# Patient Record
Sex: Female | Born: 1960 | Race: White | Hispanic: No | Marital: Married | Smoking: Current every day smoker
Health system: Southern US, Community
[De-identification: ages and names within clinical notes are randomized; demographics above are authoritative.]

## PROBLEM LIST (undated history)

## (undated) DIAGNOSIS — C50919 Malignant neoplasm of unspecified site of unspecified female breast: Secondary | ICD-10-CM

## (undated) DIAGNOSIS — F32A Depression, unspecified: Secondary | ICD-10-CM

## (undated) DIAGNOSIS — F419 Anxiety disorder, unspecified: Secondary | ICD-10-CM

## (undated) DIAGNOSIS — IMO0002 Reserved for concepts with insufficient information to code with codable children: Secondary | ICD-10-CM

## (undated) DIAGNOSIS — F329 Major depressive disorder, single episode, unspecified: Secondary | ICD-10-CM

## (undated) DIAGNOSIS — M1990 Unspecified osteoarthritis, unspecified site: Secondary | ICD-10-CM

## (undated) DIAGNOSIS — E785 Hyperlipidemia, unspecified: Secondary | ICD-10-CM

## (undated) DIAGNOSIS — I10 Essential (primary) hypertension: Secondary | ICD-10-CM

## (undated) DIAGNOSIS — B019 Varicella without complication: Secondary | ICD-10-CM

## (undated) DIAGNOSIS — G4700 Insomnia, unspecified: Secondary | ICD-10-CM

## (undated) DIAGNOSIS — E282 Polycystic ovarian syndrome: Secondary | ICD-10-CM

## (undated) DIAGNOSIS — M8970 Major osseous defect, unspecified site: Secondary | ICD-10-CM

## (undated) DIAGNOSIS — G039 Meningitis, unspecified: Secondary | ICD-10-CM

## (undated) HISTORY — DX: Depression, unspecified: F32.A

## (undated) HISTORY — DX: Hyperlipidemia, unspecified: E78.5

## (undated) HISTORY — DX: Unspecified osteoarthritis, unspecified site: M19.90

## (undated) HISTORY — DX: Anxiety disorder, unspecified: F41.9

## (undated) HISTORY — DX: Insomnia, unspecified: G47.00

## (undated) HISTORY — DX: Meningitis, unspecified: G03.9

## (undated) HISTORY — DX: Polycystic ovarian syndrome: E28.2

## (undated) HISTORY — DX: Varicella without complication: B01.9

## (undated) HISTORY — DX: Essential (primary) hypertension: I10

## (undated) HISTORY — DX: Major osseous defect, unspecified site: M89.70

## (undated) HISTORY — DX: Reserved for concepts with insufficient information to code with codable children: IMO0002

## (undated) HISTORY — DX: Major depressive disorder, single episode, unspecified: F32.9

## (undated) HISTORY — DX: Malignant neoplasm of unspecified site of unspecified female breast: C50.919

---
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
## 1988-06-09 HISTORY — PX: DILATION AND EVACUATION: SHX1459

## 2003-06-10 HISTORY — PX: OTHER SURGICAL HISTORY: SHX169

## 2010-09-16 ENCOUNTER — Encounter (HOSPITAL_BASED_OUTPATIENT_CLINIC_OR_DEPARTMENT_OTHER): Payer: Self-pay

## 2010-09-16 ENCOUNTER — Ambulatory Visit (HOSPITAL_BASED_OUTPATIENT_CLINIC_OR_DEPARTMENT_OTHER): Payer: 59

## 2010-10-09 ENCOUNTER — Ambulatory Visit (HOSPITAL_BASED_OUTPATIENT_CLINIC_OR_DEPARTMENT_OTHER): Payer: 59

## 2011-05-29 ENCOUNTER — Ambulatory Visit
Admission: RE | Admit: 2011-05-29 | Discharge: 2011-05-29 | Disposition: A | Discharge status: Home / Self Care | Payer: 59 | Source: Ambulatory Visit | Attending: Family Medicine | Admitting: Family Medicine

## 2011-05-29 ENCOUNTER — Other Ambulatory Visit: Payer: Self-pay | Admitting: Family Medicine

## 2011-05-29 DIAGNOSIS — M25552 Pain in left hip: Secondary | ICD-10-CM

## 2011-06-25 ENCOUNTER — Other Ambulatory Visit (HOSPITAL_COMMUNITY): Payer: Self-pay | Admitting: Specialist

## 2011-06-25 DIAGNOSIS — M545 Low back pain, unspecified: Secondary | ICD-10-CM

## 2011-07-02 ENCOUNTER — Ambulatory Visit (HOSPITAL_COMMUNITY)
Admission: RE | Admit: 2011-07-02 | Discharge: 2011-07-02 | Disposition: A | Discharge status: Home / Self Care | Payer: 59 | Source: Ambulatory Visit | Attending: Specialist | Admitting: Specialist

## 2011-07-02 DIAGNOSIS — I621 Nontraumatic extradural hemorrhage: Secondary | ICD-10-CM | POA: Insufficient documentation

## 2011-07-02 DIAGNOSIS — M5126 Other intervertebral disc displacement, lumbar region: Secondary | ICD-10-CM | POA: Insufficient documentation

## 2011-07-02 DIAGNOSIS — M545 Low back pain: Secondary | ICD-10-CM

## 2011-07-24 ENCOUNTER — Ambulatory Visit: Payer: 59 | Attending: Specialist | Admitting: Rehabilitation

## 2011-07-24 DIAGNOSIS — M545 Low back pain, unspecified: Secondary | ICD-10-CM | POA: Insufficient documentation

## 2011-07-24 DIAGNOSIS — IMO0001 Reserved for inherently not codable concepts without codable children: Secondary | ICD-10-CM | POA: Insufficient documentation

## 2011-07-28 ENCOUNTER — Ambulatory Visit: Payer: 59 | Admitting: Physical Therapy

## 2011-07-30 ENCOUNTER — Ambulatory Visit: Payer: 59 | Admitting: Physical Therapy

## 2011-08-04 ENCOUNTER — Ambulatory Visit: Payer: 59 | Admitting: Physical Therapy

## 2011-08-06 ENCOUNTER — Ambulatory Visit: Payer: 59 | Admitting: Physical Therapy

## 2011-08-12 ENCOUNTER — Ambulatory Visit: Payer: 59 | Attending: Specialist | Admitting: Physical Therapy

## 2011-08-12 DIAGNOSIS — M2569 Stiffness of other specified joint, not elsewhere classified: Secondary | ICD-10-CM | POA: Insufficient documentation

## 2011-08-12 DIAGNOSIS — M545 Low back pain, unspecified: Secondary | ICD-10-CM | POA: Insufficient documentation

## 2011-08-12 DIAGNOSIS — IMO0001 Reserved for inherently not codable concepts without codable children: Secondary | ICD-10-CM | POA: Insufficient documentation

## 2011-08-14 ENCOUNTER — Ambulatory Visit: Payer: 59 | Admitting: Physical Therapy

## 2011-08-18 ENCOUNTER — Ambulatory Visit: Payer: 59 | Admitting: Physical Therapy

## 2011-08-21 ENCOUNTER — Ambulatory Visit: Payer: 59 | Admitting: Physical Therapy

## 2011-08-26 ENCOUNTER — Ambulatory Visit: Payer: 59 | Admitting: Physical Therapy

## 2011-08-28 ENCOUNTER — Ambulatory Visit: Payer: 59 | Admitting: Physical Therapy

## 2011-12-12 ENCOUNTER — Ambulatory Visit: Payer: 59 | Admitting: Family Medicine

## 2011-12-29 ENCOUNTER — Ambulatory Visit (INDEPENDENT_AMBULATORY_CARE_PROVIDER_SITE_OTHER): Payer: 59 | Admitting: Family Medicine

## 2011-12-29 ENCOUNTER — Encounter: Payer: Self-pay | Admitting: Family Medicine

## 2011-12-29 VITALS — BP 134/80 | HR 105 | Temp 98.7°F | Ht 66.0 in | Wt 283.2 lb

## 2011-12-29 DIAGNOSIS — Z720 Tobacco use: Secondary | ICD-10-CM

## 2011-12-29 DIAGNOSIS — E282 Polycystic ovarian syndrome: Secondary | ICD-10-CM | POA: Insufficient documentation

## 2011-12-29 DIAGNOSIS — F341 Dysthymic disorder: Secondary | ICD-10-CM

## 2011-12-29 DIAGNOSIS — F418 Other specified anxiety disorders: Secondary | ICD-10-CM

## 2011-12-29 DIAGNOSIS — F17200 Nicotine dependence, unspecified, uncomplicated: Secondary | ICD-10-CM

## 2011-12-29 DIAGNOSIS — L409 Psoriasis, unspecified: Secondary | ICD-10-CM | POA: Insufficient documentation

## 2011-12-29 DIAGNOSIS — E119 Type 2 diabetes mellitus without complications: Secondary | ICD-10-CM | POA: Insufficient documentation

## 2011-12-29 DIAGNOSIS — E785 Hyperlipidemia, unspecified: Secondary | ICD-10-CM

## 2011-12-29 DIAGNOSIS — L408 Other psoriasis: Secondary | ICD-10-CM

## 2011-12-29 DIAGNOSIS — I10 Essential (primary) hypertension: Secondary | ICD-10-CM | POA: Insufficient documentation

## 2011-12-29 DIAGNOSIS — G988 Other disorders of nervous system: Secondary | ICD-10-CM

## 2011-12-29 MED ORDER — SPIRONOLACTONE 25 MG PO TABS: 25.0000 mg | ORAL_TABLET | Freq: Every day | ORAL | Status: DC

## 2011-12-29 MED ORDER — METFORMIN HCL 500 MG PO TABS: 500.0000 mg | ORAL_TABLET | Freq: Two times a day (BID) | ORAL | Status: DC

## 2011-12-29 MED ORDER — LISINOPRIL 5 MG PO TABS: 5.0000 mg | ORAL_TABLET | Freq: Every day | ORAL | Status: DC

## 2011-12-29 MED ORDER — MELOXICAM 7.5 MG PO TABS: 7.5000 mg | ORAL_TABLET | ORAL | Status: DC | PRN

## 2011-12-29 NOTE — Patient Instructions (Addendum)
Schedule your complete physical in 3-4 months We'll notify you of your lab results and make any changes if needed Continue the Lisinopril at 2.5 mg daily Call and schedule your eye exam We'll notify you of your dermatology appt Call with any questions or concerns Think of [place] as your home base Welcome!!  We're glad to have you!

## 2011-12-29 NOTE — Progress Notes (Signed)
  Subjective:    Patient ID: Lisa [name], female    DOB: 1960-09-04, 51 y.o.   MRN: [id number]  HPI New to establish.  Previous MD- Dr Remi Haggard, [facility]), Dr Cyndia Bent.  Overdue on pap and mammo.  DM- chronic problem, A1C typically runs 6.6, fasting CBGs ~130.  On Metformin 1000mg  QHS.  Getting yearly eye exams, 'all have been normal'.  Denies symptomatic lows.  No CP, SOB, N/V/D.  Not exercising.  HTN- chronic problem, on Lisinopril and Spironolactone (also for facial hair).  dx'd in 2003.  BP is labile.  Was having episodes of hypotension on the 5 mg of Lisinopril.  Decreased to 2.5mg  daily.  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  Labs last checked 6-9 months ago.  Depression/anxiety- chronic problem, depression 'all my life'.  Anxiety just started when she moved from [place].  Is following w/ the [facility] in [place] and is on Cymbalta and Deplin.  Is rarely taking Xanax.  + insomnia.  Alternating ambien and temazepam.  Psoriasis- previously saw derm in [place], 'it never really panned out'.  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  Using OTC topical products.  Would like to see Derm locally.  Tobacco abuse- chronic problem, has quit twice while pregnant but still smoking heavily.  Failed Chantix and Wellbutrin.  Considering attending a clinic in [place].  Abnormal mole- pt reports shape has recently changed which has her concerned.   Review of Systems For ROS see HPI     Objective:   Physical Exam  Vitals reviewed. Constitutional: She is oriented to person, place, and time. She appears well-developed and well-nourished. No distress.  HENT:  Head: Normocephalic and atraumatic.  Eyes: Conjunctivae and EOM are normal. Pupils are equal, round, and reactive to light.  Neck: Normal range of motion. Neck supple. No thyromegaly present.    Cardiovascular: Normal rate, regular rhythm, normal heart sounds and intact distal pulses.   No murmur heard. Pulmonary/Chest: Effort normal and breath sounds normal. No respiratory distress.  Abdominal: Soft. She exhibits no distension. There is no tenderness.  Musculoskeletal: She exhibits no edema.  Lymphadenopathy:    She has no cervical adenopathy.  Neurological: She is alert and oriented to person, place, and time.  Skin: Skin is warm and dry. Rash (plaque psoriasis w/ excoriations on arms and legs) noted.  Psychiatric: She has a normal mood and affect. Her behavior is normal.          Assessment & Plan:

## 2011-12-30 ENCOUNTER — Telehealth: Payer: Self-pay | Admitting: *Deleted

## 2011-12-30 ENCOUNTER — Encounter: Payer: Self-pay | Admitting: Family Medicine

## 2011-12-30 DIAGNOSIS — Z720 Tobacco use: Secondary | ICD-10-CM | POA: Insufficient documentation

## 2011-12-30 LAB — LIPID PANEL
Cholesterol: 227 mg/dL — ABNORMAL HIGH (ref 0–200)
HDL: 33.6 mg/dL — ABNORMAL LOW (ref >39.00)

## 2011-12-30 LAB — HEPATIC FUNCTION PANEL
AST: 23 U/L (ref 0–37)
Albumin: 4 g/dL (ref 3.5–5.2)
Total Bilirubin: 0.3 mg/dL (ref 0.3–1.2)

## 2011-12-30 LAB — CBC WITH DIFFERENTIAL/PLATELET
Basophils Relative: 0.2 % (ref 0.0–3.0)
Eosinophils Absolute: 0.1 10*3/uL (ref 0.0–0.7)
Eosinophils Relative: 1.4 % (ref 0.0–5.0)
HCT: 43.2 % (ref 36.0–46.0)
Hemoglobin: 14.7 g/dL (ref 12.0–15.0)
Lymphs Abs: 2.1 10*3/uL (ref 0.7–4.0)
MCHC: 33.9 g/dL (ref 30.0–36.0)
MCV: 94.5 fl (ref 78.0–100.0)
Monocytes Absolute: 0.7 10*3/uL (ref 0.1–1.0)
Neutro Abs: 5.5 10*3/uL (ref 1.4–7.7)
RBC: 4.57 Mil/uL (ref 3.87–5.11)
WBC: 8.4 10*3/uL (ref 4.5–10.5)

## 2011-12-30 LAB — TSH: TSH: 1.12 u[IU]/mL (ref 0.35–5.50)

## 2011-12-30 LAB — BASIC METABOLIC PANEL
BUN: 13 mg/dL (ref 6–23)
Calcium: 9.2 mg/dL (ref 8.4–10.5)
Creatinine, Ser: 0.6 mg/dL (ref 0.4–1.2)
GFR: 109.63 mL/min (ref >60.00)
Glucose, Bld: 218 mg/dL — ABNORMAL HIGH (ref 70–99)
Potassium: 3.8 mEq/L (ref 3.5–5.1)

## 2011-12-30 NOTE — Assessment & Plan Note (Signed)
New to provider.  Following w/ clinic in [place].  Feels sxs are fairly well controlled.  Will follow along and assist as able.

## 2011-12-30 NOTE — Assessment & Plan Note (Signed)
New to provider.  Chronic for pt.  On metformin and spironolactone.

## 2011-12-30 NOTE — Assessment & Plan Note (Signed)
New to provider.  Chronic for pt.  Due to temporal bone defects.  At high risk for meningitis.  When ill requires early abx tx.

## 2011-12-30 NOTE — Assessment & Plan Note (Signed)
New to provider, chronic for pt.  Slightly elevated today but pt anxious about meeting new MD.  Was having episodes of hypotension on 5 mg.  Will follow.

## 2011-12-30 NOTE — Telephone Encounter (Signed)
Pt seen in office on yesterday and states that Dr Beverely Low was suppose to give her a name of a eye doctor..Please advise

## 2011-12-30 NOTE — Telephone Encounter (Signed)
Returned pt call to advise, pt understood

## 2011-12-30 NOTE — Telephone Encounter (Signed)
Dr Hazle Quant or Dr Dione Booze

## 2011-12-30 NOTE — Assessment & Plan Note (Signed)
New to provider.  Chronic problem for pt.  Reports hx of intolerance to statins.  Check labs.  Consider Zetia or Welchol depending on labs.

## 2011-12-30 NOTE — Assessment & Plan Note (Signed)
New to provider.  Chronic for pt.  Stressed importance of quitting.  Pt considering going to clinic in [place] to assist w/ this.

## 2011-12-30 NOTE — Assessment & Plan Note (Signed)
New to provider.  Chronic problem for pt.  On metformin- tolerating w/out difficulty.  Not exercising or following ADA diet.  Stressed importance of both.  Pt plans on scheduling eye exam.  Check labs and adjust meds prn.

## 2011-12-30 NOTE — Assessment & Plan Note (Signed)
New to provider, chronic for pt.  Will refer to derm as pt's sxs are severe.

## 2012-01-02 ENCOUNTER — Encounter: Payer: Self-pay | Admitting: *Deleted

## 2012-01-02 MED ORDER — METFORMIN HCL 1000 MG PO TABS: 1000.0000 mg | ORAL_TABLET | Freq: Two times a day (BID) | ORAL | Status: DC

## 2012-01-02 MED ORDER — FENOFIBRATE 160 MG PO TABS: 160.0000 mg | ORAL_TABLET | Freq: Every day | ORAL | Status: DC

## 2012-01-02 NOTE — Addendum Note (Signed)
Addended by: Derry Lory A on: 01/02/2012 11:56 AM   Modules accepted: Orders

## 2012-01-26 ENCOUNTER — Encounter: Payer: Self-pay | Admitting: Family Medicine

## 2012-02-02 ENCOUNTER — Telehealth: Payer: Self-pay | Admitting: Family Medicine

## 2012-02-02 NOTE — Telephone Encounter (Signed)
In reference to Dermatology referral entered on 12/29/11, per my call to patient in July, she would call [facility] & schedule her own appointment.  I have since contacted [facility] several times, and mailed patient a letter.  As of today, no appointment made with dermatology, and no response from patient.

## 2012-02-02 NOTE — Telephone Encounter (Signed)
Noted. Thanks for your efforts.

## 2012-03-05 ENCOUNTER — Encounter: Payer: Self-pay | Admitting: Family Medicine

## 2012-03-05 ENCOUNTER — Ambulatory Visit (INDEPENDENT_AMBULATORY_CARE_PROVIDER_SITE_OTHER): Payer: 59 | Admitting: Family Medicine

## 2012-03-05 ENCOUNTER — Other Ambulatory Visit: Payer: Self-pay | Admitting: Family Medicine

## 2012-03-05 VITALS — BP 132/80 | HR 90 | Temp 98.4°F | Ht 65.0 in | Wt 284.3 lb

## 2012-03-05 DIAGNOSIS — I10 Essential (primary) hypertension: Secondary | ICD-10-CM

## 2012-03-05 DIAGNOSIS — E785 Hyperlipidemia, unspecified: Secondary | ICD-10-CM

## 2012-03-05 DIAGNOSIS — R059 Cough, unspecified: Secondary | ICD-10-CM | POA: Insufficient documentation

## 2012-03-05 DIAGNOSIS — E119 Type 2 diabetes mellitus without complications: Secondary | ICD-10-CM

## 2012-03-05 DIAGNOSIS — R05 Cough: Secondary | ICD-10-CM

## 2012-03-05 LAB — CBC WITH DIFFERENTIAL/PLATELET
Basophils Absolute: 0 10*3/uL (ref 0.0–0.1)
Eosinophils Relative: 1 % (ref 0.0–5.0)
HCT: 43.9 % (ref 36.0–46.0)
Hemoglobin: 14.7 g/dL (ref 12.0–15.0)
Lymphs Abs: 2.1 10*3/uL (ref 0.7–4.0)
MCV: 93.6 fl (ref 78.0–100.0)
Monocytes Absolute: 0.8 10*3/uL (ref 0.1–1.0)
Monocytes Relative: 9.1 % (ref 3.0–12.0)
Neutro Abs: 5.3 10*3/uL (ref 1.4–7.7)
RDW: 13.8 % (ref 11.5–14.6)

## 2012-03-05 LAB — HEMOGLOBIN A1C: Hgb A1c MFr Bld: 7.3 % — ABNORMAL HIGH (ref 4.6–6.5)

## 2012-03-05 LAB — BASIC METABOLIC PANEL
CO2: 28 mEq/L (ref 19–32)
Calcium: 9.6 mg/dL (ref 8.4–10.5)
Creatinine, Ser: 0.7 mg/dL (ref 0.4–1.2)
Glucose, Bld: 106 mg/dL — ABNORMAL HIGH (ref 70–99)

## 2012-03-05 MED ORDER — METFORMIN HCL 1000 MG PO TABS: 1000.0000 mg | ORAL_TABLET | Freq: Two times a day (BID) | ORAL | Status: DC

## 2012-03-05 MED ORDER — GUAIFENESIN-CODEINE 100-10 MG/5ML PO SYRP: 10.0000 mL | ORAL_SOLUTION | Freq: Three times a day (TID) | ORAL | Status: DC | PRN

## 2012-03-05 NOTE — Telephone Encounter (Signed)
Pt would like refill sent into [facility]  Metformin HCL 1,000mg  tablet

## 2012-03-05 NOTE — Progress Notes (Signed)
  Subjective:    Patient ID: Lisa [name], female    DOB: 10-23-1960, 51 y.o.   MRN: [id number]  HPI HTN- chronic problem, adequate control on lisinopril, aldactone.  No CP, SOB, edema, N/V.  + HA due to CSF.  Hyperlipidemia- was started on Fenofibrate in July but pt reports this caused her to 'start leaking' (temporal bone defect).  Stopped meds.  DM- chronic problem, was not well controlled at last check, was increased to 1000mg  BID.  Has been taking 2000mg  BID.  Has not been checking sugars.  No symptomatic lows but 'i feel like crap.  Tired all the time'.    Cough/wheezing- pt continues to smoke, interested in quitting.  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  sxs started 1 week ago.  No fever.  Cough is worse at night.   Review of Systems For ROS see HPI     Objective:   Physical Exam  Vitals reviewed. Constitutional: She is oriented to person, place, and time. She appears well-developed and well-nourished. No distress.  HENT:  Head: Normocephalic and atraumatic.  Eyes: Conjunctivae normal and EOM are normal. Pupils are equal, round, and reactive to light.  Neck: Normal range of motion. Neck supple. No thyromegaly present.  Cardiovascular: Normal rate, regular rhythm, normal heart sounds and intact distal pulses.   No murmur heard. Pulmonary/Chest: Effort normal and breath sounds normal. No respiratory distress.  Abdominal: Soft. She exhibits no distension. There is no tenderness.  Musculoskeletal: She exhibits no edema.  Lymphadenopathy:    She has no cervical adenopathy.  Neurological: She is alert and oriented to person, place, and time.  Skin: Skin is warm and dry.  Psychiatric: She has a normal mood and affect. Her behavior is normal.          Assessment & Plan:

## 2012-03-05 NOTE — Telephone Encounter (Signed)
rx sent to pharmacy by e-script  

## 2012-03-05 NOTE — Patient Instructions (Addendum)
Schedule your complete physical in 3 months We'll notify you of your lab results and make any changes if needed Use the cough syrup- particularly at night- as needed If the cough worsens, please call so we can start antibiotics Call with any questions or concerns Hang in there!

## 2012-03-06 NOTE — Assessment & Plan Note (Signed)
New.  Suspect this is due to CSF leak and drainage down back of throat.  No evidence of infxn.  Start cough meds prn.  Reviewed supportive care and red flags that should prompt return.  Pt expressed understanding and is in agreement w/ plan.

## 2012-03-06 NOTE — Assessment & Plan Note (Signed)
Chronic problem, adequate control.  Asymptomatic w/ exception of HAs (which are due to low CSF).  No changes at this time.

## 2012-03-06 NOTE — Assessment & Plan Note (Signed)
Chronic problem.  Has been intolerant to statins and now fenofibrate b/c she reports this causes her CSF to leak.  Not sure how to proceed at this point- may refer to lipid clinic

## 2012-03-06 NOTE — Assessment & Plan Note (Signed)
Chronic problem.  Has not been taking meds properly but not checking sugars to know what this has done to her levels.  Check renal fxn due to excessive metformin.  Encouraged healthy diet- she states she is unable to exercise due to temporal bone defects.  Will check labs and determine what med changes are required.

## 2012-03-09 ENCOUNTER — Encounter: Payer: Self-pay | Admitting: *Deleted

## 2012-04-28 ENCOUNTER — Telehealth: Payer: Self-pay | Admitting: Family Medicine

## 2012-04-28 MED ORDER — LISINOPRIL 5 MG PO TABS: 5.0000 mg | ORAL_TABLET | Freq: Every day | ORAL | Status: DC

## 2012-04-28 NOTE — Telephone Encounter (Signed)
Refill: Lisinopril 5 mg tablet. Take 1 tablet by mouth daily. Qty 90. Last fill 12-29-11

## 2012-04-28 NOTE — Telephone Encounter (Signed)
Rx sent.    MW 

## 2012-05-19 ENCOUNTER — Other Ambulatory Visit (HOSPITAL_COMMUNITY)
Admission: RE | Admit: 2012-05-19 | Discharge: 2012-05-19 | Disposition: A | Discharge status: Home / Self Care | Payer: 59 | Source: Ambulatory Visit | Attending: Family Medicine | Admitting: Family Medicine

## 2012-05-19 ENCOUNTER — Ambulatory Visit (INDEPENDENT_AMBULATORY_CARE_PROVIDER_SITE_OTHER): Payer: 59 | Admitting: Family Medicine

## 2012-05-19 ENCOUNTER — Encounter: Payer: Self-pay | Admitting: Family Medicine

## 2012-05-19 VITALS — BP 120/70 | HR 89 | Temp 98.0°F | Ht 65.5 in | Wt 290.8 lb

## 2012-05-19 DIAGNOSIS — Z1211 Encounter for screening for malignant neoplasm of colon: Secondary | ICD-10-CM

## 2012-05-19 DIAGNOSIS — E785 Hyperlipidemia, unspecified: Secondary | ICD-10-CM

## 2012-05-19 DIAGNOSIS — Z1231 Encounter for screening mammogram for malignant neoplasm of breast: Secondary | ICD-10-CM

## 2012-05-19 DIAGNOSIS — E119 Type 2 diabetes mellitus without complications: Secondary | ICD-10-CM

## 2012-05-19 DIAGNOSIS — Z01419 Encounter for gynecological examination (general) (routine) without abnormal findings: Secondary | ICD-10-CM | POA: Insufficient documentation

## 2012-05-19 DIAGNOSIS — Z124 Encounter for screening for malignant neoplasm of cervix: Secondary | ICD-10-CM

## 2012-05-19 DIAGNOSIS — Z1239 Encounter for other screening for malignant neoplasm of breast: Secondary | ICD-10-CM

## 2012-05-19 LAB — HEPATIC FUNCTION PANEL
ALT: 28 U/L (ref 0–35)
AST: 27 U/L (ref 0–37)
Bilirubin, Direct: 0 mg/dL (ref 0.0–0.3)
Total Bilirubin: 0.3 mg/dL (ref 0.3–1.2)

## 2012-05-19 LAB — CBC WITH DIFFERENTIAL/PLATELET
Basophils Absolute: 0 10*3/uL (ref 0.0–0.1)
Eosinophils Absolute: 0.1 10*3/uL (ref 0.0–0.7)
Lymphocytes Relative: 24.1 % (ref 12.0–46.0)
MCHC: 34.8 g/dL (ref 30.0–36.0)
Neutrophils Relative %: 65.5 % (ref 43.0–77.0)
Platelets: 292 10*3/uL (ref 150.0–400.0)
RDW: 13.8 % (ref 11.5–14.6)

## 2012-05-19 LAB — LIPID PANEL
Cholesterol: 188 mg/dL (ref 0–200)
Total CHOL/HDL Ratio: 6
VLDL: 57 mg/dL — ABNORMAL HIGH (ref 0.0–40.0)

## 2012-05-19 LAB — BASIC METABOLIC PANEL
Chloride: 100 mEq/L (ref 96–112)
Creatinine, Ser: 0.7 mg/dL (ref 0.4–1.2)
Potassium: 3.8 mEq/L (ref 3.5–5.1)
Sodium: 134 mEq/L — ABNORMAL LOW (ref 135–145)

## 2012-05-19 NOTE — Assessment & Plan Note (Signed)
Pap collected. 

## 2012-05-19 NOTE — Patient Instructions (Addendum)
Follow up in 3 months to recheck diabetes We'll notify you of your lab results and make any changes if needed STOP SMOKING! Try and get regular exercise and make healthy food choices We'll call you with your mammo and GI appts Call with any questions or concerns Happy Bel[birth year] Birthday! Happy Holidays!!!

## 2012-05-19 NOTE — Assessment & Plan Note (Signed)
Pt's PE WNL w/ exception of obesity.  No growth noted on tongue- no blood drainage or breast mass noted.  Overdue for mammo, colonoscopy- refer for both.  Check labs.  Anticipatory guidance provided.

## 2012-05-19 NOTE — Progress Notes (Signed)
  Subjective:    Patient ID: Lisa [name], female    DOB: 1960/08/01, 51 y.o.   MRN: [id number]  HPI CPE- overdue on mammo.  Had bloody drainage from R nipple prior to last period in November.  No pain in breast.  Has never had colonoscopy- needs referral.   Review of Systems Patient reports no vision/ hearing changes, adenopathy,fever, weight change,  persistant/recurrent hoarseness , swallowing issues, chest pain, palpitations, edema, persistant/recurrent cough, hemoptysis, dyspnea (rest/exertional/paroxysmal nocturnal), gastrointestinal bleeding (melena, rectal bleeding), abdominal pain, significant heartburn, bowel changes, GU symptoms (dysuria, hematuria, incontinence), Gyn symptoms (abnormal  bleeding, pain),  syncope, focal weakness, memory loss, numbness & tingling, skin/hair/nail changes, abnormal bruising or bleeding, anxiety, or depression.     Objective:   Physical Exam  General Appearance:    Alert, cooperative, no distress, smells of cigarettes, obese  Head:    Normocephalic, without obvious abnormality, atraumatic  Eyes:    PERRL, conjunctiva/corneas clear, EOM's intact, fundi    benign, both eyes  Ears:    Normal TM's and external ear canals, both ears  Nose:   Nares normal, septum midline, mucosa normal, no drainage    or sinus tenderness  Throat:   Lips, mucosa, and tongue normal; teeth and gums normal  Neck:   Supple, symmetrical, trachea midline, no adenopathy;    Thyroid: no enlargement/tenderness/nodules  Back:     Symmetric, no curvature, ROM normal, no CVA tenderness  Lungs:     Clear to auscultation bilaterally, respirations unlabored  Chest Wall:    No tenderness or deformity   Heart:    Regular rate and rhythm, S1 and S2 normal, no murmur, rub   or gallop  Breast Exam:    No tenderness, masses, or nipple abnormality.  No bloody d/c noted  Abdomen:     Soft, non-tender, bowel sounds active all four quadrants,    no masses, no organomegaly  Genitalia:     External genitalia normal, cervix normal in appearance, no CMT, uterus in normal size and position, adnexa w/out mass or tenderness, mucosa pink and moist, no lesions or discharge present  Rectal:    Normal external appearance  Extremities:   Extremities normal, atraumatic, no cyanosis or edema  Pulses:   2+ and symmetric all extremities  Skin:   Skin color, texture, turgor normal, no rashes or lesions  Lymph nodes:   Cervical, supraclavicular, and axillary nodes normal  Neurologic:   CNII-XII intact, normal strength, sensation and reflexes    throughout          Assessment & Plan:

## 2012-05-19 NOTE — Assessment & Plan Note (Addendum)
Chronic problem.  Not currently on meds.  Check labs.  start meds prn

## 2012-05-19 NOTE — Assessment & Plan Note (Signed)
Chronic problem for pt.  A1C 6.9 today.  No med changes.  Encouraged healthy diet, regular exercise.

## 2012-05-21 ENCOUNTER — Encounter: Payer: Self-pay | Admitting: Internal Medicine

## 2012-05-24 ENCOUNTER — Encounter: Payer: Self-pay | Admitting: *Deleted

## 2012-06-10 ENCOUNTER — Ambulatory Visit (HOSPITAL_COMMUNITY)
Admission: RE | Admit: 2012-06-10 | Discharge: 2012-06-10 | Disposition: A | Discharge status: Home / Self Care | Payer: 59 | Source: Ambulatory Visit | Attending: Family Medicine | Admitting: Family Medicine

## 2012-06-10 DIAGNOSIS — Z1231 Encounter for screening mammogram for malignant neoplasm of breast: Secondary | ICD-10-CM | POA: Insufficient documentation

## 2012-06-10 DIAGNOSIS — Z1239 Encounter for other screening for malignant neoplasm of breast: Secondary | ICD-10-CM

## 2012-06-15 ENCOUNTER — Telehealth: Payer: Self-pay | Admitting: Family Medicine

## 2012-06-15 NOTE — Telephone Encounter (Signed)
Refill: Metformin hcl 1,000mg  tab. Take 1 tablet by mouth 2 times daily with a meal. Qty 180. Last fill 9.27.13

## 2012-06-16 MED ORDER — METFORMIN HCL 1000 MG PO TABS: 1000.0000 mg | ORAL_TABLET | Freq: Two times a day (BID) | ORAL | Status: DC

## 2012-06-16 NOTE — Telephone Encounter (Signed)
Rx sent to the pharmacy by e-script.//AB/CMA 

## 2012-06-18 ENCOUNTER — Telehealth: Payer: Self-pay | Admitting: Family Medicine

## 2012-06-18 ENCOUNTER — Ambulatory Visit (AMBULATORY_SURGERY_CENTER): Payer: 59 | Admitting: *Deleted

## 2012-06-18 VITALS — Ht 66.0 in | Wt 288.0 lb

## 2012-06-18 DIAGNOSIS — Z1211 Encounter for screening for malignant neoplasm of colon: Secondary | ICD-10-CM

## 2012-06-18 MED ORDER — MOVIPREP 100 G PO SOLR: ORAL | Status: DC

## 2012-06-18 MED ORDER — SPIRONOLACTONE 25 MG PO TABS: 25.0000 mg | ORAL_TABLET | Freq: Every day | ORAL | Status: DC

## 2012-06-18 NOTE — Telephone Encounter (Signed)
Refill: spironolactone 25 mg tablet. Take 1 tablet by mouth daily. Qty 90. Last fill 12-29-11

## 2012-06-18 NOTE — Telephone Encounter (Signed)
Refill done.  

## 2012-06-24 ENCOUNTER — Other Ambulatory Visit: Payer: Self-pay | Admitting: Family Medicine

## 2012-06-24 DIAGNOSIS — R928 Other abnormal and inconclusive findings on diagnostic imaging of breast: Secondary | ICD-10-CM

## 2012-07-01 ENCOUNTER — Ambulatory Visit (AMBULATORY_SURGERY_CENTER): Payer: 59 | Admitting: Internal Medicine

## 2012-07-01 ENCOUNTER — Encounter: Payer: Self-pay | Admitting: Internal Medicine

## 2012-07-01 VITALS — BP 122/75 | HR 77 | Temp 97.9°F | Resp 26 | Ht 66.0 in | Wt 288.0 lb

## 2012-07-01 DIAGNOSIS — D126 Benign neoplasm of colon, unspecified: Secondary | ICD-10-CM

## 2012-07-01 DIAGNOSIS — Z1211 Encounter for screening for malignant neoplasm of colon: Secondary | ICD-10-CM

## 2012-07-01 MED ORDER — SODIUM CHLORIDE 0.9 % IV SOLN: 500.0000 mL | INTRAVENOUS | Status: DC

## 2012-07-01 NOTE — Progress Notes (Signed)
Called to room to assist during endoscopic procedure.  Patient ID and intended procedure confirmed with present staff. Received instructions for my participation in the procedure from the performing physician.  

## 2012-07-01 NOTE — Progress Notes (Signed)
Lidocaine-40mg IV prior to Propofol InductionPropofol given over incremental dosages 

## 2012-07-01 NOTE — Progress Notes (Signed)
Patient did not experience any of the following events: a burn prior to discharge; a fall within the facility; wrong site/side/patient/procedure/implant event; or a hospital transfer or hospital admission upon discharge from the facility. (G8907) Patient did not have preoperative order for IV antibiotic SSI prophylaxis. (G8918)  

## 2012-07-01 NOTE — Patient Instructions (Addendum)

## 2012-07-01 NOTE — Op Note (Signed)
 [facility] [address] [place], [address]   COLONOSCOPY PROCEDURE REPORT  PATIENT: Lisa [name], Lisa [name]  MR#: [id number] BIRTHDATE: [birth year] , 51  yrs. old GENDER: Female ENDOSCOPIST: Beverley Fiedler, MD REFERRED [id number], Natalia Leatherwood PROCEDURE DATE:  07/01/2012 PROCEDURE:   Colonoscopy with snare polypectomy ASA CLASS:   Class III INDICATIONS:average risk screening and first colonoscopy. MEDICATIONS: MAC sedation, administered by CRNA and propofol (Diprivan) 500mg  IV  DESCRIPTION OF PROCEDURE:   After the risks benefits and alternatives of the procedure were thoroughly explained, informed consent was obtained.  A digital rectal exam revealed no rectal mass.   The LB CF-[id number]  endoscope was introduced through the anus and advanced to the cecum, which was identified by both the appendix and ileocecal valve. No adverse events experienced. The quality of the prep was Moviprep fair  The instrument was then slowly withdrawn as the colon was fully examined.   COLON FINDINGS: There was moderate diverticulosis noted in the ascending colon, transverse colon, descending colon, and sigmoid colon with associated muscular hypertrophy.   Four sessile polyps measuring 4-8 mm in size were found in the ascending colon, transverse colon, and sigmoid colon.  Polypectomy was performed using cold snare.  All resections were complete and all polyp tissue was completely retrieved.  Retroflexed views revealed no abnormalities. The time to cecum=15 minutes 56 seconds.  Withdrawal time=10 minutes 04 seconds.  The scope was withdrawn and the procedure completed. COMPLICATIONS: There were no complications.  ENDOSCOPIC IMPRESSION: 1.   There was moderate diverticulosis noted in the ascending colon, transverse colon, descending colon, and sigmoid colon 2.   Four sessile polyps measuring 4-8 mm in size were found in the ascending colon, transverse colon, and sigmoid colon;  Polypectomy was performed using cold snare  RECOMMENDATIONS: 1.  Hold aspirin, aspirin products, and anti-inflammatory medication for 1 week. 2.  High fiber diet 3.  If the polyps removed today are proven to be adenomatous (pre-cancerous) polyps, you will need a colonoscopy in 3 years. Otherwise you should continue to follow colorectal cancer screening guidelines for "routine risk" patients with a colonoscopy in 10 years.  You will receive a letter within 1-2 weeks with the results of your biopsy as well as final recommendations.  Please call my office if you have not received a letter after 3 weeks.   eSigned:  Beverley Fiedler, MD 07/01/2012 2:16 PM  cc: Neena Rhymes MD and The Patient

## 2012-07-02 ENCOUNTER — Telehealth: Payer: Self-pay | Admitting: *Deleted

## 2012-07-02 NOTE — Telephone Encounter (Signed)
Left message on number given in admitting yesterday. ewm 

## 2012-07-05 ENCOUNTER — Ambulatory Visit
Admission: RE | Admit: 2012-07-05 | Discharge: 2012-07-05 | Disposition: A | Discharge status: Home / Self Care | Payer: 59 | Source: Ambulatory Visit | Attending: Family Medicine | Admitting: Family Medicine

## 2012-07-05 DIAGNOSIS — R928 Other abnormal and inconclusive findings on diagnostic imaging of breast: Secondary | ICD-10-CM

## 2012-07-07 ENCOUNTER — Encounter: Payer: Self-pay | Admitting: Internal Medicine

## 2012-08-02 ENCOUNTER — Other Ambulatory Visit: Payer: Self-pay | Admitting: Family Medicine

## 2012-08-02 DIAGNOSIS — I10 Essential (primary) hypertension: Secondary | ICD-10-CM

## 2012-08-03 NOTE — Telephone Encounter (Signed)
Refill for lisinopril sent to [facility]

## 2012-09-27 ENCOUNTER — Encounter: Payer: Self-pay | Admitting: Internal Medicine

## 2012-09-27 ENCOUNTER — Ambulatory Visit (INDEPENDENT_AMBULATORY_CARE_PROVIDER_SITE_OTHER): Payer: 59 | Admitting: Internal Medicine

## 2012-09-27 VITALS — BP 122/80 | HR 100 | Temp 97.6°F | Ht 66.0 in | Wt 293.0 lb

## 2012-09-27 DIAGNOSIS — J209 Acute bronchitis, unspecified: Secondary | ICD-10-CM

## 2012-09-27 DIAGNOSIS — R05 Cough: Secondary | ICD-10-CM

## 2012-09-27 MED ORDER — ALBUTEROL SULFATE HFA 108 (90 BASE) MCG/ACT IN AERS: 2.0000 | INHALATION_SPRAY | Freq: Four times a day (QID) | RESPIRATORY_TRACT | Status: DC | PRN

## 2012-09-27 MED ORDER — LEVOFLOXACIN 500 MG PO TABS: 500.0000 mg | ORAL_TABLET | Freq: Every day | ORAL | Status: DC

## 2012-09-27 MED ORDER — HYDROCODONE-HOMATROPINE 5-1.5 MG/5ML PO SYRP: 5.0000 mL | ORAL_SOLUTION | Freq: Three times a day (TID) | ORAL | Status: DC | PRN

## 2012-09-27 NOTE — Progress Notes (Signed)
HPI  Pt presents to the clinic today with c/o cold symptoms x 1 weeks. The worst part is the sore throat and dry cough. She does not produce any sputum. She has tried Robitussin, Mucinex, cough drops and nothing seems to help. The cough is worse at night. She has not had much sleep in 3 nights. She does not have a history of allergies or asthma. She does smoke. She does have sick contacts.  Review of Systems      Past Medical History  Diagnosis Date  . Hypertension   . Diabetes mellitus   . Depression   . Arthritis   . Anxiety   . Chicken pox   . Hyperlipidemia   . Insomnia   . 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 Polycystic ovarian disease   . Bulging disc L-5    Family History  Problem Relation Age of Onset  . Arthritis Mother   . Hypertension Mother   . Arthritis Father   . Prostate cancer Father   . Heart disease Father   . Marfan syndrome Father   . Hyperlipidemia Sister   . Diabetes Sister   . Marfan syndrome Sister   . Breast cancer Maternal Grandmother   . Hypertension Maternal Grandmother   . Diabetes Paternal Grandmother     History   Social History  . Marital Status: Married    Spouse Name: N/A    Number of Children: N/A  . Years of Education: N/A   Occupational History  . Not on file.   Social History Main Topics  . Smoking status: Current Every Day Smoker -- 1.50 packs/day for 35 years  . Smokeless tobacco: Never Used  . Alcohol Use: No  . Drug Use: No  . Sexually Active: Not on file   Other Topics Concern  . Not on file   Social History Narrative  . No narrative on file    No Known Allergies   Constitutional: Positive headache, fatigue and fever. Denies abrupt weight changes.  HEENT:  Positive sore throat. Denies eye redness, eye pain, pressure behind the eyes, facial pain, nasal congestion, ear pain, ringing in the ears, wax buildup, runny nose or bloody nose. Respiratory: Positive cough. Denies  difficulty breathing or shortness of breath.  Cardiovascular: Denies chest pain, chest tightness, palpitations or swelling in the hands or feet.   No other specific complaints in a complete review of systems (except as listed in HPI above).  Objective:   BP 122/80  Pulse 100  Temp(Src) 97.6 F (36.4 C) (Oral)  Ht 5\' 6"  (1.676 m)  Wt 293 lb (132.904 kg)  BMI 47.31 kg/m2  SpO2 96% Wt Readings from Last 3 Encounters:  09/27/12 293 lb (132.904 kg)  07/01/12 288 lb (130.636 kg)  06/18/12 288 lb (130.636 kg)     General: Appears her stated age, obese but well developed, well nourished in NAD. HEENT: Head: normal shape and size; Eyes: sclera white, no icterus, conjunctiva pink, PERRLA and EOMs intact; Ears: Tm's gray and intact, normal light reflex; Nose: mucosa pink and moist, septum midline; Throat/Mouth: + PND. Teeth present, mucosa erythematous and moist, no exudate noted, no lesions or ulcerations noted.  Neck: Mild cervical lymphadenopathy. Neck supple, trachea midline. No massses, lumps or thyromegaly present.  Cardiovascular: Normal rate and rhythm. S1,S2 noted.  No murmur, rubs or gallops noted. No JVD or BLE edema. No carotid bruits noted. Pulmonary/Chest: Normal effort and scattered  ronchi in RUL. No respiratory distress. No wheezes, rales onoted.      Assessment & Plan:   Acute Bronchitis, new onset:  Get some rest and drink plenty of water Do salt water gargles for the sore throat eRx for Levaquin x 7 days eRx for abuterol inhaler eRx for hycodan cough syrup   RTC as needed or if symptoms persist.

## 2012-09-27 NOTE — Patient Instructions (Signed)

## 2012-09-29 DIAGNOSIS — Z0279 Encounter for issue of other medical certificate: Secondary | ICD-10-CM

## 2012-10-13 ENCOUNTER — Other Ambulatory Visit: Payer: Self-pay | Admitting: Family Medicine

## 2012-10-13 DIAGNOSIS — I10 Essential (primary) hypertension: Secondary | ICD-10-CM

## 2012-10-13 NOTE — Telephone Encounter (Signed)
Refill for Aldactone sent to [facility]

## 2012-10-21 ENCOUNTER — Encounter: Payer: Self-pay | Admitting: Family Medicine

## 2012-10-21 ENCOUNTER — Ambulatory Visit (INDEPENDENT_AMBULATORY_CARE_PROVIDER_SITE_OTHER): Payer: 59 | Admitting: Family Medicine

## 2012-10-21 VITALS — BP 110/70 | HR 87 | Temp 98.1°F | Ht 65.75 in | Wt 279.0 lb

## 2012-10-21 DIAGNOSIS — W57XXXA Bitten or stung by nonvenomous insect and other nonvenomous arthropods, initial encounter: Secondary | ICD-10-CM

## 2012-10-21 DIAGNOSIS — S30860A Insect bite (nonvenomous) of lower back and pelvis, initial encounter: Secondary | ICD-10-CM

## 2012-10-21 DIAGNOSIS — L0291 Cutaneous abscess, unspecified: Secondary | ICD-10-CM

## 2012-10-21 DIAGNOSIS — L0390 Cellulitis, unspecified: Secondary | ICD-10-CM | POA: Insufficient documentation

## 2012-10-21 MED ORDER — DOXYCYCLINE HYCLATE 100 MG PO TABS: 100.0000 mg | ORAL_TABLET | Freq: Two times a day (BID) | ORAL | Status: DC

## 2012-10-21 NOTE — Assessment & Plan Note (Signed)
New.  Check labs to r/o RMSF and Lyme.  Area now looks infected so will start Doxy.  Reviewed supportive care and red flags that should prompt return.  Pt expressed understanding and is in agreement w/ plan.

## 2012-10-21 NOTE — Patient Instructions (Addendum)
Schedule a diabetes and cholesterol f/u in the next month Start the Doxy twice daily- take w/ food Neosporin on the area twice daily We'll notify you of your lab results as soon as they are available Call with any questions or concerns Hang in there!

## 2012-10-21 NOTE — Progress Notes (Signed)
  Subjective:    Patient ID: Lisa [name], female    DOB: 07/29/1960, 52 y.o.   MRN: [id number]  HPI Tick bite- occurred on Saturday, tick was tiny and not engorged.  Tick was removed.  Pt reports area on back was 'really bad.  Red and swollen'.  Yesterday developed HA- not typical for pt.  No rash, no fever.  No drainage from back.   Review of Systems For ROS see HPI     Objective:   Physical Exam  Vitals reviewed. Constitutional: She appears well-developed and well-nourished. No distress.  Skin: Skin is warm and dry. There is erythema (surrounding a central tick bite w/ black/necrotic center on R lateral mid back.  no pus or induration present).          Assessment & Plan:

## 2012-10-21 NOTE — Assessment & Plan Note (Signed)
New.  Start Doxy.  Reviewed supportive care and red flags that should prompt return.  Pt expressed understanding and is in agreement w/ plan.

## 2012-10-22 LAB — ROCKY MTN SPOTTED FVR ABS PNL(IGG+IGM): RMSF IgG: 1.31 IV — ABNORMAL HIGH

## 2012-10-22 LAB — B. BURGDORFI ANTIBODIES: B burgdorferi Ab IgG+IgM: 0.18 {ISR}

## 2013-06-28 ENCOUNTER — Other Ambulatory Visit: Payer: Self-pay | Admitting: Family Medicine

## 2013-07-20 ENCOUNTER — Other Ambulatory Visit: Payer: Self-pay | Admitting: Family Medicine

## 2013-07-20 NOTE — Telephone Encounter (Signed)
Med filled.  

## 2013-08-02 ENCOUNTER — Other Ambulatory Visit (HOSPITAL_COMMUNITY): Payer: Self-pay | Admitting: Specialist

## 2013-08-02 DIAGNOSIS — M545 Low back pain, unspecified: Secondary | ICD-10-CM

## 2013-08-04 ENCOUNTER — Ambulatory Visit: Payer: 59 | Admitting: Family Medicine

## 2013-08-12 ENCOUNTER — Ambulatory Visit (HOSPITAL_COMMUNITY)
Admission: RE | Admit: 2013-08-12 | Discharge: 2013-08-12 | Disposition: A | Discharge status: Home / Self Care | Payer: 59 | Source: Ambulatory Visit | Attending: Specialist | Admitting: Specialist

## 2013-08-12 DIAGNOSIS — R209 Unspecified disturbances of skin sensation: Secondary | ICD-10-CM | POA: Insufficient documentation

## 2013-08-12 DIAGNOSIS — M5126 Other intervertebral disc displacement, lumbar region: Secondary | ICD-10-CM | POA: Insufficient documentation

## 2013-08-12 DIAGNOSIS — M25559 Pain in unspecified hip: Secondary | ICD-10-CM | POA: Insufficient documentation

## 2013-08-12 DIAGNOSIS — M545 Low back pain, unspecified: Secondary | ICD-10-CM | POA: Insufficient documentation

## 2013-08-12 DIAGNOSIS — M79609 Pain in unspecified limb: Secondary | ICD-10-CM | POA: Insufficient documentation

## 2013-08-12 DIAGNOSIS — M48061 Spinal stenosis, lumbar region without neurogenic claudication: Secondary | ICD-10-CM | POA: Insufficient documentation

## 2013-08-30 ENCOUNTER — Ambulatory Visit: Payer: 59 | Attending: Specialist | Admitting: Physical Therapy

## 2013-08-30 DIAGNOSIS — M545 Low back pain, unspecified: Secondary | ICD-10-CM | POA: Insufficient documentation

## 2013-08-30 DIAGNOSIS — IMO0001 Reserved for inherently not codable concepts without codable children: Secondary | ICD-10-CM | POA: Insufficient documentation

## 2013-09-01 ENCOUNTER — Ambulatory Visit: Payer: 59 | Admitting: Physical Therapy

## 2013-09-08 ENCOUNTER — Ambulatory Visit: Payer: 59 | Attending: Specialist | Admitting: Physical Therapy

## 2013-09-08 DIAGNOSIS — M545 Low back pain, unspecified: Secondary | ICD-10-CM | POA: Insufficient documentation

## 2013-09-08 DIAGNOSIS — IMO0001 Reserved for inherently not codable concepts without codable children: Secondary | ICD-10-CM | POA: Insufficient documentation

## 2013-09-13 ENCOUNTER — Ambulatory Visit: Payer: 59 | Admitting: Physical Therapy

## 2013-09-19 ENCOUNTER — Ambulatory Visit: Payer: 59 | Admitting: Physical Therapy

## 2013-09-20 ENCOUNTER — Ambulatory Visit: Payer: 59 | Admitting: Physical Therapy

## 2013-09-23 ENCOUNTER — Other Ambulatory Visit: Payer: Self-pay | Admitting: Family Medicine

## 2013-09-23 NOTE — Telephone Encounter (Signed)
Med filled #[phone] with 0, letter mailed to pt to inform need for Follow up for diabetes no labs checked since 2013. Cannot fill for 90 days until pt sees Tabori.

## 2013-10-21 ENCOUNTER — Ambulatory Visit: Payer: 59

## 2013-10-21 ENCOUNTER — Ambulatory Visit (INDEPENDENT_AMBULATORY_CARE_PROVIDER_SITE_OTHER): Payer: 59 | Admitting: Family Medicine

## 2013-10-21 ENCOUNTER — Encounter: Payer: Self-pay | Admitting: Family Medicine

## 2013-10-21 ENCOUNTER — Telehealth: Payer: Self-pay | Admitting: Family Medicine

## 2013-10-21 VITALS — BP 122/68 | HR 105 | Temp 98.2°F | Resp 16 | Wt 287.5 lb

## 2013-10-21 DIAGNOSIS — G988 Other disorders of nervous system: Secondary | ICD-10-CM

## 2013-10-21 DIAGNOSIS — I10 Essential (primary) hypertension: Secondary | ICD-10-CM

## 2013-10-21 DIAGNOSIS — G9600 Cerebrospinal fluid leak, unspecified: Secondary | ICD-10-CM

## 2013-10-21 DIAGNOSIS — R946 Abnormal results of thyroid function studies: Secondary | ICD-10-CM

## 2013-10-21 DIAGNOSIS — Z803 Family history of malignant neoplasm of breast: Secondary | ICD-10-CM

## 2013-10-21 DIAGNOSIS — E785 Hyperlipidemia, unspecified: Secondary | ICD-10-CM

## 2013-10-21 DIAGNOSIS — E119 Type 2 diabetes mellitus without complications: Secondary | ICD-10-CM

## 2013-10-21 LAB — LIPID PANEL
Cholesterol: 207 mg/dL — ABNORMAL HIGH (ref 0–200)
HDL: 29.1 mg/dL — ABNORMAL LOW (ref >39.00)
LDL Cholesterol: 78 mg/dL (ref 0–99)
Total CHOL/HDL Ratio: 7
Triglycerides: 502 mg/dL — ABNORMAL HIGH (ref 0.0–149.0)
VLDL: 100.4 mg/dL — ABNORMAL HIGH (ref 0.0–40.0)

## 2013-10-21 LAB — BASIC METABOLIC PANEL
BUN: 10 mg/dL (ref 6–23)
CO2: 25 meq/L (ref 19–32)
Calcium: 9.4 mg/dL (ref 8.4–10.5)
Chloride: 98 mEq/L (ref 96–112)
Creatinine, Ser: 0.7 mg/dL (ref 0.4–1.2)
GFR: 92.88 mL/min (ref >60.00)
Glucose, Bld: 171 mg/dL — ABNORMAL HIGH (ref 70–99)
POTASSIUM: 3.5 meq/L (ref 3.5–5.1)
SODIUM: 134 meq/L — AB (ref 135–145)

## 2013-10-21 LAB — CBC WITH DIFFERENTIAL/PLATELET
BASOS ABS: 0 10*3/uL (ref 0.0–0.1)
Basophils Relative: 0.3 % (ref 0.0–3.0)
EOS ABS: 0.1 10*3/uL (ref 0.0–0.7)
Eosinophils Relative: 1 % (ref 0.0–5.0)
HCT: 44.4 % (ref 36.0–46.0)
HEMOGLOBIN: 15.3 g/dL — AB (ref 12.0–15.0)
Lymphocytes Relative: 21 % (ref 12.0–46.0)
Lymphs Abs: 2 10*3/uL (ref 0.7–4.0)
MCHC: 34.6 g/dL (ref 30.0–36.0)
MCV: 92.8 fl (ref 78.0–100.0)
MONOS PCT: 8.7 % (ref 3.0–12.0)
Monocytes Absolute: 0.8 10*3/uL (ref 0.1–1.0)
NEUTROS ABS: 6.5 10*3/uL (ref 1.4–7.7)
Neutrophils Relative %: 69 % (ref 43.0–77.0)
PLATELETS: 289 10*3/uL (ref 150.0–400.0)
RBC: 4.78 Mil/uL (ref 3.87–5.11)
RDW: 13.2 % (ref 11.5–15.5)
WBC: 9.4 10*3/uL (ref 4.0–10.5)

## 2013-10-21 LAB — HEPATIC FUNCTION PANEL
ALK PHOS: 45 U/L (ref 39–117)
ALT: 30 U/L (ref 0–35)
AST: 22 U/L (ref 0–37)
Albumin: 3.9 g/dL (ref 3.5–5.2)
BILIRUBIN DIRECT: 0 mg/dL (ref 0.0–0.3)
Total Bilirubin: 0.5 mg/dL (ref 0.2–1.2)
Total Protein: 7 g/dL (ref 6.0–8.3)

## 2013-10-21 LAB — HEMOGLOBIN A1C: HEMOGLOBIN A1C: 8.4 % — AB (ref 4.6–6.5)

## 2013-10-21 LAB — T4, FREE: Free T4: 0.71 ng/dL (ref 0.60–1.60)

## 2013-10-21 LAB — T3, FREE: T3, Free: 2.6 pg/mL (ref 2.3–4.2)

## 2013-10-21 LAB — TSH: TSH: 0.26 u[IU]/mL — ABNORMAL LOW (ref 0.35–4.50)

## 2013-10-21 NOTE — Patient Instructions (Signed)
Follow up in 3-4 months to recheck diabetes We'll call you with your ENT appt and your mammo We'll notify you of your lab results and make any changes if needed Call with any questions or concerns Hang in there!

## 2013-10-21 NOTE — Progress Notes (Signed)
Pre visit review using our clinic review tool, if applicable. No additional management support is needed unless otherwise documented below in the visit note. 

## 2013-10-21 NOTE — Progress Notes (Signed)
   Subjective:    Patient ID: Lisa [name], female    DOB: 1961-01-23, 53 y.o.   MRN: [id number]  HPI HTN- chronic problem, on Lisinopril, Spironolactone.  Well controlled today.  Denies CP, SOB, HAs, visual changes, edema.     Hyperlipidemia- chronic problem, not currently on meds.  Not able to exercise due to herniated disc.  DM- chronic problem, pt has not followed up as directed.  On Metformin.  ACE for renal protection.  UTD on eye exam (April 2015).  Denies symptomatic lows.  No numbness/tingling hands/feet  Family hx breast cancer- sister dx'd recently.  Due for mammo.  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    Review of Systems For ROS see HPI     Objective:   Physical Exam  Vitals reviewed. Constitutional: She is oriented to person, place, and time. She appears well-developed and well-nourished. No distress.  Pt malodorous  HENT:  Head: Normocephalic and atraumatic.  Eyes: Conjunctivae and EOM are normal. Pupils are equal, round, and reactive to light.  Neck: Normal range of motion. Neck supple. No thyromegaly present.  Cardiovascular: Normal rate, regular rhythm, normal heart sounds and intact distal pulses.   No murmur heard. Pulmonary/Chest: Effort normal and breath sounds normal. No respiratory distress.  Abdominal: Soft. She exhibits no distension. There is no tenderness.  Musculoskeletal: She exhibits no edema.  Lymphadenopathy:    She has no cervical adenopathy.  Neurological: She is alert and oriented to person, place, and time.  Skin: Skin is warm and dry.  Psychiatric: She has a normal mood and affect. Her behavior is normal.          Assessment & Plan:

## 2013-10-21 NOTE — Assessment & Plan Note (Signed)
New.  Refer for mammo.

## 2013-10-21 NOTE — Assessment & Plan Note (Signed)
Chronic problem.  Not currently on meds.  Pt not following ADA diet or exercising.  Check labs.  Start meds prn.

## 2013-10-21 NOTE — Assessment & Plan Note (Signed)
Chronic problem.  Adequate control.  Asymptomatic.  Check labs.  No anticipated med changes 

## 2013-10-21 NOTE — Assessment & Plan Note (Signed)
Chronic problem for pt, asking for referral to Dr Thornell Mule for ongoing management.  Referral entered

## 2013-10-21 NOTE — Telephone Encounter (Signed)
Error. BC °

## 2013-10-21 NOTE — Assessment & Plan Note (Signed)
Chronic problem.  Pt has been noncompliant w/ f/u.  Reports she is compliant w/ medication.  On ACE for renal protection.  UTD on eye exam.  Check labs.  Adjust meds prn

## 2013-10-24 ENCOUNTER — Other Ambulatory Visit: Payer: Self-pay | Admitting: Family Medicine

## 2013-10-24 ENCOUNTER — Telehealth: Payer: Self-pay | Admitting: Family Medicine

## 2013-10-24 ENCOUNTER — Other Ambulatory Visit: Payer: Self-pay | Admitting: General Practice

## 2013-10-24 ENCOUNTER — Telehealth: Payer: Self-pay | Admitting: General Practice

## 2013-10-24 DIAGNOSIS — E119 Type 2 diabetes mellitus without complications: Secondary | ICD-10-CM

## 2013-10-24 DIAGNOSIS — E785 Hyperlipidemia, unspecified: Secondary | ICD-10-CM

## 2013-10-24 DIAGNOSIS — E282 Polycystic ovarian syndrome: Secondary | ICD-10-CM

## 2013-10-24 LAB — POCT URINALYSIS DIPSTICK
BILIRUBIN UA: NEGATIVE
GLUCOSE UA: NEGATIVE
Ketones, UA: NEGATIVE
LEUKOCYTES UA: NEGATIVE
NITRITE UA: NEGATIVE
Protein, UA: NEGATIVE
RBC UA: NEGATIVE
Spec Grav, UA: 1.01
UROBILINOGEN UA: 0.2
pH, UA: 6.5

## 2013-10-24 MED ORDER — SITAGLIP PHOS-METFORMIN HCL ER 100-1000 MG PO TB24: 1.0000 | ORAL_TABLET | Freq: Every day | ORAL | Status: DC

## 2013-10-24 NOTE — Telephone Encounter (Signed)
Pt called back today and advised that she did not know if she would want to go to the lipid clinic. Pt advised that no other doctors were able to treat her cholesterol, all medications that she was placed on gave her sever headaches. Pt advised that she does not like doctors due to a provider at a hospital in [place] her in surgery. Pt then started crying stating that she has a muscular degeneration and PCOS and that is why she cannot lose any weight. Pt states that she had previously gone to the gym on a daily basis and none of the weight ever came off. Pt was advised that her Triglycerides were 500 and that this was a very serious issue with her DM. Pt then started crying even harder saying that she really does not care that doctors in the hospital killed her mom because they put her in a mental ward for 72 hours without properly treating her, that is why she does not trust doctors.   Pt was asked if she had ever discussed her PCOS issues with Dr. Birdie Riddle she stated no. I asked "How can we help you treat a disorder if you do not discuss it with the provider? I then asked if she would like a referral to nutrition to discuss dietary options due to PCOS, pt advised she had been there before and it did not help. Pt was then asked if she would like a referral to a GYN to follow the PCOS, pt denied this referral. Stated that the only thing she cared about now was her depression. Pt was asked if she would like an appt with Tabori to discuss. Pt said "No, what can she do to help". Pt was advised if she changed he mind to call the office.  Pt was then upset wanting to know why the diabetes medication had not been called into her pharmacy. Pt was advised that I had done this after talking to her earlier and that I would resend the Rx to make sure that [facility]. Pt stated that "Well you made it sound important that I start that why is it not there? Tried to inform pt but she became  inconsolable at that time stating that she just does not know what to do anymore and hung up.

## 2013-10-24 NOTE — Telephone Encounter (Signed)
Based on pt's hx of non-compliance w/ f/u and her refusal to discuss her current medical issues w/ me, it seems we can no longer continue our doctor/pt relationship.  Will initiate dismissal process

## 2013-10-24 NOTE — Telephone Encounter (Signed)
Relevant patient education assigned to patient using Emmi. ° °

## 2013-10-25 ENCOUNTER — Encounter: Payer: Self-pay | Admitting: Family Medicine

## 2013-10-25 NOTE — Telephone Encounter (Signed)
We can refer to Dr Chalmers Cater for PCOS and diabetes but this will likely be the last step our office takes in regards to her care since I have initiated the dismissal process

## 2013-10-25 NOTE — Telephone Encounter (Addendum)
Spoke w/patient this morning regarding her ENT appointment and referral to lipid clinic (I had not seen this telephone encounter). Patient states she really just wants to see an endocrinologist (preferably Dr. Chalmers Cater) to treat her diabetes and PCOS. She went on to say that she does not mean to be non-compliant, she has just "been through a lot" lately. I advised patient I would relay her message.

## 2013-10-25 NOTE — Telephone Encounter (Signed)
Referral placed per Tabori  

## 2013-10-26 NOTE — Telephone Encounter (Signed)
Spoke w/pt today to make her aware of her endocrinology appt. She states she will now go to the lipid clinic if we still want her to go. She also states that she saw Dr. Lucia Gaskins today for the lump on her tongue and he gave her antibiotics.

## 2013-11-02 ENCOUNTER — Other Ambulatory Visit: Payer: Self-pay | Admitting: Family Medicine

## 2013-11-02 NOTE — Telephone Encounter (Signed)
Med filled.  

## 2013-11-03 ENCOUNTER — Ambulatory Visit (HOSPITAL_COMMUNITY): Payer: 59

## 2013-11-03 ENCOUNTER — Ambulatory Visit (HOSPITAL_COMMUNITY)
Admission: RE | Admit: 2013-11-03 | Discharge: 2013-11-03 | Disposition: A | Discharge status: Home / Self Care | Payer: 59 | Source: Ambulatory Visit | Attending: Family Medicine | Admitting: Family Medicine

## 2013-11-03 DIAGNOSIS — Z1231 Encounter for screening mammogram for malignant neoplasm of breast: Secondary | ICD-10-CM | POA: Insufficient documentation

## 2013-11-03 DIAGNOSIS — Z803 Family history of malignant neoplasm of breast: Secondary | ICD-10-CM | POA: Insufficient documentation

## 2013-11-11 ENCOUNTER — Telehealth: Payer: Self-pay | Admitting: Family Medicine

## 2013-11-11 NOTE — Telephone Encounter (Addendum)
°  Patient dismissed from [facility] by Annye Asa MD , effective Oct 25, 2013. Dismissal letter sent out by certified / registered mail. DAJ  Received signed domestic return receipt verifying delivery of certified letter on November 18, 2013. Article number [id number] [place] DAJ

## 2014-07-02 ENCOUNTER — Ambulatory Visit (HOSPITAL_BASED_OUTPATIENT_CLINIC_OR_DEPARTMENT_OTHER): Payer: 59

## 2014-09-04 ENCOUNTER — Ambulatory Visit (HOSPITAL_BASED_OUTPATIENT_CLINIC_OR_DEPARTMENT_OTHER): Payer: 59 | Attending: Internal Medicine

## 2014-09-04 VITALS — Ht 66.0 in | Wt 275.0 lb

## 2014-09-04 DIAGNOSIS — G479 Sleep disorder, unspecified: Secondary | ICD-10-CM

## 2014-09-04 DIAGNOSIS — G4710 Hypersomnia, unspecified: Secondary | ICD-10-CM

## 2014-09-04 DIAGNOSIS — R5383 Other fatigue: Secondary | ICD-10-CM | POA: Diagnosis not present

## 2014-09-09 DIAGNOSIS — G4710 Hypersomnia, unspecified: Secondary | ICD-10-CM | POA: Diagnosis not present

## 2014-09-09 DIAGNOSIS — G479 Sleep disorder, unspecified: Secondary | ICD-10-CM | POA: Diagnosis not present

## 2014-09-09 NOTE — Sleep Study (Signed)
   NAME: Lisa [name] DATE OF BIRTH:  [birth year] MEDICAL RECORD NUMBER [id number]  LOCATION: [facility]  PHYSICIAN: Lasya Vetter D  DATE OF STUDY: 09/04/2014  SLEEP STUDY TYPE: Nocturnal Polysomnogram               REFERRING PHYSICIAN: Carlena Sax, MD  INDICATION FOR STUDY: Hypersomnia with sleep apnea  EPWORTH SLEEPINESS SCORE:   5/24 HEIGHT: 5\' 6"  (167.6 cm)  WEIGHT: 124.739 kg (275 lb)    Body mass index is 44.41 kg/(m^2).  NECK SIZE: 16 in.  MEDICATIONS: Charted for review  SLEEP ARCHITECTURE: Total sleep time 325 minutes with sleep efficiency 78.1%. Stage I was 15.1%, stage II 62.8%, stage III absent, REM 22.2% of total sleep time. Sleep latency 11.5 minutes, REM latency 173 minutes, awake after sleep onset 79.5 minutes, arousal index 19.2, bedtime medication: Ambien  RESPIRATORY DATA: Apnea hypopnea index (AHI) 19.8 per hour. 107 total events scored including 32 obstructive apneas, 1 mixed apnea, 74 hypopneas. Most events were while supine. REM AHI 14.2 per hour. This study was ordered as a diagnostic polysomnogram without CPAP.  OXYGEN DATA: Loud snoring with oxygen desaturation to a nadir of 88% and mean saturation 93.6% on room air  CARDIAC DATA: Normal sinus rhythm  MOVEMENT/PARASOMNIA: 151 total limb jerks counted of which 4 were associated with arousal or awakening for a periodic limb movement with arousal index of 0.7 per hour. Bathroom 2  IMPRESSION/ RECOMMENDATION:   1) Moderate obstructive sleep apnea/hypopnea syndrome, AHI 19.8 per hour. Events were more common while supine. The patient reported avoiding supine sleep position because of back pain, but during sleep did roll over onto her back. Snoring was loud with oxygen desaturation to a nadir of 88% and mean saturation 93.6% on room air 2) This study was ordered as a diagnostic polysomnogram without CPAP titration   Deneise Lever Diplomate, American Board of Sleep  Medicine  ELECTRONICALLY SIGNED ON:  09/09/2014, 4:26 PM [place] PH: [phone]   FX: [phone] [place]

## 2015-02-07 ENCOUNTER — Encounter: Payer: Self-pay | Admitting: Physical Therapy

## 2015-02-07 ENCOUNTER — Ambulatory Visit: Payer: 59 | Attending: Specialist | Admitting: Physical Therapy

## 2015-02-07 DIAGNOSIS — M545 Low back pain, unspecified: Secondary | ICD-10-CM

## 2015-02-07 DIAGNOSIS — R262 Difficulty in walking, not elsewhere classified: Secondary | ICD-10-CM | POA: Insufficient documentation

## 2015-02-07 NOTE — Patient Instructions (Signed)
Knee-to-Chest: with Neck Flexion Stretch (Supine)   Pull left knee to chest, tucking chin and lifting head. Hold __10__ seconds. Relax. Repeat _10___ times per set. Do _2___ sets per session. Do __2__ sessions per day.  Trunk: Knees to Chest   Lie on firm, flat surface. Keep head and shoulders flat on surface. Tuck hands behind knees and pull to chest. Hold _10___ seconds. Repeat __10__ times. Do _2___ sessions per day. CAUTION: Movement should be gentle and slow.  Caudal Rotation: Hip Roll, Neutral Lordosis - Supine   Lie with knees bent and slightly elevated, feet flat. Tighten stomach, lower knees out to right side, rotating hips and trunk. Keep stomach tight for return. Repeat _10___ times per set. Do __2__ sets per session. Do _2___ sessions per week.  Pelvic Tilt: Anterior - Legs Bent (Supine)   Rotate pelvis up and arch back. Hold ____ seconds. Relax. Repeat ____ times per set. Do ____ sets per session. Do ____ sessions per day.  Piriformis Stretch   Lying on back, pull right knee toward opposite shoulder. Hold __30__ seconds. Repeat _4___ times. Do _2___ sessions per day.  

## 2015-02-07 NOTE — Therapy (Signed)
[address] [place], [place], [address] Phone: [phone]   Fax:  [phone]  Physical Therapy Evaluation  Patient Details  Name: Lisa [name] MRN: [id number] Date of Birth: Dec 15, 1960 Referring Provider:  Susa Day, MD  Encounter Date: 02/07/2015      PT End of Session - 02/07/15 0958    Visit Number 1   Date for PT Re-Evaluation 04/09/15   PT Start Time 0926   PT Stop Time 1020   PT Time Calculation (min) 54 min   Activity Tolerance Patient tolerated treatment well   Behavior During Therapy [facility] for tasks assessed/performed      Past Medical History  Diagnosis Date  . Hypertension   . Diabetes mellitus   . Depression   . Arthritis   . Anxiety   . Chicken pox   . Hyperlipidemia   . Insomnia   . 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 Polycystic ovarian disease   . Bulging disc L-5    Past Surgical History  Procedure Laterality Date  . Dilation and evacuation  1990    miscarriage  . Tonsillectomy and adenoidectomy  1973  . 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  Visit Diagnosis:  Bilateral low back pain without sciatica - Plan: PT plan of care cert/re-cert  Difficulty walking - Plan: PT plan of care cert/re-cert      Subjective Assessment - 02/07/15 0930    Subjective Patient has had low back pain over a year, had a bulging disc at L5 and also has degenerative as well as stenosis.  Does not want surgery.     Limitations Lifting;Standing;Walking;House hold activities   How long can you sit comfortably? 1 hour   How long can you stand comfortably? 10 minutes   How long can you walk comfortably? 10 minutes   Diagnostic tests x-rays   Patient Stated Goals less pain   Currently in Pain? Yes   Pain Score 5    Pain Location Back   Pain Orientation Lower   Pain Descriptors / Indicators Stabbing   Pain Type Chronic pain   Pain Onset More than a month ago   Pain Frequency Constant   Aggravating Factors  standing, bending, lifting all increase pain to 9-10/10   Pain Relieving Factors heat, rest, prednisone at best pain a 3/10   Effect of Pain on Daily Activities limts everything            [facility] PT Assessment - 02/07/15 0001    Assessment   Medical Diagnosis low back pain with DDD and stenosis   Onset Date/Surgical Date 02/06/14   Prior Therapy yes over a year ago   Precautions   Precautions None   Precaution Comments MD has her on 10# lifting restriction for return to work   Balance Screen   Has the patient fallen in the past 6 months Yes   How many times? 1   Has the patient had a decrease in activity level because of a fear of falling?  No   Is the patient reluctant to leave their home because of a fear of falling?  No   Home Environment   Living Environment Private residence   Additional Comments has a 31 month old granddaughter, does own  housework   Prior Function   Level of Independence Independent   Vocation Full time Agricultural engineer, has to lift, push and pull   Leisure no exercise   Posture/Postural Control   Posture Comments fwd head, rounded shoulders   AROM   Overall AROM Comments Lumbar spine ROM decreased 25% with increase in pain   Strength   Overall Strength Comments LE's 4/5   Flexibility   Soft Tissue Assessment /Muscle Length --  some HS and piriformis tightness   Palpation   Palpation comment very tight with spasms in the lumbar spine and into the buttocks, mild tenderness   Special Tests    Special Tests --  manual lumbar distraction decreases some pain                   [facility] Adult PT Treatment/Exercise - 02/07/15 0001    Modalities   Modalities Moist Heat   Moist Heat Therapy   Number Minutes Moist Heat 15 Minutes   Moist Heat Location Lumbar Spine    Electrical Stimulation   Electrical Stimulation Location lumbar    Electrical Stimulation Action IFC   Electrical Stimulation Parameters in sitting   Electrical Stimulation Goals Pain                  PT Short Term Goals - 02/07/15 1003    PT SHORT TERM GOAL #1   Title independent with initial HEP   Time 2   Period Weeks   Status New           PT Long Term Goals - 02/07/15 1003    PT LONG TERM GOAL #1   Title understand proper posture and body mechanics with work and ADL's   Time 8   Period Weeks   Status New   PT LONG TERM GOAL #2   Title decrease pain 50%   Time 8   Period Weeks   Status New   PT LONG TERM GOAL #3   Title increase lumbar ROM 25%   Time 8   Period Weeks   Status New   PT LONG TERM GOAL #4   Title lift 20# from floor without increased pain   Time 8   Period Weeks   Status New               Plan - 02/07/15 1324    Clinical Impression Statement 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  She is a Press photographer.  Has difficulty standing for long periods, some tightness in the HS and piriformis    Pt will benefit from skilled therapeutic intervention in order to improve on the following deficits Decreased range of motion;Decreased strength;Increased muscle spasms;Improper body mechanics;Pain   Rehab Potential Good   PT Frequency 2x / week   PT Duration 8 weeks   PT Treatment/Interventions Moist Heat;Traction;Electrical Stimulation;Therapeutic activities;Therapeutic exercise;Manual techniques;Patient/family education   PT Next Visit Plan try traction   Consulted and Agree with Plan of Care Patient         Problem List Patient Active Problem List   Diagnosis Date Noted  . Family history of breast cancer in sister 10/21/2013  . Tick bite of back 10/21/2012  . Cellulitis 10/21/2012  . Screening for malignant neoplasm of the cervix 05/19/2012  .  Routine gynecological examination 05/19/2012  . Cough 03/05/2012  .  Tobacco use 12/30/2011  . Diabetes mellitus, type 2 12/29/2011  . HTN (hypertension) 12/29/2011  . Hyperlipidemia 12/29/2011  . CSF leak 12/29/2011  . Psoriasis 12/29/2011  . PCOS (polycystic ovarian syndrome) 12/29/2011  . Depression with anxiety 12/29/2011    Sumner Boast., PT 02/07/2015, 10:06 AM  [address] [place], [place], [address] Phone: [phone]   Fax:  [phone]

## 2015-02-15 ENCOUNTER — Encounter: Payer: Self-pay | Admitting: Physical Therapy

## 2015-02-15 ENCOUNTER — Ambulatory Visit: Payer: 59 | Attending: Specialist | Admitting: Physical Therapy

## 2015-02-15 DIAGNOSIS — R262 Difficulty in walking, not elsewhere classified: Secondary | ICD-10-CM | POA: Insufficient documentation

## 2015-02-15 DIAGNOSIS — M545 Low back pain, unspecified: Secondary | ICD-10-CM

## 2015-02-15 NOTE — Therapy (Signed)
[address] [place], [place], [address] Phone: [phone]   Fax:  [phone]  Physical Therapy Treatment  Patient Details  Name: Lisa [name] MRN: [id number] Date of Birth: 05/27/1961 Referring Provider:  Susa Day, MD  Encounter Date: 02/15/2015      PT End of Session - 02/15/15 0910    Visit Number 2   Date for PT Re-Evaluation 04/09/15   PT Start Time 0830   PT Stop Time 0930   PT Time Calculation (min) 60 min      Past Medical History  Diagnosis Date  . Hypertension   . Diabetes mellitus   . Depression   . Arthritis   . Anxiety   . Chicken pox   . Hyperlipidemia   . Insomnia   . 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 Polycystic ovarian disease   . Bulging disc L-5    Past Surgical History  Procedure Laterality Date  . Dilation and evacuation  1990    miscarriage  . Tonsillectomy and adenoidectomy  1973  . 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  Visit Diagnosis:  Bilateral low back pain without sciatica      Subjective Assessment - 02/15/15 0841    Subjective worked 4 hours yesterday and felt pain, brought back brace in to make sure using right as it does help   Currently in Pain? Yes   Pain Score 6    Pain Location Back                         [facility] Adult PT Treatment/Exercise - 02/15/15 0001    Exercises   Exercises Lumbar;Knee/Hip   Lumbar Exercises: Aerobic   Stationary Bike Nustep L 4 6 min   Lumbar Exercises: Seated   Other Seated Lumbar Exercises sit fit pelvis ROM and stab 15 times each  LAQ,marching, hip abd   Other Seated Lumbar Exercises sit to stand weight ball 10 times   Modalities   Modalities Traction;Moist Heat   Moist Heat Therapy   Number Minutes Moist Heat 15 Minutes   Moist Heat Location Lumbar Spine   Traction   Type of Traction Lumbar   Max (lbs) 60   Time 15                PT Education - 02/15/15 0846    Education provided Yes   Education Details application of back brace, basic BM for lifting   Person(s) Educated Patient   Methods Explanation;Demonstration   Comprehension Verbalized understanding;Returned demonstration          PT Short Term Goals - 02/07/15 1003    PT SHORT TERM GOAL #1   Title independent with initial HEP   Time 2   Period Weeks   Status New           PT Long Term Goals - 02/07/15 1003    PT LONG TERM GOAL #1   Title understand proper posture and body mechanics with work and ADL's   Time 8   Period Weeks   Status New   PT LONG TERM GOAL #2   Title decrease pain 50%   Time 8   Period Weeks   Status New   PT  LONG TERM GOAL #3   Title increase lumbar ROM 25%   Time 8   Period Weeks   Status New   PT LONG TERM GOAL #4   Title lift 20# from floor without increased pain   Time 8   Period Weeks   Status New               Plan - 02/15/15 0910    Clinical Impression Statement pt tolerated ther ex well, traction relieved some pain. educated pt on basic BM and use of brace at work. Pt to purchase TENS.   PT Next Visit Plan assess and progress. BM and lifting        Problem List Patient Active Problem List   Diagnosis Date Noted  . Family history of breast cancer in sister 10/21/2013  . Tick bite of back 10/21/2012  . Cellulitis 10/21/2012  . Screening for malignant neoplasm of the cervix 05/19/2012  . Routine gynecological examination 05/19/2012  . Cough 03/05/2012  . Tobacco use 12/30/2011  . Diabetes mellitus, type 2 12/29/2011  . HTN (hypertension) 12/29/2011  . Hyperlipidemia 12/29/2011  . CSF leak 12/29/2011  . Psoriasis 12/29/2011  . PCOS (polycystic ovarian syndrome) 12/29/2011  . Depression with anxiety 12/29/2011    Aiman Sonn,ANGIE PTA 02/15/2015, 9:12 AM  [address] [place], [place], [address] Phone: [phone]   Fax:  [phone]

## 2015-02-20 ENCOUNTER — Ambulatory Visit: Payer: 59 | Admitting: Physical Therapy

## 2015-02-20 ENCOUNTER — Encounter: Payer: Self-pay | Admitting: Physical Therapy

## 2015-02-20 DIAGNOSIS — M545 Low back pain, unspecified: Secondary | ICD-10-CM

## 2015-02-20 NOTE — Therapy (Signed)
[address] [place], [place], [address] Phone: [phone]   Fax:  [phone]  Physical Therapy Treatment  Patient Details  Name: Lisa [name] MRN: [id number] Date of Birth: [birth year] Referring Provider:  Susa Day, MD  Encounter Date: 02/20/2015      PT End of Session - 02/20/15 0925    Visit Number 3   Date for PT Re-Evaluation 04/09/15   PT Start Time 0845   PT Stop Time 0940   PT Time Calculation (min) 55 min      Past Medical History  Diagnosis Date  . Hypertension   . Diabetes mellitus   . Depression   . Arthritis   . Anxiety   . Chicken pox   . Hyperlipidemia   . Insomnia   . 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 Polycystic ovarian disease   . Bulging disc L-5    Past Surgical History  Procedure Laterality Date  . Dilation and evacuation  1990    miscarriage  . Tonsillectomy and adenoidectomy  1973  . 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  Visit Diagnosis:  Bilateral low back pain without sciatica      Subjective Assessment - 02/20/15 0844    Subjective worked all weekend , so increased pain, back brace helps some.TENS helps-need to order. Unsure about traction.   Currently in Pain? Yes   Pain Score 8    Pain Location Back                         [facility] Adult PT Treatment/Exercise - 02/20/15 0001    Lumbar Exercises: Aerobic   Stationary Bike Nustep L 4 6 min   Lumbar Exercises: Machines for Strengthening   Cybex Lumbar Extension blue tband 2 sets 10   Other Lumbar Machine Exercise lat pull 15# 2 sets 10   Other Lumbar Machine Exercise seated row 15# 2 sets 10   Lumbar Exercises: Standing   Row 15 reps  red tband   Shoulder Extension 15 reps  red tband   Lumbar Exercises: Seated   Other Seated Lumbar Exercises sit fit  pelvis ROM and stab 15 times each  LAQ,marching, hip abd red tband   Other Seated Lumbar Exercises weighted ball obl 15 times each side   Moist Heat Therapy   Number Minutes Moist Heat 15 Minutes   Moist Heat Location Lumbar Spine   Electrical Stimulation   Electrical Stimulation Location lumbar    Electrical Stimulation Action IFC   Electrical Stimulation Parameters seated   Electrical Stimulation Goals Pain                  PT Short Term Goals - 02/07/15 1003    PT SHORT TERM GOAL #1   Title independent with initial HEP   Time 2   Period Weeks   Status New           PT Long Term Goals - 02/07/15 1003    PT LONG TERM GOAL #1   Title understand proper posture and body mechanics with work and ADL's   Time 8   Period Weeks   Status New   PT LONG TERM GOAL #2   Title decrease pain 50%  Time 8   Period Weeks   Status New   PT LONG TERM GOAL #3   Title increase lumbar ROM 25%   Time 8   Period Weeks   Status New   PT LONG TERM GOAL #4   Title lift 20# from floor without increased pain   Time 8   Period Weeks   Status New               Plan - 02/20/15 4825    Clinical Impression Statement pt with increased tolerance to ther ex and core strength, some VCing needed. Pain decreased with ther ex some unless in standing. Pt felt taht estim helped as traction was difficult to get comfortable and relax   PT Next Visit Plan Rec pt to order TENS. BM and strength        Problem List Patient Active Problem List   Diagnosis Date Noted  . Family history of breast cancer in sister 10/21/2013  . Tick bite of back 10/21/2012  . Cellulitis 10/21/2012  . Screening for malignant neoplasm of the cervix 05/19/2012  . Routine gynecological examination 05/19/2012  . Cough 03/05/2012  . Tobacco use 12/30/2011  . Diabetes mellitus, type 2 12/29/2011  . HTN (hypertension) 12/29/2011  . Hyperlipidemia 12/29/2011  . CSF leak 12/29/2011  . Psoriasis 12/29/2011   . PCOS (polycystic ovarian syndrome) 12/29/2011  . Depression with anxiety 12/29/2011    Regenia Erck,ANGIE PTA 02/20/2015, 9:27 AM  [address] [place], [place], [address] Phone: [phone]   Fax:  [phone]

## 2015-02-23 ENCOUNTER — Encounter: Payer: Self-pay | Admitting: Physical Therapy

## 2015-02-23 ENCOUNTER — Ambulatory Visit: Payer: 59 | Admitting: Physical Therapy

## 2015-02-23 DIAGNOSIS — R262 Difficulty in walking, not elsewhere classified: Secondary | ICD-10-CM

## 2015-02-23 DIAGNOSIS — M545 Low back pain, unspecified: Secondary | ICD-10-CM

## 2015-02-23 NOTE — Therapy (Signed)
[address] [place], [place], [address] Phone: [phone]   Fax:  [phone]  Physical Therapy Treatment  Patient Details  Name: Lisa [name] MRN: [id number] Date of Birth: 02-24-1961 Referring Provider:  Susa Day, MD  Encounter Date: 02/23/2015      PT End of Session - 02/23/15 0923    Visit Number 4   Date for PT Re-Evaluation 04/09/15   PT Start Time 0850   PT Stop Time 0950   PT Time Calculation (min) 60 min      Past Medical History  Diagnosis Date  . Hypertension   . Diabetes mellitus   . Depression   . Arthritis   . Anxiety   . Chicken pox   . Hyperlipidemia   . Insomnia   . 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 Polycystic ovarian disease   . Bulging disc L-5    Past Surgical History  Procedure Laterality Date  . Dilation and evacuation  1990    miscarriage  . Tonsillectomy and adenoidectomy  1973  . 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  Visit Diagnosis:  Bilateral low back pain without sciatica  Difficulty walking      Subjective Assessment - 02/23/15 0850    Subjective feeling pretty good today, but have not worked since last weekend   Currently in Pain? Yes   Pain Score 2    Pain Location Back                         [facility] Adult PT Treatment/Exercise - 02/23/15 0001    Lumbar Exercises: Aerobic   Stationary Bike Nustep L 5 6 min   Lumbar Exercises: Standing   Other Standing Lumbar Exercises hip ext and abd 15 times each red tband   Lumbar Exercises: Seated   Other Seated Lumbar Exercises sit fit pelvis ROM and stab 15 times each  LAQ,marching, hip abd 3#   Other Seated Lumbar Exercises sit fit trunk rotation 10 times with blue tband   Lumbar Exercises: Supine   Bridge 15 reps   Large Ball Abdominal Isometric 15  reps   Large Ball Oblique Isometric 15 reps   Moist Heat Therapy   Number Minutes Moist Heat 15 Minutes   Moist Heat Location Lumbar Spine   Electrical Stimulation   Electrical Stimulation Location lumbar    Electrical Stimulation Action IFC   Electrical Stimulation Parameters seated   Electrical Stimulation Goals Pain                  PT Short Term Goals - 02/23/15 1324    PT SHORT TERM GOAL #1   Title independent with initial HEP   Status Achieved           PT Long Term Goals - 02/07/15 1003    PT LONG TERM GOAL #1   Title understand proper posture and body mechanics with work and ADL's   Time 8   Period Weeks   Status New   PT LONG TERM GOAL #2   Title decrease pain 50%   Time 8   Period Weeks   Status New   PT LONG TERM GOAL #3   Title increase lumbar ROM 25%  Time 8   Period Weeks   Status New   PT LONG TERM GOAL #4   Title lift 20# from floor without increased pain   Time 8   Period Weeks   Status New               Plan - 02/23/15 9767    Clinical Impression Statement pt with slight improvement with pain, independant with initial HEP. Pt checking into TENS for home   PT Next Visit Plan BM, core strength        Problem List Patient Active Problem List   Diagnosis Date Noted  . Family history of breast cancer in sister 10/21/2013  . Tick bite of back 10/21/2012  . Cellulitis 10/21/2012  . Screening for malignant neoplasm of the cervix 05/19/2012  . Routine gynecological examination 05/19/2012  . Cough 03/05/2012  . Tobacco use 12/30/2011  . Diabetes mellitus, type 2 12/29/2011  . HTN (hypertension) 12/29/2011  . Hyperlipidemia 12/29/2011  . CSF leak 12/29/2011  . Psoriasis 12/29/2011  . PCOS (polycystic ovarian syndrome) 12/29/2011  . Depression with anxiety 12/29/2011    PAYSEUR,ANGIE PTA 02/23/2015, 9:29 AM  [place], [place],  [address] Phone: [phone]   Fax:  [phone]

## 2015-02-28 ENCOUNTER — Ambulatory Visit: Payer: 59 | Admitting: Physical Therapy

## 2015-02-28 ENCOUNTER — Encounter: Payer: Self-pay | Admitting: Physical Therapy

## 2015-02-28 DIAGNOSIS — M545 Low back pain, unspecified: Secondary | ICD-10-CM

## 2015-02-28 NOTE — Therapy (Signed)
[place], [place], [address] Phone: [phone]   Fax:  [phone]  Physical Therapy Treatment  Patient Details  Name: Lisa [name] MRN: [id number] Date of Birth: [birth year] Referring Provider:  Susa Day, MD  Encounter Date: 02/28/2015      PT End of Session - 02/28/15 0928    Visit Number 5   PT Start Time 0843   PT Stop Time 0944   PT Time Calculation (min) 61 min   Activity Tolerance Patient tolerated treatment well   Behavior During Therapy [facility] for tasks assessed/performed      Past Medical History  Diagnosis Date  . Hypertension   . Diabetes mellitus   . Depression   . Arthritis   . Anxiety   . Chicken pox   . Hyperlipidemia   . Insomnia   . 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 Polycystic ovarian disease   . Bulging disc L-5    Past Surgical History  Procedure Laterality Date  . Dilation and evacuation  1990    miscarriage  . Tonsillectomy and adenoidectomy  1973  . 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  Visit Diagnosis:  Bilateral low back pain without sciatica      Subjective Assessment - 02/28/15 0843    Subjective Pt reports that things are going better, pain is going away.    Limitations Standing   Currently in Pain? No/denies   Pain Score 0-No pain   Pain Location Back   Pain Orientation Lower                         [facility] Adult PT Treatment/Exercise - 02/28/15 0001    Lumbar Exercises: Aerobic   Stationary Bike Nustep L 4 7 min   Lumbar Exercises: Standing   Row 15 reps  2 sets    Row Limitations X25 pulley    Other Standing Lumbar Exercises hip ext and abd 15 times each red tband   Lumbar Exercises: Seated   Other Seated Lumbar Exercises sit fit pelvis ROM and stab 15 times each  LAQ, marching #3 2X10,  Hip abd/add blue Tband 2X10   Other Seated Lumbar Exercises sit fit trunk rotation 2X10 with blue tband   Lumbar Exercises: Supine   Bridge 15 reps;1 second  2 sets    Straight Leg Raise 15 reps;1 second  2 sets    Knee/Hip Exercises: Seated   Sit to Sand 10 reps;2 sets;without UE support  #5    Moist Heat Therapy   Number Minutes Moist Heat 15 Minutes   Moist Heat Location Lumbar Spine   Electrical Stimulation   Electrical Stimulation Location lumbar    Electrical Stimulation Action IFC   Electrical Stimulation Parameters seated   Electrical Stimulation Goals Pain                  PT Short Term Goals - 02/23/15 0922    PT SHORT TERM GOAL #1   Title independent with initial HEP   Status Achieved           PT Long Term Goals - 02/28/15 0930    PT LONG TERM GOAL #2   Title decrease pain 50%   Status Achieved  Plan - 02/28/15 0929    Clinical Impression Statement Pt tolerated all interventions without subjective c/o of pain. Pt reports overall improvement. Pt does reports that she had R shoulder pain from lat pull downs from last PT session.   Pt will benefit from skilled therapeutic intervention in order to improve on the following deficits Decreased range of motion;Decreased strength;Increased muscle spasms;Improper body mechanics;Pain   Rehab Potential Good   PT Frequency 2x / week   PT Treatment/Interventions Moist Heat;Traction;Electrical Stimulation;Therapeutic activities;Therapeutic exercise;Manual techniques;Patient/family education   PT Next Visit Plan BM, core strength        Problem List Patient Active Problem List   Diagnosis Date Noted  . Family history of breast cancer in sister 10/21/2013  . Tick bite of back 10/21/2012  . Cellulitis 10/21/2012  . Screening for malignant neoplasm of the cervix 05/19/2012  . Routine gynecological examination 05/19/2012  . Cough 03/05/2012  . Tobacco use 12/30/2011  . Diabetes  mellitus, type 2 12/29/2011  . HTN (hypertension) 12/29/2011  . Hyperlipidemia 12/29/2011  . CSF leak 12/29/2011  . Psoriasis 12/29/2011  . PCOS (polycystic ovarian syndrome) 12/29/2011  . Depression with anxiety 12/29/2011    Scot Jun, PTA  02/28/2015, 9:31 AM  [place] [place] [address] [place], [place], [address] Phone: [phone]   Fax:  [phone]

## 2015-03-09 ENCOUNTER — Ambulatory Visit: Payer: 59 | Admitting: Physical Therapy

## 2015-03-09 ENCOUNTER — Encounter: Payer: Self-pay | Admitting: Physical Therapy

## 2015-03-09 DIAGNOSIS — M545 Low back pain, unspecified: Secondary | ICD-10-CM

## 2015-03-09 NOTE — Therapy (Signed)
[place] [address] [place], [place], [address] Phone: [phone]   Fax:  [phone]  Physical Therapy Treatment  Patient Details  Name: ATALIE OROS MRN: [id number] Date of Birth: [birth year] Referring Provider:  Susa Day, MD  Encounter Date: 03/09/2015      PT End of Session - 03/09/15 0836    Visit Number 6   Date for PT Re-Evaluation 04/09/15   PT Start Time 0800   PT Stop Time 0840   PT Time Calculation (min) 40 min      Past Medical History  Diagnosis Date  . Hypertension   . Diabetes mellitus   . Depression   . Arthritis   . Anxiety   . Chicken pox   . Hyperlipidemia   . Insomnia   . 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 Polycystic ovarian disease   . Bulging disc L-5    Past Surgical History  Procedure Laterality Date  . Dilation and evacuation  1990    miscarriage  . Tonsillectomy and adenoidectomy  1973  . 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  Visit Diagnosis:  Bilateral low back pain without sciatica      Subjective Assessment - 03/09/15 0801    Subjective 70 % better, still struggle after work days. Brought in TENS I purchased.   Currently in Pain? Yes   Pain Score 4    Pain Location Back                         [facility] Adult PT Treatment/Exercise - 03/09/15 0001    Self-Care   Self-Care Other Self-Care Comments  TENS educ. ,use and application   Lumbar Exercises: Aerobic   Stationary Bike Nustep L 4 7 min                PT Education - 03/09/15 0836    Education provided Yes   Education Details TENS, scap stab red tband,LE ther ex standing red tband   Person(s) Educated Patient   Methods Explanation;Demonstration;Handout   Comprehension Verbalized understanding;Returned demonstration          PT Short Term  Goals - 02/23/15 0922    PT SHORT TERM GOAL #1   Title independent with initial HEP   Status Achieved           PT Long Term Goals - 03/09/15 2355    PT LONG TERM GOAL #1   Title understand proper posture and body mechanics with work and ADL's   Status Achieved   PT LONG TERM GOAL #2   Title decrease pain 50%   Status Achieved   PT LONG TERM GOAL #3   Title increase lumbar ROM 25%   Status Achieved   PT LONG TERM GOAL #4   Title lift 20# from floor without increased pain               Plan - 03/09/15 0837    Clinical Impression Statement pt educated on HEP and TENS use. pt progressing very well and understandis importance of HEP for maintaince   PT Next Visit Plan possible D/C next visit        Problem List Patient Active Problem List   Diagnosis Date Noted  .  Family history of breast cancer in sister 10/21/2013  . Tick bite of back 10/21/2012  . Cellulitis 10/21/2012  . Screening for malignant neoplasm of the cervix 05/19/2012  . Routine gynecological examination 05/19/2012  . Cough 03/05/2012  . Tobacco use 12/30/2011  . Diabetes mellitus, type 2 12/29/2011  . HTN (hypertension) 12/29/2011  . Hyperlipidemia 12/29/2011  . CSF leak 12/29/2011  . Psoriasis 12/29/2011  . PCOS (polycystic ovarian syndrome) 12/29/2011  . Depression with anxiety 12/29/2011    PAYSEUR,ANGIE PTA 03/09/2015, 8:40 AM  [address] [place], [place], [address] Phone: [phone]   Fax:  [phone]

## 2015-03-16 ENCOUNTER — Ambulatory Visit: Payer: 59 | Attending: Specialist | Admitting: Physical Therapy

## 2015-03-16 ENCOUNTER — Encounter: Payer: Self-pay | Admitting: Physical Therapy

## 2015-03-16 DIAGNOSIS — M545 Low back pain, unspecified: Secondary | ICD-10-CM

## 2015-03-16 NOTE — Therapy (Signed)
[address] [place], [place], [address] Phone: [phone]   Fax:  [phone]  Physical Therapy Treatment  Patient Details  Name: Lisa [name] MRN: [id number] Date of Birth: 1961-05-16 Referring Provider:  Susa Day, MD  Encounter Date: 03/16/2015      PT End of Session - 03/16/15 0902    Visit Number 7   Date for PT Re-Evaluation 04/09/15   PT Start Time 0845   PT Stop Time 0930   PT Time Calculation (min) 45 min      Past Medical History  Diagnosis Date  . Hypertension   . Diabetes mellitus   . Depression   . Arthritis   . Anxiety   . Chicken pox   . Hyperlipidemia   . Insomnia   . 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 Polycystic ovarian disease   . Bulging disc L-5    Past Surgical History  Procedure Laterality Date  . Dilation and evacuation  1990    miscarriage  . Tonsillectomy and adenoidectomy  1973  . 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  Visit Diagnosis:  Bilateral low back pain without sciatica      Subjective Assessment - 03/16/15 0848    Subjective feeling 75% better, good with HEP and TENS   Currently in Pain? No/denies                         [facility] Adult PT Treatment/Exercise - 03/16/15 0001    Therapeutic Activites    Therapeutic Activities Lifting;Work Environmental health practitioner floor to [employer]   Lumbar Exercises: Aerobic   Stationary Bike Nustep L 5 8 min   Lumbar Exercises: Machines for Strengthening   Cybex Lumbar Extension 15# pulleys 2 sets 10   Other Lumbar Machine Exercise lat pull 15# 2 sets 10   Other Lumbar Machine Exercise seated row 15# 2 sets 15   Knee/Hip Exercises: Machines for Strengthening   Cybex Knee Extension 10# 3 sets10   Cybex Knee Flexion 25# 2 sets15   Cybex Leg Press 40# 3 sets 10                 PT Education - 03/16/15 0859    Education Details BM ,lifting and ADLs   Person(s) Educated Patient   Methods Demonstration;Explanation   Comprehension Returned demonstration;Verbalized understanding          PT Short Term Goals - 02/23/15 0922    PT SHORT TERM GOAL #1   Title independent with initial HEP   Status Achieved           PT Long Term Goals - 03/16/15 0848    PT LONG TERM GOAL #1   Title understand proper posture and body mechanics with work and ADL's   Status Achieved   PT LONG TERM GOAL #2   Title decrease pain 50%   Status Achieved   PT LONG TERM GOAL #3   Title increase lumbar ROM 25%   Status Achieved   PT LONG TERM GOAL #4   Title lift 20# from floor without increased pain   Baseline can demo with correct BM but not advised to lift that much regularly   Status Achieved  Plan - 03/16/15 0903    Clinical Impression Statement all goals met   PT Next Visit Plan D/C        Problem List Patient Active Problem List   Diagnosis Date Noted  . Family history of breast cancer in sister 10/21/2013  . Tick bite of back 10/21/2012  . Cellulitis 10/21/2012  . Screening for malignant neoplasm of the cervix 05/19/2012  . Routine gynecological examination 05/19/2012  . Cough 03/05/2012  . Tobacco use 12/30/2011  . Diabetes mellitus, type 2 ([place]) 12/29/2011  . HTN (hypertension) 12/29/2011  . Hyperlipidemia 12/29/2011  . CSF leak 12/29/2011  . Psoriasis 12/29/2011  . PCOS (polycystic ovarian syndrome) 12/29/2011  . Depression with anxiety 12/29/2011  PHYSICAL THERAPY DISCHARGE SUMMARY   Plan: Patient agrees to discharge.  Patient goals were met. Patient is being discharged due to meeting the stated rehab goals.  ?????       Tayvia Faughnan,ANGIE PTA 03/16/2015, 9:15 AM  [place], [place], [address] Phone: [phone]   Fax:   [phone]

## 2015-03-20 ENCOUNTER — Other Ambulatory Visit: Payer: Self-pay

## 2015-03-20 DIAGNOSIS — Z1231 Encounter for screening mammogram for malignant neoplasm of breast: Secondary | ICD-10-CM

## 2015-03-30 ENCOUNTER — Ambulatory Visit
Admission: RE | Admit: 2015-03-30 | Discharge: 2015-03-30 | Disposition: A | Discharge status: Home / Self Care | Payer: 59 | Source: Ambulatory Visit

## 2015-03-30 DIAGNOSIS — Z1231 Encounter for screening mammogram for malignant neoplasm of breast: Secondary | ICD-10-CM

## 2015-06-18 MED FILL — METFORMIN HCL ER 500 MG TAB: 500 | 90 days supply | Qty: 360 | Fill #0

## 2015-06-18 MED FILL — AMOX-CLAV 875-125 MG TABLET: 875-125 | 10 days supply | Qty: 20 | Fill #0

## 2015-08-14 MED FILL — ZOLPIDEM TARTRATE 5 MG TAB: 5 | 30 days supply | Qty: 30 | Fill #0

## 2015-08-14 MED FILL — ULTICARE PEN NDL 8MM 31G: 31G X 8 MM | 90 days supply | Qty: 100 | Fill #0

## 2015-08-16 MED FILL — LISINOPRIL 20 MG TABLET: 20 | 90 days supply | Qty: 90 | Fill #1

## 2015-08-16 MED FILL — VICTOZA 2-PAK 18 MG/3 ML PE: 18 | 30 days supply | Qty: 6 | Fill #0

## 2015-08-16 MED FILL — DULoxetine HCL 30 MG CPEP: 30 | 90 days supply | Qty: 270 | Fill #0

## 2015-08-16 MED FILL — SPIRONOLACTONE 25 MG TABLET: 25 | 90 days supply | Qty: 90 | Fill #1

## 2015-09-20 MED FILL — GLIMEPIRIDE 4 MG TABLET: 4 | 90 days supply | Qty: 90 | Fill #1

## 2015-09-20 MED FILL — METFORMIN HCL ER 500 MG TAB: 500 | 90 days supply | Qty: 360 | Fill #1

## 2015-09-24 MED FILL — ONDANSETRON ODT 8 MG TABLET: 8 | 5 days supply | Qty: 15 | Fill #0

## 2015-11-02 MED FILL — ZOLPIDEM TARTRATE 5 MG TAB: 5 | 30 days supply | Qty: 30 | Fill #0

## 2015-11-02 MED FILL — ELFOLATE 15 MG TAB: 15 | 30 days supply | Qty: 30 | Fill #0

## 2015-11-14 DIAGNOSIS — E785 Hyperlipidemia, unspecified: Secondary | ICD-10-CM | POA: Diagnosis not present

## 2015-11-15 DIAGNOSIS — E782 Mixed hyperlipidemia: Secondary | ICD-10-CM | POA: Diagnosis not present

## 2015-11-15 DIAGNOSIS — E1169 Type 2 diabetes mellitus with other specified complication: Secondary | ICD-10-CM | POA: Diagnosis not present

## 2015-11-15 MED FILL — DULoxetine HCL 30 MG CPEP: 30 | 90 days supply | Qty: 270 | Fill #1

## 2015-11-19 DIAGNOSIS — E782 Mixed hyperlipidemia: Secondary | ICD-10-CM | POA: Diagnosis not present

## 2015-11-19 DIAGNOSIS — F330 Major depressive disorder, recurrent, mild: Secondary | ICD-10-CM | POA: Diagnosis not present

## 2015-11-19 DIAGNOSIS — G8929 Other chronic pain: Secondary | ICD-10-CM | POA: Diagnosis not present

## 2015-11-19 DIAGNOSIS — E282 Polycystic ovarian syndrome: Secondary | ICD-10-CM | POA: Diagnosis not present

## 2015-11-19 DIAGNOSIS — M545 Low back pain: Secondary | ICD-10-CM | POA: Diagnosis not present

## 2015-11-19 DIAGNOSIS — E785 Hyperlipidemia, unspecified: Secondary | ICD-10-CM | POA: Diagnosis not present

## 2015-11-19 DIAGNOSIS — E1169 Type 2 diabetes mellitus with other specified complication: Secondary | ICD-10-CM | POA: Diagnosis not present

## 2015-11-19 MED FILL — LISINOPRIL 20 MG TABLET: 20 | 90 days supply | Qty: 90 | Fill #0

## 2015-11-19 MED FILL — ATORVASTATIN 10 MG TABLET: 10 | 90 days supply | Qty: 90 | Fill #0

## 2015-11-19 MED FILL — VICTOZA 18 MG/3 ML INJECT P: 18 | 90 days supply | Qty: 18 | Fill #0

## 2015-11-19 MED FILL — SPIRONOLACTONE 25 MG TABLET: 25 | 90 days supply | Qty: 90 | Fill #0

## 2015-11-29 MED FILL — ELFOLATE 15 MG TAB: 15 | 30 days supply | Qty: 30 | Fill #1

## 2015-12-05 MED FILL — ZOLPIDEM TARTRATE 5 MG TAB: 5 | 30 days supply | Qty: 30 | Fill #0

## 2015-12-20 DIAGNOSIS — E782 Mixed hyperlipidemia: Secondary | ICD-10-CM | POA: Diagnosis not present

## 2015-12-20 DIAGNOSIS — E785 Hyperlipidemia, unspecified: Secondary | ICD-10-CM | POA: Diagnosis not present

## 2015-12-20 DIAGNOSIS — Z23 Encounter for immunization: Secondary | ICD-10-CM | POA: Diagnosis not present

## 2015-12-20 DIAGNOSIS — F330 Major depressive disorder, recurrent, mild: Secondary | ICD-10-CM | POA: Diagnosis not present

## 2015-12-20 DIAGNOSIS — M545 Low back pain: Secondary | ICD-10-CM | POA: Diagnosis not present

## 2015-12-20 DIAGNOSIS — E1169 Type 2 diabetes mellitus with other specified complication: Secondary | ICD-10-CM | POA: Diagnosis not present

## 2015-12-20 DIAGNOSIS — G8929 Other chronic pain: Secondary | ICD-10-CM | POA: Diagnosis not present

## 2015-12-20 MED FILL — METFORMIN HCL ER 500 MG TAB: 500 | 90 days supply | Qty: 360 | Fill #0

## 2015-12-20 MED FILL — BUPROPION HCL XL 300 MG TAB: 300 | 90 days supply | Qty: 90 | Fill #0

## 2016-01-03 MED FILL — ZOLPIDEM TARTRATE 5 MG TAB: 5 | 30 days supply | Qty: 30 | Fill #0

## 2016-01-04 MED FILL — ELFOLATE 15 MG TAB: 15 | 30 days supply | Qty: 30 | Fill #2

## 2016-01-04 MED FILL — GLIMEPIRIDE 4 MG TABLET: 4 | 90 days supply | Qty: 90 | Fill #0

## 2016-02-01 MED FILL — ZOLPIDEM TARTRATE 5 MG TAB: 5 | 30 days supply | Qty: 30 | Fill #0

## 2016-02-01 MED FILL — DULoxetine HCL 30 MG CPEP: 30 | 90 days supply | Qty: 270 | Fill #2

## 2016-02-01 MED FILL — ELFOLATE 15 MG TAB: 15 | 30 days supply | Qty: 30 | Fill #3

## 2016-02-14 MED FILL — SPIRONOLACTONE 25 MG TABLET: 25 | 90 days supply | Qty: 90 | Fill #1

## 2016-02-14 MED FILL — LISINOPRIL 20 MG TABLET: 20 | 90 days supply | Qty: 90 | Fill #1

## 2016-02-14 MED FILL — ULTICARE PEN NDL 8MM 31G: 31G X 8 MM | 90 days supply | Qty: 100 | Fill #1

## 2016-02-14 MED FILL — ATORVASTATIN 10 MG TABLET: 10 | 90 days supply | Qty: 90 | Fill #0

## 2016-02-29 MED FILL — ELFOLATE 15 MG TAB: 15 | 30 days supply | Qty: 30 | Fill #4

## 2016-03-03 MED FILL — ZOLPIDEM TARTRATE 5 MG TAB: 5 | 30 days supply | Qty: 30 | Fill #0

## 2016-03-07 MED FILL — VICTOZA 18 MG/3 ML INJECT P: 18 | 90 days supply | Qty: 18 | Fill #1

## 2016-03-24 DIAGNOSIS — E782 Mixed hyperlipidemia: Secondary | ICD-10-CM | POA: Diagnosis not present

## 2016-03-24 DIAGNOSIS — K629 Disease of anus and rectum, unspecified: Secondary | ICD-10-CM | POA: Diagnosis not present

## 2016-03-24 DIAGNOSIS — Z6841 Body Mass Index (BMI) 40.0 and over, adult: Secondary | ICD-10-CM | POA: Diagnosis not present

## 2016-03-24 DIAGNOSIS — B078 Other viral warts: Secondary | ICD-10-CM | POA: Diagnosis not present

## 2016-03-24 DIAGNOSIS — Z1159 Encounter for screening for other viral diseases: Secondary | ICD-10-CM | POA: Diagnosis not present

## 2016-03-24 DIAGNOSIS — E1169 Type 2 diabetes mellitus with other specified complication: Secondary | ICD-10-CM | POA: Diagnosis not present

## 2016-03-24 DIAGNOSIS — K623 Rectal prolapse: Secondary | ICD-10-CM | POA: Diagnosis not present

## 2016-03-24 MED FILL — GLIMEPIRIDE 4 MG TABLET: 4 | 90 days supply | Qty: 90 | Fill #0

## 2016-03-24 MED FILL — ELFOLATE 15 MG TAB: 15 | 30 days supply | Qty: 30 | Fill #0

## 2016-03-24 MED FILL — BUPROPION HCL XL 300 MG TAB: 300 | 90 days supply | Qty: 90 | Fill #0

## 2016-03-28 MED FILL — METFORMIN HCL ER 500 MG TAB: 500 | 90 days supply | Qty: 360 | Fill #1

## 2016-04-10 MED FILL — ZOLPIDEM TARTRATE 5 MG TAB: 5 | 30 days supply | Qty: 30 | Fill #0

## 2016-05-05 MED FILL — DULoxetine HCL 30 MG CPEP: 30 | 90 days supply | Qty: 270 | Fill #0

## 2016-05-08 MED FILL — ZOLPIDEM TARTRATE 5 MG TAB: 5 | 30 days supply | Qty: 30 | Fill #0

## 2016-05-09 MED FILL — VICTOZA 18 MG/3 ML INJECT P: 18 | 90 days supply | Qty: 27 | Fill #0

## 2016-05-14 ENCOUNTER — Other Ambulatory Visit (HOSPITAL_COMMUNITY): Payer: Self-pay | Admitting: Family Medicine

## 2016-05-14 DIAGNOSIS — K623 Rectal prolapse: Secondary | ICD-10-CM

## 2016-05-14 DIAGNOSIS — K6289 Other specified diseases of anus and rectum: Secondary | ICD-10-CM

## 2016-05-16 ENCOUNTER — Ambulatory Visit (HOSPITAL_COMMUNITY)
Admission: RE | Admit: 2016-05-16 | Discharge: 2016-05-16 | Disposition: A | Discharge status: Home / Self Care | Payer: 59 | Source: Ambulatory Visit | Attending: Family Medicine | Admitting: Family Medicine

## 2016-05-23 ENCOUNTER — Ambulatory Visit (HOSPITAL_COMMUNITY)
Admission: RE | Admit: 2016-05-23 | Discharge: 2016-05-23 | Disposition: A | Discharge status: Home / Self Care | Payer: 59 | Source: Ambulatory Visit | Attending: Family Medicine | Admitting: Family Medicine

## 2016-05-23 DIAGNOSIS — E119 Type 2 diabetes mellitus without complications: Secondary | ICD-10-CM | POA: Insufficient documentation

## 2016-05-23 DIAGNOSIS — K623 Rectal prolapse: Secondary | ICD-10-CM | POA: Insufficient documentation

## 2016-05-23 DIAGNOSIS — K6289 Other specified diseases of anus and rectum: Secondary | ICD-10-CM

## 2016-05-23 LAB — POCT I-STAT CREATININE: CREATININE: 0.7 mg/dL (ref 0.44–1.00)

## 2016-05-23 MED ORDER — IOPAMIDOL (ISOVUE-300) INJECTION 61%
INTRAVENOUS | Status: AC
  Administered 2016-05-23: 100 mL via INTRAVENOUS
  Filled 2016-05-23: qty 100

## 2016-05-29 MED FILL — LISINOPRIL 20 MG TABLET: 20 | 90 days supply | Qty: 90 | Fill #0

## 2016-05-29 MED FILL — SPIRONOLACTONE 25 MG TABLET: 25 | 90 days supply | Qty: 90 | Fill #0

## 2016-06-12 DIAGNOSIS — K760 Fatty (change of) liver, not elsewhere classified: Secondary | ICD-10-CM | POA: Diagnosis not present

## 2016-06-12 DIAGNOSIS — R160 Hepatomegaly, not elsewhere classified: Secondary | ICD-10-CM | POA: Diagnosis not present

## 2016-06-12 MED FILL — ZOLPIDEM TARTRATE 5 MG TAB: 5 | 30 days supply | Qty: 30 | Fill #0

## 2016-06-23 DIAGNOSIS — F331 Major depressive disorder, recurrent, moderate: Secondary | ICD-10-CM | POA: Diagnosis not present

## 2016-06-23 DIAGNOSIS — K529 Noninfective gastroenteritis and colitis, unspecified: Secondary | ICD-10-CM | POA: Diagnosis not present

## 2016-06-23 DIAGNOSIS — R6889 Other general symptoms and signs: Secondary | ICD-10-CM | POA: Diagnosis not present

## 2016-06-23 MED FILL — ALPRAZolam 0.5 MG TABS: 0.5 | 15 days supply | Qty: 15 | Fill #0

## 2016-06-23 MED FILL — ULTICARE PEN NDL 8MM 31G: 31G X 8 MM | 90 days supply | Qty: 100 | Fill #0

## 2016-07-10 DIAGNOSIS — I959 Hypotension, unspecified: Secondary | ICD-10-CM | POA: Diagnosis not present

## 2016-07-10 DIAGNOSIS — F331 Major depressive disorder, recurrent, moderate: Secondary | ICD-10-CM | POA: Diagnosis not present

## 2016-07-10 MED FILL — SERTRALINE HCL 25 MG TABLET: 25 | 60 days supply | Qty: 60 | Fill #0

## 2016-07-10 MED FILL — ZOLPIDEM TARTRATE 5 MG TAB: 5 | 30 days supply | Qty: 30 | Fill #0

## 2016-07-10 MED FILL — metFORMIN HCL 1000 MG TABS: 1000 | 90 days supply | Qty: 180 | Fill #0

## 2016-07-10 MED FILL — buPROPion HCL ER (XL) 150 M: 150 | 30 days supply | Qty: 30 | Fill #0

## 2016-07-17 MED FILL — GLIMEPIRIDE 4 MG TABLET: 4 | 90 days supply | Qty: 90 | Fill #0

## 2016-08-11 MED FILL — ZOLPIDEM TARTRATE 5 MG TAB: 5 | 30 days supply | Qty: 30 | Fill #0

## 2016-08-11 MED FILL — VICTOZA 18 MG/3 ML INJECT P: 18 | 90 days supply | Qty: 27 | Fill #0

## 2016-08-11 MED FILL — DULoxetine HCL 30 MG CPEP: 30 | 90 days supply | Qty: 270 | Fill #1

## 2016-09-04 MED FILL — LISINOPRIL 20 MG TABLET: 20 | 90 days supply | Qty: 90 | Fill #0

## 2016-09-04 MED FILL — SPIRONOLACTONE 25 MG TABLET: 25 | 90 days supply | Qty: 90 | Fill #0

## 2016-09-04 MED FILL — VENTOLIN HFA 90 MCG INHALER: 108 (90 BAS | 25 days supply | Qty: 18 | Fill #0

## 2016-09-11 MED FILL — SERTRALINE HCL 25 MG TABLET: 25 | 60 days supply | Qty: 60 | Fill #0

## 2016-09-11 MED FILL — ZOLPIDEM TARTRATE 5 MG TAB: 5 | 30 days supply | Qty: 30 | Fill #0

## 2016-10-07 MED FILL — ZOLPIDEM TARTRATE 5 MG TAB: 5 | 30 days supply | Qty: 30 | Fill #0

## 2016-10-07 MED FILL — metFORMIN HCL 1000 MG TABS: 1000 | 90 days supply | Qty: 180 | Fill #0

## 2016-10-10 MED FILL — ULTICARE PEN NDL 8MM 31G: 31G X 8 MM | 90 days supply | Qty: 100 | Fill #1

## 2016-10-27 DIAGNOSIS — E785 Hyperlipidemia, unspecified: Secondary | ICD-10-CM | POA: Diagnosis not present

## 2016-10-27 DIAGNOSIS — E1169 Type 2 diabetes mellitus with other specified complication: Secondary | ICD-10-CM | POA: Diagnosis not present

## 2016-10-27 DIAGNOSIS — E282 Polycystic ovarian syndrome: Secondary | ICD-10-CM | POA: Diagnosis not present

## 2016-10-27 DIAGNOSIS — M25561 Pain in right knee: Secondary | ICD-10-CM | POA: Diagnosis not present

## 2016-10-27 DIAGNOSIS — I959 Hypotension, unspecified: Secondary | ICD-10-CM | POA: Diagnosis not present

## 2016-10-27 DIAGNOSIS — F331 Major depressive disorder, recurrent, moderate: Secondary | ICD-10-CM | POA: Diagnosis not present

## 2016-10-27 DIAGNOSIS — E782 Mixed hyperlipidemia: Secondary | ICD-10-CM | POA: Diagnosis not present

## 2016-10-27 MED FILL — ALPRAZolam 0.5 MG TABS: 0.5 | 8 days supply | Qty: 15 | Fill #0

## 2016-10-28 MED FILL — GLIMEPIRIDE 2 MG TABLET: 2 | 90 days supply | Qty: 90 | Fill #0

## 2016-10-29 MED FILL — SERTRALINE HCL 50 MG TABLET: 50 | 90 days supply | Qty: 90 | Fill #0

## 2016-12-03 MED FILL — SPIRONOLACTONE 25 MG TABLET: 25 | 90 days supply | Qty: 90 | Fill #0

## 2016-12-03 MED FILL — VICTOZA 18 MG/3 ML INJECT P: 18 | 90 days supply | Qty: 27 | Fill #0

## 2017-01-07 MED FILL — LISINOPRIL 10 MG TABLET: 10 | 90 days supply | Qty: 90 | Fill #0

## 2017-01-07 MED FILL — metFORMIN HCL 1000 MG TABS: 1000 | 90 days supply | Qty: 180 | Fill #0

## 2017-01-15 MED FILL — SERTRALINE HCL 50 MG TABLET: 50 | 90 days supply | Qty: 90 | Fill #1

## 2017-02-06 MED FILL — GLIMEPIRIDE 2 MG TABLET: 2 | 90 days supply | Qty: 90 | Fill #1

## 2017-02-06 MED FILL — ULTICARE PEN NDL 8MM 31G: 31G X 8 MM | 90 days supply | Qty: 100 | Fill #2

## 2017-03-06 MED FILL — SPIRONOLACTONE 25 MG TABLET: 25 | 90 days supply | Qty: 90 | Fill #1

## 2017-03-06 MED FILL — VICTOZA 18 MG/3 ML INJECT P: 18 | 90 days supply | Qty: 27 | Fill #0

## 2017-03-17 MED FILL — SERTRALINE HCL 50 MG TABLET: 50 | 30 days supply | Qty: 45 | Fill #0

## 2017-04-03 MED FILL — LISINOPRIL 10 MG TABLET: 10 | 90 days supply | Qty: 90 | Fill #0

## 2017-04-10 MED FILL — SERTRALINE HCL 50 MG TABLET: 50 | 30 days supply | Qty: 45 | Fill #1

## 2017-04-10 MED FILL — metFORMIN HCL 1000 MG TABS: 1000 | 90 days supply | Qty: 180 | Fill #1

## 2017-05-11 DIAGNOSIS — L259 Unspecified contact dermatitis, unspecified cause: Secondary | ICD-10-CM | POA: Diagnosis not present

## 2017-05-11 DIAGNOSIS — E1169 Type 2 diabetes mellitus with other specified complication: Secondary | ICD-10-CM | POA: Diagnosis not present

## 2017-05-11 DIAGNOSIS — Z0000 Encounter for general adult medical examination without abnormal findings: Secondary | ICD-10-CM | POA: Diagnosis not present

## 2017-05-11 DIAGNOSIS — E782 Mixed hyperlipidemia: Secondary | ICD-10-CM | POA: Diagnosis not present

## 2017-05-11 DIAGNOSIS — E785 Hyperlipidemia, unspecified: Secondary | ICD-10-CM | POA: Diagnosis not present

## 2017-05-11 DIAGNOSIS — F331 Major depressive disorder, recurrent, moderate: Secondary | ICD-10-CM | POA: Diagnosis not present

## 2017-05-11 DIAGNOSIS — Z23 Encounter for immunization: Secondary | ICD-10-CM | POA: Diagnosis not present

## 2017-05-11 DIAGNOSIS — E282 Polycystic ovarian syndrome: Secondary | ICD-10-CM | POA: Diagnosis not present

## 2017-05-11 MED FILL — SERTRALINE HCL 25 MG TABLET: 25 | 90 days supply | Qty: 90 | Fill #0

## 2017-05-11 MED FILL — LISINOPRIL 5 MG TAB: 5 | 90 days supply | Qty: 90 | Fill #0

## 2017-05-11 MED FILL — SERTRALINE HCL 50 MG TABLET: 50 | 90 days supply | Qty: 90 | Fill #0

## 2017-05-11 MED FILL — TRIAMCINOLONE 0.5% OINTMENT: 0.5 | 15 days supply | Qty: 30 | Fill #0

## 2017-05-14 ENCOUNTER — Other Ambulatory Visit: Payer: Self-pay | Admitting: Family Medicine

## 2017-05-14 DIAGNOSIS — Z1231 Encounter for screening mammogram for malignant neoplasm of breast: Secondary | ICD-10-CM

## 2017-05-25 MED FILL — SPIRONOLACTONE 25 MG TABLET: 25 | 90 days supply | Qty: 90 | Fill #0

## 2017-05-25 MED FILL — ULTICARE PEN NDL 8MM 31G: 31G X 8 MM | 100 days supply | Qty: 100 | Fill #0

## 2017-05-27 MED FILL — VICTOZA 18 MG/3 ML INJECT P: 18 | 90 days supply | Qty: 27 | Fill #1

## 2017-06-05 MED FILL — AMOX-CLAV 875-125 MG TABLET: 875-125 | 10 days supply | Qty: 20 | Fill #0

## 2017-06-16 ENCOUNTER — Ambulatory Visit
Admission: RE | Admit: 2017-06-16 | Discharge: 2017-06-16 | Disposition: A | Discharge status: Home / Self Care | Payer: 59 | Source: Ambulatory Visit | Attending: Family Medicine | Admitting: Family Medicine

## 2017-06-16 DIAGNOSIS — Z1231 Encounter for screening mammogram for malignant neoplasm of breast: Secondary | ICD-10-CM

## 2017-07-09 MED FILL — metFORMIN HCL 1000 MG TABS: 1000 | 90 days supply | Qty: 180 | Fill #0

## 2017-07-14 ENCOUNTER — Encounter: Payer: Self-pay | Admitting: Internal Medicine

## 2017-08-04 DIAGNOSIS — E1169 Type 2 diabetes mellitus with other specified complication: Secondary | ICD-10-CM | POA: Diagnosis not present

## 2017-08-04 DIAGNOSIS — R1012 Left upper quadrant pain: Secondary | ICD-10-CM | POA: Diagnosis not present

## 2017-08-04 DIAGNOSIS — E782 Mixed hyperlipidemia: Secondary | ICD-10-CM | POA: Diagnosis not present

## 2017-08-04 DIAGNOSIS — B079 Viral wart, unspecified: Secondary | ICD-10-CM | POA: Diagnosis not present

## 2017-08-04 DIAGNOSIS — K5730 Diverticulosis of large intestine without perforation or abscess without bleeding: Secondary | ICD-10-CM | POA: Diagnosis not present

## 2017-08-04 DIAGNOSIS — Z716 Tobacco abuse counseling: Secondary | ICD-10-CM | POA: Diagnosis not present

## 2017-08-04 DIAGNOSIS — F331 Major depressive disorder, recurrent, moderate: Secondary | ICD-10-CM | POA: Diagnosis not present

## 2017-08-04 DIAGNOSIS — M545 Low back pain: Secondary | ICD-10-CM | POA: Diagnosis not present

## 2017-08-04 DIAGNOSIS — R112 Nausea with vomiting, unspecified: Secondary | ICD-10-CM | POA: Diagnosis not present

## 2017-08-04 MED FILL — raNITIdine HCL 150 MG TABS: 150 | 30 days supply | Qty: 60 | Fill #0

## 2017-08-04 MED FILL — SERTRALINE HCL 100 MG TAB: 100 | 90 days supply | Qty: 90 | Fill #0

## 2017-08-04 MED FILL — MELOXICAM 7.5 MG TABLET: 7.5 | 90 days supply | Qty: 90 | Fill #0

## 2017-09-01 MED FILL — SPIRONOLACTONE 25 MG TABS: 25 | 90 days supply | Qty: 90 | Fill #1

## 2017-09-21 MED FILL — raNITIdine HCL 150 MG TABS: 150 | 30 days supply | Qty: 60 | Fill #1

## 2017-09-29 MED FILL — ULTICARE PEN NDL 8MM 31G: 31G X 8 MM | 50 days supply | Qty: 100 | Fill #0

## 2017-09-29 MED FILL — BYETTA 5 MCG DOSE PEN INJ: 5 | 90 days supply | Qty: 4 | Fill #0

## 2017-10-12 MED FILL — metFORMIN HCL 1000 MG TABS: 1000 | 90 days supply | Qty: 180 | Fill #0

## 2017-11-13 MED FILL — SERTRALINE HCL 100 MG TAB: 100 | 30 days supply | Qty: 30 | Fill #0

## 2017-11-13 MED FILL — raNITIdine HCL 150 MG TABS: 150 | 30 days supply | Qty: 60 | Fill #0

## 2017-12-08 MED FILL — SERTRALINE HCL 100 MG TAB: 100 | 90 days supply | Qty: 90 | Fill #0

## 2017-12-08 MED FILL — SPIRONOLACTONE 25 MG TABLET: 25 | 90 days supply | Qty: 90 | Fill #0

## 2017-12-08 MED FILL — LISINOPRIL 5 MG TABLET: 5 | 90 days supply | Qty: 90 | Fill #1

## 2018-01-11 DIAGNOSIS — E785 Hyperlipidemia, unspecified: Secondary | ICD-10-CM | POA: Diagnosis not present

## 2018-01-11 DIAGNOSIS — E782 Mixed hyperlipidemia: Secondary | ICD-10-CM | POA: Diagnosis not present

## 2018-01-11 DIAGNOSIS — E1169 Type 2 diabetes mellitus with other specified complication: Secondary | ICD-10-CM | POA: Diagnosis not present

## 2018-01-11 DIAGNOSIS — F331 Major depressive disorder, recurrent, moderate: Secondary | ICD-10-CM | POA: Diagnosis not present

## 2018-01-11 DIAGNOSIS — Z23 Encounter for immunization: Secondary | ICD-10-CM | POA: Diagnosis not present

## 2018-01-11 MED FILL — metFORMIN HCL 1000 MG TABS: 1000 | 90 days supply | Qty: 180 | Fill #0

## 2018-01-11 MED FILL — ALPRAZolam 0.5 MG TABS: 0.5 | 8 days supply | Qty: 15 | Fill #0

## 2018-01-11 MED FILL — VICTOZA 2-PAK 18 MG/3 ML PE: 18 | 30 days supply | Qty: 3 | Fill #0

## 2018-01-14 MED FILL — EZETIMIBE 10 MG TABLET: 10 | 30 days supply | Qty: 30 | Fill #0

## 2018-02-25 MED FILL — SERTRALINE HCL 100 MG TAB: 100 | 90 days supply | Qty: 135 | Fill #0

## 2018-03-16 MED FILL — SPIRONOLACTONE 25 MG TABLET: 25 | 90 days supply | Qty: 90 | Fill #0

## 2018-03-16 MED FILL — LISINOPRIL 5 MG TABLET: 5 | 90 days supply | Qty: 90 | Fill #0

## 2018-04-16 MED FILL — metFORMIN HCL 1000 MG TABS: 1000 | 90 days supply | Qty: 180 | Fill #0

## 2018-04-16 MED FILL — ALPRAZolam 0.5 MG TABS: 0.5 | 8 days supply | Qty: 15 | Fill #0

## 2018-06-16 MED FILL — SPIRONOLACTONE 25 MG TABLET: 25 | 30 days supply | Qty: 30 | Fill #0

## 2018-06-16 MED FILL — SERTRALINE HCL 100 MG TAB: 100 | 30 days supply | Qty: 45 | Fill #0

## 2018-06-16 MED FILL — LISINOPRIL 5 MG TABLET: 5 | 30 days supply | Qty: 30 | Fill #0

## 2018-06-30 MED FILL — metFORMIN HCL 1000 MG TABS: 1000 | 90 days supply | Qty: 180 | Fill #0

## 2018-07-16 MED FILL — SERTRALINE HCL 100 MG TAB: 100 | 30 days supply | Qty: 45 | Fill #0

## 2018-07-16 MED FILL — SPIRONOLACTONE 25 MG TABLET: 25 | 30 days supply | Qty: 30 | Fill #0

## 2018-07-16 MED FILL — LISINOPRIL 5 MG TABLET: 5 | 30 days supply | Qty: 30 | Fill #0

## 2018-08-09 MED FILL — JANUMET 50-1,000 MG TABLET: 50-1000 | 90 days supply | Qty: 180 | Fill #0

## 2018-08-09 MED FILL — LISINOPRIL 5 MG TABLET: 5 | 90 days supply | Qty: 90 | Fill #0

## 2018-08-09 MED FILL — SERTRALINE HCL 100 MG TAB: 100 | 90 days supply | Qty: 135 | Fill #0

## 2018-08-09 MED FILL — SPIRONOLACTONE 25 MG TABLET: 25 | 90 days supply | Qty: 90 | Fill #0

## 2018-08-09 MED FILL — MELOXICAM 15 MG TABLET: 15 | 90 days supply | Qty: 90 | Fill #0

## 2018-11-09 MED FILL — SERTRALINE HCL 100 MG TAB: 100 | 90 days supply | Qty: 135 | Fill #1

## 2018-11-09 MED FILL — JANUMET 50-1,000 MG TABLET: 50-1000 | 90 days supply | Qty: 180 | Fill #1

## 2018-11-09 MED FILL — SPIRONOLACTONE 25 MG TABLET: 25 | 90 days supply | Qty: 90 | Fill #1

## 2018-11-09 MED FILL — MELOXICAM 15 MG TABLET: 15 | 90 days supply | Qty: 90 | Fill #1

## 2018-11-09 MED FILL — LISINOPRIL 5 MG TABLET: 5 | 90 days supply | Qty: 90 | Fill #1

## 2019-02-11 MED FILL — SERTRALINE HCL 100 MG TABS: 100 | 90 days supply | Qty: 135 | Fill #2

## 2019-02-11 MED FILL — SPIRONOLACTONE 25 MG TABLET: 25 | 90 days supply | Qty: 90 | Fill #0

## 2019-02-11 MED FILL — LISINOPRIL 5 MG TABLET: 5 | 90 days supply | Qty: 90 | Fill #0

## 2019-02-11 MED FILL — JANUMET 50-1,000 MG TABLET: 50-1000 | 90 days supply | Qty: 180 | Fill #0

## 2019-02-11 MED FILL — ALPRAZOLAM 0.5 MG TABS: 0.5 | 8 days supply | Qty: 15 | Fill #0

## 2019-05-11 MED FILL — SPIRONOLACTONE 25 MG TABS: 25 | 90 days supply | Qty: 90 | Fill #0

## 2019-05-11 MED FILL — LISINOPRIL 5 MG TABLET: 5 | 90 days supply | Qty: 90 | Fill #0

## 2019-05-20 MED FILL — SERTRALINE HCL 100 MG TABS: 100 | 90 days supply | Qty: 135 | Fill #3

## 2019-08-12 ENCOUNTER — Other Ambulatory Visit (HOSPITAL_BASED_OUTPATIENT_CLINIC_OR_DEPARTMENT_OTHER): Payer: Self-pay | Admitting: Family Medicine

## 2019-08-12 MED FILL — ALPRAZolam 0.5 MG TABS: 0.5 | 8 days supply | Qty: 15 | Fill #0

## 2019-08-12 MED FILL — ULTICARE PEN NDL 8MM 31G: 31G X 8 MM | 90 days supply | Qty: 100 | Fill #0

## 2019-08-12 MED FILL — metFORMIN HCL ER 500 MG TB2: 500 | 90 days supply | Qty: 270 | Fill #0

## 2019-08-12 MED FILL — SPIRONOLACTONE 25 MG TABS: 25 | 90 days supply | Qty: 90 | Fill #0

## 2019-08-12 MED FILL — SERTRALINE HCL 100 MG TABS: 100 | 90 days supply | Qty: 90 | Fill #0

## 2019-08-12 MED FILL — LISINOPRIL 5 MG TABLET: 5 | 90 days supply | Qty: 90 | Fill #0

## 2019-08-12 MED FILL — FAMOTIDINE 20 MG TABS: 20 | 90 days supply | Qty: 180 | Fill #0

## 2019-08-12 MED FILL — VICTOZA 18 MG/3 ML INJECT P: 18 | 90 days supply | Qty: 27 | Fill #0

## 2019-08-12 MED FILL — MELOXICAM 15 MG TABLET: 15 | 90 days supply | Qty: 90 | Fill #0

## 2019-11-11 MED FILL — FAMOTIDINE 20 MG TABS: 20 | 90 days supply | Qty: 180 | Fill #1

## 2019-11-11 MED FILL — SERTRALINE HCL 100 MG TABS: 100 | 90 days supply | Qty: 90 | Fill #1

## 2019-11-11 MED FILL — ALPRAZolam 0.5 MG TABS: 0.5 | 15 days supply | Qty: 15 | Fill #0

## 2019-11-11 MED FILL — ULTICARE PEN NDL 8MM 31G: 31G X 8 MM | 90 days supply | Qty: 100 | Fill #1

## 2019-11-11 MED FILL — SPIRONOLACTONE 25 MG TABS: 25 | 90 days supply | Qty: 90 | Fill #1

## 2019-11-11 MED FILL — METFORMIN HCL ER 500 MG TAB: 500 | 90 days supply | Qty: 270 | Fill #1

## 2019-11-11 MED FILL — LISINOPRIL 5 MG TABLET: 5 | 90 days supply | Qty: 90 | Fill #1

## 2019-11-11 MED FILL — VICTOZA 18 MG/3 ML INJECT P: 18 | 90 days supply | Qty: 27 | Fill #0

## 2019-11-11 MED FILL — MELOXICAM 15 MG TABLET: 15 | 90 days supply | Qty: 90 | Fill #1

## 2019-12-05 MED FILL — clonazePAM 0.5 MG TABS: 0.5 | 30 days supply | Qty: 15 | Fill #0

## 2019-12-05 MED FILL — ZOLPIDEM TARTRATE 5 MG TAB: 5 | 30 days supply | Qty: 30 | Fill #0

## 2020-01-11 MED FILL — ZOLPIDEM TARTRATE 5 MG TAB: 5 | 30 days supply | Qty: 30 | Fill #0

## 2020-01-24 MED FILL — ALBUTEROL SULFATE HFA 108 (: 108 (90 BAS | 17 days supply | Qty: 18 | Fill #0

## 2020-01-24 MED FILL — EZETIMIBE 10 MG TABS: 10 | 90 days supply | Qty: 90 | Fill #0

## 2020-01-24 MED FILL — SERTRALINE HCL 100 MG TABS: 100 | 90 days supply | Qty: 180 | Fill #0

## 2020-02-08 MED FILL — ZOLPIDEM TARTRATE 5 MG TAB: 5 | 30 days supply | Qty: 30 | Fill #0

## 2020-02-16 MED FILL — LISINOPRIL 5 MG TABLET: 5 | 90 days supply | Qty: 90 | Fill #2

## 2020-02-16 MED FILL — SPIRONOLACTONE 25 MG TABS: 25 | 90 days supply | Qty: 90 | Fill #2

## 2020-03-12 ENCOUNTER — Other Ambulatory Visit (HOSPITAL_BASED_OUTPATIENT_CLINIC_OR_DEPARTMENT_OTHER): Payer: Self-pay | Admitting: Family Medicine

## 2020-03-12 MED FILL — ZOLPIDEM TARTRATE 5 MG TAB: 5 | 30 days supply | Qty: 30 | Fill #0

## 2020-04-06 ENCOUNTER — Other Ambulatory Visit (HOSPITAL_BASED_OUTPATIENT_CLINIC_OR_DEPARTMENT_OTHER): Payer: Self-pay | Admitting: Family Medicine

## 2020-04-06 MED FILL — SERTRALINE HCL 100 MG TABS: 100 | 90 days supply | Qty: 180 | Fill #0

## 2020-04-06 MED FILL — FAMOTIDINE 20 MG TABS: 20 | 90 days supply | Qty: 180 | Fill #0

## 2020-04-06 MED FILL — ZOLPIDEM TARTRATE 10 MG TAB: 10 | 30 days supply | Qty: 30 | Fill #0

## 2020-04-06 MED FILL — VICTOZA 18 MG/3 ML INJECT P: 18 | 90 days supply | Qty: 18 | Fill #0

## 2020-04-06 MED FILL — metFORMIN HCL ER 500 MG TB2: 500 | 90 days supply | Qty: 270 | Fill #0

## 2020-04-07 ENCOUNTER — Other Ambulatory Visit (HOSPITAL_BASED_OUTPATIENT_CLINIC_OR_DEPARTMENT_OTHER): Payer: Self-pay | Admitting: Family Medicine

## 2020-04-09 MED FILL — LANTUS SOLOSTAR 100 UNITS/M: 100 | 90 days supply | Qty: 9 | Fill #0

## 2020-04-09 MED FILL — VITAMIN B-12 1000 MCG TABS: 1000 | 100 days supply | Qty: 100 | Fill #0

## 2020-04-17 MED FILL — MELOXICAM 15 MG TABLET: 15 | 90 days supply | Qty: 90 | Fill #2

## 2020-04-25 ENCOUNTER — Other Ambulatory Visit (HOSPITAL_BASED_OUTPATIENT_CLINIC_OR_DEPARTMENT_OTHER): Payer: Self-pay | Admitting: Family Medicine

## 2020-04-25 MED FILL — FLUTICASONE PROP 50 MCG SPR: 50 | 30 days supply | Qty: 16 | Fill #0

## 2020-04-25 MED FILL — AMOX-CLAV 875-125 MG TABLET: 875-125 | 10 days supply | Qty: 20 | Fill #0

## 2020-05-07 ENCOUNTER — Other Ambulatory Visit (HOSPITAL_BASED_OUTPATIENT_CLINIC_OR_DEPARTMENT_OTHER): Payer: Self-pay | Admitting: Family Medicine

## 2020-05-07 MED FILL — ZOLPIDEM TARTRATE 10 MG TAB: 10 | 30 days supply | Qty: 30 | Fill #0

## 2020-05-17 MED FILL — SPIRONOLACTONE 25 MG TABS: 25 | 90 days supply | Qty: 90 | Fill #3

## 2020-05-17 MED FILL — LISINOPRIL 5 MG TABLET: 5 | 90 days supply | Qty: 90 | Fill #3

## 2020-06-06 ENCOUNTER — Other Ambulatory Visit (HOSPITAL_BASED_OUTPATIENT_CLINIC_OR_DEPARTMENT_OTHER): Payer: Self-pay | Admitting: Internal Medicine

## 2020-06-06 MED FILL — ZOLPIDEM TARTRATE 10 MG TAB: 10 | 30 days supply | Qty: 30 | Fill #0

## 2020-07-04 ENCOUNTER — Other Ambulatory Visit (HOSPITAL_BASED_OUTPATIENT_CLINIC_OR_DEPARTMENT_OTHER): Payer: Self-pay

## 2020-07-04 MED FILL — LANTUS SOLOSTAR 100 UNITS/M: 100 | 90 days supply | Qty: 9 | Fill #1

## 2020-07-04 MED FILL — ZOLPIDEM TARTRATE 10 MG TAB: 10 | 30 days supply | Qty: 30 | Fill #0

## 2020-07-17 MED FILL — VITAMIN B-12 1000 MCG TABS: 1000 | 100 days supply | Qty: 100 | Fill #1

## 2020-07-17 MED FILL — MELOXICAM 15 MG TABLET: 15 | 90 days supply | Qty: 90 | Fill #3

## 2020-07-24 ENCOUNTER — Other Ambulatory Visit (HOSPITAL_BASED_OUTPATIENT_CLINIC_OR_DEPARTMENT_OTHER): Payer: Self-pay | Admitting: Family Medicine

## 2020-07-24 MED FILL — SERTRALINE HCL 100 MG TABS: 100 | 90 days supply | Qty: 180 | Fill #0

## 2020-07-27 ENCOUNTER — Other Ambulatory Visit (HOSPITAL_BASED_OUTPATIENT_CLINIC_OR_DEPARTMENT_OTHER): Payer: Self-pay | Admitting: Family Medicine

## 2020-07-27 MED FILL — clonazePAM 0.5 MG TABS: 0.5 | 30 days supply | Qty: 15 | Fill #0

## 2020-07-31 MED FILL — metFORMIN HCL ER 500 MG TB2: 500 | 90 days supply | Qty: 270 | Fill #1

## 2020-08-02 ENCOUNTER — Other Ambulatory Visit (HOSPITAL_COMMUNITY): Payer: Self-pay | Admitting: Psychiatry

## 2020-08-02 MED FILL — ESZOPICLONE 3 MG TABS: 3 | 30 days supply | Qty: 30 | Fill #0

## 2020-08-15 ENCOUNTER — Other Ambulatory Visit (HOSPITAL_BASED_OUTPATIENT_CLINIC_OR_DEPARTMENT_OTHER): Payer: Self-pay | Admitting: Family Medicine

## 2020-08-15 MED FILL — SPIRONOLACTONE 25 MG TABS: 25 | 90 days supply | Qty: 90 | Fill #0

## 2020-08-15 MED FILL — LISINOPRIL 5 MG TABLET: 5 | 90 days supply | Qty: 90 | Fill #0

## 2020-08-17 MED FILL — LANTUS SOLOSTAR 100 UNITS/M: 100 | 75 days supply | Qty: 15 | Fill #0

## 2020-08-23 ENCOUNTER — Other Ambulatory Visit (HOSPITAL_BASED_OUTPATIENT_CLINIC_OR_DEPARTMENT_OTHER): Payer: Self-pay | Admitting: Family Medicine

## 2020-09-04 ENCOUNTER — Other Ambulatory Visit (HOSPITAL_COMMUNITY): Payer: Self-pay | Admitting: Psychiatry

## 2020-09-04 MED FILL — ESZOPICLONE 3 MG TABS: 3 | 30 days supply | Qty: 30 | Fill #0

## 2020-09-10 ENCOUNTER — Other Ambulatory Visit (HOSPITAL_BASED_OUTPATIENT_CLINIC_OR_DEPARTMENT_OTHER): Payer: Self-pay

## 2020-09-10 MED ORDER — WELLBUTRIN XL 150 MG PO TB24
ORAL_TABLET | ORAL | 0 refills | Status: DC
  Filled 2020-09-10: qty 30, 30d supply, fill #0

## 2020-09-10 MED ORDER — SERTRALINE HCL 100 MG PO TABS
ORAL_TABLET | ORAL | 0 refills | Status: DC
  Filled 2020-09-10 – 2020-10-25 (×2): qty 180, 90d supply, fill #0

## 2020-09-11 ENCOUNTER — Other Ambulatory Visit (HOSPITAL_BASED_OUTPATIENT_CLINIC_OR_DEPARTMENT_OTHER): Payer: Self-pay

## 2020-09-12 ENCOUNTER — Other Ambulatory Visit (HOSPITAL_BASED_OUTPATIENT_CLINIC_OR_DEPARTMENT_OTHER): Payer: Self-pay

## 2020-09-13 ENCOUNTER — Other Ambulatory Visit (HOSPITAL_BASED_OUTPATIENT_CLINIC_OR_DEPARTMENT_OTHER): Payer: Self-pay

## 2020-09-27 ENCOUNTER — Other Ambulatory Visit (HOSPITAL_BASED_OUTPATIENT_CLINIC_OR_DEPARTMENT_OTHER): Payer: Self-pay

## 2020-09-27 MED ORDER — INSULIN PEN NEEDLE 31G X 8 MM MISC
2 refills | Status: DC
  Filled 2020-09-27: qty 100, 90d supply, fill #0

## 2020-10-03 ENCOUNTER — Other Ambulatory Visit (HOSPITAL_BASED_OUTPATIENT_CLINIC_OR_DEPARTMENT_OTHER): Payer: Self-pay

## 2020-10-03 MED ORDER — WELLBUTRIN XL 150 MG PO TB24
ORAL_TABLET | ORAL | 2 refills | Status: DC
  Filled 2020-10-03 – 2020-10-04 (×3): qty 30, 30d supply, fill #0
  Filled 2020-11-07: qty 30, 30d supply, fill #1

## 2020-10-03 MED FILL — Eszopiclone Tab 3 MG: ORAL | 30 days supply | Qty: 30 | Fill #0 | Status: AC

## 2020-10-04 ENCOUNTER — Other Ambulatory Visit (HOSPITAL_BASED_OUTPATIENT_CLINIC_OR_DEPARTMENT_OTHER): Payer: Self-pay

## 2020-10-05 ENCOUNTER — Other Ambulatory Visit (HOSPITAL_BASED_OUTPATIENT_CLINIC_OR_DEPARTMENT_OTHER): Payer: Self-pay

## 2020-10-08 ENCOUNTER — Other Ambulatory Visit (HOSPITAL_BASED_OUTPATIENT_CLINIC_OR_DEPARTMENT_OTHER): Payer: Self-pay

## 2020-10-10 ENCOUNTER — Other Ambulatory Visit (HOSPITAL_BASED_OUTPATIENT_CLINIC_OR_DEPARTMENT_OTHER): Payer: Self-pay

## 2020-10-15 ENCOUNTER — Other Ambulatory Visit (HOSPITAL_BASED_OUTPATIENT_CLINIC_OR_DEPARTMENT_OTHER): Payer: Self-pay

## 2020-10-15 MED ORDER — MELOXICAM 15 MG PO TABS
ORAL_TABLET | ORAL | 0 refills | Status: DC
  Filled 2020-10-15: qty 90, 90d supply, fill #0

## 2020-10-25 ENCOUNTER — Other Ambulatory Visit (HOSPITAL_BASED_OUTPATIENT_CLINIC_OR_DEPARTMENT_OTHER): Payer: Self-pay

## 2020-10-25 MED FILL — Insulin Glargine Soln Pen-Injector 100 Unit/ML: SUBCUTANEOUS | 75 days supply | Qty: 15 | Fill #0 | Status: AC

## 2020-10-30 ENCOUNTER — Other Ambulatory Visit (HOSPITAL_BASED_OUTPATIENT_CLINIC_OR_DEPARTMENT_OTHER): Payer: Self-pay

## 2020-10-30 MED ORDER — VITAMIN B-12 1000 MCG PO TABS
ORAL_TABLET | ORAL | 3 refills | Status: DC
  Filled 2020-10-30: qty 100, 100d supply, fill #0

## 2020-10-30 MED ORDER — METFORMIN HCL ER 500 MG PO TB24
ORAL_TABLET | ORAL | 1 refills | Status: DC
  Filled 2020-10-30: qty 270, 90d supply, fill #0
  Filled 2021-01-29: qty 270, 90d supply, fill #1

## 2020-10-31 ENCOUNTER — Other Ambulatory Visit (HOSPITAL_BASED_OUTPATIENT_CLINIC_OR_DEPARTMENT_OTHER): Payer: Self-pay

## 2020-10-31 MED ORDER — WELLBUTRIN XL 150 MG PO TB24
ORAL_TABLET | ORAL | 2 refills | Status: DC
  Filled 2020-10-31: qty 30, 30d supply, fill #0

## 2020-10-31 MED ORDER — ESZOPICLONE 3 MG PO TABS
ORAL_TABLET | ORAL | 2 refills | Status: DC
  Filled 2020-10-31: qty 30, 30d supply, fill #0
  Filled 2020-12-12: qty 30, 30d supply, fill #1
  Filled 2021-01-08: qty 30, 30d supply, fill #2

## 2020-11-07 ENCOUNTER — Other Ambulatory Visit (HOSPITAL_BASED_OUTPATIENT_CLINIC_OR_DEPARTMENT_OTHER): Payer: Self-pay

## 2020-11-08 ENCOUNTER — Other Ambulatory Visit (HOSPITAL_BASED_OUTPATIENT_CLINIC_OR_DEPARTMENT_OTHER): Payer: Self-pay

## 2020-11-08 ENCOUNTER — Ambulatory Visit: Payer: 59 | Attending: Internal Medicine

## 2020-11-08 DIAGNOSIS — Z23 Encounter for immunization: Secondary | ICD-10-CM

## 2020-11-08 MED ORDER — TIZANIDINE HCL 4 MG PO TABS
ORAL_TABLET | ORAL | 2 refills | Status: DC
  Filled 2020-11-08: qty 30, 30d supply, fill #0
  Filled 2021-01-25: qty 30, 30d supply, fill #1

## 2020-11-08 NOTE — Progress Notes (Signed)
   Covid-19 Vaccination Clinic  Name:  Lisa [name]    MRN: [id number] DOB: 12-Aug-1960  11/08/2020  Ms. Rosander was observed post Covid-19 immunization for 15 minutes without incident. She was provided with Vaccine Information Sheet and instruction to access the V-Safe system.   Ms. Spellman was instructed to call 911 with any severe reactions post vaccine: Marland Kitchen Difficulty breathing  . Swelling of face and throat  . A fast heartbeat  . A bad rash all over body  . Dizziness and weakness   Immunizations Administered    Name Date Dose VIS Date Route   PFIZER Comrnaty(Gray TOP) Covid-19 Vaccine 11/08/2020 12:57 PM 0.3 mL 05/17/2020 Intramuscular   Manufacturer: [place]   Lot: [id number]   [place]: [phone]

## 2020-11-09 ENCOUNTER — Other Ambulatory Visit (HOSPITAL_BASED_OUTPATIENT_CLINIC_OR_DEPARTMENT_OTHER): Payer: Self-pay

## 2020-11-09 MED FILL — Liraglutide Soln Pen-injector 18 MG/3ML (6 MG/ML): SUBCUTANEOUS | 60 days supply | Qty: 18 | Fill #0 | Status: AC

## 2020-11-12 ENCOUNTER — Other Ambulatory Visit (HOSPITAL_BASED_OUTPATIENT_CLINIC_OR_DEPARTMENT_OTHER): Payer: Self-pay

## 2020-11-12 MED ORDER — PFIZER-BIONT COVID-19 VAC-TRIS 30 MCG/0.3ML IM SUSP
INTRAMUSCULAR | 0 refills | Status: DC
  Filled 2020-11-12: qty 0.3, 1d supply, fill #0

## 2020-11-16 ENCOUNTER — Other Ambulatory Visit (HOSPITAL_BASED_OUTPATIENT_CLINIC_OR_DEPARTMENT_OTHER): Payer: Self-pay

## 2020-11-16 MED FILL — Spironolactone Tab 25 MG: ORAL | 90 days supply | Qty: 90 | Fill #0 | Status: AC

## 2020-11-16 MED FILL — Lisinopril Tab 5 MG: ORAL | 90 days supply | Qty: 90 | Fill #0 | Status: AC

## 2020-11-22 ENCOUNTER — Other Ambulatory Visit (HOSPITAL_BASED_OUTPATIENT_CLINIC_OR_DEPARTMENT_OTHER): Payer: Self-pay

## 2020-11-22 MED ORDER — METHYLPREDNISOLONE 4 MG PO TBPK
ORAL_TABLET | ORAL | 0 refills | Status: DC
  Filled 2020-11-22: qty 21, 6d supply, fill #0

## 2020-11-22 MED ORDER — DICLOFENAC SODIUM 75 MG PO TBEC
DELAYED_RELEASE_TABLET | ORAL | 0 refills | Status: DC
  Filled 2020-11-22: qty 60, 30d supply, fill #0

## 2020-12-11 ENCOUNTER — Other Ambulatory Visit (HOSPITAL_BASED_OUTPATIENT_CLINIC_OR_DEPARTMENT_OTHER): Payer: Self-pay

## 2020-12-11 MED ORDER — WELLBUTRIN XL 150 MG PO TB24
ORAL_TABLET | ORAL | 2 refills | Status: DC
  Filled 2020-12-11: qty 30, 30d supply, fill #0
  Filled 2021-01-08: qty 30, 30d supply, fill #1

## 2020-12-12 ENCOUNTER — Other Ambulatory Visit (HOSPITAL_BASED_OUTPATIENT_CLINIC_OR_DEPARTMENT_OTHER): Payer: Self-pay

## 2020-12-14 ENCOUNTER — Other Ambulatory Visit (HOSPITAL_BASED_OUTPATIENT_CLINIC_OR_DEPARTMENT_OTHER): Payer: Self-pay

## 2020-12-14 MED ORDER — NICOTINE 21 MG/24HR TD PT24
MEDICATED_PATCH | TRANSDERMAL | 1 refills | Status: DC
  Filled 2020-12-14: qty 28, 28d supply, fill #0

## 2020-12-18 ENCOUNTER — Other Ambulatory Visit (HOSPITAL_BASED_OUTPATIENT_CLINIC_OR_DEPARTMENT_OTHER): Payer: Self-pay

## 2020-12-26 ENCOUNTER — Other Ambulatory Visit (HOSPITAL_BASED_OUTPATIENT_CLINIC_OR_DEPARTMENT_OTHER): Payer: Self-pay

## 2020-12-26 MED ORDER — TRIMO-SAN 0.025 % VA GEL
VAGINAL | 10 refills | Status: DC
  Filled 2020-12-26: qty 113.4, 30d supply, fill #0
  Filled 2020-12-26: qty 113.4, 10d supply, fill #0

## 2020-12-27 ENCOUNTER — Other Ambulatory Visit (HOSPITAL_BASED_OUTPATIENT_CLINIC_OR_DEPARTMENT_OTHER): Payer: Self-pay

## 2020-12-27 MED ORDER — DICLOFENAC SODIUM 75 MG PO TBEC
DELAYED_RELEASE_TABLET | ORAL | 0 refills | Status: DC
  Filled 2020-12-27: qty 180, 90d supply, fill #0

## 2020-12-27 MED ORDER — ULTICARE SHORT PEN NEEDLES 31G X 8 MM MISC
2 refills | Status: DC
  Filled 2020-12-27: qty 100, 90d supply, fill #0
  Filled 2021-04-18: qty 100, 90d supply, fill #1

## 2020-12-27 MED ORDER — CLONAZEPAM 0.5 MG PO TABS
ORAL_TABLET | ORAL | 0 refills | Status: DC
  Filled 2020-12-27: qty 15, 30d supply, fill #0

## 2020-12-28 ENCOUNTER — Other Ambulatory Visit (HOSPITAL_BASED_OUTPATIENT_CLINIC_OR_DEPARTMENT_OTHER): Payer: Self-pay

## 2021-01-02 ENCOUNTER — Other Ambulatory Visit (HOSPITAL_BASED_OUTPATIENT_CLINIC_OR_DEPARTMENT_OTHER): Payer: Self-pay

## 2021-01-02 MED ORDER — ESZOPICLONE 3 MG PO TABS
ORAL_TABLET | ORAL | 1 refills | Status: DC
  Filled 2021-01-02 – 2021-02-18 (×2): qty 30, 30d supply, fill #0
  Filled 2021-03-18: qty 30, 30d supply, fill #1

## 2021-01-08 ENCOUNTER — Other Ambulatory Visit (HOSPITAL_BASED_OUTPATIENT_CLINIC_OR_DEPARTMENT_OTHER): Payer: Self-pay

## 2021-01-09 ENCOUNTER — Other Ambulatory Visit (HOSPITAL_BASED_OUTPATIENT_CLINIC_OR_DEPARTMENT_OTHER): Payer: Self-pay

## 2021-01-14 ENCOUNTER — Other Ambulatory Visit (HOSPITAL_BASED_OUTPATIENT_CLINIC_OR_DEPARTMENT_OTHER): Payer: Self-pay

## 2021-01-15 ENCOUNTER — Other Ambulatory Visit (HOSPITAL_BASED_OUTPATIENT_CLINIC_OR_DEPARTMENT_OTHER): Payer: Self-pay

## 2021-01-15 MED ORDER — LANTUS SOLOSTAR 100 UNIT/ML ~~LOC~~ SOPN
PEN_INJECTOR | SUBCUTANEOUS | 1 refills | Status: DC
  Filled 2021-01-15: qty 15, 75d supply, fill #0
  Filled 2021-03-28: qty 15, 75d supply, fill #1

## 2021-01-16 ENCOUNTER — Other Ambulatory Visit (HOSPITAL_BASED_OUTPATIENT_CLINIC_OR_DEPARTMENT_OTHER): Payer: Self-pay

## 2021-01-17 ENCOUNTER — Other Ambulatory Visit (HOSPITAL_BASED_OUTPATIENT_CLINIC_OR_DEPARTMENT_OTHER): Payer: Self-pay

## 2021-01-17 MED ORDER — SERTRALINE HCL 100 MG PO TABS
ORAL_TABLET | ORAL | 1 refills | Status: DC
  Filled 2021-01-17: qty 180, 90d supply, fill #0
  Filled 2021-04-18: qty 180, 90d supply, fill #1

## 2021-01-17 MED ORDER — WELLBUTRIN XL 300 MG PO TB24
ORAL_TABLET | ORAL | 2 refills | Status: DC
  Filled 2021-01-17 – 2021-01-18 (×2): qty 30, 30d supply, fill #0
  Filled 2021-02-27: qty 30, 30d supply, fill #1
  Filled 2021-04-01: qty 30, 30d supply, fill #2

## 2021-01-18 ENCOUNTER — Other Ambulatory Visit (HOSPITAL_BASED_OUTPATIENT_CLINIC_OR_DEPARTMENT_OTHER): Payer: Self-pay

## 2021-01-21 ENCOUNTER — Other Ambulatory Visit (HOSPITAL_BASED_OUTPATIENT_CLINIC_OR_DEPARTMENT_OTHER): Payer: Self-pay

## 2021-01-22 ENCOUNTER — Other Ambulatory Visit (HOSPITAL_BASED_OUTPATIENT_CLINIC_OR_DEPARTMENT_OTHER): Payer: Self-pay

## 2021-01-25 ENCOUNTER — Other Ambulatory Visit (HOSPITAL_BASED_OUTPATIENT_CLINIC_OR_DEPARTMENT_OTHER): Payer: Self-pay

## 2021-01-29 ENCOUNTER — Other Ambulatory Visit (HOSPITAL_BASED_OUTPATIENT_CLINIC_OR_DEPARTMENT_OTHER): Payer: Self-pay

## 2021-02-12 ENCOUNTER — Other Ambulatory Visit (HOSPITAL_BASED_OUTPATIENT_CLINIC_OR_DEPARTMENT_OTHER): Payer: Self-pay

## 2021-02-15 ENCOUNTER — Other Ambulatory Visit (HOSPITAL_BASED_OUTPATIENT_CLINIC_OR_DEPARTMENT_OTHER): Payer: Self-pay

## 2021-02-15 MED FILL — Spironolactone Tab 25 MG: ORAL | 90 days supply | Qty: 90 | Fill #1 | Status: AC

## 2021-02-18 ENCOUNTER — Other Ambulatory Visit (HOSPITAL_BASED_OUTPATIENT_CLINIC_OR_DEPARTMENT_OTHER): Payer: Self-pay

## 2021-02-27 ENCOUNTER — Other Ambulatory Visit (HOSPITAL_BASED_OUTPATIENT_CLINIC_OR_DEPARTMENT_OTHER): Payer: Self-pay

## 2021-02-27 MED ORDER — ESZOPICLONE 3 MG PO TABS
ORAL_TABLET | ORAL | 2 refills | Status: DC
  Filled 2021-04-16: qty 30, 30d supply, fill #0

## 2021-02-27 MED ORDER — TIZANIDINE HCL 4 MG PO TABS
ORAL_TABLET | ORAL | 2 refills | Status: DC
  Filled 2021-02-27: qty 45, 30d supply, fill #0

## 2021-02-27 MED ORDER — CLONAZEPAM 0.5 MG PO TABS
ORAL_TABLET | ORAL | 0 refills | Status: DC
  Filled 2021-02-27: qty 10, 20d supply, fill #0

## 2021-02-28 ENCOUNTER — Other Ambulatory Visit (HOSPITAL_BASED_OUTPATIENT_CLINIC_OR_DEPARTMENT_OTHER): Payer: Self-pay

## 2021-02-28 MED ORDER — VICTOZA 18 MG/3ML ~~LOC~~ SOPN
PEN_INJECTOR | SUBCUTANEOUS | 1 refills | Status: DC
  Filled 2021-02-28: qty 18, 90d supply, fill #0
  Filled 2021-05-28: qty 18, 90d supply, fill #1

## 2021-03-01 ENCOUNTER — Other Ambulatory Visit (HOSPITAL_BASED_OUTPATIENT_CLINIC_OR_DEPARTMENT_OTHER): Payer: Self-pay

## 2021-03-18 ENCOUNTER — Other Ambulatory Visit (HOSPITAL_BASED_OUTPATIENT_CLINIC_OR_DEPARTMENT_OTHER): Payer: Self-pay

## 2021-03-28 ENCOUNTER — Other Ambulatory Visit (HOSPITAL_BASED_OUTPATIENT_CLINIC_OR_DEPARTMENT_OTHER): Payer: Self-pay

## 2021-03-28 MED ORDER — DICLOFENAC SODIUM 75 MG PO TBEC
75.0000 mg | DELAYED_RELEASE_TABLET | Freq: Two times a day (BID) | ORAL | 0 refills | Status: DC
  Filled 2021-03-28: qty 180, 90d supply, fill #0

## 2021-04-01 ENCOUNTER — Other Ambulatory Visit (HOSPITAL_BASED_OUTPATIENT_CLINIC_OR_DEPARTMENT_OTHER): Payer: Self-pay

## 2021-04-01 MED ORDER — NAC 600 MG PO CAPS
ORAL_CAPSULE | ORAL | 0 refills | Status: DC
  Filled 2021-04-01: qty 100, 50d supply, fill #0

## 2021-04-01 MED ORDER — CYCLOBENZAPRINE HCL 10 MG PO TABS
ORAL_TABLET | ORAL | 0 refills | Status: DC
  Filled 2021-04-01: qty 15, 15d supply, fill #0

## 2021-04-02 ENCOUNTER — Other Ambulatory Visit (HOSPITAL_BASED_OUTPATIENT_CLINIC_OR_DEPARTMENT_OTHER): Payer: Self-pay

## 2021-04-16 ENCOUNTER — Other Ambulatory Visit (HOSPITAL_BASED_OUTPATIENT_CLINIC_OR_DEPARTMENT_OTHER): Payer: Self-pay

## 2021-04-19 ENCOUNTER — Other Ambulatory Visit (HOSPITAL_BASED_OUTPATIENT_CLINIC_OR_DEPARTMENT_OTHER): Payer: Self-pay

## 2021-04-29 ENCOUNTER — Other Ambulatory Visit (HOSPITAL_BASED_OUTPATIENT_CLINIC_OR_DEPARTMENT_OTHER): Payer: Self-pay

## 2021-04-29 MED ORDER — METFORMIN HCL ER 500 MG PO TB24
ORAL_TABLET | ORAL | 1 refills | Status: DC
  Filled 2021-04-29: qty 270, 90d supply, fill #0

## 2021-04-30 ENCOUNTER — Other Ambulatory Visit (HOSPITAL_BASED_OUTPATIENT_CLINIC_OR_DEPARTMENT_OTHER): Payer: Self-pay

## 2021-04-30 MED ORDER — WELLBUTRIN XL 300 MG PO TB24
ORAL_TABLET | ORAL | 2 refills | Status: DC
  Filled 2021-04-30: qty 30, 30d supply, fill #0
  Filled 2021-05-29: qty 30, 30d supply, fill #1

## 2021-05-01 ENCOUNTER — Other Ambulatory Visit (HOSPITAL_BASED_OUTPATIENT_CLINIC_OR_DEPARTMENT_OTHER): Payer: Self-pay

## 2021-05-03 ENCOUNTER — Other Ambulatory Visit (HOSPITAL_BASED_OUTPATIENT_CLINIC_OR_DEPARTMENT_OTHER): Payer: Self-pay

## 2021-05-03 MED ORDER — LIDOCAINE 5 % EX PTCH
1.0000 | MEDICATED_PATCH | Freq: Every day | CUTANEOUS | 0 refills | Status: DC
  Filled 2021-05-03: qty 30, 30d supply, fill #0

## 2021-05-03 MED ORDER — CYCLOBENZAPRINE HCL 5 MG PO TABS
5.0000 mg | ORAL_TABLET | Freq: Every day | ORAL | 0 refills | Status: DC
  Filled 2021-05-03: qty 7, 7d supply, fill #0

## 2021-05-06 ENCOUNTER — Other Ambulatory Visit (HOSPITAL_BASED_OUTPATIENT_CLINIC_OR_DEPARTMENT_OTHER): Payer: Self-pay

## 2021-05-07 ENCOUNTER — Other Ambulatory Visit (HOSPITAL_BASED_OUTPATIENT_CLINIC_OR_DEPARTMENT_OTHER): Payer: Self-pay

## 2021-05-08 ENCOUNTER — Other Ambulatory Visit (HOSPITAL_BASED_OUTPATIENT_CLINIC_OR_DEPARTMENT_OTHER): Payer: Self-pay

## 2021-05-09 ENCOUNTER — Other Ambulatory Visit (HOSPITAL_BASED_OUTPATIENT_CLINIC_OR_DEPARTMENT_OTHER): Payer: Self-pay

## 2021-05-13 ENCOUNTER — Other Ambulatory Visit (HOSPITAL_BASED_OUTPATIENT_CLINIC_OR_DEPARTMENT_OTHER): Payer: Self-pay

## 2021-05-13 MED ORDER — CLONAZEPAM 0.5 MG PO TABS
ORAL_TABLET | ORAL | 0 refills | Status: DC
  Filled 2021-05-13: qty 15, 15d supply, fill #0

## 2021-05-13 MED ORDER — CYCLOBENZAPRINE HCL 10 MG PO TABS
ORAL_TABLET | ORAL | 0 refills | Status: DC
  Filled 2021-05-13: qty 30, 30d supply, fill #0

## 2021-05-13 MED FILL — Spironolactone Tab 25 MG: ORAL | 90 days supply | Qty: 90 | Fill #2 | Status: AC

## 2021-05-14 ENCOUNTER — Other Ambulatory Visit (HOSPITAL_BASED_OUTPATIENT_CLINIC_OR_DEPARTMENT_OTHER): Payer: Self-pay

## 2021-05-20 ENCOUNTER — Other Ambulatory Visit (HOSPITAL_BASED_OUTPATIENT_CLINIC_OR_DEPARTMENT_OTHER): Payer: Self-pay

## 2021-05-20 MED ORDER — FENOFIBRATE 48 MG PO TABS
ORAL_TABLET | ORAL | 0 refills | Status: DC
  Filled 2021-05-20: qty 90, 90d supply, fill #0

## 2021-05-23 ENCOUNTER — Other Ambulatory Visit (HOSPITAL_BASED_OUTPATIENT_CLINIC_OR_DEPARTMENT_OTHER): Payer: Self-pay

## 2021-05-28 ENCOUNTER — Other Ambulatory Visit (HOSPITAL_BASED_OUTPATIENT_CLINIC_OR_DEPARTMENT_OTHER): Payer: Self-pay

## 2021-05-29 ENCOUNTER — Other Ambulatory Visit (HOSPITAL_BASED_OUTPATIENT_CLINIC_OR_DEPARTMENT_OTHER): Payer: Self-pay

## 2021-05-29 MED FILL — Lisinopril Tab 5 MG: ORAL | 90 days supply | Qty: 90 | Fill #1 | Status: AC

## 2021-05-30 ENCOUNTER — Other Ambulatory Visit (HOSPITAL_BASED_OUTPATIENT_CLINIC_OR_DEPARTMENT_OTHER): Payer: Self-pay

## 2021-06-11 ENCOUNTER — Other Ambulatory Visit: Payer: Self-pay

## 2021-06-11 ENCOUNTER — Inpatient Hospital Stay (HOSPITAL_COMMUNITY)
Admission: EM | Admit: 2021-06-11 | Discharge: 2021-06-20 | DRG: 871 | Disposition: A | Discharge status: Rehabilitation Facility | Payer: Commercial Managed Care - HMO | Attending: Student in an Organized Health Care Education/Training Program | Admitting: Student in an Organized Health Care Education/Training Program

## 2021-06-11 ENCOUNTER — Emergency Department (HOSPITAL_COMMUNITY): Payer: Commercial Managed Care - HMO

## 2021-06-11 ENCOUNTER — Encounter (HOSPITAL_COMMUNITY): Payer: Self-pay

## 2021-06-11 DIAGNOSIS — G9340 Encephalopathy, unspecified: Secondary | ICD-10-CM | POA: Diagnosis present

## 2021-06-11 DIAGNOSIS — E1169 Type 2 diabetes mellitus with other specified complication: Secondary | ICD-10-CM | POA: Diagnosis not present

## 2021-06-11 DIAGNOSIS — R209 Unspecified disturbances of skin sensation: Secondary | ICD-10-CM | POA: Diagnosis not present

## 2021-06-11 DIAGNOSIS — Z8261 Family history of arthritis: Secondary | ICD-10-CM

## 2021-06-11 DIAGNOSIS — R7401 Elevation of levels of liver transaminase levels: Secondary | ICD-10-CM | POA: Diagnosis not present

## 2021-06-11 DIAGNOSIS — R29810 Facial weakness: Secondary | ICD-10-CM | POA: Diagnosis not present

## 2021-06-11 DIAGNOSIS — E871 Hypo-osmolality and hyponatremia: Secondary | ICD-10-CM | POA: Diagnosis present

## 2021-06-11 DIAGNOSIS — L8991 Pressure ulcer of unspecified site, stage 1: Secondary | ICD-10-CM | POA: Diagnosis not present

## 2021-06-11 DIAGNOSIS — J9602 Acute respiratory failure with hypercapnia: Secondary | ICD-10-CM | POA: Diagnosis not present

## 2021-06-11 DIAGNOSIS — R6521 Severe sepsis with septic shock: Secondary | ICD-10-CM | POA: Diagnosis not present

## 2021-06-11 DIAGNOSIS — N816 Rectocele: Secondary | ICD-10-CM | POA: Diagnosis present

## 2021-06-11 DIAGNOSIS — E876 Hypokalemia: Secondary | ICD-10-CM | POA: Diagnosis not present

## 2021-06-11 DIAGNOSIS — E875 Hyperkalemia: Secondary | ICD-10-CM | POA: Diagnosis not present

## 2021-06-11 DIAGNOSIS — R509 Fever, unspecified: Secondary | ICD-10-CM

## 2021-06-11 DIAGNOSIS — F419 Anxiety disorder, unspecified: Secondary | ICD-10-CM | POA: Diagnosis present

## 2021-06-11 DIAGNOSIS — L89151 Pressure ulcer of sacral region, stage 1: Secondary | ICD-10-CM | POA: Diagnosis present

## 2021-06-11 DIAGNOSIS — R531 Weakness: Secondary | ICD-10-CM | POA: Diagnosis not present

## 2021-06-11 DIAGNOSIS — F5105 Insomnia due to other mental disorder: Secondary | ICD-10-CM | POA: Diagnosis present

## 2021-06-11 DIAGNOSIS — Z781 Physical restraint status: Secondary | ICD-10-CM | POA: Diagnosis not present

## 2021-06-11 DIAGNOSIS — G009 Bacterial meningitis, unspecified: Secondary | ICD-10-CM | POA: Diagnosis not present

## 2021-06-11 DIAGNOSIS — G9341 Metabolic encephalopathy: Secondary | ICD-10-CM | POA: Diagnosis present

## 2021-06-11 DIAGNOSIS — Z7984 Long term (current) use of oral hypoglycemic drugs: Secondary | ICD-10-CM

## 2021-06-11 DIAGNOSIS — Z79899 Other long term (current) drug therapy: Secondary | ICD-10-CM

## 2021-06-11 DIAGNOSIS — J329 Chronic sinusitis, unspecified: Secondary | ICD-10-CM | POA: Diagnosis not present

## 2021-06-11 DIAGNOSIS — H7093 Unspecified mastoiditis, bilateral: Secondary | ICD-10-CM | POA: Diagnosis present

## 2021-06-11 DIAGNOSIS — R451 Restlessness and agitation: Secondary | ICD-10-CM | POA: Diagnosis not present

## 2021-06-11 DIAGNOSIS — G4730 Sleep apnea, unspecified: Secondary | ICD-10-CM | POA: Diagnosis present

## 2021-06-11 DIAGNOSIS — G0490 Encephalitis and encephalomyelitis, unspecified: Secondary | ICD-10-CM | POA: Diagnosis present

## 2021-06-11 DIAGNOSIS — E1165 Type 2 diabetes mellitus with hyperglycemia: Secondary | ICD-10-CM | POA: Diagnosis present

## 2021-06-11 DIAGNOSIS — I10 Essential (primary) hypertension: Secondary | ICD-10-CM | POA: Diagnosis present

## 2021-06-11 DIAGNOSIS — G928 Other toxic encephalopathy: Secondary | ICD-10-CM | POA: Diagnosis present

## 2021-06-11 DIAGNOSIS — Z20822 Contact with and (suspected) exposure to covid-19: Secondary | ICD-10-CM | POA: Diagnosis present

## 2021-06-11 DIAGNOSIS — R4182 Altered mental status, unspecified: Secondary | ICD-10-CM | POA: Diagnosis present

## 2021-06-11 DIAGNOSIS — F05 Delirium due to known physiological condition: Secondary | ICD-10-CM | POA: Diagnosis not present

## 2021-06-11 DIAGNOSIS — Z83438 Family history of other disorder of lipoprotein metabolism and other lipidemia: Secondary | ICD-10-CM

## 2021-06-11 DIAGNOSIS — A419 Sepsis, unspecified organism: Secondary | ICD-10-CM | POA: Diagnosis not present

## 2021-06-11 DIAGNOSIS — M19011 Primary osteoarthritis, right shoulder: Secondary | ICD-10-CM | POA: Diagnosis present

## 2021-06-11 DIAGNOSIS — M1990 Unspecified osteoarthritis, unspecified site: Secondary | ICD-10-CM | POA: Diagnosis not present

## 2021-06-11 DIAGNOSIS — J9601 Acute respiratory failure with hypoxia: Secondary | ICD-10-CM | POA: Diagnosis not present

## 2021-06-11 DIAGNOSIS — Z888 Allergy status to other drugs, medicaments and biological substances status: Secondary | ICD-10-CM | POA: Diagnosis not present

## 2021-06-11 DIAGNOSIS — Z6841 Body Mass Index (BMI) 40.0 and over, adult: Secondary | ICD-10-CM

## 2021-06-11 DIAGNOSIS — R7881 Bacteremia: Secondary | ICD-10-CM | POA: Diagnosis not present

## 2021-06-11 DIAGNOSIS — G4700 Insomnia, unspecified: Secondary | ICD-10-CM | POA: Diagnosis present

## 2021-06-11 DIAGNOSIS — M25561 Pain in right knee: Secondary | ICD-10-CM | POA: Diagnosis present

## 2021-06-11 DIAGNOSIS — L8990 Pressure ulcer of unspecified site, unspecified stage: Secondary | ICD-10-CM | POA: Insufficient documentation

## 2021-06-11 DIAGNOSIS — N63 Unspecified lump in unspecified breast: Secondary | ICD-10-CM | POA: Diagnosis not present

## 2021-06-11 DIAGNOSIS — R2689 Other abnormalities of gait and mobility: Secondary | ICD-10-CM | POA: Diagnosis not present

## 2021-06-11 DIAGNOSIS — Z8249 Family history of ischemic heart disease and other diseases of the circulatory system: Secondary | ICD-10-CM

## 2021-06-11 DIAGNOSIS — R569 Unspecified convulsions: Secondary | ICD-10-CM | POA: Diagnosis not present

## 2021-06-11 DIAGNOSIS — E785 Hyperlipidemia, unspecified: Secondary | ICD-10-CM | POA: Diagnosis present

## 2021-06-11 DIAGNOSIS — G9600 Cerebrospinal fluid leak, unspecified: Secondary | ICD-10-CM | POA: Diagnosis present

## 2021-06-11 DIAGNOSIS — F32A Depression, unspecified: Secondary | ICD-10-CM | POA: Diagnosis present

## 2021-06-11 DIAGNOSIS — Z4659 Encounter for fitting and adjustment of other gastrointestinal appliance and device: Secondary | ICD-10-CM

## 2021-06-11 DIAGNOSIS — Z794 Long term (current) use of insulin: Secondary | ICD-10-CM

## 2021-06-11 DIAGNOSIS — K760 Fatty (change of) liver, not elsewhere classified: Secondary | ICD-10-CM | POA: Diagnosis present

## 2021-06-11 DIAGNOSIS — M170 Bilateral primary osteoarthritis of knee: Secondary | ICD-10-CM | POA: Diagnosis not present

## 2021-06-11 DIAGNOSIS — E669 Obesity, unspecified: Secondary | ICD-10-CM | POA: Diagnosis not present

## 2021-06-11 DIAGNOSIS — J9600 Acute respiratory failure, unspecified whether with hypoxia or hypercapnia: Secondary | ICD-10-CM | POA: Diagnosis not present

## 2021-06-11 DIAGNOSIS — G8929 Other chronic pain: Secondary | ICD-10-CM | POA: Diagnosis present

## 2021-06-11 DIAGNOSIS — A413 Sepsis due to Hemophilus influenzae: Secondary | ICD-10-CM | POA: Diagnosis not present

## 2021-06-11 DIAGNOSIS — Z833 Family history of diabetes mellitus: Secondary | ICD-10-CM

## 2021-06-11 DIAGNOSIS — M25562 Pain in left knee: Secondary | ICD-10-CM | POA: Diagnosis present

## 2021-06-11 DIAGNOSIS — F17210 Nicotine dependence, cigarettes, uncomplicated: Secondary | ICD-10-CM | POA: Diagnosis present

## 2021-06-11 DIAGNOSIS — Z978 Presence of other specified devices: Secondary | ICD-10-CM

## 2021-06-11 DIAGNOSIS — E119 Type 2 diabetes mellitus without complications: Secondary | ICD-10-CM

## 2021-06-11 DIAGNOSIS — H7090 Unspecified mastoiditis, unspecified ear: Secondary | ICD-10-CM | POA: Diagnosis not present

## 2021-06-11 DIAGNOSIS — R5381 Other malaise: Secondary | ICD-10-CM | POA: Diagnosis not present

## 2021-06-11 DIAGNOSIS — L89156 Pressure-induced deep tissue damage of sacral region: Secondary | ICD-10-CM | POA: Diagnosis present

## 2021-06-11 DIAGNOSIS — Z7982 Long term (current) use of aspirin: Secondary | ICD-10-CM

## 2021-06-11 DIAGNOSIS — G039 Meningitis, unspecified: Secondary | ICD-10-CM

## 2021-06-11 HISTORY — DX: Meningitis, unspecified: G03.9

## 2021-06-11 LAB — COMPREHENSIVE METABOLIC PANEL
ALT: 16 U/L (ref 0–44)
AST: 16 U/L (ref 15–41)
Albumin: 3.6 g/dL (ref 3.5–5.0)
Alkaline Phosphatase: 52 U/L (ref 38–126)
Anion gap: 11 (ref 5–15)
BUN: 10 mg/dL (ref 8–23)
CO2: 23 mmol/L (ref 22–32)
Calcium: 8.7 mg/dL — ABNORMAL LOW (ref 8.9–10.3)
Chloride: 99 mmol/L (ref 98–111)
Creatinine, Ser: 0.96 mg/dL (ref 0.44–1.00)
GFR, Estimated: >60 mL/min (ref >60)
Glucose, Bld: 210 mg/dL — ABNORMAL HIGH (ref 70–99)
Potassium: 3.6 mmol/L (ref 3.5–5.1)
Sodium: 133 mmol/L — ABNORMAL LOW (ref 135–145)
Total Bilirubin: 0.7 mg/dL (ref 0.3–1.2)
Total Protein: 7.1 g/dL (ref 6.5–8.1)

## 2021-06-11 LAB — BLOOD GAS, VENOUS
Acid-base deficit: 1.2 mmol/L (ref 0.0–2.0)
Bicarbonate: 22.7 mmol/L (ref 20.0–28.0)
O2 Saturation: 50.2 %
Patient temperature: 37
pCO2, Ven: 36.6 mmHg — ABNORMAL LOW (ref 44.0–60.0)
pH, Ven: 7.41 (ref 7.250–7.430)
pO2, Ven: <31 mmHg — CL (ref 32.0–45.0)

## 2021-06-11 LAB — CBC WITH DIFFERENTIAL/PLATELET
Abs Immature Granulocytes: 0.07 10*3/uL (ref 0.00–0.07)
Basophils Absolute: 0 10*3/uL (ref 0.0–0.1)
Basophils Relative: 0 %
Eosinophils Absolute: 0 10*3/uL (ref 0.0–0.5)
Eosinophils Relative: 0 %
HCT: 41.9 % (ref 36.0–46.0)
Hemoglobin: 14.2 g/dL (ref 12.0–15.0)
Immature Granulocytes: 1 %
Lymphocytes Relative: 7 %
Lymphs Abs: 1 10*3/uL (ref 0.7–4.0)
MCH: 31 pg (ref 26.0–34.0)
MCHC: 33.9 g/dL (ref 30.0–36.0)
MCV: 91.5 fL (ref 80.0–100.0)
Monocytes Absolute: 0.9 10*3/uL (ref 0.1–1.0)
Monocytes Relative: 7 %
Neutro Abs: 12.2 10*3/uL — ABNORMAL HIGH (ref 1.7–7.7)
Neutrophils Relative %: 85 %
Platelets: 330 10*3/uL (ref 150–400)
RBC: 4.58 MIL/uL (ref 3.87–5.11)
RDW: 12.8 % (ref 11.5–15.5)
WBC: 14.2 10*3/uL — ABNORMAL HIGH (ref 4.0–10.5)
nRBC: 0 % (ref 0.0–0.2)

## 2021-06-11 LAB — RESP PANEL BY RT-PCR (FLU A&B, COVID) ARPGX2
Influenza A by PCR: NEGATIVE
Influenza B by PCR: NEGATIVE
SARS Coronavirus 2 by RT PCR: NEGATIVE

## 2021-06-11 LAB — CBG MONITORING, ED
Glucose-Capillary: 215 mg/dL — ABNORMAL HIGH (ref 70–99)
Glucose-Capillary: 246 mg/dL — ABNORMAL HIGH (ref 70–99)

## 2021-06-11 LAB — URINALYSIS, ROUTINE W REFLEX MICROSCOPIC
Bilirubin Urine: NEGATIVE
Glucose, UA: NEGATIVE mg/dL
Ketones, ur: NEGATIVE mg/dL
Leukocytes,Ua: NEGATIVE
Nitrite: NEGATIVE
Protein, ur: 100 mg/dL — AB
Specific Gravity, Urine: >1.03 — ABNORMAL HIGH (ref 1.005–1.030)
pH: 5.5 (ref 5.0–8.0)

## 2021-06-11 LAB — URINALYSIS, MICROSCOPIC (REFLEX): Bacteria, UA: NONE SEEN

## 2021-06-11 LAB — APTT: aPTT: 30 seconds (ref 24–36)

## 2021-06-11 LAB — PROTIME-INR
INR: 1.1 (ref 0.8–1.2)
Prothrombin Time: 13.7 seconds (ref 11.4–15.2)

## 2021-06-11 LAB — LACTIC ACID, PLASMA
Lactic Acid, Venous: 1.6 mmol/L (ref 0.5–1.9)
Lactic Acid, Venous: 2.2 mmol/L (ref 0.5–1.9)

## 2021-06-11 MED ORDER — VANCOMYCIN HCL 1750 MG/350ML IV SOLN: 1750.0000 mg | INTRAVENOUS | Status: DC

## 2021-06-11 MED ORDER — LACTATED RINGERS IV SOLN: INTRAVENOUS | Status: AC

## 2021-06-11 MED ORDER — VANCOMYCIN HCL IN DEXTROSE 1-5 GM/200ML-% IV SOLN
1000.0000 mg | Freq: Two times a day (BID) | INTRAVENOUS | Status: DC
  Administered 2021-06-12 – 2021-06-13 (×3): 1000 mg via INTRAVENOUS
  Filled 2021-06-11 (×3): qty 200

## 2021-06-11 MED ORDER — ACETAMINOPHEN 325 MG PO TABS: 650.0000 mg | ORAL_TABLET | Freq: Four times a day (QID) | ORAL | Status: DC | PRN

## 2021-06-11 MED ORDER — VANCOMYCIN HCL 1500 MG/300ML IV SOLN: 1500.0000 mg | INTRAVENOUS | Status: DC

## 2021-06-11 MED ORDER — ACETAMINOPHEN 650 MG RE SUPP
975.0000 mg | Freq: Once | RECTAL | Status: AC
  Administered 2021-06-11: 975 mg via RECTAL
  Filled 2021-06-11: qty 1

## 2021-06-11 MED ORDER — LACTATED RINGERS IV BOLUS (SEPSIS)
800.0000 mL | Freq: Once | INTRAVENOUS | Status: AC
  Administered 2021-06-11: 800 mL via INTRAVENOUS

## 2021-06-11 MED ORDER — ALBUTEROL SULFATE (2.5 MG/3ML) 0.083% IN NEBU
5.0000 mg | INHALATION_SOLUTION | Freq: Once | RESPIRATORY_TRACT | Status: AC
  Administered 2021-06-11: 5 mg via RESPIRATORY_TRACT
  Filled 2021-06-11: qty 6

## 2021-06-11 MED ORDER — VANCOMYCIN HCL 2000 MG/400ML IV SOLN
2000.0000 mg | Freq: Once | INTRAVENOUS | Status: AC
  Administered 2021-06-11: 2000 mg via INTRAVENOUS
  Filled 2021-06-11: qty 400

## 2021-06-11 MED ORDER — LACTATED RINGERS IV SOLN: INTRAVENOUS | Status: DC

## 2021-06-11 MED ORDER — SODIUM CHLORIDE 0.9 % IV SOLN
2.0000 g | Freq: Once | INTRAVENOUS | Status: AC
  Administered 2021-06-11: 2 g via INTRAVENOUS
  Filled 2021-06-11: qty 2

## 2021-06-11 MED ORDER — METRONIDAZOLE 500 MG/100ML IV SOLN
500.0000 mg | Freq: Once | INTRAVENOUS | Status: AC
  Administered 2021-06-11: 500 mg via INTRAVENOUS
  Filled 2021-06-11: qty 100

## 2021-06-11 MED ORDER — LORAZEPAM 2 MG/ML IJ SOLN
1.0000 mg | Freq: Once | INTRAMUSCULAR | Status: AC
  Administered 2021-06-11: 1 mg via INTRAVENOUS
  Filled 2021-06-11: qty 1

## 2021-06-11 MED ORDER — SODIUM CHLORIDE 0.9 % IV SOLN: 2.0000 g | Freq: Three times a day (TID) | INTRAVENOUS | Status: DC

## 2021-06-11 MED ORDER — SODIUM CHLORIDE 0.9 % IV SOLN
2.0000 g | Freq: Two times a day (BID) | INTRAVENOUS | Status: DC
  Administered 2021-06-11 – 2021-06-20 (×18): 2 g via INTRAVENOUS
  Filled 2021-06-11 (×19): qty 20

## 2021-06-11 MED ORDER — ACETAMINOPHEN 650 MG RE SUPP
650.0000 mg | Freq: Once | RECTAL | Status: AC
  Administered 2021-06-11: 650 mg via RECTAL
  Filled 2021-06-11: qty 1

## 2021-06-11 MED ORDER — ACETAMINOPHEN 500 MG PO TABS
1000.0000 mg | ORAL_TABLET | Freq: Once | ORAL | Status: DC
  Filled 2021-06-11: qty 2

## 2021-06-11 MED ORDER — HALOPERIDOL LACTATE 5 MG/ML IJ SOLN
INTRAMUSCULAR | Status: AC
  Administered 2021-06-11: 5 mg via INTRAMUSCULAR
  Filled 2021-06-11: qty 1

## 2021-06-11 MED ORDER — ACETAMINOPHEN 650 MG RE SUPP: 650.0000 mg | Freq: Four times a day (QID) | RECTAL | Status: DC | PRN

## 2021-06-11 MED ORDER — SODIUM CHLORIDE 0.9% FLUSH
3.0000 mL | Freq: Two times a day (BID) | INTRAVENOUS | Status: DC
  Administered 2021-06-12 – 2021-06-20 (×14): 3 mL via INTRAVENOUS

## 2021-06-11 MED ORDER — LORAZEPAM 2 MG/ML IJ SOLN: 1.0000 mg | INTRAMUSCULAR | Status: DC | PRN

## 2021-06-11 MED ORDER — LACTATED RINGERS IV BOLUS (SEPSIS)
1000.0000 mL | Freq: Once | INTRAVENOUS | Status: AC
  Administered 2021-06-11: 1000 mL via INTRAVENOUS

## 2021-06-11 MED ORDER — HALOPERIDOL LACTATE 5 MG/ML IJ SOLN: 5.0000 mg | Freq: Once | INTRAMUSCULAR | Status: AC

## 2021-06-11 MED ORDER — SODIUM CHLORIDE 0.9 % IV SOLN
2.0000 g | INTRAVENOUS | Status: DC
  Administered 2021-06-11 – 2021-06-13 (×8): 2 g via INTRAVENOUS
  Filled 2021-06-11 (×12): qty 2000

## 2021-06-11 MED ORDER — ACETAMINOPHEN 10 MG/ML IV SOLN
1000.0000 mg | Freq: Four times a day (QID) | INTRAVENOUS | Status: AC | PRN
  Administered 2021-06-12 (×3): 1000 mg via INTRAVENOUS
  Filled 2021-06-11 (×3): qty 100

## 2021-06-11 MED ORDER — INSULIN ASPART 100 UNIT/ML IJ SOLN
0.0000 [IU] | INTRAMUSCULAR | Status: DC
  Administered 2021-06-11: 5 [IU] via SUBCUTANEOUS
  Administered 2021-06-12: 3 [IU] via SUBCUTANEOUS
  Administered 2021-06-12: 2 [IU] via SUBCUTANEOUS
  Administered 2021-06-12: 5 [IU] via SUBCUTANEOUS
  Administered 2021-06-12: 3 [IU] via SUBCUTANEOUS
  Administered 2021-06-12 – 2021-06-13 (×3): 2 [IU] via SUBCUTANEOUS
  Administered 2021-06-13: 3 [IU] via SUBCUTANEOUS
  Administered 2021-06-13 – 2021-06-14 (×3): 2 [IU] via SUBCUTANEOUS
  Administered 2021-06-14: 3 [IU] via SUBCUTANEOUS
  Administered 2021-06-14 – 2021-06-15 (×4): 2 [IU] via SUBCUTANEOUS
  Administered 2021-06-15: 3 [IU] via SUBCUTANEOUS
  Administered 2021-06-15: 2 [IU] via SUBCUTANEOUS
  Administered 2021-06-15: 3 [IU] via SUBCUTANEOUS
  Administered 2021-06-15 – 2021-06-16 (×5): 2 [IU] via SUBCUTANEOUS

## 2021-06-11 MED ORDER — VANCOMYCIN HCL IN DEXTROSE 1-5 GM/200ML-% IV SOLN: 1000.0000 mg | Freq: Once | INTRAVENOUS | Status: DC

## 2021-06-11 NOTE — ED Triage Notes (Signed)
Brought in by ems after yesterday had flu like symptoms but was on the mend and then today family found her around noon altered and febrile.

## 2021-06-11 NOTE — ED Provider Notes (Signed)
[facility] EMERGENCY DEPARTMENT Provider Note   CSN: [id number] Arrival date & time: 06/11/21  1349     History  Chief Complaint  Patient presents with   Altered Mental Status    Lisa [name] is a 61 y.o. female.  HPI Altered mental status 61 year old female presents from home with reports that she has had flulike illness and has become obtunded today.  She has history of type 2 diabetes.  She is unable to give me details of her history.  History is obtained from nursing and EMS     Home Medications Prior to Admission medications   Medication Sig Start Date End Date Taking? Authorizing Provider  Acetylcysteine (NAC) 600 MG CAPS Take 1 capsule (600 mg) by mouth 2 times per day 04/01/21     albuterol (PROVENTIL HFA;VENTOLIN HFA) 108 (90 BASE) MCG/ACT inhaler Inhale 2 puffs into the lungs every 6 (six) hours as needed for wheezing. 09/27/12   Jearld Fenton, NP  ALPRAZolam Duanne Moron) 0.25 MG tablet Take 0.25 mg by mouth as needed.    [provider]  cholecalciferol (VITAMIN D) 1000 UNITS tablet Take 1,000 Units by mouth 2 (two) times daily.    [provider]  clonazePAM (KLONOPIN) 0.5 MG tablet TAKE ONE HALF TABLET (0.25 MG DOSE) BY MOUTH DAILY AS NEEDED FOR ANXIETY. 07/27/20 01/23/21  Bertell Maria, FNP  clonazePAM (KLONOPIN) 0.5 MG tablet Take 1/2 tablet by mouth as needed for anxiety. Max of 1 tablet daily. 05/13/21     COVID-19 mRNA Vac-TriS, Pfizer, (PFIZER-BIONT COVID-19 VAC-TRIS) SUSP injection Inject into the muscle. 11/08/20   Carlyle Basques, MD  cyclobenzaprine (FLEXERIL) 10 MG tablet Take 1 tablet (10 mg) by mouth daily at bedtime as needed 05/13/21     cyclobenzaprine (FLEXERIL) 5 MG tablet Take 1 tablet (5 mg total) by mouth daily. 05/01/21     diclofenac (VOLTAREN) 75 MG EC tablet Take 1 tablet (75 mg total) by mouth 2 (two) times daily. 03/28/21     doxycycline (VIBRA-TABS) 100 MG tablet Take 1 tablet (100 mg total) by mouth 2 (two)  times daily. 10/21/12   Midge Minium, MD  DULoxetine (CYMBALTA) 60 MG capsule Take 60 mg by mouth daily.    [provider]  Eszopiclone 3 MG TABS TAKE 1 TABLET BY MOUTH ONCE DAILY IMMEDIATELY BEFORE BEDTIME 08/02/20 01/29/21  Deveron Furlong, NP  Eszopiclone 3 MG TABS Take 1 tablet (3 mg) by mouth daily immediately before bedtime 10/31/20     Eszopiclone 3 MG TABS Take 1 tablet (3 mg) by mouth daily immediately before bedtime 01/02/21     Eszopiclone 3 MG TABS Take 1 tablet (3 mg) by mouth daily immediately before bedtime 02/27/21     famotidine (PEPCID) 20 MG tablet TAKE ONE TABLET (20 MG DOSE) BY MOUTH 2 (TWO) TIMES DAILY. 04/06/20 04/06/21  Bertell Maria, FNP  fenofibrate (TRICOR) 48 MG tablet Take one tablet (48 mg dose) by mouth daily. 05/20/21     fish oil-omega-3 fatty acids 1000 MG capsule Take 2 g by mouth daily.    [provider]  fluticasone (FLONASE) 50 MCG/ACT nasal spray PLACE TWO SPRAYS BY NASAL ROUTE DAILY. 04/25/20 04/25/21  Bertell Maria, FNP  HYDROcodone-homatropine (HYCODAN) 5-1.5 MG/5ML syrup Take 5 mLs by mouth every 8 (eight) hours as needed for cough. 09/27/12   Jearld Fenton, NP  insulin glargine (LANTUS SOLOSTAR) 100 UNIT/ML Solostar Pen INJECT 20 UNITS INTO THE SKIN AT BEDTIME. 01/15/21  insulin glargine (LANTUS) 100 UNIT/ML Solostar Pen INJECT TEN UNITS INTO THE SKIN AT BEDTIME. 04/07/20 04/07/21  Bertell Maria, FNP  Insulin Pen Needle 31G X 8 MM MISC Use to inject Victoza daily as directed 09/27/20     L-Methylfolate 15 MG TABS Take 1 tablet by mouth daily.    [provider]  levofloxacin (LEVAQUIN) 500 MG tablet Take 1 tablet (500 mg total) by mouth daily. 09/27/12   Jearld Fenton, NP  lidocaine (LIDODERM) 5 % Place 1 patch onto the skin daily. (MAY WEAR UP TO 12HOURS.) 05/01/21     liraglutide (VICTOZA) 18 MG/3ML SOPN INJECT 0.2 MLS (1.2 MG DOSE) INTO THE SKIN DAILY. 08/23/20 08/23/21  Bertell Maria, FNP  liraglutide (VICTOZA) 18  MG/3ML SOPN INJECT 0.2 MLS (1.2 MG DOSE) INTO THE SKIN DAILY. 04/06/20 04/06/21  Bertell Maria, FNP  liraglutide (VICTOZA) 18 MG/3ML SOPN Inject 0.2 mLs (1.2 mg dose) into the skin daily. 02/28/21     lisinopril (PRINIVIL,ZESTRIL) 5 MG tablet TAKE 1 TABLET (5 MG TOTAL) BY MOUTH DAILY. 11/02/13   Midge Minium, MD  lisinopril (ZESTRIL) 5 MG tablet TAKE ONE TABLET (5 MG DOSE) BY MOUTH DAILY. 08/15/20 08/28/21  Bertell Maria, FNP  meloxicam (MOBIC) 15 MG tablet Take one tablet (15 mg dose) by mouth daily. 10/15/20     meloxicam (MOBIC) 7.5 MG tablet Take 1 tablet (7.5 mg total) by mouth as needed. 12/29/11   Midge Minium, MD  metFORMIN (GLUCOPHAGE) 1000 MG tablet TAKE 1 TABLET (1,000 MG TOTAL) BY MOUTH 2 (TWO) TIMES DAILY WITH A MEAL. 09/23/13   Midge Minium, MD  metFORMIN (GLUCOPHAGE-XR) 500 MG 24 hr tablet TAKE 1 TABLET BY MOUTH WITH BREAKFAST AND 2 TABLETS WITH DINNER 04/06/20 04/06/21  Bertell Maria, FNP  metFORMIN (GLUCOPHAGE-XR) 500 MG 24 hr tablet Take 500 mg with breakfast and 1000 mg with dinner. 04/29/21     methylPREDNISolone (MEDROL DOSEPAK) 4 MG TBPK tablet Take 6 tablets by mouth on day 1, then 5 tablets on day 2, then 4 tablets on day 3, then 3 tablets on day 4, then 2 tablets on day 5, then 1 tablet on day 6 11/22/20     nicotine (NICODERM CQ - DOSED IN MG/24 HOURS) 21 mg/24hr patch Place one patch onto the skin daily. 12/14/20     OXYQUINOLONE SULFATE VAGINAL (TRIMO-SAN) 0.025 % GEL With pessary insertion 12/26/20     sertraline (ZOLOFT) 100 MG tablet TAKE TWO TABLETS BY MOUTH DAILY 07/24/20 07/24/21  Bertell Maria, FNP  sertraline (ZOLOFT) 100 MG tablet TAKE TWO TABLETS (200 MG DOSE) BY MOUTH DAILY. 04/06/20 04/06/21  Bertell Maria, FNP  sertraline (ZOLOFT) 100 MG tablet Take 2 tablets (200 mg) by mouth with dinner 01/17/21     SitaGLIPtin-MetFORMIN HCl 450 856 5142 MG TB24 Take 1 tablet by mouth daily. 10/24/13   Midge Minium, MD  spironolactone (ALDACTONE) 25 MG tablet  TAKE 1 TABLET BY MOUTH ONCE DAILY 11/02/13   Midge Minium, MD  spironolactone (ALDACTONE) 25 MG tablet TAKE ONE TABLET (25 MG DOSE) BY MOUTH DAILY. 08/15/20 08/17/21  Bertell Maria, FNP  tiZANidine (ZANAFLEX) 4 MG tablet take 1/2-1 tablet by mouth every evening for muscle spasms 11/08/20     tiZANidine (ZANAFLEX) 4 MG tablet Take 1 tablet by mouth every evening for muscle spasms and 1/2 tab as needed during daytime 02/27/21     ULTICARE SHORT PEN NEEDLES 31G X 8 MM MISC Please  use to inject Victoza once daily. ICD: E11.69, E78.2 12/27/20     vitamin B-12 (CYANOCOBALAMIN) 1000 MCG tablet Take one tablet (1,000 mcg dose) by mouth daily. 10/30/20     WELLBUTRIN XL 150 MG 24 hr tablet Take 1 tablet (150 mg) by mouth daily in the morning 10/03/20     WELLBUTRIN XL 150 MG 24 hr tablet Take 1 tablet (150 mg) by mouth daily in the morning 10/31/20     WELLBUTRIN XL 150 MG 24 hr tablet Take 1 tablet (150 mg) by mouth daily in the morning 12/11/20     WELLBUTRIN XL 300 MG 24 hr tablet Take 1 tablet (300 mg) by mouth daily in the morning 04/30/21     zolpidem (AMBIEN) 10 MG tablet TAKE ONE TABLET (10 MG DOSE) BY MOUTH AT BEDTIME AS NEEDED FOR SLEEP. 07/04/20 12/31/20    zolpidem (AMBIEN) 10 MG tablet TAKE 1 TABLET BY MOUTH AT BEDTIME AS NEEDED FOR SLEEP 06/06/20 12/03/20  Shogan, Alyson  zolpidem (AMBIEN) 10 MG tablet TAKE 1 TABLET BY MOUTH EVERY NIGHT AT BEDTIME AS NEEDED FOR SLEEP 05/07/20 11/03/20  Bertell Maria, FNP  zolpidem (AMBIEN) 10 MG tablet TAKE ONE TABLET (10 MG DOSE) BY MOUTH AT BEDTIME AS NEEDED FOR SLEEP. 04/06/20 10/03/20  Bertell Maria, FNP  zolpidem (AMBIEN) 5 MG tablet Take 5 mg by mouth at bedtime as needed.    [provider]  zolpidem (AMBIEN) 5 MG tablet TAKE ONE TABLET (5 MG DOSE) BY MOUTH AT BEDTIME AS NEEDED FOR SLEEP. 03/12/20 09/08/20  Bertell Maria, FNP      Allergies    Patient has no known allergies.    Review of Systems   Review of Systems  Unable to perform ROS: Acuity of  condition   Physical Exam Updated Vital Signs BP 140/89    Pulse (!) 115    Temp (!) 101.3 F (38.5 C) (Oral)    Resp (!) 32    Ht 1.676 m (5\' 6" )    Wt 124.7 kg    LMP 11/02/2013    SpO2 93%    BMI 44.39 kg/m  Physical Exam Vitals and nursing note reviewed.  Constitutional:      General: She is not in acute distress.    Appearance: She is obese. She is ill-appearing.  HENT:     Head: Normocephalic.     Right Ear: External ear normal.     Left Ear: External ear normal.     Nose: Nose normal.     Mouth/Throat:     Mouth: Mucous membranes are dry.  Eyes:     Pupils: Pupils are equal, round, and reactive to light.  Cardiovascular:     Rate and Rhythm: Regular rhythm. Tachycardia present.  Pulmonary:     Effort: Pulmonary effort is normal.     Breath sounds: Normal breath sounds.  Abdominal:     General: Bowel sounds are normal. There is distension.     Palpations: Abdomen is soft.  Musculoskeletal:        General: Normal range of motion.     Cervical back: Normal range of motion.  Skin:    General: Skin is warm.     Capillary Refill: Capillary refill takes less than 2 seconds.  Neurological:     General: No focal deficit present.     Mental Status: She is alert.     Comments: Patient with lateralized deficits but appears to be generally weak with equal weakness  of trunk, extremities. Patient is able to answer some my questions but appears generally slowed  Psychiatric:        Mood and Affect: Mood normal.    ED Results / Procedures / Treatments   Labs (all labs ordered are listed, but only abnormal results are displayed) Labs Reviewed  CBC WITH DIFFERENTIAL/PLATELET - Abnormal; Notable for the following components:      Result Value   WBC 14.2 (*)    Neutro Abs 12.2 (*)    All other components within normal limits  URINALYSIS, ROUTINE W REFLEX MICROSCOPIC - Abnormal; Notable for the following components:   APPearance HAZY (*)    Specific Gravity, Urine >1.030 (*)     Hgb urine dipstick MODERATE (*)    Protein, ur 100 (*)    All other components within normal limits  RESP PANEL BY RT-PCR (FLU A&B, COVID) [id number]  CULTURE, BLOOD (ROUTINE X 2)  CULTURE, BLOOD (ROUTINE X 2)  URINE CULTURE  PROTIME-INR  APTT  URINALYSIS, MICROSCOPIC (REFLEX)  COMPREHENSIVE METABOLIC PANEL  LACTIC ACID, PLASMA  LACTIC ACID, PLASMA    EKG None  Radiology DG Chest 1 View  Result Date: 06/11/2021 CLINICAL DATA:  Fever. EXAM: CHEST  1 VIEW COMPARISON:  None. FINDINGS: Mild cardiomegaly. Left lung is clear. Mild right basilar atelectasis or infiltrate is noted. The visualized skeletal structures are unremarkable. IMPRESSION: Mild right basilar atelectasis or infiltrate is noted. Electronically Signed   By: Marijo Conception M.D.   On: 06/11/2021 14:48    Procedures Procedures    Medications Ordered in ED Medications  lactated ringers infusion (has no administration in time range)  metroNIDAZOLE (FLAGYL) IVPB 500 mg (500 mg Intravenous New Bag/Given 06/11/21 1511)  vancomycin (VANCOREADY) IVPB 2000 mg/400 mL (has no administration in time range)  lactated ringers bolus 1,000 mL (1,000 mLs Intravenous New Bag/Given 06/11/21 1454)    And  lactated ringers bolus 800 mL (800 mLs Intravenous New Bag/Given 06/11/21 1455)  ceFEPIme (MAXIPIME) 2 g in sodium chloride 0.9 % 100 mL IVPB (2 g Intravenous New Bag/Given 06/11/21 1510)    ED Course/ Medical Decision Making/ A&P Clinical Course as of 06/11/21 1601  Tue Jun 11, 2021  1539 CBC reviewed and interpreted and leukocytosis noted with white blood cell count of 14,200 remainder CBC within normal limits Urinalysis is significant for moderate hemoglobin, 21-50 red blood cells, and 6-10 white blood cells with no bacteria seen this is a catheterized specimen [DR]    Clinical Course User Index [DR] Pattricia Boss, MD                           Medical Decision Making Patient acutely ill with altered mental status and fever. Sepsis  labs ordered  Amount and/or Complexity of Data Reviewed Independent Historian:     Details: Attempted to place call to Elige Ko who is listed as next of kin in demographics but no answer Reviewed last outpatient note from [place] telemedicine visit noted Labs: ordered. Decision-making details documented in ED Course. Radiology: ordered. Decision-making details documented in ED Course.    Details: Chest x-Audriella Blakeley reviewed and possible right basilar infiltrate is noted   Patient presents with symptoms consistent with sepsis Patient given broad-spectrum antibiotics and IV fluid bolus ordered Discussed care with Dr. Maryan Rued and she has assumed care Final Clinical Impression(s) / ED Diagnoses Final diagnoses:  Sepsis ([place])  AMS (altered mental status)    Rx /  DC Orders ED Discharge Orders     None         Pattricia Boss, MD 06/11/21 [phone]

## 2021-06-11 NOTE — Hospital Course (Addendum)
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 However, mastoiditis cannot be completely excluded  MRI w/wo Head (03/2008) No acute intracranial pathology. Bilateral mastoid  opacification.  CSF LP (11/2008): 0 cells, cultures negative

## 2021-06-11 NOTE — ED Notes (Signed)
Called [facility] radiology to schedule fluoro LP per radiology recommendations. [facility] radiology states call back at 7am to schedule the LP for tomororw, there is no radiologist to do procedure in the hospital to do procedure. Call [phone] at 7am

## 2021-06-11 NOTE — H&P (Addendum)
Date: 06/11/2021               Patient Name:  Lisa [name] MRN: [id number]  DOB: 1961-04-26 Age / Sex: 61 y.o., female   PCP: Linward Headland, MD         Medical Service: Internal Medicine Teaching Service         Attending Physician: Dr. Lalla Brothers     First Contact: Idamae Schuller, MD Pager: [phone]  Second Contact: Sanjuan Dame. MD Pager: [phone]       After Hours (After 5p/  First Contact Pager: [phone]  weekends / holidays): Second Contact Pager: [phone]    Chief Complaint: altered mental status   History of Present Illness:  Lisa [name] is a 61 y.o. 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 No hx of meningitis.   All of the history was provided by her spouse, Lisa [name]. According to her husband, the patient had a sudden onset headache, fever, and CSF leak from nose the morning prior to arrival to the ED. Pt attributed headache to sudden cessation of caffeine and noticed possible improvement with caffeine intake. Husband reports that she has not complained of chills, neck stiffness, vision changes, N/V, CP, SHOB, abdominal pain, urinary sxs, diarrhea, constipation or myalgia. No skin changes. He reports that she had 1 day of chills, sore throat, and body aches a week ago that resolved on its own.  No recent changes besides starting a diet this month and recent reduction in diabetes medication.    ED course:  Negative COVID and flu CBG 215 UA without convincing findings of UTI CMP with normal renal function and electrolytes. Anion gap of 11.   Leukocytosis of 14 Lactic  acid 1.6 -> 2.2  VBG without hypercarbia  Chest x-ray with mild right basilar atelectasis vs infiltrate  CT head negative Received 2L IVF, Cefepime, Vancomycin, and Metronidazole  Blood culture x2 drawn Unsuccessful LP x3 attempts by ED physician after receiving ativan; limited by body habitus and agitation -> fluoro guided LP pending    Meds:  Diclofenac 75 mg BID Metformin XR 500 mg TID Aspirin 81 mg daily Wellbutrin 300 mg QD Sertraline 200 mg daily Spironolactone 25 mg daily Victoza 1.2 mg daily Insulin 20 units at bedtime Klonopin 0.5 mg PRN  Tizanidine 4 mg PRN Flexeril 10 mg PRN  Allergies: Allergies as of 06/11/2021 - Review Complete 06/11/2021  Allergen Reaction Noted   Statins Other (See Comments) 03/24/2016   Glimepiride Other (See Comments) 06/11/2021   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    Physical Exam: Blood pressure (!) 151/73, pulse (!) 114, temperature (!) 100.4 F (38 C), temperature source Oral, resp. rate (!) 24, height 5\' 6"  (1.676 m), weight 124.7 kg, last menstrual period 11/02/2013, SpO2 94 %.  Constitutional: ill-appearing, not alert, agitated  HENT: normocephalic, atraumatic, mucous membranes moist Eyes: conjunctiva  non-erythematous, EOMI Cardiovascular: regular rhythm, tachycardic, no m/r/g, non-edematous bilateral LE Pulmonary/Chest: normal work of breathing on 4L supplemental O2, LCTAB Abdominal: soft, non-tender to palpation, non-distended MSK: normal bulk and tone Neurological: disoriented to person place and time  Skin: very warm and dry, no rashes or acute skin changes  Psych: normal behavior, normal affect   EKG: Sinus tachycardia   CXR: Mild right basilar atelectasis versus infiltrate   Assessment & Plan by Problem:  Lisa [name] is a 61 y.o. female with a pertinent PMH of HTN, HLD, T2DM, and CSF leaks admitted for acute toxic metabolic encephalopathy in setting of sepsis.   Probable Acute Bacterial Meningitis At risk for sepsis AMS shortly after acute onset headache and CSF leak. No recent illness or sick contacts. IVF, cefepime, vancomycin, and metronidazole given in ED. Lactic acid 1.6 -> 2.2. Labs otherwise unremarkable and UA normal.  Presentation consistent with meningitis and encephalitis given hx of chronic CSF leaks and rapidly progressive sxs after onset of headache, fever and CSF leak. Decadron was not started at time of abx in ED; no benefit in starting several hour later now. Three attempts at LP in the ED were unsuccessful.  -Continue Vancomycin -Start Ceftriaxone  -Start Ampicillin  -IV Tylenol 2/2 agitation  -LR @ 75 cc/hr for 12 hours  -Follow up on blood cultures x2 -Fluoroscopy guided LP in AM    Acute Metabolic Encephalopathy  Acute toxic metabolic encephalology in setting of sepsis and meningitis/encephalitis. Persisting agitation despite receiving Haldol and Ativan in ED.  -Haldol prn  -Soft restraints temporarily for safety and to maintain IV access -NPO   T2DM Most recent A1c 8.4 (10/2013). Takes metformin 500 mg TID at home.  -SSI  -Q4 hours CBG monitoring   HTN Hx of HTN. BP 151/73. Does not take any anti-hypertensives at home.   HLD Most recent  LDL 78 (2015), not on statin therapy     Best Practice: Diet: NPO IVF: LR,75cc/hr VTE: SCDs Code: Full   Lajean Manes, MD  Internal Medicine Resident, PGY-1 [facility]  Pager: [phone] 8:31 PM, 06/11/2021

## 2021-06-11 NOTE — ED Provider Notes (Addendum)
Assumed care of Lisa [name] from Dr. Jeanell Sparrow.  Lisa [name] presenting with sx of code sepsis.  Altered, febrile without known source.  Pts labs were independently reviewed by myself patient has negative COVID and flu, lactic acid is normal at 1.6, UA without convincing findings of UTI, CMP with normal renal function and electrolytes.  Anion gap of 11.  Leukocytosis of 14,000 today.  Chest x-ray does show mild right basilar atelectasis versus infiltrate.  As I went back into the room to evaluate the patient she is altered, tachypneic, febrile.  Her husband is now present at bedside and reports that she is very confused which is not normal for her.  She started having headache yesterday and he thought it was related to her trying to stop taking caffeine.  She does have a history of prior CSF leak and low pressure headaches and they thought maybe that was the cause.  He had been giving her caffeine and she seemed to be better last night after having caffeine.  However when she woke up this morning she was not herself.  As far as her husband knows this is the first she has had a fever.  Patient is agitated, altered.  Concern for possible hypercarbia.  Blood sugar is still in the 200s.  Also concern for acute intracranial pathology.  CT of the head is pending but she did require Haldol due to her agitation.  Fever did improve with Tylenol that was given rectally.  Patient has already been covered broadly with antibiotics.  sHe will need admission feel that she may need ICU based on symptoms.  I spoke with critical care to discuss the patient.  At this time based on her labs they did not feel that she needed critical care and she would be appropriate for stepdown.  Patient's VBG without evidence of hypercarbia.  Head CT which I independently evaluated is negative for acute findings except for sinusitis.  Given patient's size, agitation and she will need to be lying on her side feel that she will need a fluoroscopic guided LP.  She will need  Ativan to receive this test.  Discussed the findings with the hospitalist who agrees that patient requires admission.  On repeat evaluation patient was resting more comfortably after Haldol but still easily arousable.  Patient's husband is also at bedside and discussed the findings with him and plan.  .Lumbar Puncture  Date/Time: 06/11/2021 9:28 PM Performed by: Blanchie Dessert, MD Authorized by: Blanchie Dessert, MD   Consent:    Consent obtained:  Verbal   Consent given by:  Spouse   Risks, benefits, and alternatives were discussed: yes     Risks discussed:  Bleeding, pain and headache Universal protocol:    Test results available: yes     Imaging studies available: yes     Immediately prior to procedure a time out was called: yes     Patient identity confirmed:  Arm band Pre-procedure details:    Procedure purpose:  Diagnostic   Preparation: Patient was prepped and draped in usual sterile fashion   Anesthesia:    Anesthesia method:  Local infiltration   Local anesthetic:  Lidocaine 1% WITH epi Procedure details:    Lumbar space:  L4-L5 interspace   Patient position:  R lateral decubitus   Needle gauge:  20   Needle type:  Diamond point   Ultrasound guidance: no     Number of attempts:  3 Post-procedure details:    Puncture site:  Adhesive bandage applied  Procedure completion:  Tolerated Comments:     Unable to obtain CSF.  Lisa [name] also agitated and uncooperative   CRITICAL CARE Performed by: Jyll Tomaro Total critical care time: 30 minutes Critical care time was exclusive of separately billable procedures and treating other patients. Critical care was necessary to treat or prevent imminent or life-threatening deterioration. Critical care was time spent personally by me on the following activities: development of treatment plan with patient and/or surrogate as well as nursing, discussions with consultants, evaluation of patient's response to treatment, examination of  patient, obtaining history from patient or surrogate, ordering and performing treatments and interventions, ordering and review of laboratory studies, ordering and review of radiographic studies, pulse oximetry and re-evaluation of patient's condition.     Blanchie Dessert, MD 06/11/21 4834    Blanchie Dessert, MD 06/11/21 2130

## 2021-06-11 NOTE — Progress Notes (Addendum)
Pharmacy Antibiotic Note  Lisa [name] is a 61 y.o. female admitted on 06/11/2021 with sepsis. Patient reported that she had flu-like illness. Pharmacy has been consulted for cefepime and vancomycin dosing.  WBC elevated at 14.2, febrile with Tmax 100.4. Scr 0.96 (CrCl 83.1 mL/min).   Plan: Cefepime IV 2g x 1 Vancomycin IV 2g x 1 Cefepime IV 2g q 8 hours Vancomycin 1500 mg q 24 hours (estimated AUC 460, goal 400-550) Follow-up cultures  Monitor renal function and clinical improvement  Height: 5\' 6"  (167.6 cm) Weight: 124.7 kg (275 lb) IBW/kg (Calculated) : 59.3  No data recorded.  No results for input(s): WBC, CREATININE, LATICACIDVEN, VANCOTROUGH, VANCOPEAK, VANCORANDOM, GENTTROUGH, GENTPEAK, GENTRANDOM, TOBRATROUGH, TOBRAPEAK, TOBRARND, AMIKACINPEAK, AMIKACINTROU, AMIKACIN in the last 168 hours.  CrCl cannot be calculated (Patient's most recent lab result is older than the maximum 21 days allowed.).    No Known Allergies  Antimicrobials this admission: 06/11/21 cefepime >>  06/11/21 vancomycin >> 06/11/21 metronidazole >>  Dose adjustments this admission: none  Microbiology results: 06/11/21 Bcx x 2: pending 06/11/21 UCx: pending   Thank you for involving pharmacy in this patient's care.  Elita Quick, PharmD PGY1 Ambulatory Care Pharmacy Resident 06/11/2021 3:09 PM  **Pharmacist phone directory can be found on [email or web address] listed under [place]**    ADDENDUM: Patient with suspected meningitis. Pharmacy consulted to dose vancomycin. Will need to adjust vancomycin dose and utilize vancomycin nomogram with trough goal 15-20 mcg/mL.   Plan: Vancomycin 1000 mg q 12h, trough goal 15-20 mcg/mL Obtain vancomycin trough after 4-5 doses F/u blood culture and CSF cultures Monitor renal function and signs of clinical improvement  Thank you for involving pharmacy in this patient's care.  Elita Quick, PharmD PGY1 Ambulatory Care Pharmacy Resident 06/11/2021 9:36  PM  **Pharmacist phone directory can be found on [email or web address] listed under [place]**

## 2021-06-11 NOTE — Progress Notes (Signed)
Pt is a difficult stick. Labs are delayed. Phlebotomy called.

## 2021-06-11 NOTE — Progress Notes (Signed)
Elink following sepsis bundle. °

## 2021-06-12 ENCOUNTER — Inpatient Hospital Stay (HOSPITAL_COMMUNITY): Payer: Commercial Managed Care - HMO

## 2021-06-12 ENCOUNTER — Inpatient Hospital Stay (HOSPITAL_COMMUNITY): Payer: Commercial Managed Care - HMO | Admitting: Anesthesiology

## 2021-06-12 ENCOUNTER — Encounter (HOSPITAL_COMMUNITY)
Admission: EM | Disposition: A | Discharge status: Rehabilitation Facility | Payer: Self-pay | Source: Home / Self Care | Attending: Pulmonary Disease

## 2021-06-12 DIAGNOSIS — G9341 Metabolic encephalopathy: Secondary | ICD-10-CM | POA: Diagnosis present

## 2021-06-12 DIAGNOSIS — R209 Unspecified disturbances of skin sensation: Secondary | ICD-10-CM

## 2021-06-12 DIAGNOSIS — R4182 Altered mental status, unspecified: Secondary | ICD-10-CM | POA: Diagnosis present

## 2021-06-12 DIAGNOSIS — G009 Bacterial meningitis, unspecified: Secondary | ICD-10-CM | POA: Diagnosis not present

## 2021-06-12 DIAGNOSIS — G9600 Cerebrospinal fluid leak, unspecified: Secondary | ICD-10-CM | POA: Diagnosis not present

## 2021-06-12 HISTORY — PX: IR FLUORO GUIDED NEEDLE PLC ASPIRATION/INJECTION LOC: IMG2395

## 2021-06-12 HISTORY — PX: RADIOLOGY WITH ANESTHESIA: SHX6223

## 2021-06-12 LAB — CBC WITH DIFFERENTIAL/PLATELET
Abs Immature Granulocytes: 0.07 10*3/uL (ref 0.00–0.07)
Basophils Absolute: 0 10*3/uL (ref 0.0–0.1)
Basophils Relative: 0 %
Eosinophils Absolute: 0 10*3/uL (ref 0.0–0.5)
Eosinophils Relative: 0 %
HCT: 39.5 % (ref 36.0–46.0)
Hemoglobin: 13.2 g/dL (ref 12.0–15.0)
Immature Granulocytes: 1 %
Lymphocytes Relative: 8 %
Lymphs Abs: 1.1 10*3/uL (ref 0.7–4.0)
MCH: 30.6 pg (ref 26.0–34.0)
MCHC: 33.4 g/dL (ref 30.0–36.0)
MCV: 91.6 fL (ref 80.0–100.0)
Monocytes Absolute: 1.4 10*3/uL — ABNORMAL HIGH (ref 0.1–1.0)
Monocytes Relative: 10 %
Neutro Abs: 11.7 10*3/uL — ABNORMAL HIGH (ref 1.7–7.7)
Neutrophils Relative %: 81 %
Platelets: 312 10*3/uL (ref 150–400)
RBC: 4.31 MIL/uL (ref 3.87–5.11)
RDW: 12.9 % (ref 11.5–15.5)
WBC: 14.3 10*3/uL — ABNORMAL HIGH (ref 4.0–10.5)
nRBC: 0 % (ref 0.0–0.2)

## 2021-06-12 LAB — HIV ANTIBODY (ROUTINE TESTING W REFLEX): HIV Screen 4th Generation wRfx: NONREACTIVE

## 2021-06-12 LAB — CSF CELL COUNT WITH DIFFERENTIAL
Eosinophils, CSF: 0 % (ref 0–1)
Lymphs, CSF: 11 % — ABNORMAL LOW (ref 40–80)
Monocyte-Macrophage-Spinal Fluid: 5 % — ABNORMAL LOW (ref 15–45)
RBC Count, CSF: 1825 /mm3 — ABNORMAL HIGH
Segmented Neutrophils-CSF: 84 % — ABNORMAL HIGH (ref 0–6)
Tube #: 1
WBC, CSF: 6425 /mm3 (ref 0–5)

## 2021-06-12 LAB — POCT I-STAT 7, (LYTES, BLD GAS, ICA,H+H)
Acid-base deficit: 3 mmol/L — ABNORMAL HIGH (ref 0.0–2.0)
Bicarbonate: 24.8 mmol/L (ref 20.0–28.0)
Calcium, Ion: 1.24 mmol/L (ref 1.15–1.40)
HCT: 35 % — ABNORMAL LOW (ref 36.0–46.0)
Hemoglobin: 11.9 g/dL — ABNORMAL LOW (ref 12.0–15.0)
O2 Saturation: 100 %
Patient temperature: 102.9
Potassium: 3.3 mmol/L — ABNORMAL LOW (ref 3.5–5.1)
Sodium: 142 mmol/L (ref 135–145)
TCO2: 26 mmol/L (ref 22–32)
pCO2 arterial: 58.5 mmHg — ABNORMAL HIGH (ref 32.0–48.0)
pH, Arterial: 7.247 — ABNORMAL LOW (ref 7.350–7.450)
pO2, Arterial: 208 mmHg — ABNORMAL HIGH (ref 83.0–108.0)

## 2021-06-12 LAB — GLUCOSE, CAPILLARY
Glucose-Capillary: 127 mg/dL — ABNORMAL HIGH (ref 70–99)
Glucose-Capillary: 151 mg/dL — ABNORMAL HIGH (ref 70–99)

## 2021-06-12 LAB — DIC (DISSEMINATED INTRAVASCULAR COAGULATION)PANEL
D-Dimer, Quant: 1.61 ug/mL-FEU — ABNORMAL HIGH (ref 0.00–0.50)
Fibrinogen: 658 mg/dL — ABNORMAL HIGH (ref 210–475)
INR: 1.3 — ABNORMAL HIGH (ref 0.8–1.2)
Platelets: 299 10*3/uL (ref 150–400)
Prothrombin Time: 15.7 seconds — ABNORMAL HIGH (ref 11.4–15.2)
Smear Review: NONE SEEN
aPTT: 27 seconds (ref 24–36)

## 2021-06-12 LAB — COMPREHENSIVE METABOLIC PANEL
ALT: 15 U/L (ref 0–44)
AST: 19 U/L (ref 15–41)
Albumin: 3.1 g/dL — ABNORMAL LOW (ref 3.5–5.0)
Alkaline Phosphatase: 46 U/L (ref 38–126)
Anion gap: 9 (ref 5–15)
BUN: 13 mg/dL (ref 8–23)
CO2: 21 mmol/L — ABNORMAL LOW (ref 22–32)
Calcium: 8.4 mg/dL — ABNORMAL LOW (ref 8.9–10.3)
Chloride: 104 mmol/L (ref 98–111)
Creatinine, Ser: 0.89 mg/dL (ref 0.44–1.00)
GFR, Estimated: >60 mL/min (ref >60)
Glucose, Bld: 205 mg/dL — ABNORMAL HIGH (ref 70–99)
Potassium: 2.9 mmol/L — ABNORMAL LOW (ref 3.5–5.1)
Sodium: 134 mmol/L — ABNORMAL LOW (ref 135–145)
Total Bilirubin: 0.2 mg/dL — ABNORMAL LOW (ref 0.3–1.2)
Total Protein: 6.4 g/dL — ABNORMAL LOW (ref 6.5–8.1)

## 2021-06-12 LAB — CBG MONITORING, ED
Glucose-Capillary: 209 mg/dL — ABNORMAL HIGH (ref 70–99)
Glucose-Capillary: 150 mg/dL — ABNORMAL HIGH (ref 70–99)
Glucose-Capillary: 157 mg/dL — ABNORMAL HIGH (ref 70–99)

## 2021-06-12 LAB — URINE CULTURE: Culture: NO GROWTH

## 2021-06-12 LAB — PROTEIN AND GLUCOSE, CSF
Glucose, CSF: 32 mg/dL — ABNORMAL LOW (ref 40–70)
Total  Protein, CSF: 283 mg/dL — ABNORMAL HIGH (ref 15–45)

## 2021-06-12 LAB — STREP PNEUMONIAE URINARY ANTIGEN: Strep Pneumo Urinary Antigen: NEGATIVE

## 2021-06-12 LAB — MRSA NEXT GEN BY PCR, NASAL: MRSA by PCR Next Gen: NOT DETECTED

## 2021-06-12 LAB — LACTIC ACID, PLASMA: Lactic Acid, Venous: 1.3 mmol/L (ref 0.5–1.9)

## 2021-06-12 SURGERY — IR WITH ANESTHESIA
Anesthesia: General

## 2021-06-12 MED ORDER — ACETAMINOPHEN 650 MG RE SUPP
650.0000 mg | Freq: Once | RECTAL | Status: AC
  Administered 2021-06-12: 650 mg via RECTAL
  Filled 2021-06-12: qty 1

## 2021-06-12 MED ORDER — FENTANYL CITRATE (PF) 250 MCG/5ML IJ SOLN
INTRAMUSCULAR | Status: DC | PRN
  Administered 2021-06-12 (×2): 50 ug via INTRAVENOUS

## 2021-06-12 MED ORDER — PROPOFOL 500 MG/50ML IV EMUL
INTRAVENOUS | Status: DC | PRN
Start: 2021-06-12 — End: 2021-06-12
  Administered 2021-06-12: 50 ug/kg/min via INTRAVENOUS

## 2021-06-12 MED ORDER — HALOPERIDOL LACTATE 5 MG/ML IJ SOLN
5.0000 mg | Freq: Once | INTRAMUSCULAR | Status: AC
  Administered 2021-06-12: 5 mg via INTRAVENOUS

## 2021-06-12 MED ORDER — LIDOCAINE 2% (20 MG/ML) 5 ML SYRINGE
INTRAMUSCULAR | Status: DC | PRN
Start: 2021-06-12 — End: 2021-06-12
  Administered 2021-06-12: 80 mg via INTRAVENOUS

## 2021-06-12 MED ORDER — DOCUSATE SODIUM 50 MG/5ML PO LIQD
100.0000 mg | Freq: Two times a day (BID) | ORAL | Status: DC
  Administered 2021-06-13 – 2021-06-14 (×3): 100 mg
  Filled 2021-06-12 (×3): qty 10

## 2021-06-12 MED ORDER — PROPOFOL 10 MG/ML IV BOLUS
INTRAVENOUS | Status: DC | PRN
  Administered 2021-06-12: 100 mg via INTRAVENOUS
  Administered 2021-06-12: 70 mg via INTRAVENOUS

## 2021-06-12 MED ORDER — SUGAMMADEX SODIUM 200 MG/2ML IV SOLN
INTRAVENOUS | Status: DC | PRN
Start: 2021-06-12 — End: 2021-06-12
  Administered 2021-06-12: 400 mg via INTRAVENOUS

## 2021-06-12 MED ORDER — LACTATED RINGERS IV SOLN: INTRAVENOUS | Status: DC | PRN

## 2021-06-12 MED ORDER — PANTOPRAZOLE SODIUM 40 MG IV SOLR
40.0000 mg | Freq: Every day | INTRAVENOUS | Status: DC
  Administered 2021-06-12 – 2021-06-13 (×2): 40 mg via INTRAVENOUS
  Filled 2021-06-12 (×2): qty 40

## 2021-06-12 MED ORDER — CHLORHEXIDINE GLUCONATE CLOTH 2 % EX PADS
6.0000 | MEDICATED_PAD | Freq: Every day | CUTANEOUS | Status: DC
  Administered 2021-06-12 – 2021-06-17 (×6): 6 via TOPICAL

## 2021-06-12 MED ORDER — ONDANSETRON HCL 4 MG/2ML IJ SOLN
INTRAMUSCULAR | Status: DC | PRN
  Administered 2021-06-12: 4 mg via INTRAVENOUS

## 2021-06-12 MED ORDER — ORAL CARE MOUTH RINSE
15.0000 mL | OROMUCOSAL | Status: DC
  Administered 2021-06-12 – 2021-06-14 (×17): 15 mL via OROMUCOSAL

## 2021-06-12 MED ORDER — LACTATED RINGERS IV SOLN: INTRAVENOUS | Status: DC

## 2021-06-12 MED ORDER — ALBUMIN HUMAN 5 % IV SOLN: INTRAVENOUS | Status: DC | PRN

## 2021-06-12 MED ORDER — STERILE WATER FOR INJECTION IJ SOLN
INTRAMUSCULAR | Status: AC
  Administered 2021-06-12: 2.1 mL
  Filled 2021-06-12: qty 10

## 2021-06-12 MED ORDER — CHLORHEXIDINE GLUCONATE 0.12% ORAL RINSE (MEDLINE KIT): 15.0000 mL | Freq: Two times a day (BID) | OROMUCOSAL | Status: DC

## 2021-06-12 MED ORDER — POTASSIUM CHLORIDE 10 MEQ/100ML IV SOLN
INTRAVENOUS | Status: AC
  Filled 2021-06-12: qty 100

## 2021-06-12 MED ORDER — POLYETHYLENE GLYCOL 3350 17 G PO PACK
17.0000 g | PACK | Freq: Every day | ORAL | Status: DC
  Administered 2021-06-12 – 2021-06-14 (×3): 17 g
  Filled 2021-06-12 (×3): qty 1

## 2021-06-12 MED ORDER — HALOPERIDOL LACTATE 5 MG/ML IJ SOLN
5.0000 mg | Freq: Four times a day (QID) | INTRAMUSCULAR | Status: DC | PRN
  Administered 2021-06-12 (×2): 5 mg via INTRAMUSCULAR
  Filled 2021-06-12 (×3): qty 1

## 2021-06-12 MED ORDER — FENTANYL CITRATE PF 50 MCG/ML IJ SOSY
50.0000 ug | PREFILLED_SYRINGE | INTRAMUSCULAR | Status: DC | PRN
  Administered 2021-06-12: 100 ug via INTRAVENOUS
  Filled 2021-06-12: qty 4

## 2021-06-12 MED ORDER — OLANZAPINE 10 MG IM SOLR
2.5000 mg | Freq: Once | INTRAMUSCULAR | Status: AC
  Administered 2021-06-12: 2.5 mg via INTRAMUSCULAR
  Filled 2021-06-12: qty 10

## 2021-06-12 MED ORDER — FENTANYL CITRATE PF 50 MCG/ML IJ SOSY: 50.0000 ug | PREFILLED_SYRINGE | INTRAMUSCULAR | Status: DC | PRN

## 2021-06-12 MED ORDER — ROCURONIUM BROMIDE 10 MG/ML (PF) SYRINGE
PREFILLED_SYRINGE | INTRAVENOUS | Status: DC | PRN
  Administered 2021-06-12: 100 mg via INTRAVENOUS
  Administered 2021-06-12: 50 mg via INTRAVENOUS
  Administered 2021-06-12: 100 mg via INTRAVENOUS

## 2021-06-12 MED ORDER — ORAL CARE MOUTH RINSE: 15.0000 mL | OROMUCOSAL | Status: DC

## 2021-06-12 MED ORDER — ENOXAPARIN SODIUM 40 MG/0.4ML IJ SOSY
40.0000 mg | PREFILLED_SYRINGE | INTRAMUSCULAR | Status: DC
  Administered 2021-06-12: 40 mg via SUBCUTANEOUS
  Filled 2021-06-12: qty 0.4

## 2021-06-12 MED ORDER — CHLORHEXIDINE GLUCONATE 0.12% ORAL RINSE (MEDLINE KIT)
15.0000 mL | Freq: Two times a day (BID) | OROMUCOSAL | Status: DC
  Administered 2021-06-12 – 2021-06-14 (×4): 15 mL via OROMUCOSAL

## 2021-06-12 MED ORDER — FENTANYL CITRATE (PF) 100 MCG/2ML IJ SOLN
INTRAMUSCULAR | Status: AC
  Filled 2021-06-12: qty 2

## 2021-06-12 MED ORDER — MIDAZOLAM HCL 2 MG/2ML IJ SOLN
INTRAMUSCULAR | Status: AC
  Filled 2021-06-12: qty 2

## 2021-06-12 MED ORDER — SUCCINYLCHOLINE CHLORIDE 200 MG/10ML IV SOSY
PREFILLED_SYRINGE | INTRAVENOUS | Status: DC | PRN
Start: 2021-06-12 — End: 2021-06-12
  Administered 2021-06-12: 200 mg via INTRAVENOUS

## 2021-06-12 MED ORDER — SODIUM CHLORIDE 0.9 % IV SOLN
400.0000 mg | Freq: Once | INTRAVENOUS | Status: AC
  Administered 2021-06-12: 400 mg via INTRAVENOUS
  Filled 2021-06-12: qty 4

## 2021-06-12 MED ORDER — PROPOFOL 1000 MG/100ML IV EMUL
0.0000 ug/kg/min | INTRAVENOUS | Status: DC
  Administered 2021-06-12: 40 ug/kg/min via INTRAVENOUS
  Administered 2021-06-12 (×2): 50 ug/kg/min via INTRAVENOUS
  Administered 2021-06-13: 35 ug/kg/min via INTRAVENOUS
  Administered 2021-06-13: 20 ug/kg/min via INTRAVENOUS
  Filled 2021-06-12 (×5): qty 100

## 2021-06-12 MED ORDER — MIDAZOLAM HCL 2 MG/2ML IJ SOLN
INTRAMUSCULAR | Status: DC | PRN
  Administered 2021-06-12 (×2): 1 mg via INTRAVENOUS

## 2021-06-12 MED ORDER — POTASSIUM CHLORIDE 10 MEQ/100ML IV SOLN
10.0000 meq | INTRAVENOUS | Status: AC
  Administered 2021-06-12 (×4): 10 meq via INTRAVENOUS
  Filled 2021-06-12 (×2): qty 100

## 2021-06-12 NOTE — Progress Notes (Signed)
Interval Progress Note:   Patient re-evaluated at bedside after discussion with patient's RN Larene Beach. Nurse noted persistent tachypnea with increasing oxygen requirement. Rectal temperature elevated at 103.4 at approximately 2:20 AM despite rectal Tylenol given at 21:00.   On examination, patient is disoriented and only intermittently following commands. She agitated requiring soft wrist restraints. She is tachypneic with rates in the mid 40s - low 50s, oxygen saturation 91% on 6L [place]. On skin exam, she is diaphoretic with diffuse erythema; extremities are hot to the touch.   Assessment/Plan:   # Sepsis  Source of sepsis suspected to be meningitis in the setting of known chronic CSF leak presenting with fever, headache and AMS.   Due to persistent tachypnea and increasing oxygen requirements concerning for impending respiratory fatigue, PCCM was consulted. Their recommendations are to reduce fever and re-evaluate.   Despite rectal Tylenol, patient has remained febrile above 103F. IV Tylenol administered with some response after +1 hour; temperature only improved to 102F. Cooling blanket applied and will administer IV Ibuprofen to further reduce temperature.    Dr. Jose Persia Internal Medicine PGY-3  Pager: [phone] After 5pm on weekdays and 1pm on weekends: On Call pager [phone]  06/12/2021, 4:52 AM

## 2021-06-12 NOTE — Consult Note (Signed)
NAME:  Lisa [name], MRN:  [id number], DOB:  1961-03-31, LOS: 1 ADMISSION DATE:  06/11/2021, CONSULTATION DATE:  06/12/20 REFERRING MD:  Collene Gobble - IMTS, CHIEF COMPLAINT:  Altered mental status // Reason for consult: ventilator management   History of Present Illness:  61 yo F PMH temporal bone abnormality with associated intermitted CSF leak presented to [facility] 1/3 with AMS and fever. Intermittent CSF leak x 15 years, associated HA for about 2 days after each CSF leak episode. LP was attempted x3 in ED without success. Admitted to IMTS for suspected bacterial meningitis. She was started on vanc, rocephin, ampicillin. he also exhibited agitated delirium and was given Haldol and zyprexa.  On 1/4 the patient went to IR for LP and required intubation for the procedure. She remains intubated after the procedure. LP yielded 50ml cloudy CSF fluid which was sent for analysis   PCCM is consulted in this setting   Admitting labs reveal leukocytosis WBC 14.2 remaining CBC WNL. Metabolic abnormalities include Na 133 Glu 210 Ca 8.7  Pertinent  Medical History  Temporal bone abnormality CSF leaks HTN HLD DM2 Chronic pain  Rectocele  Depression  Sleep apnea Hepatic steatosis   Significant Hospital Events: Including procedures, antibiotic start and stop dates in addition to other pertinent events   1/3 admitted to IMTS for suspected bacterial meningitis. LP attempt x3 in ED without success. Haldol, zyprexa for agitation 1/4 LP with IR, required intubation for procedure. Remains intubated, CCM consulted   Interim History / Subjective:  S/p LP Remains intubated   Tmax 104   Objective   Blood pressure 133/64, pulse (!) 119, temperature (!) 104 F (40 C), temperature source Rectal, resp. rate (!) 37, height 5\' 6"  (1.676 m), weight 124.7 kg, last menstrual period 11/02/2013, SpO2 99 %.        Intake/Output Summary (Last 24 hours) at 06/12/2021 1626 Last data filed at 06/12/2021 1555 Gross per 24  hour  Intake 1500 ml  Output --  Net 1500 ml   Filed Weights   06/11/21 1422  Weight: 124.7 kg    Examination: General: Chronically and critically ill appearing middle aged F intubated sedated  HENT:  Lungs: Mechanically ventilated. Symmetrical chest expansion. Diminished basilar sounds  Cardiovascular: tachycardic rate reg rhythm. Cap refill sluggish  Abdomen: Obese soft round ndnt  Extremities: No acute joint deformity. Mottled Neuro: PERRLA 31mm. Sedated. Not following commands GU: no foley Skin: mottled appearing distal extremities. Cool to touch. [facility] Problem list     Assessment & Plan:  Severe sepsis due to suspected bacterial meningitis  -With an acute concern for bacterial meningitis with history of CSF leak  -On arrival patient was seen febrile with temp 101.3, tachypnic with RR 30, tachycardic with HR 115-120 with associated leukocytosis (WBC 14.3), Developing elevated lactic acid of 2.2 (1.3 on admit) and acute AMS (developed while in ED) P: Admit ICU Continue IV Ampicillin, Ceftriaxone, and Vancomycin  Add LR 75/hr Follow Cx Data  Trend lactic acid Monitor urine output PRN antipyretics, cooling blanket   Concern for bacterial meningitis in the setting of known CSF leak  Acute metabolic encephalopathy  -Per family patient developed worsened headache the night of admission with CSF leak  P: LP completed per IR 1/4 Consult neurology Continue antibiotics as above   Follow CSF  Expected ventilator management post procedure  -Patient was intubated for LP given severe combativeness and confusion -She remained intubated post procedure  P: CXR and ABG in  ICU Await neurology consult prior to extubation to ensure diagnostic workup including images have been completed  SAT/SBT as tolerated, mentation preclude extubation  VAP bundle in place  PAD protocol  Hyponatremia  Hypokalemia  -s/p 6 runs of K 1/4 P: Trend Bmet  Supplement as needed    Hx of depression  -Home medications includes Ambien, Wellbutrin, Zoloft,  P: Hold home medications during acute illness   Hx of type 2 diabetes -Home medications include Victoza, metformin, and lantus  P: SSI  CBG checsk q4 CBG goal 140-180 Monitor CBG on SSI and add long acting insulin if needed   Hx of HTN/HLD -Home medications include ASA, Lisinopril,  P: Continuous telemetry  Strict intake and output  Daily weight to assess volume status Closely monitor renal function and electrolytes    Best Practice (right click and "Reselect all SmartList Selections" daily)   Diet/type: NPO DVT prophylaxis: LMWH GI prophylaxis: PPI Lines: N/A Foley:  N/A Code Status:  full code Last date of multidisciplinary goals of care discussion: Pending   Labs   CBC: Recent Labs  Lab 06/11/21 1423 06/12/21 0040  WBC 14.2* 14.3*  NEUTROABS 12.2* 11.7*  HGB 14.2 13.2  HCT 41.9 39.5  MCV 91.5 91.6  PLT 330 283    Basic Metabolic Panel: Recent Labs  Lab 06/11/21 1423 06/12/21 0040  NA 133* 134*  K 3.6 2.9*  CL 99 104  CO2 23 21*  GLUCOSE 210* 205*  BUN 10 13  CREATININE 0.96 0.89  CALCIUM 8.7* 8.4*   GFR: Estimated Creatinine Clearance: 89.6 mL/min (by C-G formula based on SCr of 0.89 mg/dL). Recent Labs  Lab 06/11/21 0039 06/11/21 1423 06/11/21 1538 06/11/21 1727 06/12/21 0040  WBC  --  14.2*  --   --  14.3*  LATICACIDVEN 1.3  --  1.6 2.2*  --     Liver Function Tests: Recent Labs  Lab 06/11/21 1423 06/12/21 0040  AST 16 19  ALT 16 15  ALKPHOS 52 46  BILITOT 0.7 0.2*  PROT 7.1 6.4*  ALBUMIN 3.6 3.1*   No results for input(s): LIPASE, AMYLASE in the last 168 hours. No results for input(s): AMMONIA in the last 168 hours.  ABG    Component Value Date/Time   HCO3 22.7 06/11/2021 1725   ACIDBASEDEF 1.2 06/11/2021 1725   O2SAT 50.2 06/11/2021 1725     Coagulation Profile: Recent Labs  Lab 06/11/21 1423  INR 1.1    Cardiac Enzymes: No  results for input(s): CKTOTAL, CKMB, CKMBINDEX, TROPONINI in the last 168 hours.  HbA1C: Hemoglobin A1C  Date/Time Value Ref Range Status  05/19/2012 02:15 PM 6.9  Final   Hgb A1c MFr Bld  Date/Time Value Ref Range Status  10/21/2013 01:57 PM 8.4 (H) 4.6 - 6.5 % Final    Comment:    Glycemic Control Guidelines for People with Diabetes:Non Diabetic:  <6%Goal of Therapy: <7%Additional Action Suggested:  >8%   03/05/2012 11:25 AM 7.3 (H) 4.6 - 6.5 % Final    Comment:    Glycemic Control Guidelines for People with Diabetes:Non Diabetic:  <6%Goal of Therapy: <7%Additional Action Suggested:  >8%     CBG: Recent Labs  Lab 06/11/21 1719 06/11/21 2311 06/12/21 0317 06/12/21 0739 06/12/21 1225  GLUCAP 215* 246* 209* 150* 157*    Review of Systems:   Unable to assess   Past Medical History:  She,  has a past medical history of Anxiety, Arthritis, Bulging disc (L-5), Chicken pox, Depression, Diabetes mellitus,  Hyperlipidemia, Hypertension, Insomnia, Major bone defects, and Polycystic ovarian disease.   Surgical History:   Past Surgical History:  Procedure Laterality Date   csf leak in skull  2005   noted failed attempt to close CSF leak in skull   DILATION AND EVACUATION  1990   miscarriage   TONSILLECTOMY AND ADENOIDECTOMY  1973     Social History:   reports that she has been smoking. She has a 52.50 pack-year smoking history. She has never used smokeless tobacco. She reports that she does not drink alcohol and does not use drugs.   Family History:  Her family history includes Arthritis in her father and mother; Breast cancer in her maternal grandmother and sister; Diabetes in her paternal grandmother and sister; Heart disease in her father; Hyperlipidemia in her sister; Hypertension in her maternal grandmother and mother; Marfan syndrome in her father and sister; Prostate cancer in her father.   Allergies Allergies  Allergen Reactions   Statins Other (See Comments)     Other reaction(s): Other CSF leaking out nose. Headache   Glimepiride Other (See Comments)    Hypoglicemia     Home Medications  Prior to Admission medications   Medication Sig Start Date End Date Taking? Authorizing Provider  albuterol (PROVENTIL HFA;VENTOLIN HFA) 108 (90 BASE) MCG/ACT inhaler Inhale 2 puffs into the lungs every 6 (six) hours as needed for wheezing. 09/27/12  Yes Jearld Fenton, NP  aspirin EC 81 MG tablet Take 81 mg by mouth daily. Swallow whole.   Yes [provider]  clonazePAM (KLONOPIN) 0.5 MG tablet Take 1/2 tablet by mouth as needed for anxiety. Max of 1 tablet daily. Patient taking differently: Take 0.25-0.5 mg by mouth daily as needed for anxiety. 05/13/21  Yes   cyclobenzaprine (FLEXERIL) 10 MG tablet Take 1 tablet (10 mg) by mouth daily at bedtime as needed Patient taking differently: Take 10 mg by mouth daily as needed for muscle spasms. 05/13/21  Yes   diclofenac (VOLTAREN) 75 MG EC tablet Take 1 tablet (75 mg total) by mouth 2 (two) times daily. 03/28/21  Yes   insulin glargine (LANTUS SOLOSTAR) 100 UNIT/ML Solostar Pen INJECT 20 UNITS INTO THE SKIN AT BEDTIME. Patient taking differently: Inject 20 Units into the skin every evening. 01/15/21  Yes   liraglutide (VICTOZA) 18 MG/3ML SOPN Inject 0.2 mLs (1.2 mg dose) into the skin daily. Patient taking differently: Inject 1.2 mg into the skin daily. 02/28/21  Yes   lisinopril (ZESTRIL) 5 MG tablet Take 5 mg by mouth daily.   Yes [provider]  metFORMIN (GLUCOPHAGE-XR) 500 MG 24 hr tablet Take 500 mg with breakfast and 1000 mg with dinner. Patient taking differently: Take 500-1,000 mg by mouth See admin instructions. Takes 500 mg in the morning and 1000 mg at night 04/29/21  Yes   sertraline (ZOLOFT) 100 MG tablet Take 2 tablets (200 mg) by mouth with dinner Patient taking differently: Take 100 mg by mouth daily. 01/17/21  Yes   spironolactone (ALDACTONE) 25 MG tablet TAKE 1 TABLET BY MOUTH ONCE  DAILY Patient taking differently: Take 25 mg by mouth daily. 11/02/13  Yes Midge Minium, MD  tiZANidine (ZANAFLEX) 4 MG tablet Take 1 tablet by mouth every evening for muscle spasms and 1/2 tab as needed during daytime Patient taking differently: Take 2-4 mg by mouth daily as needed for muscle spasms. 02/27/21  Yes   WELLBUTRIN XL 300 MG 24 hr tablet Take 1 tablet (300 mg) by mouth daily  in the morning Patient taking differently: Take 300 mg by mouth daily. 04/30/21  Yes   clonazePAM (KLONOPIN) 0.5 MG tablet TAKE ONE HALF TABLET (0.25 MG DOSE) BY MOUTH DAILY AS NEEDED FOR ANXIETY. Patient not taking: Reported on 06/11/2021 07/27/20 01/23/21  Bertell Maria, FNP  COVID-19 mRNA Vac-TriS, Pfizer, (PFIZER-BIONT COVID-19 VAC-TRIS) SUSP injection Inject into the muscle. Patient not taking: Reported on 06/11/2021 11/08/20   Carlyle Basques, MD  Eszopiclone 3 MG TABS TAKE 1 TABLET BY MOUTH ONCE DAILY IMMEDIATELY BEFORE BEDTIME 08/02/20 01/29/21  Deveron Furlong, NP  famotidine (PEPCID) 20 MG tablet TAKE ONE TABLET (20 MG DOSE) BY MOUTH 2 (TWO) TIMES DAILY. Patient not taking: Reported on 06/11/2021 04/06/20 04/06/21  Bertell Maria, FNP  fenofibrate (TRICOR) 48 MG tablet Take one tablet (48 mg dose) by mouth daily. Patient not taking: Reported on 06/11/2021 05/20/21     fluticasone (FLONASE) 50 MCG/ACT nasal spray PLACE TWO SPRAYS BY NASAL ROUTE DAILY. Patient not taking: Reported on 06/11/2021 04/25/20 04/25/21  Bertell Maria, FNP  insulin glargine (LANTUS) 100 UNIT/ML Solostar Pen INJECT TEN UNITS INTO THE SKIN AT BEDTIME. Patient not taking: Reported on 06/11/2021 04/07/20 04/07/21  Bertell Maria, FNP  Insulin Pen Needle 31G X 8 MM MISC Use to inject Victoza daily as directed 09/27/20     lidocaine (LIDODERM) 5 % Place 1 patch onto the skin daily. (MAY WEAR UP TO 12HOURS.) Patient not taking: Reported on 06/11/2021 05/01/21     liraglutide (VICTOZA) 18 MG/3ML SOPN INJECT 0.2 MLS (1.2 MG DOSE) INTO THE SKIN  DAILY. Patient not taking: Reported on 06/11/2021 08/23/20 08/23/21  Bertell Maria, FNP  liraglutide (VICTOZA) 18 MG/3ML SOPN INJECT 0.2 MLS (1.2 MG DOSE) INTO THE SKIN DAILY. Patient not taking: Reported on 06/11/2021 04/06/20 04/06/21  Bertell Maria, FNP  metFORMIN (GLUCOPHAGE) 1000 MG tablet TAKE 1 TABLET (1,000 MG TOTAL) BY MOUTH 2 (TWO) TIMES DAILY WITH A MEAL. Patient not taking: Reported on 06/11/2021 09/23/13   Midge Minium, MD  metFORMIN (GLUCOPHAGE-XR) 500 MG 24 hr tablet TAKE 1 TABLET BY MOUTH WITH BREAKFAST AND 2 TABLETS WITH DINNER Patient not taking: Reported on 06/11/2021 04/06/20 04/06/21  Bertell Maria, FNP  sertraline (ZOLOFT) 100 MG tablet TAKE TWO TABLETS BY MOUTH DAILY Patient not taking: Reported on 06/11/2021 07/24/20 07/24/21  Bertell Maria, FNP  sertraline (ZOLOFT) 100 MG tablet TAKE TWO TABLETS (200 MG DOSE) BY MOUTH DAILY. Patient not taking: Reported on 06/11/2021 04/06/20 04/06/21  Bertell Maria, FNP  tiZANidine (ZANAFLEX) 4 MG tablet take 1/2-1 tablet by mouth every evening for muscle spasms Patient not taking: Reported on 06/11/2021 11/08/20     ULTICARE SHORT PEN NEEDLES 31G X 8 MM MISC Please use to inject Victoza once daily. ICD: E11.69, E78.2 12/27/20     vitamin B-12 (CYANOCOBALAMIN) 1000 MCG tablet Take one tablet (1,000 mcg dose) by mouth daily. Patient not taking: Reported on 06/11/2021 10/30/20     zolpidem (AMBIEN) 10 MG tablet TAKE ONE TABLET (10 MG DOSE) BY MOUTH AT BEDTIME AS NEEDED FOR SLEEP. Patient not taking: Reported on 06/11/2021 07/04/20 12/31/20    zolpidem (AMBIEN) 10 MG tablet TAKE 1 TABLET BY MOUTH AT BEDTIME AS NEEDED FOR SLEEP Patient not taking: Reported on 06/11/2021 06/06/20 12/03/20  Candis Musa  zolpidem (AMBIEN) 10 MG tablet TAKE 1 TABLET BY MOUTH EVERY NIGHT AT BEDTIME AS NEEDED FOR SLEEP Patient not taking: Reported on 06/11/2021 05/07/20 11/03/20  Bertell Maria, FNP  zolpidem (AMBIEN) 10 MG tablet TAKE ONE TABLET (10 MG DOSE) BY MOUTH AT BEDTIME  AS NEEDED FOR SLEEP. Patient not taking: Reported on 06/11/2021 04/06/20 10/03/20  Bertell Maria, FNP  zolpidem (AMBIEN) 5 MG tablet TAKE ONE TABLET (5 MG DOSE) BY MOUTH AT BEDTIME AS NEEDED FOR SLEEP. Patient not taking: Reported on 06/11/2021 03/12/20 09/08/20  Bertell Maria, FNP     Critical care time:     CRITICAL CARE Performed by: Cristal Generous   Total critical care time: 40 minutes  Critical care time was exclusive of separately billable procedures and treating other patients. Critical care was necessary to treat or prevent imminent or life-threatening deterioration.  Critical care was time spent personally by me on the following activities: development of treatment plan with patient and/or surrogate as well as nursing, discussions with consultants, evaluation of patient's response to treatment, examination of patient, obtaining history from patient or surrogate, ordering and performing treatments and interventions, ordering and review of laboratory studies, ordering and review of radiographic studies, pulse oximetry and re-evaluation of patient's condition.  Eliseo Gum MSN, AGACNP-BC [place] for pager  06/12/2021, 5:14 PM

## 2021-06-12 NOTE — ED Notes (Signed)
Pt placed on cooling blanket, rectal temp 102. MD at bedside

## 2021-06-12 NOTE — ED Notes (Addendum)
VO dr Charleen Kirks to hang IV tylenol early due to temperature

## 2021-06-12 NOTE — ED Notes (Signed)
This nurse tech and the RN for the patient Changed patients whole bed including cooling blanket and probe after large BM. New brief applied as well as new purewick. Clean linen put on the bed.

## 2021-06-12 NOTE — ED Notes (Signed)
Spoke with admitting about patients condition, aware of increased RR, decreased O2 saturation and increasing LOC. Patient continually to voice "I'm going to die", MD aware

## 2021-06-12 NOTE — Consult Note (Signed)
Chief Complaint: Patient was seen in consultation today for  Chief Complaint  Patient presents with   Altered Mental Status    Referring Physician(s): Dr. Evette Doffing   Supervising Physician: Corrie Mckusick  Patient Status: [facility] - ED  History of Present Illness: Lisa [name] is a 61 y.o. 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 She was brought to the ED 06/11/21 by  her husband due to worsening confusion after she experienced sudden onset headache and fever with CSF leaking from her nose. Her presentation is consistent with meningitis and encephalitis. Three attempts at a bedside lumbar puncture were made without success due to body habitus and agitation.   Interventional Radiology has been asked to evaluate this patient for an image-guided lumbar puncture. Case reviewed and procedure approved by Dr. Earleen Newport.   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    Polycystic ovarian disease     Past Surgical History:  Procedure Laterality Date   csf leak in skull  2005   noted failed attempt to close CSF leak in skull   DILATION AND EVACUATION  1990   miscarriage   TONSILLECTOMY AND ADENOIDECTOMY  1973    Allergies: Statins and Glimepiride  Medications: Prior to Admission medications   Medication Sig Start Date End Date Taking? Authorizing Provider  albuterol (PROVENTIL HFA;VENTOLIN HFA) 108 (90 BASE) MCG/ACT inhaler Inhale 2 puffs into the lungs every 6 (six) hours as needed for wheezing. 09/27/12  Yes Jearld Fenton, NP  aspirin EC 81 MG tablet Take 81 mg by mouth daily. Swallow whole.   Yes [provider]  clonazePAM (KLONOPIN) 0.5 MG tablet Take 1/2 tablet by mouth as needed  for anxiety. Max of 1 tablet daily. Patient taking differently: Take 0.25-0.5 mg by mouth daily as needed for anxiety. 05/13/21  Yes   cyclobenzaprine (FLEXERIL) 10 MG tablet Take 1 tablet (10 mg) by mouth daily at bedtime as needed Patient taking differently: Take 10 mg by mouth daily as needed for muscle spasms. 05/13/21  Yes   diclofenac (VOLTAREN) 75 MG EC tablet Take 1 tablet (75 mg total) by mouth 2 (two) times daily. 03/28/21  Yes   insulin glargine (LANTUS SOLOSTAR) 100 UNIT/ML Solostar Pen INJECT 20 UNITS INTO THE SKIN AT BEDTIME. Patient taking differently: Inject 20 Units into the skin every evening. 01/15/21  Yes   liraglutide (VICTOZA) 18 MG/3ML SOPN Inject 0.2 mLs (1.2 mg dose) into the skin daily. Patient taking differently: Inject 1.2 mg into the skin daily. 02/28/21  Yes   lisinopril (ZESTRIL) 5 MG tablet Take 5 mg by mouth daily.   Yes [provider]  metFORMIN (GLUCOPHAGE-XR) 500 MG 24 hr tablet Take 500 mg with breakfast and 1000 mg with dinner. Patient taking differently: Take 500-1,000 mg by mouth See admin instructions. Takes 500 mg in the morning and 1000 mg at night 04/29/21  Yes   sertraline (ZOLOFT) 100 MG tablet Take 2 tablets (200 mg) by mouth with dinner Patient taking differently: Take 100 mg by mouth daily. 01/17/21  Yes   spironolactone (ALDACTONE) 25 MG tablet TAKE 1 TABLET BY MOUTH ONCE  DAILY Patient taking differently: Take 25 mg by mouth daily. 11/02/13  Yes Midge Minium, MD  tiZANidine (ZANAFLEX) 4 MG tablet Take 1 tablet by mouth every evening for muscle spasms and 1/2 tab as needed during daytime Patient taking differently: Take 2-4 mg by mouth daily as needed for muscle spasms. 02/27/21  Yes   WELLBUTRIN XL 300 MG 24 hr tablet Take 1 tablet (300 mg) by mouth daily in the morning Patient taking differently: Take 300 mg by mouth daily. 04/30/21  Yes   clonazePAM (KLONOPIN) 0.5 MG tablet TAKE ONE HALF TABLET (0.25 MG DOSE) BY MOUTH DAILY AS NEEDED  FOR ANXIETY. Patient not taking: Reported on 06/11/2021 07/27/20 01/23/21  Bertell Maria, FNP  COVID-19 mRNA Vac-TriS, Pfizer, (PFIZER-BIONT COVID-19 VAC-TRIS) SUSP injection Inject into the muscle. Patient not taking: Reported on 06/11/2021 11/08/20   Carlyle Basques, MD  Eszopiclone 3 MG TABS TAKE 1 TABLET BY MOUTH ONCE DAILY IMMEDIATELY BEFORE BEDTIME 08/02/20 01/29/21  Deveron Furlong, NP  famotidine (PEPCID) 20 MG tablet TAKE ONE TABLET (20 MG DOSE) BY MOUTH 2 (TWO) TIMES DAILY. Patient not taking: Reported on 06/11/2021 04/06/20 04/06/21  Bertell Maria, FNP  fenofibrate (TRICOR) 48 MG tablet Take one tablet (48 mg dose) by mouth daily. Patient not taking: Reported on 06/11/2021 05/20/21     fluticasone (FLONASE) 50 MCG/ACT nasal spray PLACE TWO SPRAYS BY NASAL ROUTE DAILY. Patient not taking: Reported on 06/11/2021 04/25/20 04/25/21  Bertell Maria, FNP  insulin glargine (LANTUS) 100 UNIT/ML Solostar Pen INJECT TEN UNITS INTO THE SKIN AT BEDTIME. Patient not taking: Reported on 06/11/2021 04/07/20 04/07/21  Bertell Maria, FNP  Insulin Pen Needle 31G X 8 MM MISC Use to inject Victoza daily as directed 09/27/20     lidocaine (LIDODERM) 5 % Place 1 patch onto the skin daily. (MAY WEAR UP TO 12HOURS.) Patient not taking: Reported on 06/11/2021 05/01/21     liraglutide (VICTOZA) 18 MG/3ML SOPN INJECT 0.2 MLS (1.2 MG DOSE) INTO THE SKIN DAILY. Patient not taking: Reported on 06/11/2021 08/23/20 08/23/21  Bertell Maria, FNP  liraglutide (VICTOZA) 18 MG/3ML SOPN INJECT 0.2 MLS (1.2 MG DOSE) INTO THE SKIN DAILY. Patient not taking: Reported on 06/11/2021 04/06/20 04/06/21  Bertell Maria, FNP  metFORMIN (GLUCOPHAGE) 1000 MG tablet TAKE 1 TABLET (1,000 MG TOTAL) BY MOUTH 2 (TWO) TIMES DAILY WITH A MEAL. Patient not taking: Reported on 06/11/2021 09/23/13   Midge Minium, MD  metFORMIN (GLUCOPHAGE-XR) 500 MG 24 hr tablet TAKE 1 TABLET BY MOUTH WITH BREAKFAST AND 2 TABLETS WITH DINNER Patient not taking: Reported on  06/11/2021 04/06/20 04/06/21  Bertell Maria, FNP  sertraline (ZOLOFT) 100 MG tablet TAKE TWO TABLETS BY MOUTH DAILY Patient not taking: Reported on 06/11/2021 07/24/20 07/24/21  Bertell Maria, FNP  sertraline (ZOLOFT) 100 MG tablet TAKE TWO TABLETS (200 MG DOSE) BY MOUTH DAILY. Patient not taking: Reported on 06/11/2021 04/06/20 04/06/21  Bertell Maria, FNP  tiZANidine (ZANAFLEX) 4 MG tablet take 1/2-1 tablet by mouth every evening for muscle spasms Patient not taking: Reported on 06/11/2021 11/08/20     ULTICARE SHORT PEN NEEDLES 31G X 8 MM MISC Please use to inject Victoza once daily. ICD: E11.69, E78.2 12/27/20     vitamin B-12 (CYANOCOBALAMIN) 1000 MCG tablet Take one tablet (1,000 mcg dose) by mouth daily. Patient not taking: Reported on 06/11/2021 10/30/20     zolpidem (AMBIEN) 10 MG tablet TAKE ONE TABLET (10 MG DOSE) BY MOUTH AT  BEDTIME AS NEEDED FOR SLEEP. Patient not taking: Reported on 06/11/2021 07/04/20 12/31/20    zolpidem (AMBIEN) 10 MG tablet TAKE 1 TABLET BY MOUTH AT BEDTIME AS NEEDED FOR SLEEP Patient not taking: Reported on 06/11/2021 06/06/20 12/03/20  Candis Musa  zolpidem (AMBIEN) 10 MG tablet TAKE 1 TABLET BY MOUTH EVERY NIGHT AT BEDTIME AS NEEDED FOR SLEEP Patient not taking: Reported on 06/11/2021 05/07/20 11/03/20  Bertell Maria, FNP  zolpidem (AMBIEN) 10 MG tablet TAKE ONE TABLET (10 MG DOSE) BY MOUTH AT BEDTIME AS NEEDED FOR SLEEP. Patient not taking: Reported on 06/11/2021 04/06/20 10/03/20  Bertell Maria, FNP  zolpidem (AMBIEN) 5 MG tablet TAKE ONE TABLET (5 MG DOSE) BY MOUTH AT BEDTIME AS NEEDED FOR SLEEP. Patient not taking: Reported on 06/11/2021 03/12/20 09/08/20  Bertell Maria, FNP     Family History  Problem Relation Age of Onset   Arthritis Mother    Hypertension Mother    Arthritis Father    Prostate cancer Father    Heart disease Father    Marfan syndrome Father    Hyperlipidemia Sister    Diabetes Sister    Marfan syndrome Sister    Breast cancer Sister    Breast  cancer Maternal Grandmother    Hypertension Maternal Grandmother    Diabetes Paternal Grandmother     Social History   Socioeconomic History   Marital status: Married    Spouse name: Not on file   Number of children: Not on file   Years of education: Not on file   Highest education level: Not on file  Occupational History   Not on file  Tobacco Use   Smoking status: Every Day    Packs/day: 1.50    Years: 35.00    Pack years: 52.50    Types: Cigarettes   Smokeless tobacco: Never  Substance and Sexual Activity   Alcohol use: No   Drug use: No   Sexual activity: Not on file  Other Topics Concern   Not on file  Social History Narrative   Not on file   Social Determinants of Health   Financial Resource Strain: Not on file  Food Insecurity: Not on file  Transportation Needs: Not on file  Physical Activity: Not on file  Stress: Not on file  Social Connections: Not on file    Review of Systems: A 12 point ROS discussed and pertinent positives are indicated in the HPI above.  All other systems are negative.  Review of Systems  Unable to perform ROS: Mental status change   Vital Signs: BP 92/74    Pulse 98    Temp 99.1 F (37.3 C)    Resp (!) 22    Ht 5\' 6"  (1.676 m)    Wt 275 lb (124.7 kg)    LMP 11/02/2013    SpO2 99%    BMI 44.39 kg/m   Physical Exam Constitutional:      Appearance: She is ill-appearing.     Comments: Intermittently opens eyes, able to answer a few simples questions and follow a few simple commands. Bilateral wrist restraints in place.   HENT:     Mouth/Throat:     Mouth: Mucous membranes are dry.     Pharynx: Oropharynx is clear.  Cardiovascular:     Rate and Rhythm: Normal rate and regular rhythm.  Pulmonary:     Effort: Pulmonary effort is normal.  Neurological:     Mental Status: She is disoriented.    Imaging:  DG Chest 1 View  Result Date: 06/11/2021 CLINICAL DATA:  Fever. EXAM: CHEST  1 VIEW COMPARISON:  None. FINDINGS: Mild  cardiomegaly. Left lung is clear. Mild right basilar atelectasis or infiltrate is noted. The visualized skeletal structures are unremarkable. IMPRESSION: Mild right basilar atelectasis or infiltrate is noted. Electronically Signed   By: Marijo Conception M.D.   On: 06/11/2021 14:48   CT Head Wo Contrast  Result Date: 06/11/2021 CLINICAL DATA:  Mental status change. EXAM: CT HEAD WITHOUT CONTRAST TECHNIQUE: Contiguous axial images were obtained from the base of the skull through the vertex without intravenous contrast. COMPARISON:  None. FINDINGS: Brain: No intracranial hemorrhage, mass effect, or midline shift. Normal for age brain volume. Slight periventricular chronic small vessel ischemia. No hydrocephalus. The basilar cisterns are patent. No evidence of territorial infarct or acute ischemia. No extra-axial or intracranial fluid collection. Vascular: No hyperdense vessel or unexpected calcification. Skull: No skull fracture or acute findings. Presumed symmetric prominent arachnoid granulations in the anterior temporal bones, nonaggressive in appearance. Sinuses/Orbits: Complete opacification of both mastoid air cells. Mucosal thickening of both sphenoid sinuses and scattered throughout ethmoid air cells. No sinus fluid levels. Orbits are unremarkable. Other: None. IMPRESSION: 1. No acute intracranial abnormality. 2. Complete opacification of both mastoid air cells. Mucosal thickening of both sphenoid sinuses and scattered throughout ethmoid air cells. Findings are of unknown acuity, may represent mastoiditis or sinusitis in the appropriate clinical setting. Electronically Signed   By: Keith Rake M.D.   On: 06/11/2021 19:28    Labs:  CBC: Recent Labs    06/11/21 1423 06/12/21 0040  WBC 14.2* 14.3*  HGB 14.2 13.2  HCT 41.9 39.5  PLT 330 312    COAGS: Recent Labs    06/11/21 1423  INR 1.1  APTT 30    BMP: Recent Labs    06/11/21 1423 06/12/21 0040  NA 133* 134*  K 3.6 2.9*  CL 99  104  CO2 23 21*  GLUCOSE 210* 205*  BUN 10 13  CALCIUM 8.7* 8.4*  CREATININE 0.96 0.89  GFRNONAA >60 >60    LIVER FUNCTION TESTS: Recent Labs    06/11/21 1423 06/12/21 0040  BILITOT 0.7 0.2*  AST 16 19  ALT 16 15  ALKPHOS 52 46  PROT 7.1 6.4*  ALBUMIN 3.6 3.1*    TUMOR MARKERS: No results for input(s): AFPTM, CEA, CA199, CHROMGRNA in the last 8760 hours.  Assessment and Plan:  History of CSF leak; suspected meningitis: Lisa [name], 61 year old female, is tentatively scheduled today for an image-guided lumbar puncture with moderate sedation. The procedure was discussed with the patient's husband Shawn and the consent has been signed by him.   Risks and benefits of this procedure were discussed with the patient and/or patient's family including, but not limited to bleeding, infection and damage to adjacent structures  All of the questions were answered and there is agreement to proceed. She has been NPO.   Consent signed and in IR.  Thank you for this interesting consult.  I greatly enjoyed meeting Lisa [name] and look forward to participating in their care.  A copy of this report was sent to the requesting provider on this date.  Electronically Signed: Soyla Dryer, AGACNP-BC [phone] 06/12/2021, 12:09 PM   I spent a total of 20 Minutes    in face to face in clinical consultation, greater than 50% of which was counseling/coordinating care for image-guided lumbar puncture with moderate sedation.

## 2021-06-12 NOTE — ED Notes (Signed)
admitting Provider at bedside. 

## 2021-06-12 NOTE — Progress Notes (Signed)
Inpatient Diabetes Program Recommendations  AACE/ADA: New Consensus Statement on Inpatient Glycemic Control (2015)  Target Ranges:  Prepandial:   less than 140 mg/dL      Peak postprandial:   less than 180 mg/dL (1-2 hours)      Critically ill patients:  140 - 180 mg/dL   Lab Results  Component Value Date   GLUCAP 150 (H) 06/12/2021   HGBA1C 8.4 (H) 10/21/2013    Review of Glycemic Control  Latest Reference Range & Units 06/11/21 17:19 06/11/21 23:11 06/12/21 03:17 06/12/21 07:39  Glucose-Capillary 70 - 99 mg/dL 215 (H) 246 (H) 209 (H) 150 (H)   Diabetes history: DM 2 Outpatient Diabetes medications:  Lantus 20 units q PM, Victoza 1.2 mg daily, Metformin 500 mg q AM and 1000 mg q PM  Current orders for Inpatient glycemic control:  Novolog moderate q 4 hours  Inpatient Diabetes Program Recommendations:   Please consider adding Semglee 10 units daily.    Thanks,  Adah Perl, RN, BC-ADM Inpatient Diabetes Coordinator Pager [phone]  (8a-5p)

## 2021-06-12 NOTE — ED Notes (Signed)
Pt changed with full linen change and full monitor equipment

## 2021-06-12 NOTE — Progress Notes (Addendum)
° °  GHALIA REICKS is a 61 y.o. female with a pertinent PMH of HTN, HLD, T2DM, and hx of CSF leaks admitted for acute toxic metabolic encephalopathy in setting of potential acute bacterial meningitis.   Subjective:  Patient appeared agitated when examined at bedside. She kept removing her sheets and stating it was cold. Her altered mental status limited our interview.    Objective:  Vital signs in last 24 hours: Vitals:   06/12/21 0715 06/12/21 0733 06/12/21 0800 06/12/21 0810  BP: (!) 118/108 129/73 122/77   Pulse: 90 99 95 91  Resp: (!) 24 19 (!) 28 (!) 22  Temp:    98.7 F (37.1 C)  TempSrc:      SpO2: 98% 97% 96% 97%  Weight:      Height:       Supplemental O2: Room Air SpO2: 97 % O2 Flow Rate (L/min): 6 L/min  Physical Exam General: agitated, in distress Head: Normocephalic without scalp lesions.   Mouth and Throat: Lips normal color, without lesions.  Neck: Pain with neck movement Lungs: CTAB on anterior ausculation Cardiovascular: Normal heart sounds, no r/m/g, 2+ pulses radially Skin: warm, dry good skin turgor, no lesions noted Neuro: disoriented which limited neuro exam. Was able to move all extremities and open eyes to command.    Filed Weights   06/11/21 1422  Weight: 124.7 kg     Intake/Output Summary (Last 24 hours) at 06/12/2021 1638 Last data filed at 06/12/2021 0800 Gross per 24 hour  Intake 1300 ml  Output --  Net 1300 ml   Net IO Since Admission: 1,300 mL [06/12/21 0823]  Assessment/Plan:  JULIANI LADUKE is a 61 y.o. female with a pertinent PMH of HTN, HLD, T2DM, and hx of CSF leaks admitted for acute toxic metabolic encephalopathy in setting of potential acute bacterial meningitis.   Probable Acute Bacterial Meningitis Acute metabolic encephalopathy At risk for sepsis  Patient presented with altered mental status with symptoms concerning for meningitis. LP planned today. Until LP results are available will empirically treat for acute  bacterial meningitis. She has received haldol without benefit. She was given Zyprexa 2.5 mg IM.  -Continue Vancomycin 1 g q12 hrs, Ceftriaxone 2 g q12 hours, ampicillin 2g q4 hrs -Tylenol prn -IV Ativan prn for agitation -Continue LR  -f/u blood cultures -F/u LP results -Monitor CBC and CMP  Tachypnea Appears compensatory for her acute illness. Will monitor patient oxygen requirement. Chest xray without infiltrates but is getting IV fluids. No crackles on exam today.   T2DM Patient has T2DM. Home regimen is Metformin 500 mg TID at home. Last A1c is 8.4 from 10/2013. Will hold metformin while inpatient.  -SSI -Repeat A1c with morning labs -CTM CBG trend before adding long acting  HTN Patient has hx of htn but not on anti-hypertensive. Since she is in acute distress during this hospitalization. Will evaluate her need for chronic antihypertensive outpatient.   HLD Patient has hx of HLD but not on statin. No recent lipid panel.  -Lipid panel with morning labs.    Diet: NPO IVF: LR,75cc/hr VTE: Lovenox after LP Code: Full Prior to Admission Living Arrangement: Home Anticipated Discharge Location:TBD Barriers to Discharge: Medical workup Dispo: Anticipated discharge in approximately 3 day(s).   Idamae Schuller, MD Tillie Rung. [facility] Internal Medicine Residency, PGY-1  Pager: [phone] After 5 pm and on weekends: Please call the on-call pager

## 2021-06-12 NOTE — Sedation Documentation (Signed)
Pt continues to be extremely agitated despite versed administration. Pt is moaning, tachycardic, febrile. Pt is writhing in the bed and unable to be still at all. Pt is not following commands.

## 2021-06-12 NOTE — Sedation Documentation (Signed)
Pt transported from ED to IR 1 to attempt LP procedure. Dr Earleen Newport and Rad NP at bedside. Vitals charted. Awaiting orders

## 2021-06-12 NOTE — Consult Note (Signed)
NEUROLOGY CONSULTATION NOTE   Date of service: June 12, 2021 Patient Name: Lisa [name] MRN:  [id number] DOB:  [birth year] Reason for consult: "bacterial meningitis" Requesting Provider: Margaretha Seeds, MD _ _ _   _ __   _ __ _ _  __ __   _ __   __ _  History of Present Illness  Lisa [name] is a 61 y.o. 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 High suspicion for acute bacterial meningitis and she was started on Empiric meningitis coverage with Vanc, Cefepime and Ampicillin, then transitioned to Vanc, Ceftriaxone and Ampicillin. LP attempted in the ED at bedside and failed and thus was attempted under fluoro. She was intubated and transferred to ICU.  Neurology consulted for further workup for encephalopathy and assistance with management of potential bacterial meningitis.  She is currently intubated and on propofol gtt @ 50, unable to provide any history secondary to obtunded mentation.  Workup so far with CTH with no acute intracranial abnormality but notable for sinusitis and mastoiditis.   ROS   Unable to obtain 2/2 intubation and sedation.  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    Polycystic ovarian disease    Past Surgical History:  Procedure Laterality Date   csf leak in skull  2005   noted failed attempt to close CSF leak in skull   DILATION AND EVACUATION  1990   miscarriage   IR FLUORO GUIDED NEEDLE PLC ASPIRATION/INJECTION LOC  06/12/2021   TONSILLECTOMY AND ADENOIDECTOMY  1973   Family History  Problem Relation Age of Onset   Arthritis Mother    Hypertension Mother    Arthritis  Father    Prostate cancer Father    Heart disease Father    Marfan syndrome Father    Hyperlipidemia Sister    Diabetes Sister    Marfan syndrome Sister    Breast cancer Sister    Breast cancer Maternal Grandmother    Hypertension Maternal Grandmother    Diabetes Paternal Grandmother    Social History   Socioeconomic History   Marital status: Married    Spouse name: Not on file   Number of children: Not on file   Years of education: Not on file   Highest education level: Not on file  Occupational History   Not on file  Tobacco Use   Smoking status: Every Day    Packs/day: 1.50    Years: 35.00    Pack years: 52.50    Types: Cigarettes   Smokeless tobacco: Never  Substance and Sexual Activity   Alcohol use: No   Drug use: No   Sexual activity: Not on file  Other Topics Concern   Not on file  Social History Narrative   Not on file   Social Determinants of Health   Financial Resource Strain: Not on file  Food Insecurity: Not on file  Transportation Needs: Not on file  Physical Activity: Not on file  Stress: Not on file  Social Connections: Not on file   Allergies  Allergen Reactions  Statins Other (See Comments)    Other reaction(s): Other CSF leaking out nose. Headache   Glimepiride Other (See Comments)    Hypoglicemia    Medications   Medications Prior to Admission  Medication Sig Dispense Refill Last Dose   albuterol (PROVENTIL HFA;VENTOLIN HFA) 108 (90 BASE) MCG/ACT inhaler Inhale 2 puffs into the lungs every 6 (six) hours as needed for wheezing. 1 Inhaler 0 unk last dose   aspirin EC 81 MG tablet Take 81 mg by mouth daily. Swallow whole.   06/10/2021   clonazePAM (KLONOPIN) 0.5 MG tablet Take 1/2 tablet by mouth as needed for anxiety. Max of 1 tablet daily. (Patient taking differently: Take 0.25-0.5 mg by mouth daily as needed for anxiety.) 15 tablet 0 06/10/2021   cyclobenzaprine (FLEXERIL) 10 MG tablet Take 1 tablet (10 mg) by  mouth daily at bedtime as needed (Patient taking differently: Take 10 mg by mouth daily as needed for muscle spasms.) 30 tablet 0 06/10/2021   diclofenac (VOLTAREN) 75 MG EC tablet Take 1 tablet (75 mg total) by mouth 2 (two) times daily. 180 tablet 0 unk last dose   insulin glargine (LANTUS SOLOSTAR) 100 UNIT/ML Solostar Pen INJECT 20 UNITS INTO THE SKIN AT BEDTIME. (Patient taking differently: Inject 20 Units into the skin every evening.) 15 mL 1 06/10/2021   liraglutide (VICTOZA) 18 MG/3ML SOPN Inject 0.2 mLs (1.2 mg dose) into the skin daily. (Patient taking differently: Inject 1.2 mg into the skin daily.) 18 mL 1 06/10/2021   lisinopril (ZESTRIL) 5 MG tablet Take 5 mg by mouth daily.   06/10/2021   metFORMIN (GLUCOPHAGE-XR) 500 MG 24 hr tablet Take 500 mg with breakfast and 1000 mg with dinner. (Patient taking differently: Take 500-1,000 mg by mouth See admin instructions. Takes 500 mg in the morning and 1000 mg at night) 270 tablet 1 06/10/2021   sertraline (ZOLOFT) 100 MG tablet Take 2 tablets (200 mg) by mouth with dinner (Patient taking differently: Take 100 mg by mouth daily.) 180 tablet 1 06/10/2021   spironolactone (ALDACTONE) 25 MG tablet TAKE 1 TABLET BY MOUTH ONCE DAILY (Patient taking differently: Take 25 mg by mouth daily.) 90 tablet 2 06/10/2021   tiZANidine (ZANAFLEX) 4 MG tablet Take 1 tablet by mouth every evening for muscle spasms and 1/2 tab as needed during daytime (Patient taking differently: Take 2-4 mg by mouth daily as needed for muscle spasms.) 45 tablet 2 06/10/2021   WELLBUTRIN XL 300 MG 24 hr tablet Take 1 tablet (300 mg) by mouth daily in the morning (Patient taking differently: Take 300 mg by mouth daily.) 30 tablet 2 06/10/2021   clonazePAM (KLONOPIN) 0.5 MG tablet TAKE ONE HALF TABLET (0.25 MG DOSE) BY MOUTH DAILY AS NEEDED FOR ANXIETY. (Patient not taking: Reported on 06/11/2021) 15 tablet 0 Not Taking   COVID-19 mRNA Vac-TriS, Pfizer, (PFIZER-BIONT COVID-19 VAC-TRIS) SUSP  injection Inject into the muscle. (Patient not taking: Reported on 06/11/2021) 0.3 mL 0 Not Taking   Eszopiclone 3 MG TABS TAKE 1 TABLET BY MOUTH ONCE DAILY IMMEDIATELY BEFORE BEDTIME 30 tablet 0    famotidine (PEPCID) 20 MG tablet TAKE ONE TABLET (20 MG DOSE) BY MOUTH 2 (TWO) TIMES DAILY. (Patient not taking: Reported on 06/11/2021) 180 tablet 1 Not Taking   fenofibrate (TRICOR) 48 MG tablet Take one tablet (48 mg dose) by mouth daily. (Patient not taking: Reported on 06/11/2021) 90 tablet 0 Not Taking   fluticasone (FLONASE) 50 MCG/ACT nasal spray PLACE TWO SPRAYS BY NASAL ROUTE DAILY. (  Patient not taking: Reported on 06/11/2021) 16 g 1 Not Taking   insulin glargine (LANTUS) 100 UNIT/ML Solostar Pen INJECT TEN UNITS INTO THE SKIN AT BEDTIME. (Patient not taking: Reported on 06/11/2021) 15 mL 1 Not Taking   Insulin Pen Needle 31G X 8 MM MISC Use to inject Victoza daily as directed 100 each 2    lidocaine (LIDODERM) 5 % Place 1 patch onto the skin daily. (MAY WEAR UP TO 12HOURS.) (Patient not taking: Reported on 06/11/2021) 30 patch 0 Not Taking   liraglutide (VICTOZA) 18 MG/3ML SOPN INJECT 0.2 MLS (1.2 MG DOSE) INTO THE SKIN DAILY. (Patient not taking: Reported on 06/11/2021) 18 mL 1 Not Taking   liraglutide (VICTOZA) 18 MG/3ML SOPN INJECT 0.2 MLS (1.2 MG DOSE) INTO THE SKIN DAILY. (Patient not taking: Reported on 06/11/2021) 18 mL 1 Not Taking   metFORMIN (GLUCOPHAGE) 1000 MG tablet TAKE 1 TABLET (1,000 MG TOTAL) BY MOUTH 2 (TWO) TIMES DAILY WITH A MEAL. (Patient not taking: Reported on 06/11/2021) 60 tablet 0 Not Taking   metFORMIN (GLUCOPHAGE-XR) 500 MG 24 hr tablet TAKE 1 TABLET BY MOUTH WITH BREAKFAST AND 2 TABLETS WITH DINNER (Patient not taking: Reported on 06/11/2021) 270 tablet 1 Not Taking   sertraline (ZOLOFT) 100 MG tablet TAKE TWO TABLETS BY MOUTH DAILY (Patient not taking: Reported on 06/11/2021) 180 tablet 0 Not Taking   sertraline (ZOLOFT) 100 MG tablet TAKE TWO TABLETS (200 MG DOSE) BY MOUTH  DAILY. (Patient not taking: Reported on 06/11/2021) 180 tablet 0 Not Taking   tiZANidine (ZANAFLEX) 4 MG tablet take 1/2-1 tablet by mouth every evening for muscle spasms (Patient not taking: Reported on 06/11/2021) 30 tablet 2 Not Taking   ULTICARE SHORT PEN NEEDLES 31G X 8 MM MISC Please use to inject Victoza once daily. ICD: E11.69, E78.2 100 each 2    vitamin B-12 (CYANOCOBALAMIN) 1000 MCG tablet Take one tablet (1,000 mcg dose) by mouth daily. (Patient not taking: Reported on 06/11/2021) 100 tablet 3 Not Taking   zolpidem (AMBIEN) 10 MG tablet TAKE ONE TABLET (10 MG DOSE) BY MOUTH AT BEDTIME AS NEEDED FOR SLEEP. (Patient not taking: Reported on 06/11/2021) 30 tablet 0 Not Taking   zolpidem (AMBIEN) 10 MG tablet TAKE 1 TABLET BY MOUTH AT BEDTIME AS NEEDED FOR SLEEP (Patient not taking: Reported on 06/11/2021) 30 tablet 0 Not Taking   zolpidem (AMBIEN) 10 MG tablet TAKE 1 TABLET BY MOUTH EVERY NIGHT AT BEDTIME AS NEEDED FOR SLEEP (Patient not taking: Reported on 06/11/2021) 30 tablet 0 Not Taking   zolpidem (AMBIEN) 10 MG tablet TAKE ONE TABLET (10 MG DOSE) BY MOUTH AT BEDTIME AS NEEDED FOR SLEEP. (Patient not taking: Reported on 06/11/2021) 30 tablet 0 Not Taking   zolpidem (AMBIEN) 5 MG tablet TAKE ONE TABLET (5 MG DOSE) BY MOUTH AT BEDTIME AS NEEDED FOR SLEEP. (Patient not taking: Reported on 06/11/2021) 30 tablet 0 Not Taking     Vitals   Vitals:   06/12/21 1453 06/12/21 1640 06/12/21 1644 06/12/21 1700  BP: 133/64  125/77 (!) 120/59  Pulse: (!) 119  (!) 110 (!) 117  Resp: (!) 37  20 18  Temp: (!) 104 F (40 C)   (!) 102.9 F (39.4 C)  TempSrc: Rectal   Esophageal  SpO2: 99% 100% 100% 100%  Weight:   118.5 kg   Height:         Body mass index is 42.17 kg/m.  Physical Exam   General: Ill appearing, laying comfortably in  bed, intubated. HENT: Normal oropharynx and mucosa. Normal external appearance of ears and nose.  Neck: Supple, no pain or tenderness  CV: No JVD. No peripheral  edema. Pulmonary: Symmetric Chest rise. Breathing over vent. Abdomen: Soft to touch, non-tender.  Ext: No cyanosis, edema, or deformity Skin: No rash. Normal palpation of skin.  Musculoskeletal: Normal digits and nails by inspection. No clubbing.  Neurologic Examination  Mental status/Cognition: eyes closed, no response to voice. Moves all extremities to tactile stimulation and grimaces to vigorous tactile stimulation. Speech/language: intubated, sedated, no attempts to communicate. Cranial nerves:   CN II Pupils equal and reactive to light, unable to assess for VF defects.   CN III,IV,VI EOM intact to dolls eyes, no gaze preference or deviation, no nystagmus   CN V Corneals positive BL   CN VII no asymmetry, no nasolabial fold flattening   CN VIII Does not turn head towards speech.   CN IX & X Positive cough, unable to get far back in the oropharynx to elicit a gag.   CN XI Unable to assess.   CN XII midline tongue, does not protrude to command.   Motor/sensory:  Muscle bulk: normal, tone decreased in all extremities. Unable to do detailed strength testing due to intubation and sedation. She localizes to pain in BL upper extremities. She withdraws to pain in BL lower extremities.  Reflexes:  Right Left Comments  Pectoralis      Biceps (C5/6) 2 2   Brachioradialis (C5/6) 2 2    Triceps (C6/7) 2 2    Patellar (L3/4) 2 2    Achilles (S1)      Hoffman      Plantar     Jaw jerk    Sensation:  Light touch    Pin prick    Temperature    Vibration   Proprioception    Coordination/Complex Motor:  Unable to assess due to intubation and sedation.  Labs   CBC:  Recent Labs  Lab 06/11/21 1423 06/12/21 0040 06/12/21 1722  WBC 14.2* 14.3*  --   NEUTROABS 12.2* 11.7*  --   HGB 14.2 13.2 11.9*  HCT 41.9 39.5 35.0*  MCV 91.5 91.6  --   PLT 330 312  --     Basic Metabolic Panel:  Lab Results  Component Value Date   NA 142 06/12/2021   K 3.3 (L) 06/12/2021   CO2 21  (L) 06/12/2021   GLUCOSE 205 (H) 06/12/2021   BUN 13 06/12/2021   CREATININE 0.89 06/12/2021   CALCIUM 8.4 (L) 06/12/2021   GFRNONAA >60 06/12/2021   Lipid Panel:  Lab Results  Component Value Date   LDLCALC 78 10/21/2013   HgbA1c:  Lab Results  Component Value Date   HGBA1C 8.4 (H) 10/21/2013   Urine Drug Screen: No results found for: LABOPIA, COCAINSCRNUR, LABBENZ, AMPHETMU, THCU, LABBARB  Alcohol Level No results found for: [facility]  CT Head without contrast(Personally reviewed): 1. No acute intracranial abnormality. 2. Complete opacification of both mastoid air cells. Mucosal thickening of both sphenoid sinuses and scattered throughout ethmoid air cells. Findings are of unknown acuity, may represent mastoiditis or sinusitis in the appropriate clinical setting.  MRI Brain: pending  rEEG:  Pending  WBC, CSF 0 - 5 /cu mm 6,425 High Panic     Glucose, CSF 40 - 70 mg/dL 32 Low    Total  Protein, CSF 15 - 45 mg/dL 283 High     Impression   Lisa [name] is a  61 y.o. 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 CSF findings highly suspicious for bacterial meningitis with significant neutrophilic pleocytosis, hypoglycorrhachia.  Agree with empiric coverage with Vanc, Ceftriaxone(meningitis dosing) and Ampicillin. Possible source is likely seeding from sinusitis/mastoiditis.  Given poor gcs and intubation, will also get MRI Brain and a routine EEG to evaluate for seizures. Recommendations  - Empiric meningitis coverage with Vanc, Ceftriaxone and Ampicillin - Less likely to be viral meningitis. CSF profile fits more with bacterial meningitis. - Strep Pneumon Urinary Antigen. - CSF cultures pending. - MRI Brain with and without contrast - routine EEG. - further workup pending  above. ______________________________________________________________________   Thank you for the opportunity to take part in the care of this patient. If you have any further questions, please contact the neurology consultation attending.  Signed,  Swan Pager Number [phone] _ _ _   _ __   _ __ _ _  __ __   _ __   __ _

## 2021-06-12 NOTE — Sedation Documentation (Signed)
Anesthesia in to resume care of pt. Per Dr Earleen Newport, anesthesia needed to continue with procedure. Erlene Quan CRNA at bedside

## 2021-06-12 NOTE — ED Notes (Signed)
Pt cleaned up with full linen change along with full monitor change

## 2021-06-12 NOTE — ED Notes (Signed)
Pt attempt to pulling of monitoring equipment, get out of bed and yelling, pt altered, non violent restraints in place. Ice packs attempted to be applied but patient continues to kick them off

## 2021-06-12 NOTE — Anesthesia Preprocedure Evaluation (Addendum)
Anesthesia Evaluation  Patient identified by MRN, date of birth, ID band Patient confused    Reviewed: Allergy & Precautions, H&P , NPO status , Patient's Chart, lab work & pertinent test results, reviewed documented beta blocker date and time , Unable to perform ROS - Chart review onlyPreop documentation limited or incomplete due to emergent nature of procedure.  Airway Mallampati: II  TM Distance: >3 FB Neck ROM: full    Dental no notable dental hx.    Pulmonary neg pulmonary ROS, Current Smoker,    Pulmonary exam normal breath sounds clear to auscultation       Cardiovascular Exercise Tolerance: Good hypertension,  Rhythm:regular Rate:Normal     Neuro/Psych PSYCHIATRIC DISORDERS Anxiety Depression negative neurological ROS     GI/Hepatic negative GI ROS, Neg liver ROS,   Endo/Other  diabetes, Type 2Morbid obesity  Renal/GU negative Renal ROS  negative genitourinary   Musculoskeletal  (+) Arthritis , Osteoarthritis,    Abdominal   Peds  Hematology negative hematology ROS (+)   Anesthesia Other Findings   Reproductive/Obstetrics negative OB ROS                             Anesthesia Physical Anesthesia Plan  ASA: 4 and emergent  Anesthesia Plan: General   Post-op Pain Management: Minimal or no pain anticipated   Induction: Intravenous  PONV Risk Score and Plan: 2  Airway Management Planned: Oral ETT  Additional Equipment: None  Intra-op Plan:   Post-operative Plan: Extubation in OR  Informed Consent: I have reviewed the patients History and Physical, chart, labs and discussed the procedure including the risks, benefits and alternatives for the proposed anesthesia with the patient or authorized representative who has indicated his/her understanding and acceptance.     Dental Advisory Given  Plan Discussed with: CRNA and Anesthesiologist  Anesthesia Plan Comments:  (61 year old person living with diabetes and a history of a temporal bone abnormality leading to intermittent CSF leakage came to the emergency department with acute encephalopathy and fever, admitted to our service for management of probable acute bacterial meningitis.  Patient is unable to provide any history due to encephalopathy.  Her husband tells me that she has had an intermittent CSF leak for about 15 years.  Typically if she lifts a heavy object she will notice some serous drainage from her ear and then into her nose, this will resolve after a few hours, she then develops a headache for about 2 days, and then is back to normal.  This is the first time she has had a febrile illness associated with a CSF leaking event.)       Anesthesia Quick Evaluation

## 2021-06-12 NOTE — Transfer of Care (Signed)
Immediate Anesthesia Transfer of Care Note  Patient: Lisa [name]  Procedure(s) Performed: IR WITH ANESTHESIA  Patient Location: ICU  Anesthesia Type:General  Level of Consciousness: drowsy and patient cooperative  Airway & Oxygen Therapy: Patient remains intubated per anesthesia plan and Patient placed on Ventilator (see vital sign flow sheet for setting)  Post-op Assessment: Report given to RN and Post -op Vital signs reviewed and stable  Post vital signs: Reviewed and stable  Last Vitals:  Vitals Value Taken Time  BP 125/77 06/12/21 1644  Temp    Pulse 113 06/12/21 1650  Resp 15 06/12/21 1650  SpO2 100 % 06/12/21 1650  Vitals shown include unvalidated device data.  Last Pain:  Vitals:   06/12/21 1453  TempSrc: Rectal         Complications: No notable events documented.

## 2021-06-12 NOTE — Anesthesia Procedure Notes (Signed)
Procedure Name: Intubation Date/Time: 06/12/2021 3:12 PM Performed by: Lance Coon, CRNA Pre-anesthesia Checklist: Patient identified, Emergency Drugs available, Patient being monitored, Suction available and Timeout performed Patient Re-evaluated:Patient Re-evaluated prior to induction Oxygen Delivery Method: Circle system utilized Preoxygenation: Pre-oxygenation with 100% oxygen Induction Type: IV induction Laryngoscope Size: Miller and 3 Grade View: Grade II Tube type: Oral Tube size: 7.0 mm Number of attempts: 1 Airway Equipment and Method: Stylet Placement Confirmation: ETT inserted through vocal cords under direct vision, positive ETCO2 and breath sounds checked- equal and bilateral Secured at: 23 cm Tube secured with: Tape Dental Injury: Teeth and Oropharynx as per pre-operative assessment

## 2021-06-12 NOTE — Progress Notes (Signed)
ABI's have been completed. Preliminary results can be found in CV Proc through chart review.  Results were given to Dr. Corinna Lines.  06/12/21 7:26 PM Lisa [name] RVT

## 2021-06-12 NOTE — Progress Notes (Signed)
°  Transition of Care [facility]) Screening Note   Patient Details  Name: Lisa [name] Date of Birth: April 17, 1961   Transition of Care [facility]) CM/SW Contact:    Ella Bodo, RN Phone Number: 06/12/2021, 5:00 PM    Transition of Care Department [facility]) has reviewed patient and no TOC needs have been identified at this time. We will continue to monitor patient advancement through interdisciplinary progression rounds. If new patient transition needs arise, please place a TOC consult.    Reinaldo Raddle, RN, BSN  Trauma/Neuro ICU Case Manager [phone]

## 2021-06-12 NOTE — Progress Notes (Signed)
Dr. Loanne Drilling CCM notified of new cyanosis in pt.'s bilateral toes and increased paleness of lower legs since admission. See new orders.    06/12/21 1800  RLE Neurovascular Assessment  RLE Capillary Refill  Greater than 3 seconds  RLE Color  Pale;Mottled;Cyanotic  RLE Temperature/Moisture  Cold  R Dorsalis Pedis Pulse +1  LLE Neurovascular Assessment  LLE Capillary Refill  Greater than 3 seconds  LLE Color  Pale;Mottled;Cyanotic  LLE Temperature/Moisture  Cold  L Dorsalis Pedis Pulse Doppler

## 2021-06-12 NOTE — Procedures (Signed)
Technically successful L3-L4 lumbar puncture yielding 12 ml of cloudy CSF for laboratory studies. Please see full report under Imaging in Epic.  Soyla Dryer, [place] [phone] 06/12/2021, 4:07 PM

## 2021-06-13 ENCOUNTER — Inpatient Hospital Stay (HOSPITAL_COMMUNITY): Payer: Commercial Managed Care - HMO

## 2021-06-13 ENCOUNTER — Encounter (HOSPITAL_COMMUNITY): Payer: Self-pay | Admitting: Radiology

## 2021-06-13 DIAGNOSIS — R6521 Severe sepsis with septic shock: Secondary | ICD-10-CM

## 2021-06-13 DIAGNOSIS — G009 Bacterial meningitis, unspecified: Secondary | ICD-10-CM | POA: Diagnosis not present

## 2021-06-13 DIAGNOSIS — G9340 Encephalopathy, unspecified: Secondary | ICD-10-CM

## 2021-06-13 DIAGNOSIS — A419 Sepsis, unspecified organism: Secondary | ICD-10-CM | POA: Diagnosis not present

## 2021-06-13 DIAGNOSIS — G9341 Metabolic encephalopathy: Secondary | ICD-10-CM | POA: Diagnosis not present

## 2021-06-13 DIAGNOSIS — G9600 Cerebrospinal fluid leak, unspecified: Secondary | ICD-10-CM

## 2021-06-13 DIAGNOSIS — A413 Sepsis due to Hemophilus influenzae: Principal | ICD-10-CM

## 2021-06-13 DIAGNOSIS — J9601 Acute respiratory failure with hypoxia: Secondary | ICD-10-CM

## 2021-06-13 DIAGNOSIS — J9600 Acute respiratory failure, unspecified whether with hypoxia or hypercapnia: Secondary | ICD-10-CM

## 2021-06-13 DIAGNOSIS — L8990 Pressure ulcer of unspecified site, unspecified stage: Secondary | ICD-10-CM | POA: Insufficient documentation

## 2021-06-13 LAB — GLUCOSE, CAPILLARY
Glucose-Capillary: 117 mg/dL — ABNORMAL HIGH (ref 70–99)
Glucose-Capillary: 126 mg/dL — ABNORMAL HIGH (ref 70–99)
Glucose-Capillary: 129 mg/dL — ABNORMAL HIGH (ref 70–99)
Glucose-Capillary: 131 mg/dL — ABNORMAL HIGH (ref 70–99)
Glucose-Capillary: 132 mg/dL — ABNORMAL HIGH (ref 70–99)
Glucose-Capillary: 148 mg/dL — ABNORMAL HIGH (ref 70–99)
Glucose-Capillary: 157 mg/dL — ABNORMAL HIGH (ref 70–99)

## 2021-06-13 LAB — COMPREHENSIVE METABOLIC PANEL
ALT: 39 U/L (ref 0–44)
AST: 54 U/L — ABNORMAL HIGH (ref 15–41)
Albumin: 2.8 g/dL — ABNORMAL LOW (ref 3.5–5.0)
Alkaline Phosphatase: 41 U/L (ref 38–126)
Anion gap: 9 (ref 5–15)
BUN: 14 mg/dL (ref 8–23)
CO2: 23 mmol/L (ref 22–32)
Calcium: 8.4 mg/dL — ABNORMAL LOW (ref 8.9–10.3)
Chloride: 108 mmol/L (ref 98–111)
Creatinine, Ser: 0.84 mg/dL (ref 0.44–1.00)
GFR, Estimated: >60 mL/min (ref >60)
Glucose, Bld: 155 mg/dL — ABNORMAL HIGH (ref 70–99)
Potassium: 3.3 mmol/L — ABNORMAL LOW (ref 3.5–5.1)
Sodium: 140 mmol/L (ref 135–145)
Total Bilirubin: 0.3 mg/dL (ref 0.3–1.2)
Total Protein: 6 g/dL — ABNORMAL LOW (ref 6.5–8.1)

## 2021-06-13 LAB — POCT I-STAT 7, (LYTES, BLD GAS, ICA,H+H)
Acid-Base Excess: 0 mmol/L (ref 0.0–2.0)
Bicarbonate: 23.9 mmol/L (ref 20.0–28.0)
Calcium, Ion: 1.22 mmol/L (ref 1.15–1.40)
HCT: 31 % — ABNORMAL LOW (ref 36.0–46.0)
Hemoglobin: 10.5 g/dL — ABNORMAL LOW (ref 12.0–15.0)
O2 Saturation: 97 %
Patient temperature: 101.3
Potassium: 3.1 mmol/L — ABNORMAL LOW (ref 3.5–5.1)
Sodium: 142 mmol/L (ref 135–145)
TCO2: 25 mmol/L (ref 22–32)
pCO2 arterial: 39.6 mmHg (ref 32.0–48.0)
pH, Arterial: 7.396 (ref 7.350–7.450)
pO2, Arterial: 96 mmHg (ref 83.0–108.0)

## 2021-06-13 LAB — CBC
HCT: 38.1 % (ref 36.0–46.0)
Hemoglobin: 12.4 g/dL (ref 12.0–15.0)
MCH: 30.5 pg (ref 26.0–34.0)
MCHC: 32.5 g/dL (ref 30.0–36.0)
MCV: 93.8 fL (ref 80.0–100.0)
Platelets: 293 10*3/uL (ref 150–400)
RBC: 4.06 MIL/uL (ref 3.87–5.11)
RDW: 13.4 % (ref 11.5–15.5)
WBC: 14.5 10*3/uL — ABNORMAL HIGH (ref 4.0–10.5)
nRBC: 0 % (ref 0.0–0.2)

## 2021-06-13 LAB — PHOSPHORUS: Phosphorus: 2.1 mg/dL — ABNORMAL LOW (ref 2.5–4.6)

## 2021-06-13 LAB — TRIGLYCERIDES: Triglycerides: 324 mg/dL — ABNORMAL HIGH (ref <150)

## 2021-06-13 LAB — MAGNESIUM: Magnesium: 2 mg/dL (ref 1.7–2.4)

## 2021-06-13 MED ORDER — GADOBUTROL 1 MMOL/ML IV SOLN
10.0000 mL | Freq: Once | INTRAVENOUS | Status: AC | PRN
  Administered 2021-06-13: 10 mL via INTRAVENOUS

## 2021-06-13 MED ORDER — FENTANYL BOLUS VIA INFUSION
50.0000 ug | INTRAVENOUS | Status: DC | PRN
  Administered 2021-06-14 (×2): 100 ug via INTRAVENOUS
  Filled 2021-06-13: qty 100

## 2021-06-13 MED ORDER — MIDAZOLAM HCL 2 MG/2ML IJ SOLN: 2.0000 mg | Freq: Four times a day (QID) | INTRAMUSCULAR | Status: DC | PRN

## 2021-06-13 MED ORDER — FENTANYL CITRATE PF 50 MCG/ML IJ SOSY: 50.0000 ug | PREFILLED_SYRINGE | Freq: Once | INTRAMUSCULAR | Status: DC

## 2021-06-13 MED ORDER — PROSOURCE TF PO LIQD
45.0000 mL | Freq: Two times a day (BID) | ORAL | Status: DC
  Administered 2021-06-13 – 2021-06-14 (×3): 45 mL
  Filled 2021-06-13 (×3): qty 45

## 2021-06-13 MED ORDER — VITAL HIGH PROTEIN PO LIQD: 1000.0000 mL | ORAL | Status: DC

## 2021-06-13 MED ORDER — POTASSIUM CHLORIDE 20 MEQ PO PACK
20.0000 meq | PACK | Freq: Once | ORAL | Status: AC
  Administered 2021-06-13: 20 meq
  Filled 2021-06-13: qty 1

## 2021-06-13 MED ORDER — DEXMEDETOMIDINE HCL IN NACL 400 MCG/100ML IV SOLN
0.0000 ug/kg/h | INTRAVENOUS | Status: AC
  Administered 2021-06-13: 0.5 ug/kg/h via INTRAVENOUS
  Administered 2021-06-14: 0.3 ug/kg/h via INTRAVENOUS
  Administered 2021-06-14: 1 ug/kg/h via INTRAVENOUS
  Administered 2021-06-14: 0.4 ug/kg/h via INTRAVENOUS
  Administered 2021-06-14 – 2021-06-16 (×10): 1 ug/kg/h via INTRAVENOUS
  Filled 2021-06-13 (×16): qty 100

## 2021-06-13 MED ORDER — POTASSIUM CHLORIDE 20 MEQ PO PACK
40.0000 meq | PACK | Freq: Once | ORAL | Status: AC
Start: 2021-06-13 — End: 2021-06-13
  Administered 2021-06-13: 40 meq
  Filled 2021-06-13: qty 2

## 2021-06-13 MED ORDER — METRONIDAZOLE 500 MG/100ML IV SOLN
500.0000 mg | Freq: Three times a day (TID) | INTRAVENOUS | Status: DC
  Administered 2021-06-13 – 2021-06-19 (×18): 500 mg via INTRAVENOUS
  Filled 2021-06-13 (×19): qty 100

## 2021-06-13 MED ORDER — FENTANYL 2500MCG IN NS 250ML (10MCG/ML) PREMIX INFUSION
50.0000 ug/h | INTRAVENOUS | Status: DC
  Administered 2021-06-13 – 2021-06-14 (×2): 100 ug/h via INTRAVENOUS
  Filled 2021-06-13 (×2): qty 250

## 2021-06-13 MED ORDER — ENOXAPARIN SODIUM 60 MG/0.6ML IJ SOSY
60.0000 mg | PREFILLED_SYRINGE | INTRAMUSCULAR | Status: DC
  Administered 2021-06-13 – 2021-06-19 (×6): 60 mg via SUBCUTANEOUS
  Filled 2021-06-13 (×9): qty 0.6

## 2021-06-13 MED ORDER — ACETAMINOPHEN 160 MG/5ML PO SOLN
650.0000 mg | Freq: Four times a day (QID) | ORAL | Status: DC | PRN
  Administered 2021-06-13: 650 mg
  Filled 2021-06-13: qty 20.3

## 2021-06-13 MED ORDER — VITAL HIGH PROTEIN PO LIQD
1000.0000 mL | ORAL | Status: DC
  Administered 2021-06-14: 1000 mL

## 2021-06-13 NOTE — Progress Notes (Signed)
[place] Progress Note Patient Name: Lisa [name] DOB: May 17, 1961 MRN: [id number]   Date of Service  06/13/2021  HPI/Events of Note  Fever to 102.0 F - Felt to be d/t meningitis. Already on Abx Rx with Vancomycin, Ceftriaxone and Ampicillin. Patient already has cooling blanket, however, temp in not decreasing. AST and ALT are both normal.   eICU Interventions  Plan: Tylenol liquid 650 mg per tube Q 6 hours PRN Temp > 101.0 F.     Intervention Category Major Interventions: Infection - evaluation and management  Breaker Springer [place] 06/13/2021, 1:27 AM

## 2021-06-13 NOTE — Consult Note (Signed)
[facility]    Date of Admission:  06/11/2021     Total days of antibiotics 1  Vancomycin  Ampicillin  Ceftriaxone          Reason for Consult: bacterial meningitis    Referring Provider: Erin Fulling Primary Care Provider: Linward Headland, MD   Assessment: Lisa [name] is a 61 y.o. female admitted with acute onset fevers and AMS. Described a short term viral syndrome about a week prior that had improved up until recently (according to her husband). LP revealed a neutrophilic predominate pleocytosis with WBC > 6,000; glucose 32 and protein 283 all c/w acute bacterial meningitis. Strep pneumo urine antigen negative. So far CSF cultures are preliminarily no growth but in the interim her blood cultures are now growing haemophilus influenzae in 1/4 bottles. Consistent for contiguous meningitis with the MRI findings of severe para sinus disease and mastoiditis. In light of this I think we are OK to continue with high dose BID ceftriaxone alone and will D/C ampicillin and vancomycin.  No benefit for adjunctive steroids with H. Influenzae meningitis in adults.  Can D/C droplet precautions also.    Plan: Stop ampicillin / vancomycin  Continue high dose Ceftriaxone Follow pending micro  D/C droplet precautions   Principal Problem:   Acute bacterial meningitis Active Problems:   Diabetes mellitus, type 2 (HCC)   CSF leak   Acute metabolic encephalopathy   Altered mental status   Pressure injury of skin    chlorhexidine gluconate (MEDLINE KIT)  15 mL Mouth Rinse BID   Chlorhexidine Gluconate Cloth  6 each Topical Daily   docusate  100 mg Per Tube BID   enoxaparin (LOVENOX) injection  40 mg Subcutaneous Q24H   fentaNYL (SUBLIMAZE) injection  50 mcg Intravenous Once   insulin aspart  0-15 Units Subcutaneous Q4H   mouth rinse  15 mL Mouth Rinse 10 times per day   pantoprazole (PROTONIX) IV  40 mg Intravenous Daily   polyethylene glycol  17 g Per  Tube Daily   sodium chloride flush  3 mL Intravenous Q12H    HPI: Lisa [name] is a 61 y.o. female admitted to the hospital from home with altered mental status.   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   She was not able to provide any history during evaluation in ER. According to her husband she had a pretty sudden onset h/a, fever and drainage from her nose described to be "CSF." No meningismus symptoms described in record. Had maybe 1 day of chills and sore throat with bodyaches a week prior that had seemed to resolved on her own.   In ER she underwent LP with 6,425 WBC (84%Neuts, 11%Lymphs); gram stain with abundant WBC no organisms; Protein 283, Glucose 32. Started on Vancomycin, Ceftriaxone, Ampicillin for bacterial meningitis.  Persistently agitated in ER and required intubation for IR LP. MRI brain obtained today 1/5 and revealed diffuse leptomeningeal enhancement bilateral cerebral hemispheres, no abscess findings; extensive paranasal sinus disease with large b/l mastoid effusions.  In the interim her blood cultures are also now growing haemophilis influenzae.    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    Polycystic ovarian disease     Social History   Tobacco Use   Smoking status: Every Day    Packs/day: 1.50    Years: 35.00    Pack years: 52.50    Types: Cigarettes   Smokeless tobacco: Never  Substance Use Topics   Alcohol use: No   Drug use: No    Family History  Problem Relation Age of Onset   Arthritis Mother    Hypertension Mother    Arthritis Father    Prostate cancer Father    Heart disease Father    Marfan syndrome  Father    Hyperlipidemia Sister    Diabetes Sister    Marfan syndrome Sister    Breast cancer Sister    Breast cancer Maternal Grandmother    Hypertension Maternal Grandmother    Diabetes Paternal Grandmother    Allergies  Allergen Reactions   Statins Other (See Comments)    Other reaction(s): Other CSF leaking out nose. Headache   Glimepiride Other (See Comments)    Hypoglicemia    OBJECTIVE: Blood pressure 108/60, pulse 78, temperature 98.7 F (37.1 C), temperature source Esophageal, resp. rate (!) 22, height 5' 6"  (1.676 m), weight 118.5 kg, last menstrual period 11/02/2013, SpO2 97 %.  Physical Exam Vitals and nursing note reviewed.  Constitutional:      Comments: Sedated on vent.   HENT:     Mouth/Throat:     Mouth: No oral lesions.     Dentition: No dental abscesses.  Eyes:     Pupils: Pupils are equal, round, and reactive to light.     Comments: Sluggish pupillary response but equal.   Cardiovascular:     Rate and Rhythm: Normal rate and regular rhythm.     Heart sounds: Normal heart sounds.  Pulmonary:     Effort: Pulmonary effort is normal.     Breath sounds: Normal breath sounds.  Abdominal:     General: There is no distension.     Palpations: Abdomen is soft.     Tenderness: There is no abdominal tenderness.  Musculoskeletal:        General: No tenderness. Normal range of motion.  Lymphadenopathy:     Cervical: No cervical adenopathy.  Skin:    General: Skin is warm and dry.     Findings: No rash.  Neurological:     Mental Status: She is alert.     Comments: Sedated on ventilator. Withdraws to pain. Moves all 4 extremities.     Lab Results Lab Results  Component Value Date   WBC 14.5 (H) 06/13/2021   HGB 12.4 06/13/2021   HCT 38.1 06/13/2021   MCV 93.8 06/13/2021   PLT 293 06/13/2021    Lab Results  Component Value Date   CREATININE 0.84 06/13/2021   BUN 14 06/13/2021   NA 140 06/13/2021   K 3.3 (L) 06/13/2021   CL 108 06/13/2021   CO2  23 06/13/2021    Lab Results  Component Value Date   ALT 39 06/13/2021   AST 54 (H) 06/13/2021   ALKPHOS 41 06/13/2021   BILITOT 0.3 06/13/2021     Microbiology: Recent Results (from the past 240 hour(s))  Culture, blood (Routine x 2)     Status: None (Preliminary result)   Collection Time: 06/11/21  2:23 PM   Specimen: BLOOD  LEFT HAND  Result Value Ref Range Status   Specimen Description BLOOD LEFT HAND  Final   Special Requests   Final    BOTTLES DRAWN AEROBIC ONLY Blood Culture results may not be optimal due to an inadequate volume of blood received in culture bottles   Culture   Final    NO GROWTH 2 DAYS Performed at [facility] [address], [place], [place] [address]    Report Status PENDING  Incomplete  Urine Culture     Status: None   Collection Time: 06/11/21  2:24 PM   Specimen: In/Out Cath Urine  Result Value Ref Range Status   Specimen Description IN/OUT CATH URINE  Final   Special Requests NONE  Final   Culture   Final    NO GROWTH Performed at [facility], [place] [address] [place], [place] [address]    Report Status 06/12/2021 FINAL  Final  Resp Panel by RT-PCR (Flu A&B, Covid) Nasopharyngeal Swab     Status: None   Collection Time: 06/11/21  2:51 PM   Specimen: Nasopharyngeal Swab; Nasopharyngeal(NP) swabs in vial transport medium  Result Value Ref Range Status   SARS Coronavirus 2 by RT PCR NEGATIVE NEGATIVE Final    Comment: (NOTE) SARS-CoV-2 target nucleic acids are NOT DETECTED.  The SARS-CoV-2 RNA is generally detectable in upper respiratory specimens during the acute phase of infection. The lowest concentration of SARS-CoV-2 viral copies this assay can detect is 138 copies/mL. A negative result does not preclude SARS-Cov-2 infection and should not be used as the sole basis for treatment or other patient management decisions. A negative result may occur with  improper specimen collection/handling, submission of specimen other than  nasopharyngeal swab, presence of viral mutation(s) within the areas targeted by this assay, and inadequate number of viral copies(<138 copies/mL). A negative result must be combined with clinical observations, patient history, and epidemiological information. The expected result is Negative.  Fact Sheet for Patients:  EntrepreneurPulse.com.au  Fact Sheet for Healthcare Providers:  IncredibleEmployment.be  This test is no t yet approved or cleared by the [place] FDA and  has been authorized for detection and/or diagnosis of SARS-CoV-2 by FDA under an Emergency Use Authorization (EUA). This EUA will remain  in effect (meaning this test can be used) for the duration of the COVID-19 declaration under Section 564(b)(1) of the Act, 21 U.S.C.section 360bbb-3(b)(1), unless the authorization is terminated  or revoked sooner.       Influenza A by PCR NEGATIVE NEGATIVE Final   Influenza B by PCR NEGATIVE NEGATIVE Final    Comment: (NOTE) The Xpert Xpress SARS-CoV-2/FLU/RSV plus assay is intended as an aid in the diagnosis of influenza from Nasopharyngeal swab specimens and should not be used as a sole basis for treatment. Nasal washings and aspirates are unacceptable for Xpert Xpress SARS-CoV-2/FLU/RSV testing.  Fact Sheet for Patients: EntrepreneurPulse.com.au  Fact Sheet for Healthcare Providers: IncredibleEmployment.be  This test is not yet approved or cleared by the [place] FDA and has been authorized for detection and/or diagnosis of SARS-CoV-2 by FDA under an Emergency Use Authorization (EUA). This EUA will remain in effect (meaning this test can be used) for the duration of the COVID-19 declaration under Section 564(b)(1) of the Act, 21 U.S.C. section 360bbb-3(b)(1), unless the authorization is terminated or revoked.  Performed at [facility], [place] [address], [place],  [address]    Culture, blood (Routine x 2)     Status: Abnormal (  Preliminary result)   Collection Time: 06/11/21  3:00 PM   Specimen: BLOOD RIGHT ARM  Result Value Ref Range Status   Specimen Description BLOOD RIGHT ARM  Final   Special Requests   Final    BOTTLES DRAWN AEROBIC AND ANAEROBIC Blood Culture adequate volume   Culture (A)  Final    HAEMOPHILUS INFLUENZAE BETA LACTAMASE POSITIVE CRITICAL RESULT CALLED TO, READ BACK BY AND VERIFIED WITH: PHARMD J.FRENS AT 9381 ON 06/13/2021 BY T.SAAD. Performed at [facility], [place] [address] [place], [place] [address]    Report Status PENDING  Incomplete  CSF culture w Stat Gram Stain     Status: None (Preliminary result)   Collection Time: 06/12/21  3:40 PM   Specimen: CSF; Cerebrospinal Fluid  Result Value Ref Range Status   Specimen Description CSF  Final   Special Requests Normal  Final   Gram Stain   Final    ABUNDANT WBC PRESENT,BOTH PMN AND MONONUCLEAR NO ORGANISMS SEEN    Culture   Final    NO GROWTH < 24 HOURS Performed at [facility], [place] [address] [place], [place] [address]    Report Status PENDING  Incomplete  MRSA Next Gen by PCR, Nasal     Status: None   Collection Time: 06/12/21  4:54 PM   Specimen: Nasal Mucosa; Nasal Swab  Result Value Ref Range Status   MRSA by PCR Next Gen NOT DETECTED NOT DETECTED Final    Comment: (NOTE) The GeneXpert MRSA Assay (FDA approved for NASAL specimens only), is one component of a comprehensive MRSA colonization surveillance program. It is not intended to diagnose MRSA infection nor to guide or monitor treatment for MRSA infections. Test performance is not FDA approved in patients less than 16 years old. Performed at [facility], [place] [address] [place], [place] [address]     Janene Madeira, MSN, NP-C [facility] Pager: [phone]  06/13/2021 12:00 PM

## 2021-06-13 NOTE — Anesthesia Postprocedure Evaluation (Signed)
Anesthesia Post Note  Patient: Lisa [name]  Procedure(s) Performed: IR WITH ANESTHESIA     Patient location during evaluation: PACU Anesthesia Type: General Level of consciousness: awake and alert Pain management: pain level controlled Vital Signs Assessment: post-procedure vital signs reviewed and stable Respiratory status: spontaneous breathing, nonlabored ventilation, respiratory function stable and patient connected to nasal cannula oxygen Cardiovascular status: blood pressure returned to baseline and stable Postop Assessment: no apparent nausea or vomiting Anesthetic complications: no   No notable events documented.  Last Vitals:  Vitals:   06/13/21 0545 06/13/21 0600  BP: 111/66 114/63  Pulse: 90 89  Resp: (!) 22 (!) 22  Temp: 37.7 C   SpO2: 100% 99%    Last Pain:  Vitals:   06/13/21 0545  TempSrc: Esophageal                 Elsie Baynes

## 2021-06-13 NOTE — Progress Notes (Signed)
PHARMACY - PHYSICIAN COMMUNICATION CRITICAL VALUE ALERT - BLOOD CULTURE IDENTIFICATION (BCID)  Lisa [name] is an 61 y.o. female who presented to [facility] on 06/11/2021 with concern for meningitis.    Assessment:  61 year old woman being treated for meningitis.  CSF analysis consistent, when elevated protein, wbc and low glucose.  Blood culture positive for HFlu, beta-lactamase positive  Name of physician (or Provider) Contacted: Andres Labrum, NP and Hollie Salk MD  Current antibiotics: Vancomycin, ampicillin and ceftriaxone  Changes to prescribed antibiotics recommended: narrow to ceftriaxone monotherapy. Orders received.    Lisa [name] 06/13/2021  11:26 AM

## 2021-06-13 NOTE — Progress Notes (Signed)
Brief Nutrition Note  Consult received for enteral/tube feeding initiation and management.  Adult Enteral Nutrition Protocol initiated. Full assessment to follow.  Admitting Dx: Altered mental status [R41.82] Fever [R50.9] Acute encephalopathy [G93.40] Sepsis ([place]) [A41.9] AMS (altered mental status) [R41.82] Sepsis, due to unspecified organism, unspecified whether acute organ dysfunction present ([place]) [A41.9]  Body mass index is 42.17 kg/m. Pt meets criteria for morbid obesity based on current BMI.  Labs:  Recent Labs  Lab 06/11/21 1423 06/12/21 0040 06/12/21 1722 06/13/21 0332 06/13/21 0701  NA 133* 134* 142 142 140  K 3.6 2.9* 3.3* 3.1* 3.3*  CL 99 104  --   --  108  CO2 23 21*  --   --  23  BUN 10 13  --   --  14  CREATININE 0.96 0.89  --   --  0.84  CALCIUM 8.7* 8.4*  --   --  8.4*  GLUCOSE 210* 205*  --   --  155*    Ashaunti Treptow P., RD, LDN, CNSC See AMiON for contact information

## 2021-06-13 NOTE — Procedures (Signed)
Patient Name: Lisa [name]  MRN: [id number]  Epilepsy Attending: Lora Havens  Referring Physician/Provider: Dr Donnetta Simpers Date: 06/13/2021 Duration: 23.43 mins  Patient history: 61 y.o. 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 EEG to evaluate for seizure  Level of alertness:  lethargic   AEDs during EEG study: Propofol  Technical aspects: This EEG study was done with scalp electrodes positioned according to the 10-20 International system of electrode placement. Electrical activity was acquired at a sampling rate of 500Hz  and reviewed with a high frequency filter of 70Hz  and a low frequency filter of 1Hz . EEG data were recorded continuously and digitally stored.   Description: EEG showed continuous generalized predominantly 5 to 6 Hz theta slowing admixed with intermittent generalized 2 to 3 Hz delta slowing. Hyperventilation and photic stimulation were not performed.     ABNORMALITY - Continuous slow, generalized  IMPRESSION: This study is suggestive of moderate diffuse encephalopathy, nonspecific etiology. No seizures or epileptiform discharges were seen throughout the recording.  Tosca Pletz Barbra Sarks

## 2021-06-13 NOTE — Progress Notes (Addendum)
NAME:  Lisa [name], MRN:  [id number], DOB:  August 26, 1960, LOS: 2 ADMISSION DATE:  06/11/2021, CONSULTATION DATE:  06/12/20 REFERRING MD:  Collene Gobble - IMTS, CHIEF COMPLAINT:  Altered mental status // Reason for consult: ventilator management   History of Present Illness:  61 yo F PMH temporal bone abnormality with associated intermitted CSF leak presented to [facility] 1/3 with AMS and fever. Intermittent CSF leak x 15 years, associated HA for about 2 days after each CSF leak episode. LP was attempted x3 in ED without success. Admitted to IMTS for suspected bacterial meningitis. She was started on vanc, rocephin, ampicillin. he also exhibited agitated delirium and was given Haldol and zyprexa.  On 1/4 the patient went to IR for LP and required intubation for the procedure. She remains intubated after the procedure. LP yielded 58m cloudy CSF fluid which was sent for analysis   PCCM is consulted in this setting   Admitting labs reveal leukocytosis WBC 14.2 remaining CBC WNL. Metabolic abnormalities include Na 133 Glu 210 Ca 8.7  Pertinent  Medical History  Temporal bone abnormality CSF leaks HTN HLD DM2 Chronic pain  Rectocele  Depression  Sleep apnea Hepatic steatosis   Significant Hospital Events: Including procedures, antibiotic start and stop dates in addition to other pertinent events   1/3 admitted to IMTS for suspected bacterial meningitis. LP attempt x3 in ED without success. Haldol, zyprexa for agitation 1/4 LP with IR, required intubation for procedure. Remains intubated, CCM consulted  1/5 LP consistent with bacterial meningitis MRI brain Diffuse leptomeningeal enhancement involving both cerebral hemispheres, highly suggestive of acute meningitis. Extensive paranasal sinus disease with large bilateral mastoid While these findings are of unknown acuity, possible acute sinusitis and/or mastoiditis could be present, and could potentially serve as a source for seeding of CNS  infection  Interim History / Subjective:  No acute issues overnight, remains sedated on ventilator  Objective   Blood pressure 119/60, pulse (!) 109, temperature 99.1 F (37.3 C), temperature source Esophageal, resp. rate (!) 29, height 5' 6"  (1.676 m), weight 118.5 kg, last menstrual period 11/02/2013, SpO2 98 %.    Vent Mode: PRVC FiO2 (%):  [40 %-100 %] 40 % Set Rate:  [18 bmp-22 bmp] 22 bmp Vt Set:  [470 mL] 470 mL PEEP:  [5 cmH20] 5 cmH20 Pressure Support:  [10 cmH20] 10 cmH20 Plateau Pressure:  [18 cmH20-20 cmH20] 18 cmH20   Intake/Output Summary (Last 24 hours) at 06/13/2021 [id number] data filed at 06/13/2021 0740 Gross per 24 hour  Intake 3794.52 ml  Output 1596 ml  Net 2198.52 ml    Filed Weights   06/11/21 1422 06/12/21 1644  Weight: 124.7 kg 118.5 kg    Examination: General: Acute on chronic ill-appearing middle-aged female lying in bed in no acute distress HEENT: ETT, MM pink/moist, PERRL,  Neuro: Sedated on ventilator, will open eyes to verbal stimuli CV: s1s2 regular rate and rhythm, no murmur, rubs, or gallops,  PULM: Clear to auscultation bilaterally, no increased work of breathing, no added breath sounds, tolerating ventilator GI: soft, bowel sounds active in all 4 quadrants, non-tender, non-distended Extremities: warm/dry, no edema  Skin: no rashes or lesions   Resolved Hospital Problem list   Hyponatremia   Assessment & Plan:  Severe sepsis due to suspected bacterial meningitis  -With an acute concern for bacterial meningitis with history of CSF leak  -On arrival patient was seen febrile with temp 101.3, tachypnic with RR 30, tachycardic with HR 115-120 with associated leukocytosis (  WBC 14.3), Developing elevated lactic acid of 2.2 (1.3 on admit) and acute AMS (developed while in ED) P: Remains critically ill in the ICU Continue IV ampicillin, ceftriaxone, and vancomycin: Vent support as below Follow CSF culture repeat lactic acid to ensure  clearance IV antibiotics vanc and Zosyn usually Monitor urine output As needed antipyretics/cooling blanket  Bacterial meningitis in the setting of known CSF leak  Acute metabolic encephalopathy  -Per family patient developed worsened headache the night of admission with CSF leak  -LP completed per IR 1/4 WBC 6004 and 25, glucose 22, total protein 283 consistent with bacterial meningitis P: Neurology consulted, appreciate assistance Given mastoiditis and persistent CSF leak, infectious diseases also been consulted Maintain neuro protective measures; goal for eurothermia, euglycemia, eunatermia, normoxia, and PCO2 goal of 35-40 Nutrition and bowel regiment  Seizure precautions  Antibiotics as above Follow cultures Aspirations precautions   Expected ventilator management post procedure  -Patient was intubated for LP given severe combativeness and confusion -She remained intubated post procedure  P: Continue ventilator support with lung protective strategies  Wean PEEP and FiO2 for sats greater than 90%. Head of bed elevated 30 degrees. Plateau pressures less than 30 cm H20.  Follow intermittent chest x-ray and ABG.   SAT/SBT as tolerated, mentation preclude extubation  Ensure adequate pulmonary hygiene  Follow cultures  VAP bundle in place  PAD protocol  Hypokalemia  -s/p 6 runs of K 1/4 P: Trend to be met Supplement as needed  Hx of depression  -Home medications includes Ambien, Wellbutrin, Zoloft,  P: Home medication remains on hold Hx of type 2 diabetes -Home medications include Victoza, metformin, and lantus  P: Continue SSI CBG checks every 4 hours CBG goal 140-180 Can resume home long-acting insulin if needed  Hx of HTN/HLD -Home medications include ASA, Lisinopril,  P: Continuous telemetry Monitor volume status Resume home medications when appropriate  Pressure Injury 06/12/21 Sacrum Mid Stage 1, present on admission  - monitor, wound are consult if  needed  Best Practice (right click and "Reselect all SmartList Selections" daily)   Diet/type: NPO DVT prophylaxis: LMWH GI prophylaxis: PPI Lines: N/A Foley:  N/A Code Status:  full code Last date of multidisciplinary goals of care discussion: Pending   Critical care time:     CRITICAL CARE Performed by: Whitney D. Harris   Total critical care time: 38 minutes  Critical care time was exclusive of separately billable procedures and treating other patients. Critical care was necessary to treat or prevent imminent or life-threatening deterioration.  Critical care was time spent personally by me on the following activities: development of treatment plan with patient and/or surrogate as well as nursing, discussions with consultants, evaluation of patient's response to treatment, examination of patient, obtaining history from patient or surrogate, ordering and performing treatments and interventions, ordering and review of laboratory studies, ordering and review of radiographic studies, pulse oximetry and re-evaluation of patient's condition.  Eliseo Gum MSN, AGACNP-BC [place] for pager  06/13/2021, 8:29 AM

## 2021-06-13 NOTE — Progress Notes (Addendum)
Error

## 2021-06-13 NOTE — Progress Notes (Signed)
Patient transported from 4N25 to MRI and back with no complications noted.

## 2021-06-13 NOTE — Progress Notes (Signed)
Pre-WUA patient RASS +2 and not following commands. Propofol decreased by 50%, on 15/hr propofol patient RASS 3+, RR 28-35, HR 120-140s, asynchronous with ventilator, and still not following commands. Propofol resumed to 30/hr. HR back to 90s-100s.  Montez Hageman, RN

## 2021-06-14 ENCOUNTER — Inpatient Hospital Stay (HOSPITAL_COMMUNITY): Payer: Commercial Managed Care - HMO

## 2021-06-14 DIAGNOSIS — G9341 Metabolic encephalopathy: Secondary | ICD-10-CM | POA: Diagnosis not present

## 2021-06-14 DIAGNOSIS — G009 Bacterial meningitis, unspecified: Secondary | ICD-10-CM

## 2021-06-14 DIAGNOSIS — R509 Fever, unspecified: Secondary | ICD-10-CM

## 2021-06-14 DIAGNOSIS — R531 Weakness: Secondary | ICD-10-CM

## 2021-06-14 DIAGNOSIS — A413 Sepsis due to Hemophilus influenzae: Secondary | ICD-10-CM | POA: Diagnosis not present

## 2021-06-14 DIAGNOSIS — G9600 Cerebrospinal fluid leak, unspecified: Secondary | ICD-10-CM | POA: Diagnosis not present

## 2021-06-14 LAB — GLUCOSE, CAPILLARY
Glucose-Capillary: 142 mg/dL — ABNORMAL HIGH (ref 70–99)
Glucose-Capillary: 144 mg/dL — ABNORMAL HIGH (ref 70–99)
Glucose-Capillary: 130 mg/dL — ABNORMAL HIGH (ref 70–99)
Glucose-Capillary: 111 mg/dL — ABNORMAL HIGH (ref 70–99)
Glucose-Capillary: 166 mg/dL — ABNORMAL HIGH (ref 70–99)
Glucose-Capillary: 161 mg/dL — ABNORMAL HIGH (ref 70–99)

## 2021-06-14 LAB — POCT I-STAT 7, (LYTES, BLD GAS, ICA,H+H)
Acid-Base Excess: 0 mmol/L (ref 0.0–2.0)
Bicarbonate: 24.1 mmol/L (ref 20.0–28.0)
Calcium, Ion: 1.21 mmol/L (ref 1.15–1.40)
HCT: 32 % — ABNORMAL LOW (ref 36.0–46.0)
Hemoglobin: 10.9 g/dL — ABNORMAL LOW (ref 12.0–15.0)
O2 Saturation: 94 %
Patient temperature: 99.1
Potassium: 3.3 mmol/L — ABNORMAL LOW (ref 3.5–5.1)
Sodium: 144 mmol/L (ref 135–145)
TCO2: 25 mmol/L (ref 22–32)
pCO2 arterial: 35.9 mmHg (ref 32.0–48.0)
pH, Arterial: 7.437 (ref 7.350–7.450)
pO2, Arterial: 67 mmHg — ABNORMAL LOW (ref 83.0–108.0)

## 2021-06-14 LAB — BASIC METABOLIC PANEL
Anion gap: 7 (ref 5–15)
BUN: 21 mg/dL (ref 8–23)
CO2: 26 mmol/L (ref 22–32)
Calcium: 8.2 mg/dL — ABNORMAL LOW (ref 8.9–10.3)
Chloride: 108 mmol/L (ref 98–111)
Creatinine, Ser: 0.73 mg/dL (ref 0.44–1.00)
GFR, Estimated: >60 mL/min (ref >60)
Glucose, Bld: 140 mg/dL — ABNORMAL HIGH (ref 70–99)
Potassium: 3.7 mmol/L (ref 3.5–5.1)
Sodium: 141 mmol/L (ref 135–145)

## 2021-06-14 LAB — MAGNESIUM
Magnesium: 2 mg/dL (ref 1.7–2.4)
Magnesium: 1.9 mg/dL (ref 1.7–2.4)

## 2021-06-14 LAB — PHOSPHORUS
Phosphorus: 2.2 mg/dL — ABNORMAL LOW (ref 2.5–4.6)
Phosphorus: 2.8 mg/dL (ref 2.5–4.6)

## 2021-06-14 LAB — VDRL, CSF: VDRL Quant, CSF: NONREACTIVE

## 2021-06-14 MED ORDER — ACETAMINOPHEN 325 MG PO TABS
650.0000 mg | ORAL_TABLET | Freq: Four times a day (QID) | ORAL | Status: DC | PRN
  Administered 2021-06-16: 650 mg via ORAL
  Filled 2021-06-14 (×2): qty 2

## 2021-06-14 MED ORDER — PANTOPRAZOLE 2 MG/ML SUSPENSION: 40.0000 mg | Freq: Every day | ORAL | Status: DC

## 2021-06-14 MED ORDER — ATROPINE SULFATE 1 MG/10ML IJ SOSY
PREFILLED_SYRINGE | INTRAMUSCULAR | Status: AC
  Filled 2021-06-14: qty 10

## 2021-06-14 MED ORDER — SERTRALINE HCL 50 MG PO TABS
100.0000 mg | ORAL_TABLET | Freq: Every day | ORAL | Status: DC
  Administered 2021-06-14 – 2021-06-16 (×2): 100 mg
  Filled 2021-06-14 (×2): qty 2

## 2021-06-14 MED ORDER — SODIUM PHOSPHATES 45 MMOLE/15ML IV SOLN
20.0000 mmol | Freq: Once | INTRAVENOUS | Status: AC
  Administered 2021-06-14: 20 mmol via INTRAVENOUS
  Filled 2021-06-14: qty 6.67

## 2021-06-14 MED ORDER — ASPIRIN 81 MG PO CHEW
81.0000 mg | CHEWABLE_TABLET | Freq: Every day | ORAL | Status: DC
  Administered 2021-06-14 – 2021-06-16 (×2): 81 mg
  Filled 2021-06-14 (×2): qty 1

## 2021-06-14 MED ORDER — ACETAMINOPHEN 650 MG RE SUPP: 650.0000 mg | Freq: Four times a day (QID) | RECTAL | Status: DC | PRN

## 2021-06-14 NOTE — Progress Notes (Addendum)
SLP Cancellation Note  Patient Details Name: Lisa [name] MRN: [id number] DOB: [birth year]   Cancelled treatment:       Reason Eval/Treat Not Completed: Medical issues which prohibited therapy  Pt was extubated at 1046 this morning, then later exhibited right side weakness. RN reports MRI pending, and requests hold on swallow evaluation until tomorrow. Will continue efforts.  Martel Galvan B. Quentin Ore, [facility], [place] Speech Language Pathologist Office: [phone]  Shonna Chock 06/14/2021, 2:18 PM

## 2021-06-14 NOTE — Progress Notes (Signed)
About 10 minutes into MRI patient began screaming to get out of scanner, banging on the MRI machine with her fists, and ripping off nasal cannula and all heart monitoring leads. Patient was on precedex gtt for MRI. Attempted to redirect patient many times, patient continued to scream saying "I can't do this, I can't do this, take me out now". Patient promptly removed from MRI machine. Patient then refused for any leads or monitoring cords to be placed back on her until we got back to the room around 1545. Precedex gtt also stopped at this time. Dr Quinn Axe (neuro) and Mick Sell PA-C (CCM) both notified.   Dr Quinn Axe told me to hold off on MRI since it is not an immediate need. We will attempt to get a MRI when patient is more cooperative.  When patient arrived back to the unit and we were able to put the leads back on her, we saw that patient's SpO2 was 83% and RR 35-40. Patient needing oxygen mask at 10L. Mick Sell PA-C notified. Verbal order from PA-C to restart precedex gtt at this time.   Montez Hageman, RN

## 2021-06-14 NOTE — Procedures (Signed)
Extubation Procedure Note  Patient Details:   Name: Lisa [name] DOB: Jan 07, 1961 MRN: [id number]   Airway Documentation:    Vent end date: 06/14/21 Vent end time: 1046   Evaluation  O2 sats: stable throughout Complications: No apparent complications Patient did tolerate procedure well. Bilateral Breath Sounds: Clear, Diminished   Yes  Pt extubated per MD order. Placed on 4L [place] at this time. Positive cuff leak noted, no stridor heard. Vitals stable throughout. RT will continue to monitor.   Tobi Bastos 06/14/2021, 10:53 AM

## 2021-06-14 NOTE — Progress Notes (Addendum)
Pt only able to get through AX DWI and COR DWI sequences for ordered MRI before refusing to continue. Pt began screaming to get out of the scanner, raising up arms, legs, grabbing at head coil required for imaging and punching inside of the scanner. Pt removed from scanner and transferred back to bed to be sent back to room. Best possible images obtained. Pt medicated prior to exam by RN. Order changed to WO to reflect pt's inability to reach contrasted portion of exam. Exam completed as a limited study.

## 2021-06-14 NOTE — Progress Notes (Signed)
During my most recent neuro assessment, I noticed that this patient had R sided weakness which I thought was strange due to her having meningitis which is typically bilateral symptoms. Her R grip and R plantar flexion are much weaker than her L and she struggled to lift up her RUE and RLE. She claims she has had no history of one sided weakness. Mick Sell, PA-C notified and assessed patient with me at bedside. Verbal order to page Dr. Su Monks (neuro MD) on new neuro assessment findings. Will follow up with further orders.  Montez Hageman, RN

## 2021-06-14 NOTE — Plan of Care (Signed)
Neurology results review  MRI brain wwo  1. Diffuse leptomeningeal enhancement involving both cerebral hemispheres, highly suggestive of acute meningitis. No complicating features. 2. Extensive paranasal sinus disease with large bilateral mastoid effusions. While these findings are of unknown acuity, possible acute sinusitis and/or mastoiditis could be present, and could potentially serve as a source for seeding of CNS infection.  CNS imaging personally reviewed; I agree with above interpretation  rEEG Continuous generalized slowing, no seizures  ID consulted for assistance with abx mgmt in the setting of mastoiditis and persistent CSF leak; further medical mgmt per their recommendations. Neurology will sign off, but please re-engage if additional neurologic concerns arise.  Lisa Monks, MD [facility] [phone]  If 7pm- 7am, please page neurology on call as listed in [place].

## 2021-06-14 NOTE — Progress Notes (Addendum)
Neurology progress note  S: Patient extubated today and at that time was noted to have mild R sided weakness therefore neurology was reconsulted. On my examination, drift in RUE and RLE, dysarthria, and facial droop for NIHSS = 4. LKW 24+ hrs ago prior to intubation and sedation.   O:   Vitals:   06/14/21 1700 06/14/21 1800  BP: (!) 149/75 (!) 154/69  Pulse: 83 76  Resp: (!) 33 (!) 29  Temp:    SpO2: 95% 95%    Physical Exam Gen: A&Ox4, NAD HEENT: Atraumatic, normocephalic; oropharynx clear, tongue without atrophy or fasciculations. Resp: CTAB, normal work of breathing CV: RRR, extremities appear well-perfused. Abd: soft/NT/ND Extrem: Nml bulk; no cyanosis, clubbing, or edema.  Neuro: *MS: A&O x4. Follows multi-step commands.  *Speech: mild dysarthria, no aphasia *CN:    I: Deferred   II,III: PERRLA, VFF by confrontation, optic discs not visualized 2/2 pupillary constriction   III,IV,VI: EOMI w/o nystagmus, no ptosis   V: Sensation intact from V1 to V3 to LT   VII: Eyelid closure was full.  R UMN facial droop   VIII: Hearing intact to voice   IX,X: Voice normal, palate elevates symmetrically    XI: SCM/trap 5/5 bilat   XII: Tongue protrudes midline, no atrophy or fasciculations  *Motor:   Normal bulk.  No tremor, rigidity or bradykinesia. LUE and LLE full strength throughout. RUE and RLE drift but not to bed. *Sensory: Intact to light touch, pinprick, temperature vibration throughout. Symmetric. Propioception intact bilat.  No double-simultaneous extinction.  *Coordination:  FNF intact on L *Reflexes:  2+ and symmetric throughout without clonus; toes down-going bilat *Gait: deferred  drift in RUE and RLE, dysarthria, and facial droop for NIHSS = 4.   mRS = 1  MRI brain wwo 06/12/21  1. Diffuse leptomeningeal enhancement involving both cerebral hemispheres, highly suggestive of acute meningitis. No complicating features. 2. Extensive paranasal sinus disease with large  bilateral mastoid effusions. While these findings are of unknown acuity, possible acute sinusitis and/or mastoiditis could be present, and could potentially serve as a source for seeding of CNS infection.  CNS imaging personally reviewed  A/P: 61 yo woman with chronic CSF leak admitted with encephalopathy and fevers and found to have CSF c/w bacterial meningitis and imaging c/f mastoiditis. ID consulted; patient is currently on ceftriaxone and flagyl. Patient extubated today and at that time was noted to have mild R sided weakness therefore neurology was reconsulted. On my examination, drift in RUE and RLE, dysarthria, and facial droop for NIHSS = 4. LKW 24+ hrs ago prior to intubation and sedation. MRI brain approx 30 hrs prior showed no e/o acute infarct or abscess on contrasted sequences. Exam not c/w LVO therefore stroke code not activated. Repeat MRI brain wwo contrast was attempted but patient was unable to tolerate 2/2 panic, procedure was aborted. She subsequently was found to have SpO2 83% and RR 35-40. She was placed on NRB at 10L and restarted on precedex. I am concerned that she may have either had a stroke (potentially septic emboli given her bacteremia) or may be developing a brain abscess. MRI brain wwo will be deferred until tomorrow when patient is able to tolerate.  - Aspirin 81mg  daily - Add plavix 75mg  daily - MRI brain with and without contrast when patient able to tolerate - If MRI unrevealing, will perform EEG - Abx per ID - Will continue to follow.  Su Monks, MD [facility] [phone]  If 7pm- 7am, please  page neurology on call as listed in [place].

## 2021-06-14 NOTE — Progress Notes (Signed)
NAME:  Lisa [name], MRN:  [id number], DOB:  February 16, 1961, LOS: 3 ADMISSION DATE:  06/11/2021, CONSULTATION DATE:  06/12/20 REFERRING MD:  Collene Gobble - IMTS, CHIEF COMPLAINT:  Altered mental status // Reason for consult: ventilator management   History of Present Illness:  61 yo F PMH temporal bone abnormality with associated intermitted CSF leak presented to [facility] 1/3 with AMS and fever. Intermittent CSF leak x 15 years, associated HA for about 2 days after each CSF leak episode. LP was attempted x3 in ED without success. Admitted to IMTS for suspected bacterial meningitis. She was started on vanc, rocephin, ampicillin. he also exhibited agitated delirium and was given Haldol and zyprexa.  On 1/4 the patient went to IR for LP and required intubation for the procedure. She remains intubated after the procedure. LP yielded 26ml cloudy CSF fluid which was sent for analysis   PCCM is consulted in this setting   Admitting labs reveal leukocytosis WBC 14.2 remaining CBC WNL. Metabolic abnormalities include Na 133 Glu 210 Ca 8.7  Pertinent  Medical History  Temporal bone abnormality CSF leaks HTN HLD DM2 Chronic pain  Rectocele  Depression  Sleep apnea Hepatic steatosis   Significant Hospital Events: Including procedures, antibiotic start and stop dates in addition to other pertinent events   1/3 admitted to IMTS for suspected bacterial meningitis. LP attempt x3 in ED without success. Haldol, zyprexa for agitation 1/4 LP with IR, required intubation for procedure. Remains intubated, CCM consulted  1/5 LP consistent with bacterial meningitis: MRI brain Diffuse leptomeningeal enhancement involving both cerebral hemispheres, highly suggestive of acute meningitis. Extensive paranasal sinus disease with large bilateral mastoid While these findings are of unknown acuity, possible acute sinusitis and/or mastoiditis could be present, and could potentially serve as a source for seeding of CNS  infection  Interim History / Subjective:  Remains on mech vent: on SBT On precedex and fentanyl for sedation Following commands; AO; MAE  Objective   Blood pressure 128/69, pulse (!) 59, temperature 99 F (37.2 C), temperature source Esophageal, resp. rate 20, height 5\' 6"  (1.676 m), weight 117.3 kg, last menstrual period 11/02/2013, SpO2 95 %.    Vent Mode: PSV;CPAP FiO2 (%):  [40 %] 40 % Set Rate:  [2 bmp-22 bmp] 2 bmp Vt Set:  [470 mL] 470 mL PEEP:  [5 cmH20] 5 cmH20 Pressure Support:  [12 cmH20] 12 cmH20 Plateau Pressure:  [20 cmH20-23 cmH20] 20 cmH20   Intake/Output Summary (Last 24 hours) at 06/14/2021 0919 Last data filed at 06/14/2021 2202 Gross per 24 hour  Intake 1856.22 ml  Output 1055 ml  Net 801.22 ml    Filed Weights   06/11/21 1422 06/12/21 1644 06/14/21 0339  Weight: 124.7 kg 118.5 kg 117.3 kg    Examination: General:  critically ill appearing on mech vent HEENT: MM pink/moist; ETT in place Neuro: Aox3; MAE; PERRL CV: s1s2, no m/r/g PULM:  dim clear BS bilaterally; on mech vent PRVC GI: soft, bsx4 active  Extremities: warm/dry, no edema  Skin: no rashes or lesions    Resolved Hospital Problem list   Hyponatremia   Assessment & Plan:  Severe sepsis due to suspected bacterial meningitis:  H influenza bacteremia -With an acute concern for bacterial meningitis with history of CSF leak  -On arrival patient was seen febrile with temp 101.3, tachypnic with RR 30, tachycardic with HR 115-120 with associated leukocytosis (WBC 14.3), Developing elevated lactic acid of 2.2 (1.3 on admit) and acute AMS (developed while in ED)  P: -ID and neuro following: appreciate recs -continue rocephin for H influenza and flagyl for possible mastoiditis -trend fever/wbc curve -follow cultures -prn tylenol and cooling blanket for fever  Bacterial meningitis in the setting of known CSF leak  Acute metabolic encephalopathy  -Per family patient developed worsened headache the  night of admission with CSF leak  -LP completed per IR 1/4 WBC 6004 and 25, glucose 22, total protein 283 consistent with bacterial meningitis P: -neuro and ID following -continue abx as above -frequent neuro checks; limit sedating medications -Maintain neuro protective measures; goal for eurothermia, euglycemia, eunatermia, normoxia, and PCO2 goal of 35-40 -follow cultures -seizure precautions in place  Expected ventilator management post procedure  -Patient was intubated for LP given severe combativeness and confusion -She remained intubated post procedure  P: -continue prvc 6-8 cc/kg -wean fio2 for sats >90% -vap prevention in place -will do SBT and consider extubation today -wean sedation for RASS 0 to -1  Hypokalemia  -s/p 6 runs of K 1/4 P: -Trend to bmp -replete as needed  Hx of depression  -Home medications includes Ambien, Wellbutrin, Zoloft,  P: -resume home zoloft  Hx of type 2 diabetes -Home medications include Victoza, metformin, and lantus  P: -SSI and cbg monitoring  Hx of HTN/HLD -Home medications include ASA, Lisinopril,  P: -Continuous telemetry -resume home ASA -normotensive now; hold home bp meds  Pressure Injury 06/12/21 Sacrum Mid Stage 1, present on admission  P: - monitor, wound are consult if needed  Best Practice (right click and "Reselect all SmartList Selections" daily)   Diet/type: NPO DVT prophylaxis: LMWH GI prophylaxis: PPI Lines: N/A Foley:  N/A Code Status:  full code Last date of multidisciplinary goals of care discussion: 1/6 spoke with husband at bedside   Critical care time: 35 minutes    JD Rexene Agent [facility] 06/14/2021, 9:40 AM  Please see [email or web address] for pager details.  From 7A-7P if no response, please call [phone]. After hours, please call ELink [phone].

## 2021-06-14 NOTE — Progress Notes (Signed)
[facility] for Infectious Disease  Date of Admission:  06/11/2021      Total days of antibiotics 4  Ceftriaxone 1/03 >> current           ASSESSMENT: Lisa [name] is a 61 y.o. female with bacterial meningitis with secondary bacteremia due to haemophilis influenzae (+beta lactamase). She has improved clinically and extubated today. Will continue with Ceftriaxone BID. She will probably need neurosurgery evaluation now if the CSF leak continues to be persistent.   After our assessment realized that she demonstrated subtle right sided weakness of both upper and lower extremities. MRI pending at the time of this note to evaluate.     PLAN: Continue IV ceftriaxone BID Follow MRI report    Principal Problem:   Acute bacterial meningitis Active Problems:   Diabetes mellitus, type 2 (HCC)   CSF leak   Acute metabolic encephalopathy   Altered mental status   Pressure injury of skin   Sepsis due to Haemophilus influenzae with acute hypoxic respiratory failure and septic shock (HCC)   Fever    aspirin  81 mg Per Tube Daily   Chlorhexidine Gluconate Cloth  6 each Topical Daily   docusate  100 mg Per Tube BID   enoxaparin (LOVENOX) injection  60 mg Subcutaneous Q24H   feeding supplement (PROSource TF)  45 mL Per Tube BID   insulin aspart  0-15 Units Subcutaneous Q4H   pantoprazole sodium  40 mg Per Tube QHS   polyethylene glycol  17 g Per Tube Daily   sertraline  100 mg Per Tube Daily   sodium chloride flush  3 mL Intravenous Q12H    SUBJECTIVE: Extubated - complaining of severe low back pain with known disk problems. Her husband is at her bedside and tell us that the CSF leak from her nose happens every once in a while then usually will seal off and not have a problem. Over 14+ years she has not had any episodes of meningitis.   She also states she is having trouble hearing and her ears hurt a great deal.   She has been afebrile since started on antibiotics.     Review of Systems: Review of Systems  Constitutional:  Negative for chills and fever.  HENT:  Positive for ear pain and hearing loss.   Musculoskeletal:  Positive for back pain.   Allergies  Allergen Reactions   Statins Other (See Comments)    Other reaction(s): Other CSF leaking out nose. Headache   Glimepiride Other (See Comments)    Hypoglicemia    OBJECTIVE: Vitals:   06/14/21 1052 06/14/21 1100 06/14/21 1200 06/14/21 1300  BP: (!) 147/76 (!) 151/72 136/83 136/84  Pulse: 87 79 78 82  Resp: (!) 22 (!) 29 (!) 27 (!) 24  Temp:   99.1 F (37.3 C)   TempSrc:   Axillary   SpO2: 94% 93% 92% 93%  Weight:      Height:       Body mass index is 41.74 kg/m.  Physical Exam Vitals reviewed.  HENT:     Mouth/Throat:     Mouth: Mucous membranes are dry.  Eyes:     Pupils: Pupils are equal, round, and reactive to light.  Cardiovascular:     Rate and Rhythm: Normal rate and regular rhythm.  Pulmonary:     Effort: Pulmonary effort is normal.     Breath sounds: Normal breath sounds.  Abdominal:  General: There is no distension.     Palpations: Abdomen is soft.  Skin:    General: Skin is warm and dry.  Neurological:     Mental Status: She is alert and oriented to person, place, and time.    Lab Results Lab Results  Component Value Date   WBC 14.5 (H) 06/13/2021   HGB 12.4 06/13/2021   HCT 38.1 06/13/2021   MCV 93.8 06/13/2021   PLT 293 06/13/2021    Lab Results  Component Value Date   CREATININE 0.73 06/14/2021   BUN 21 06/14/2021   NA 141 06/14/2021   K 3.7 06/14/2021   CL 108 06/14/2021   CO2 26 06/14/2021    Lab Results  Component Value Date   ALT 39 06/13/2021   AST 54 (H) 06/13/2021   ALKPHOS 41 06/13/2021   BILITOT 0.3 06/13/2021     Microbiology: Recent Results (from the past 240 hour(s))  Culture, blood (Routine x 2)     Status: None (Preliminary result)   Collection Time: 06/11/21  2:23 PM   Specimen: BLOOD LEFT HAND  Result Value  Ref Range Status   Specimen Description BLOOD LEFT HAND  Final   Special Requests   Final    BOTTLES DRAWN AEROBIC ONLY Blood Culture results may not be optimal due to an inadequate volume of blood received in culture bottles   Culture   Final    NO GROWTH 3 DAYS Performed at [facility], [place] [address] [place], [place] [address]    Report Status PENDING  Incomplete  Urine Culture     Status: None   Collection Time: 06/11/21  2:24 PM   Specimen: In/Out Cath Urine  Result Value Ref Range Status   Specimen Description IN/OUT CATH URINE  Final   Special Requests NONE  Final   Culture   Final    NO GROWTH Performed at [facility], [place] [address], [place], [place] [address]    Report Status 06/12/2021 FINAL  Final  Resp Panel by RT-PCR (Flu A&B, Covid) Nasopharyngeal Swab     Status: None   Collection Time: 06/11/21  2:51 PM   Specimen: Nasopharyngeal Swab; Nasopharyngeal(NP) swabs in vial transport medium  Result Value Ref Range Status   SARS Coronavirus 2 by RT PCR NEGATIVE NEGATIVE Final    Comment: (NOTE) SARS-CoV-2 target nucleic acids are NOT DETECTED.  The SARS-CoV-2 RNA is generally detectable in upper respiratory specimens during the acute phase of infection. The lowest concentration of SARS-CoV-2 viral copies this assay can detect is 138 copies/mL. A negative result does not preclude SARS-Cov-2 infection and should not be used as the sole basis for treatment or other patient management decisions. A negative result may occur with  improper specimen collection/handling, submission of specimen other than nasopharyngeal swab, presence of viral mutation(s) within the areas targeted by this assay, and inadequate number of viral copies(<138 copies/mL). A negative result must be combined with clinical observations, patient history, and epidemiological information. The expected result is Negative.  Fact Sheet for Patients:   EntrepreneurPulse.com.au  Fact Sheet for Healthcare Providers:  IncredibleEmployment.be  This test is no t yet approved or cleared by the [place] FDA and  has been authorized for detection and/or diagnosis of SARS-CoV-2 by FDA under an Emergency Use Authorization (EUA). This EUA will remain  in effect (meaning this test can be used) for the duration of the COVID-19 declaration under Section 564(b)(1) of the Act, 21 U.S.C.section 360bbb-3(b)(1),  unless the authorization is terminated  or revoked sooner.       Influenza A by PCR NEGATIVE NEGATIVE Final   Influenza B by PCR NEGATIVE NEGATIVE Final    Comment: (NOTE) The Xpert Xpress SARS-CoV-2/FLU/RSV plus assay is intended as an aid in the diagnosis of influenza from Nasopharyngeal swab specimens and should not be used as a sole basis for treatment. Nasal washings and aspirates are unacceptable for Xpert Xpress SARS-CoV-2/FLU/RSV testing.  Fact Sheet for Patients: EntrepreneurPulse.com.au  Fact Sheet for Healthcare Providers: IncredibleEmployment.be  This test is not yet approved or cleared by the [place] FDA and has been authorized for detection and/or diagnosis of SARS-CoV-2 by FDA under an Emergency Use Authorization (EUA). This EUA will remain in effect (meaning this test can be used) for the duration of the COVID-19 declaration under Section 564(b)(1) of the Act, 21 U.S.C. section 360bbb-3(b)(1), unless the authorization is terminated or revoked.  Performed at [facility], [place] [address] [place], [place] [address]   Culture, blood (Routine x 2)     Status: Abnormal (Preliminary result)   Collection Time: 06/11/21  3:00 PM   Specimen: BLOOD RIGHT ARM  Result Value Ref Range Status   Specimen Description BLOOD RIGHT ARM  Final   Special Requests   Final    BOTTLES DRAWN AEROBIC AND ANAEROBIC Blood Culture adequate volume    Culture (A)  Final    HAEMOPHILUS INFLUENZAE BETA LACTAMASE POSITIVE CRITICAL RESULT CALLED TO, READ BACK BY AND VERIFIED WITH: PHARMD J.FRENS AT 1103 ON 06/13/2021 BY T.SAAD. HEALTH DEPARTMENT NOTIFIED Referred to [facility] in [place], [place] for serotyping. Performed at [facility], [place] [address] [place], [place] [address]    Report Status PENDING  Incomplete  CSF culture w Stat Gram Stain     Status: None (Preliminary result)   Collection Time: 06/12/21  3:40 PM   Specimen: CSF; Cerebrospinal Fluid  Result Value Ref Range Status   Specimen Description CSF  Final   Special Requests Normal  Final   Gram Stain   Final    ABUNDANT WBC PRESENT,BOTH PMN AND MONONUCLEAR NO ORGANISMS SEEN    Culture   Final    NO GROWTH 2 DAYS Performed at [facility], [place] [address] [place], [place] [address]    Report Status PENDING  Incomplete  MRSA Next Gen by PCR, Nasal     Status: None   Collection Time: 06/12/21  4:54 PM   Specimen: Nasal Mucosa; Nasal Swab  Result Value Ref Range Status   MRSA by PCR Next Gen NOT DETECTED NOT DETECTED Final    Comment: (NOTE) The GeneXpert MRSA Assay (FDA approved for NASAL specimens only), is one component of a comprehensive MRSA colonization surveillance program. It is not intended to diagnose MRSA infection nor to guide or monitor treatment for MRSA infections. Test performance is not FDA approved in patients less than 82 years old. Performed at [facility], [place] [address] [place], [place] [address]     Janene Madeira, MSN, NP-C [facility] [place] Pager: [phone]  06/14/2021  2:17 PM

## 2021-06-14 NOTE — Progress Notes (Signed)
Nutrition Brief Note  Pt with PMH of HTN, DM, HLD, temporal bone abnormality with associated intermitent CSF leaks admitted 1/3 for AMS fever.   Pt discussed during ICU rounds and with RN.  Per RN pt is alert and oriented today post extubation.   1/4 pt intubated for LP which showed bacterial meningitis 1/5 started TF per protocol 1/6 extubated; SLP eval pending post MRI; TF d/c'ed  Pt remains NPO pending SLP eval.  Please re-consult as needed.   Lockie Pares., RD, LDN, CNSC See AMiON for contact information

## 2021-06-15 ENCOUNTER — Inpatient Hospital Stay (HOSPITAL_COMMUNITY): Payer: Commercial Managed Care - HMO

## 2021-06-15 DIAGNOSIS — G9600 Cerebrospinal fluid leak, unspecified: Secondary | ICD-10-CM | POA: Diagnosis not present

## 2021-06-15 DIAGNOSIS — R531 Weakness: Secondary | ICD-10-CM | POA: Diagnosis not present

## 2021-06-15 DIAGNOSIS — G9341 Metabolic encephalopathy: Secondary | ICD-10-CM | POA: Diagnosis not present

## 2021-06-15 DIAGNOSIS — G009 Bacterial meningitis, unspecified: Secondary | ICD-10-CM | POA: Diagnosis not present

## 2021-06-15 LAB — CSF CULTURE W GRAM STAIN
Culture: NO GROWTH
Special Requests: NORMAL

## 2021-06-15 LAB — BASIC METABOLIC PANEL
Anion gap: 10 (ref 5–15)
BUN: 19 mg/dL (ref 8–23)
CO2: 22 mmol/L (ref 22–32)
Calcium: 8.6 mg/dL — ABNORMAL LOW (ref 8.9–10.3)
Chloride: 110 mmol/L (ref 98–111)
Creatinine, Ser: 0.53 mg/dL (ref 0.44–1.00)
GFR, Estimated: >60 mL/min (ref >60)
Glucose, Bld: 131 mg/dL — ABNORMAL HIGH (ref 70–99)
Potassium: 4.4 mmol/L (ref 3.5–5.1)
Sodium: 142 mmol/L (ref 135–145)

## 2021-06-15 LAB — GLUCOSE, CAPILLARY
Glucose-Capillary: 133 mg/dL — ABNORMAL HIGH (ref 70–99)
Glucose-Capillary: 135 mg/dL — ABNORMAL HIGH (ref 70–99)
Glucose-Capillary: 135 mg/dL — ABNORMAL HIGH (ref 70–99)
Glucose-Capillary: 142 mg/dL — ABNORMAL HIGH (ref 70–99)
Glucose-Capillary: 146 mg/dL — ABNORMAL HIGH (ref 70–99)
Glucose-Capillary: 155 mg/dL — ABNORMAL HIGH (ref 70–99)

## 2021-06-15 LAB — CBC
HCT: 37.5 % (ref 36.0–46.0)
Hemoglobin: 12.5 g/dL (ref 12.0–15.0)
MCH: 30.6 pg (ref 26.0–34.0)
MCHC: 33.3 g/dL (ref 30.0–36.0)
MCV: 91.7 fL (ref 80.0–100.0)
Platelets: 148 10*3/uL — ABNORMAL LOW (ref 150–400)
RBC: 4.09 MIL/uL (ref 3.87–5.11)
RDW: 13.2 % (ref 11.5–15.5)
WBC: 8.7 10*3/uL (ref 4.0–10.5)
nRBC: 0 % (ref 0.0–0.2)

## 2021-06-15 LAB — PATHOLOGIST SMEAR REVIEW

## 2021-06-15 MED ORDER — HYDRALAZINE HCL 20 MG/ML IJ SOLN
10.0000 mg | INTRAMUSCULAR | Status: DC | PRN
  Administered 2021-06-15: 10 mg via INTRAVENOUS
  Filled 2021-06-15: qty 1

## 2021-06-15 MED ORDER — HYDRALAZINE HCL 20 MG/ML IJ SOLN: 10.0000 mg | INTRAMUSCULAR | Status: DC | PRN

## 2021-06-15 MED ORDER — LABETALOL HCL 5 MG/ML IV SOLN: 10.0000 mg | INTRAVENOUS | Status: DC | PRN

## 2021-06-15 MED ORDER — PANTOPRAZOLE SODIUM 40 MG IV SOLR
40.0000 mg | Freq: Every day | INTRAVENOUS | Status: DC
  Administered 2021-06-15: 40 mg via INTRAVENOUS
  Filled 2021-06-15: qty 40

## 2021-06-15 NOTE — Progress Notes (Signed)
Subjective:  Patient is encephalopathic  Antibiotics:  Anti-infectives (From admission, onward)    Start     Dose/Rate Route Frequency Ordered Stop   06/13/21 1015  metroNIDAZOLE (FLAGYL) IVPB 500 mg        500 mg 100 mL/hr over 60 Minutes Intravenous Every 8 hours 06/13/21 0929     06/12/21 1630  vancomycin (VANCOREADY) IVPB 1750 mg/350 mL  Status:  Discontinued        1,750 mg 175 mL/hr over 120 Minutes Intravenous Every 24 hours 06/11/21 1955 06/11/21 2000   06/12/21 1630  vancomycin (VANCOREADY) IVPB 1500 mg/300 mL  Status:  Discontinued        1,500 mg 150 mL/hr over 120 Minutes Intravenous Every 24 hours 06/11/21 2000 06/11/21 2132   06/12/21 0600  vancomycin (VANCOCIN) IVPB 1000 mg/200 mL premix  Status:  Discontinued        1,000 mg 200 mL/hr over 60 Minutes Intravenous Every 12 hours 06/11/21 2132 06/13/21 1127   06/11/21 2300  ceFEPIme (MAXIPIME) 2 g in sodium chloride 0.9 % 100 mL IVPB  Status:  Discontinued        2 g 200 mL/hr over 30 Minutes Intravenous Every 8 hours 06/11/21 1955 06/11/21 2123   06/11/21 2215  ampicillin (OMNIPEN) 2 g in sodium chloride 0.9 % 100 mL IVPB  Status:  Discontinued        2 g 300 mL/hr over 20 Minutes Intravenous Every 4 hours 06/11/21 2123 06/13/21 0929   06/11/21 2200  cefTRIAXone (ROCEPHIN) 2 g in sodium chloride 0.9 % 100 mL IVPB        2 g 200 mL/hr over 30 Minutes Intravenous Every 12 hours 06/11/21 2123 06/25/21 2159   06/11/21 1445  vancomycin (VANCOREADY) IVPB 2000 mg/400 mL        2,000 mg 200 mL/hr over 120 Minutes Intravenous  Once 06/11/21 1437 06/11/21 1843   06/11/21 1430  ceFEPIme (MAXIPIME) 2 g in sodium chloride 0.9 % 100 mL IVPB        2 g 200 mL/hr over 30 Minutes Intravenous  Once 06/11/21 1424 06/11/21 1627   06/11/21 1430  metroNIDAZOLE (FLAGYL) IVPB 500 mg        500 mg 100 mL/hr over 60 Minutes Intravenous  Once 06/11/21 1424 06/11/21 1627   06/11/21 1430  vancomycin (VANCOCIN) IVPB 1000 mg/200 mL  premix  Status:  Discontinued        1,000 mg 200 mL/hr over 60 Minutes Intravenous  Once 06/11/21 1424 06/11/21 1437       Medications: Scheduled Meds:  aspirin  81 mg Per Tube Daily   Chlorhexidine Gluconate Cloth  6 each Topical Daily   docusate  100 mg Per Tube BID   enoxaparin (LOVENOX) injection  60 mg Subcutaneous Q24H   insulin aspart  0-15 Units Subcutaneous Q4H   pantoprazole sodium  40 mg Per Tube QHS   polyethylene glycol  17 g Per Tube Daily   sertraline  100 mg Per Tube Daily   sodium chloride flush  3 mL Intravenous Q12H   Continuous Infusions:  cefTRIAXone (ROCEPHIN)  IV 2 g (06/15/21 0924)   dexmedetomidine (PRECEDEX) IV infusion 1 mcg/kg/hr (06/15/21 1016)   fentaNYL infusion INTRAVENOUS Stopped (06/14/21 1028)   lactated ringers 75 mL/hr at 06/15/21 0800   metronidazole 500 mg (06/15/21 1012)   PRN Meds:.acetaminophen **OR** acetaminophen, fentaNYL, labetalol, midazolam    Objective: Weight change:   Intake/Output Summary (Last  24 hours) at 06/15/2021 1214 Last data filed at 06/15/2021 0800 Gross per 24 hour  Intake 1849.85 ml  Output 575 ml  Net 1274.85 ml   Blood pressure (!) 163/90, pulse (!) 43, temperature 99.2 F (37.3 C), temperature source Axillary, resp. rate (!) 36, height 5\' 6"  (1.676 m), weight 117.3 kg, last menstrual period 11/02/2013, SpO2 100 %. Temp:  [99.2 F (37.3 C)-101.3 F (38.5 C)] 99.2 F (37.3 C) (01/07 0800) Pulse Rate:  [43-93] 43 (01/07 1100) Resp:  [16-36] 36 (01/07 1100) BP: (131-183)/(67-115) 163/90 (01/07 1100) SpO2:  [82 %-100 %] 100 % (01/07 1100)  Physical Exam: Physical Exam Constitutional:      Appearance: She is obese. She is ill-appearing.  HENT:     Head: Normocephalic and atraumatic.  Eyes:     General:        Right eye: No discharge.        Left eye: Discharge present.    Extraocular Movements: Extraocular movements intact.  Cardiovascular:     Heart sounds: No murmur heard.   No friction rub. No  gallop.  Pulmonary:     Effort: No respiratory distress.     Breath sounds: No stridor. No wheezing or rhonchi.  Abdominal:     General: Bowel sounds are normal. There is no distension.  Neurological:     Mental Status: She is disoriented.    Patient restrained and is encephalopathic  CBC:    BMET Recent Labs    06/14/21 0455 06/14/21 1616 06/15/21 0536  NA 141 144 142  K 3.7 3.3* 4.4  CL 108  --  110  CO2 26  --  22  GLUCOSE 140*  --  131*  BUN 21  --  19  CREATININE 0.73  --  0.53  CALCIUM 8.2*  --  8.6*     Liver Panel  Recent Labs    06/13/21 0701  PROT 6.0*  ALBUMIN 2.8*  AST 54*  ALT 39  ALKPHOS 41  BILITOT 0.3       Sedimentation Rate No results for input(s): ESRSEDRATE in the last 72 hours. C-Reactive Protein No results for input(s): CRP in the last 72 hours.  Micro Results: Recent Results (from the past 720 hour(s))  Culture, blood (Routine x 2)     Status: None (Preliminary result)   Collection Time: 06/11/21  2:23 PM   Specimen: BLOOD LEFT HAND  Result Value Ref Range Status   Specimen Description BLOOD LEFT HAND  Final   Special Requests   Final    BOTTLES DRAWN AEROBIC ONLY Blood Culture results may not be optimal due to an inadequate volume of blood received in culture bottles   Culture   Final    NO GROWTH 4 DAYS Performed at [facility], [place] [address], [place], [place] [address]    Report Status PENDING  Incomplete  Urine Culture     Status: None   Collection Time: 06/11/21  2:24 PM   Specimen: In/Out Cath Urine  Result Value Ref Range Status   Specimen Description IN/OUT CATH URINE  Final   Special Requests NONE  Final   Culture   Final    NO GROWTH Performed at [facility], [place] [address], [place], [place] [address]    Report Status 06/12/2021 FINAL  Final  Resp Panel by RT-PCR (Flu A&B, Covid) Nasopharyngeal Swab     Status: None   Collection Time: 06/11/21  2:51 PM   Specimen:  Nasopharyngeal Swab;  Nasopharyngeal(NP) swabs in vial transport medium  Result Value Ref Range Status   SARS Coronavirus 2 by RT PCR NEGATIVE NEGATIVE Final    Comment: (NOTE) SARS-CoV-2 target nucleic acids are NOT DETECTED.  The SARS-CoV-2 RNA is generally detectable in upper respiratory specimens during the acute phase of infection. The lowest concentration of SARS-CoV-2 viral copies this assay can detect is 138 copies/mL. A negative result does not preclude SARS-Cov-2 infection and should not be used as the sole basis for treatment or other patient management decisions. A negative result may occur with  improper specimen collection/handling, submission of specimen other than nasopharyngeal swab, presence of viral mutation(s) within the areas targeted by this assay, and inadequate number of viral copies(<138 copies/mL). A negative result must be combined with clinical observations, patient history, and epidemiological information. The expected result is Negative.  Fact Sheet for Patients:  EntrepreneurPulse.com.au  Fact Sheet for Healthcare Providers:  IncredibleEmployment.be  This test is no t yet approved or cleared by the [place] FDA and  has been authorized for detection and/or diagnosis of SARS-CoV-2 by FDA under an Emergency Use Authorization (EUA). This EUA will remain  in effect (meaning this test can be used) for the duration of the COVID-19 declaration under Section 564(b)(1) of the Act, 21 U.S.C.section 360bbb-3(b)(1), unless the authorization is terminated  or revoked sooner.       Influenza A by PCR NEGATIVE NEGATIVE Final   Influenza B by PCR NEGATIVE NEGATIVE Final    Comment: (NOTE) The Xpert Xpress SARS-CoV-2/FLU/RSV plus assay is intended as an aid in the diagnosis of influenza from Nasopharyngeal swab specimens and should not be used as a sole basis for treatment. Nasal washings and aspirates are unacceptable for Xpert Xpress  SARS-CoV-2/FLU/RSV testing.  Fact Sheet for Patients: EntrepreneurPulse.com.au  Fact Sheet for Healthcare Providers: IncredibleEmployment.be  This test is not yet approved or cleared by the [place] FDA and has been authorized for detection and/or diagnosis of SARS-CoV-2 by FDA under an Emergency Use Authorization (EUA). This EUA will remain in effect (meaning this test can be used) for the duration of the COVID-19 declaration under Section 564(b)(1) of the Act, 21 U.S.C. section 360bbb-3(b)(1), unless the authorization is terminated or revoked.  Performed at [facility], [place] [address], [place], [place] [address]   Culture, blood (Routine x 2)     Status: Abnormal (Preliminary result)   Collection Time: 06/11/21  3:00 PM   Specimen: BLOOD RIGHT ARM  Result Value Ref Range Status   Specimen Description BLOOD RIGHT ARM  Final   Special Requests   Final    BOTTLES DRAWN AEROBIC AND ANAEROBIC Blood Culture adequate volume   Culture (A)  Final    HAEMOPHILUS INFLUENZAE BETA LACTAMASE POSITIVE CRITICAL RESULT CALLED TO, READ BACK BY AND VERIFIED WITH: PHARMD J.FRENS AT 2620 ON 06/13/2021 BY T.SAAD. HEALTH DEPARTMENT NOTIFIED Referred to [facility] in [place], [place] for serotyping. Performed at [facility], [place] [address], [place], [place] [address]    Report Status PENDING  Incomplete  CSF culture w Stat Gram Stain     Status: None (Preliminary result)   Collection Time: 06/12/21  3:40 PM   Specimen: CSF; Cerebrospinal Fluid  Result Value Ref Range Status   Specimen Description CSF  Final   Special Requests Normal  Final   Gram Stain   Final    ABUNDANT WBC PRESENT,BOTH PMN AND MONONUCLEAR NO ORGANISMS SEEN    Culture  Final    NO GROWTH 3 DAYS Performed at [facility], [place] [address] [place], [place] [address]    Report Status PENDING  Incomplete  MRSA Next Gen by PCR, Nasal     Status: None    Collection Time: 06/12/21  4:54 PM   Specimen: Nasal Mucosa; Nasal Swab  Result Value Ref Range Status   MRSA by PCR Next Gen NOT DETECTED NOT DETECTED Final    Comment: (NOTE) The GeneXpert MRSA Assay (FDA approved for NASAL specimens only), is one component of a comprehensive MRSA colonization surveillance program. It is not intended to diagnose MRSA infection nor to guide or monitor treatment for MRSA infections. Test performance is not FDA approved in patients less than 27 years old. Performed at [facility], [place] [address], [place], [place] [address]     Studies/Results: MR BRAIN WO CONTRAST  Result Date: 06/14/2021 CLINICAL DATA:  Neuro deficit, acute, stroke suspected; new onset RUE and RLE weakness in patient with bacterial meningitis EXAM: MRI HEAD WITHOUT CONTRAST TECHNIQUE: Multiplanar, multiecho pulse sequences of the brain and surrounding structures were obtained without intravenous contrast. COMPARISON:  06/13/2021 FINDINGS: Only axial and coronal DWI sequences were obtained. Patient could not continue with remainder of study. Suspect trace abnormal diffusion signal in the dependent occipital horns and dependent fourth ventricle on the left. No acute infarction. Ventricles are stable in size without hydrocephalus. No parenchymal edema. IMPRESSION: Single sequence study.  Patient could not continue with remainder. Suspect minimal layering debris in the ventricles suggesting ventriculitis. No hydrocephalus. No parenchymal lesion or edema. Electronically Signed   By: Macy Mis M.D.   On: 06/14/2021 16:03      Assessment/Plan:  INTERVAL HISTORY: Patient had right-sided weakness yesterday afternoon and had MRI attempted but the patient then became agitated and confused.  She is much much more confused today versus yesterday when she was quite interactive and talkative with [place]   Principal Problem:   Acute bacterial meningitis Active Problems:   Diabetes mellitus,  type 2 (HCC)   CSF leak   Acute metabolic encephalopathy   Altered mental status   Pressure injury of skin   Sepsis due to Haemophilus influenzae with acute hypoxic respiratory failure and septic shock (HCC)   Fever   Bacterial meningitis   Right sided weakness    Lisa [name] is a 61 y.o. 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  She has had recent right-sided weakness but MRI was not capable of being done yesterday.  #1 haemophilus influenza bacteremia and meningitis:  Continue ceftriaxone 2 g IV every 12 hours  As she improves I will want to get a repeat MRI prior to stopping parenteral therapy because I have concerned that she will develop brain abscesses that may require neurosurgical intervention and/or prolonged IV therapy.  #2 encephalopathy: Likely multifactorial neurology and critical care following closely.  #3 right-sided weakness: Hopefully she can get an MRI at later again I would want 1 regardless at some point to look for potential brain abscess  #4 CSF leak: Chronic but now with a fulminant case of meningitis defer to neurosurgery ultimately potential repair of these 2 sites  I spent 36 minutes with the patient including than 50% of the time in face to face counseling of the patients surrogate her daughter regarding the nature of her  bacterial meningitis CSF leak personally reviewing chest x-ray updated culture CBC BMP along with review of medical records in preparation for the visit and during the visit and in coordination of her care.    LOS: 4 days   Alcide Evener 06/15/2021, 12:14 PM

## 2021-06-15 NOTE — Progress Notes (Signed)
Neurology progress note  S: Patient is on precedex, lethargic, disoriented.  O:   Vitals:   06/15/21 1700 06/15/21 1800  BP: (!) 176/85 127/76  Pulse: (!) 45 (!) 41  Resp: 16 (!) 30  Temp:    SpO2: 98% 98%    Physical Exam Gen: lethargic, oriented to self, follows some simple commands HEENT: Atraumatic, normocephalic; oropharynx clear, tongue without atrophy or fasciculations. Resp: CTAB, normal work of breathing CV: RRR, extremities appear well-perfused. Abd: soft/NT/ND Extrem: Nml bulk; no cyanosis, clubbing, or edema.  Neuro: *MS: lethargic, oriented to self, follows some simple commands *Speech: severe dysarthria, UTA aphasia *CN: PERRL, (+) corneals, EOMI, blinks to threat bilat, protecting airway, face symmetric at rest, hearing intact to voice *Motor & sensory: withdraws less briskly on R side compared to L side (arm and leg), no spontaneous movement in any extremity *Reflexes:  2+ and symmetric throughout without clonus; toes down-going bilat *Coordinatio, Gait: UTA  MRI brain wwo 06/12/21  1. Diffuse leptomeningeal enhancement involving both cerebral hemispheres, highly suggestive of acute meningitis. No complicating features. 2. Extensive paranasal sinus disease with large bilateral mastoid effusions. While these findings are of unknown acuity, possible acute sinusitis and/or mastoiditis could be present, and could potentially serve as a source for seeding of CNS infection.  CNS imaging personally reviewed  A/P: 61 yo woman with chronic CSF leak admitted with encephalopathy and fevers and found to have CSF c/w bacterial meningitis and imaging c/f mastoiditis. ID consulted; patient is currently on ceftriaxone and flagyl. Patient extubated 1/6  and at that time was noted to have mild R sided weakness therefore neurology was reconsulted. On my examination 1/6 she had drift in RUE and RLE, dysarthria, and facial droop for NIHSS = 4. LKW 24+ hrs prior to intubation and  sedation. MRI brain approx 30 hrs prior showed no e/o acute infarct or abscess on contrasted sequences. Exam not c/w LVO therefore stroke code not activated. Repeat MRI brain wwo contrast was attempted but patient was unable to tolerate 2/2 panic, procedure was aborted. She subsequently was found to have SpO2 83% and RR 35-40. She was placed on NRB at 10L and restarted on precedex. I am concerned that she may have either had a stroke (potentially septic emboli given her bacteremia) or may be developing a brain abscess. MRI brain wwo will be deferred until she is able to tolerate it. In the meantime, will hook back up to LTM EEG.  - Aspirin 81mg  daily - Plavix 75mg  daily - MRI brain with and without contrast when patient able to tolerate - Hook back up to LTM EEG - Abx per ID - Will continue to follow.  Su Monks, MD [facility] [phone]  If 7pm- 7am, please page neurology on call as listed in [place].

## 2021-06-15 NOTE — Evaluation (Signed)
Occupational Therapy Evaluation Patient Details Name: Lisa [name] MRN: [id number] DOB: 07-Apr-1961 Today's Date: 06/15/2021   History of Present Illness 61 y/o female presented to ED on 06/11/21 for AMS after flu like symptoms. Head CT negative. Lumbar puncture on 1/4. CSF findings highly suspicious for bacterial meningitis. Intubated 1/4-1/6. After extubation, patient with mild R sided weakness. MRI with suspect minimal layering debris in ventricles suggesting ventriculitis but study limited by agitation and movement. 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 Deficits impacting independence are listed below.  Currently she is lethargic with minimal interaction.  OT to continue efforts hoping to progress her to sitting edge of bed.  Currently AIR is being recommended for post acute rehab prior to returning home.       Recommendations for follow up therapy are one component of a multi-disciplinary discharge planning process, led by the attending physician.  Recommendations may be updated based on patient status, additional functional criteria and insurance authorization.   Follow Up Recommendations  Acute inpatient rehab (3hours/day)    Assistance Recommended at Discharge Frequent or constant Supervision/Assistance  Patient can return home with the following Two people to help with walking and/or transfers;Assistance with feeding    Functional Status Assessment  Patient has had a recent decline in their functional status and demonstrates the ability to make significant improvements in function in a reasonable and predictable amount of time.  Equipment Recommendations  None recommended  by OT    Recommendations for Other Services       Precautions / Restrictions Precautions Precautions: Fall Precaution Comments: wrist restraints, mittens Restrictions Weight Bearing Restrictions: No      Mobility Bed Mobility Overal bed mobility: Needs Assistance Bed Mobility: Rolling Rolling: Total assist;+2 for physical assistance         General bed mobility comments: placed higher in bed with HOB elevated with no increase in arousal Patient Response: Cooperative  Transfers                   General transfer comment: deferred      Balance                                           ADL either performed or assessed with clinical judgement   ADL Overall ADL's : Needs assistance/impaired Eating/Feeding: Total assistance;Bed level   Grooming: Total assistance;Bed level   Upper Body Bathing: Total assistance;Bed level   Lower Body Bathing: Total assistance;Bed level       Lower Body Dressing: Total assistance;Bed level       Toileting- Clothing Manipulation and Hygiene: Total assistance;Bed level               Vision   Vision Assessment?: Vision impaired- to be further tested in functional context     Perception Perception Perception: Not tested   Praxis Praxis Praxis: Not tested    Pertinent Vitals/Pain Pain Assessment: Faces Faces Pain Scale: No hurt Pain Intervention(s): Monitored during session     Hand Dominance Right   Extremity/Trunk Assessment Upper Extremity Assessment Upper Extremity  Assessment: Generalized weakness   Lower Extremity Assessment Lower Extremity Assessment: Defer to PT evaluation   Cervical / Trunk Assessment Cervical / Trunk Assessment: Normal   Communication Communication Communication: No difficulties   Cognition Arousal/Alertness: Lethargic;Suspect due to medications Behavior During Therapy: Flat affect Overall Cognitive Status: Difficult to assess                                  General Comments: difficult to fully assess due to lethargy. Follows simple 1 step commands with significant delay     General Comments  VSS on 15 L HFNC    Exercises     Shoulder Instructions      Home Living Family/patient expects to be discharged to:: Private residence Living Arrangements: Spouse/significant other;Children Available Help at Discharge: Family Type of Home: House Home Access: Stairs to enter Technical brewer of Steps: 1   Home Layout: One level     Bathroom Shower/Tub: Occupational psychologist: Standard     Home Equipment: Grab bars - tub/shower;Hand held shower head;Shower seat;Rollator (4 wheels);Cane - single point          Prior Functioning/Environment Prior Level of Function : Independent/Modified Independent             Mobility Comments: uses rollator for mobility, occasional cane use          OT Problem List: Decreased strength;Decreased range of motion;Decreased activity tolerance;Impaired balance (sitting and/or standing);Decreased safety awareness;Decreased cognition      OT Treatment/Interventions: Self-care/ADL training;Therapeutic exercise;Therapeutic activities;Cognitive remediation/compensation;DME and/or AE instruction;Patient/family education;Balance training    OT Goals(Current goals can be found in the care plan section) Acute Rehab OT Goals OT Goal Formulation: Patient unable to participate in goal setting Time For Goal Achievement: 06/29/21 Potential to Achieve Goals: Fair ADL Goals Pt Will Perform Grooming: with min assist;sitting Pt Will Perform Upper Body Bathing: with min assist;sitting Pt Will Perform Upper Body Dressing: with min assist;sitting Additional ADL Goal #1: Patient will be Mod A for supine to sit to increase independence with toileting  OT Frequency: Min 2X/week    Co-evaluation PT/OT/SLP Co-Evaluation/Treatment: Yes Reason for Co-Treatment: Complexity of the  patient's impairments (multi-system involvement);For patient/therapist safety;To address functional/ADL transfers PT goals addressed during session: Strengthening/ROM OT goals addressed during session: ADL's and self-care      AM-PAC OT "6 Clicks" Daily Activity     Outcome Measure Help from another person eating meals?: Total Help from another person taking care of personal grooming?: Total Help from another person toileting, which includes using toliet, bedpan, or urinal?: Total Help from another person bathing (including washing, rinsing, drying)?: Total Help from another person to put on and taking off regular upper body clothing?: Total Help from another person to put on and taking off regular lower body clothing?: Total 6 Click Score: 6   End of Session Nurse Communication: Mobility status  Activity Tolerance: Patient limited by lethargy Patient left: in bed;with call bell/phone within reach;with family/visitor present  OT Visit Diagnosis: Unsteadiness on feet (R26.81);Muscle weakness (generalized) (M62.81)                Time: 5027-7412 OT Time Calculation (min): 15 min Charges:  OT General Charges $OT Visit: 1 Visit OT Evaluation $OT Eval Moderate Complexity: 1 Mod  06/15/2021  RP, OTR/L  Acute Rehabilitation Services  Office:  [place] 06/15/2021, 2:03 PM

## 2021-06-15 NOTE — Progress Notes (Signed)
Inpatient Rehab Admissions Coordinator:   Per PT recommendations, patient was screened for CIR candidacy by Clemens Catholic, MS, CCC-SLP . At this time, Pt. is not participating well enough and did not rouse for bed level exercises even during PT eval, so it is not clear she can tolerate intensity of CIR; however,   Pt. may have potential to progress to becoming a potential CIR candidate, so CIR admissions team will follow and monitor for progress and participation with therapies and place consult order if Pt. appears to be an appropriate candidate. Please contact me with any questions.   Clemens Catholic, [place], [place] Admissions Coordinator  [phone] ([place]) [phone] (office)

## 2021-06-15 NOTE — Evaluation (Signed)
Physical Therapy Evaluation Patient Details Name: Lisa [name] MRN: [id number] DOB: 1961/05/23 Today's Date: 06/15/2021  History of Present Illness  61 y/o female presented to ED on 06/11/21 for AMS after flu like symptoms. Head CT negative. Lumbar puncture on 1/4. CSF findings highly suspicious for bacterial meningitis. Intubated 1/4-1/6. After extubation, patient with mild R sided weakness. MRI with suspect minimal layering debris in ventricles suggesting ventriculitis but study limited by agitation and movement. 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 Patient currently presents with generalized weakness, impaired cognition, impaired coordination, impaired balance, and decreased activity tolerance. Patient lethargic throughout session and requiring frequent cues to keep eyes open, suspect due to medications. Patient following commands inconsistently and with significant delay. VSS on 15L HFNC. Daughter present and providing information about patient. Patient will benefit from skilled PT services during acute stay to address listed deficits. Recommend CIR at discharge to maximize functional mobility and safety.        Recommendations for follow up therapy are one component of a multi-disciplinary discharge planning process, led by the attending physician.  Recommendations may be updated based on patient status, additional functional criteria and insurance authorization.  Follow Up Recommendations Acute inpatient rehab (3hours/day)    Assistance Recommended at Discharge Frequent or constant Supervision/Assistance  Patient can return home with the following  Two people to help with walking and/or transfers;Two people to help with bathing/dressing/bathroom;Assistance with cooking/housework;Assistance with feeding;Direct supervision/assist for financial management;Direct  supervision/assist for medications management;Assist for transportation;Help with stairs or ramp for entrance    Equipment Recommendations Other (comment) (TBD)  Recommendations for Other Services  Rehab consult    Functional Status Assessment Patient has had a recent decline in their functional status and demonstrates the ability to make significant improvements in function in a reasonable and predictable amount of time.     Precautions / Restrictions Precautions Precautions: Fall Precaution Comments: wrist restraints, mittens Restrictions Weight Bearing Restrictions: No      Mobility  Bed Mobility               General bed mobility comments: placed higher in bed with HOB elevated with no increase in arousal    Transfers                   General transfer comment: deferred    Ambulation/Gait                  Stairs            Wheelchair Mobility    Modified Rankin (Stroke Patients Only)       Balance                                             Pertinent Vitals/Pain Pain Assessment: Faces Faces Pain Scale: No hurt Pain Intervention(s): Monitored during session    Home Living Family/patient expects to be discharged to:: Private residence Living Arrangements: Spouse/significant other;Children Available Help at Discharge: Family Type of Home: House Home Access: Stairs to enter   Technical brewer of Steps: 1   Home Layout: One level Home Equipment: Grab bars - tub/shower;Hand held shower head;Shower seat;Rollator (4 wheels);Cane - single point      Prior Function Prior Level of Function : Independent/Modified Independent  Mobility Comments: uses rollator for mobility, occasional cane use       Hand Dominance        Extremity/Trunk Assessment   Upper Extremity Assessment Upper Extremity Assessment: Defer to OT evaluation    Lower Extremity Assessment Lower Extremity  Assessment: Generalized weakness;Difficult to assess due to impaired cognition    Cervical / Trunk Assessment Cervical / Trunk Assessment: Normal  Communication   Communication: No difficulties  Cognition Arousal/Alertness: Lethargic;Suspect due to medications Behavior During Therapy: Flat affect Overall Cognitive Status: Difficult to assess                                 General Comments: difficult to fully assess due to lethargy. Follows simple 1 step commands with significant delay Functional Status Assessment: Patient has had a recent decline in their functional status and demonstrates the ability to make significant improvements in function in a reasonable and predictable amount of time.      General Comments General comments (skin integrity, edema, etc.): VSS on 15 L HFNC    Exercises     Assessment/Plan    PT Assessment Patient needs continued PT services  PT Problem List Decreased strength;Decreased balance;Decreased activity tolerance;Decreased mobility;Decreased coordination;Decreased cognition;Decreased knowledge of use of DME;Decreased safety awareness;Cardiopulmonary status limiting activity;Decreased knowledge of precautions       PT Treatment Interventions DME instruction;Gait training;Stair training;Functional mobility training;Therapeutic activities;Therapeutic exercise;Balance training;Patient/family education    PT Goals (Current goals can be found in the Care Plan section)  Acute Rehab PT Goals Patient Stated Goal: did not state PT Goal Formulation: With family Time For Goal Achievement: 06/29/21 Potential to Achieve Goals: Fair    Frequency Min 3X/week     Co-evaluation PT/OT/SLP Co-Evaluation/Treatment: Yes Reason for Co-Treatment: For patient/therapist safety;To address functional/ADL transfers;Necessary to address cognition/behavior during functional activity PT goals addressed during session: Strengthening/ROM         AM-PAC  PT "6 Clicks" Mobility  Outcome Measure Help needed turning from your back to your side while in a flat bed without using bedrails?: Total Help needed moving from lying on your back to sitting on the side of a flat bed without using bedrails?: Total Help needed moving to and from a bed to a chair (including a wheelchair)?: Total Help needed standing up from a chair using your arms (e.g., wheelchair or bedside chair)?: Total Help needed to walk in hospital room?: Total Help needed climbing 3-5 steps with a railing? : Total 6 Click Score: 6    End of Session Equipment Utilized During Treatment: Oxygen Activity Tolerance: Patient limited by lethargy Patient left: in bed;with call bell/phone within reach;with bed alarm set;with restraints reapplied Nurse Communication: Mobility status PT Visit Diagnosis: Muscle weakness (generalized) (M62.81);Other abnormalities of gait and mobility (R26.89);Other symptoms and signs involving the nervous system (R29.898)    Time: 3762-8315 PT Time Calculation (min) (ACUTE ONLY): 15 min   Charges:   PT Evaluation $PT Eval Moderate Complexity: 1 Mod          Cartel Mauss A. Gilford Rile PT, DPT Acute Rehabilitation Services Pager [phone] Office [phone]   Linna Hoff 06/15/2021, 12:58 PM

## 2021-06-15 NOTE — Progress Notes (Signed)
Ltm hooked up at bedside. No skin breakdown noted. Results pending.

## 2021-06-15 NOTE — Evaluation (Signed)
Clinical/Bedside Swallow Evaluation Patient Details  Name: Lisa [name] MRN: [id number] Date of Birth: [birth year]  Today's Date: 06/15/2021 Time: SLP Start Time (ACUTE ONLY): 1000 SLP Stop Time (ACUTE ONLY): 1025 SLP Time Calculation (min) (ACUTE ONLY): 25 min  Past Medical History:  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    Polycystic ovarian disease    Past Surgical History:  Past Surgical History:  Procedure Laterality Date   csf leak in skull  2005   noted failed attempt to close CSF leak in skull   DILATION AND EVACUATION  1990   miscarriage   IR FLUORO GUIDED NEEDLE PLC ASPIRATION/INJECTION LOC  06/12/2021   RADIOLOGY WITH ANESTHESIA N/A 06/12/2021   Procedure: IR WITH ANESTHESIA;  Surgeon: Radiologist, Medication, MD;  Location: [facility];  Service: Radiology;  Laterality: N/A;   TONSILLECTOMY AND ADENOIDECTOMY  19757   HPI:  61 year old person living with diabetes and a history of a temporal bone abnormality leading to intermittent CSF leakage came to the emergency department with acute encephalopathy and fever, admitted to our service for management of probable acute bacterial meningitis.  Patient is unable to provide any history due to encephalopathy.  Her husband tells me that she has had an intermittent CSF leak for about 15 years.  Typically if she lifts a heavy object she will notice some serous drainage from her ear and then into her nose, this will resolve after a few hours, she then develops a headache for about 2 days, and then is back to normal.  This is the first time she has had a febrile illness associated with a CSF leaking event.  She was offered surgical repair many years ago but deferred at the time; MRI brain 06/13/21 Diffuse leptomeningeal enhancement  involving both cerebral  hemispheres, highly suggestive of acute meningitis. No complicating  features.  2. Extensive paranasal sinus disease with large bilateral mastoid  effusions. While these findings are of unknown acuity, possible  acute sinusitis and/or mastoiditis could be present, and could  potentially serve as a source for seeding of CNS infection.   BSE generated    Assessment / Plan / Recommendation  Clinical Impression  Pt seen for a clinical swallowing evaluation with a cognitive-based oropharyngeal dysphagia noted characterized by poor awareness of boluses, oral holding, prolonged oral transit, delay in the initiation of the swallow and delayed throat clearing.  Pt's mentation impairs overall oral/pharyngeal swallow function primarily with continued confusion/delirium noted with intermittent appropriate answers to personal information questions, but pt kept eyes closed during entire assessment with mod-max verbal/tactile cues required during BSE.  Recommend NPO continue with ST f/u for PO readiness.  Continue medications via alternative means and ice chips allowed with oral care prior to intake.  Thank you for this consult.  SLP Visit Diagnosis: Dysphagia, unspecified (R13.10)    Aspiration Risk  Mild aspiration risk;Moderate aspiration risk;Risk for inadequate nutrition/hydration    Diet Recommendation   NPO  Medication Administration: Via alternative means    Other  Recommendations Oral Care Recommendations: Oral care QID;Oral care prior to ice chip/H20    Recommendations for follow up therapy are one component of a multi-disciplinary discharge planning process, led by  the attending physician.  Recommendations may be updated based on patient status, additional functional criteria and insurance authorization.  Follow up Recommendations Follow physician's recommendations for discharge plan and follow up therapies      Assistance Recommended at Discharge Other (comment) (TBD)   Functional Status Assessment Patient has had a recent decline in their functional status and demonstrates the ability to make significant improvements in function in a reasonable and predictable amount of time.  Frequency and Duration min 2x/week  1 week       Prognosis Prognosis for Safe Diet Advancement: Good Barriers to Reach Goals: Cognitive deficits      Swallow Study   General Date of Onset: 06/11/21 HPI: 61 year old person living with diabetes and a history of a temporal bone abnormality leading to intermittent CSF leakage came to the emergency department with acute encephalopathy and fever, admitted to our service for management of probable acute bacterial meningitis.  Patient is unable to provide any history due to encephalopathy.  Her husband tells me that she has had an intermittent CSF leak for about 15 years.  Typically if she lifts a heavy object she will notice some serous drainage from her ear and then into her nose, this will resolve after a few hours, she then develops a headache for about 2 days, and then is back to normal.  This is the first time she has had a febrile illness associated with a CSF leaking event.  She was offered surgical repair many years ago but deferred at the time; MRI brain 06/13/21 Diffuse leptomeningeal enhancement involving both cerebral  hemispheres, highly suggestive of acute meningitis. No complicating  features.  2. Extensive paranasal sinus disease with large bilateral mastoid  effusions. While these findings are of unknown acuity, possible  acute sinusitis and/or mastoiditis could be present, and could  potentially serve as a source for seeding of CNS infection.   BSE generated Type of Study: Bedside Swallow Evaluation Previous Swallow Assessment: n/a Diet Prior to this Study: NPO Temperature Spikes Noted: Yes (99, low grade) Respiratory Status: Nasal cannula History of Recent Intubation: Yes Length of Intubations (days): 2 days Date extubated:  06/14/21 Behavior/Cognition: Lethargic/Drowsy;Distractible;Requires cueing Oral Cavity Assessment: Dry Oral Care Completed by SLP: Yes Oral Cavity - Dentition: Adequate natural dentition Self-Feeding Abilities: Total assist Patient Positioning: Upright in bed Baseline Vocal Quality: Low vocal intensity;Breathy Volitional Cough: Strong Volitional Swallow: Able to elicit (with max cues)    Oral/Motor/Sensory Function Overall Oral Motor/Sensory Function: Other (comment) (UTA; generalized weakness)   Ice Chips Ice chips: Impaired Presentation: Spoon Oral Phase Impairments: Reduced lingual movement/coordination;Poor awareness of bolus Oral Phase Functional Implications: Prolonged oral transit;Oral holding Pharyngeal Phase Impairments: Suspected delayed Swallow   Thin Liquid Thin Liquid: Impaired Presentation: Spoon Oral Phase Impairments: Reduced lingual movement/coordination;Poor awareness of bolus Oral Phase Functional Implications: Oral holding Pharyngeal  Phase Impairments: Suspected delayed Swallow;Throat Clearing - Delayed    Nectar Thick Nectar Thick Liquid: Not tested   Honey Thick Honey Thick Liquid: Not tested   Puree Puree: Impaired Presentation: Spoon Oral Phase Impairments: Reduced lingual movement/coordination;Impaired mastication;Poor awareness of bolus Oral Phase Functional Implications: Oral holding;Other (comment) (removed from oral cavity)   Solid     Solid: Not tested      Elvina Sidle, M.S., CCC-SLP 06/15/2021,12:54 PM

## 2021-06-15 NOTE — Progress Notes (Signed)
NAME:  Lisa [name], MRN:  [id number], DOB:  Jul 03, 1960, LOS: 4 ADMISSION DATE:  06/11/2021, CONSULTATION DATE:  06/12/20 REFERRING MD:  Collene Gobble - IMTS, CHIEF COMPLAINT:  Altered mental status // Reason for consult: ventilator management   History of Present Illness:  61 yo F PMH temporal bone abnormality with associated intermitted CSF leak presented to [facility] 1/3 with AMS and fever. Intermittent CSF leak x 15 years, associated HA for about 2 days after each CSF leak episode. LP was attempted x3 in ED without success. Admitted to IMTS for suspected bacterial meningitis. She was started on vanc, rocephin, ampicillin. he also exhibited agitated delirium and was given Haldol and zyprexa.  On 1/4 the patient went to IR for LP and required intubation for the procedure. She remains intubated after the procedure. LP yielded 94ml cloudy CSF fluid which was sent for analysis   PCCM is consulted in this setting   Admitting labs reveal leukocytosis WBC 14.2 remaining CBC WNL. Metabolic abnormalities include Na 133 Glu 210 Ca 8.7  Pertinent  Medical History  Temporal bone abnormality CSF leaks HTN HLD DM2 Chronic pain  Rectocele  Depression  Sleep apnea Hepatic steatosis   Significant Hospital Events: Including procedures, antibiotic start and stop dates in addition to other pertinent events   1/3 admitted to IMTS for suspected bacterial meningitis. LP attempt x3 in ED without success. Haldol, zyprexa for agitation 1/4 LP with IR, required intubation for procedure. Remains intubated, CCM consulted  1/5 LP consistent with bacterial meningitis: MRI brain Diffuse leptomeningeal enhancement involving both cerebral hemispheres, highly suggestive of acute meningitis. Extensive paranasal sinus disease with large bilateral mastoid While these findings are of unknown acuity, possible acute sinusitis and/or mastoiditis could be present, and could potentially serve as a source for seeding of CNS  infection 1/6 extubated.  Delirious postextubation requiring Precedex.  Neuro consulted for weakness with repeat MRI not showing significant changes  Interim History / Subjective:   Extubated, delirious on Precedex Shouting out in the room  Objective   Blood pressure (!) 166/73, pulse (!) 52, temperature 99.2 F (37.3 C), temperature source Axillary, resp. rate (!) 25, height 5\' 6"  (1.676 m), weight 117.3 kg, last menstrual period 11/02/2013, SpO2 99 %.    Vent Mode: PSV;CPAP FiO2 (%):  [40 %] 40 % PEEP:  [5 cmH20] 5 cmH20 Pressure Support:  [5 cmH20] 5 cmH20   Intake/Output Summary (Last 24 hours) at 06/15/2021 0955 Last data filed at 06/15/2021 0800 Gross per 24 hour  Intake 1955.73 ml  Output 625 ml  Net 1330.73 ml   Filed Weights   06/11/21 1422 06/12/21 1644 06/14/21 0339  Weight: 124.7 kg 118.5 kg 117.3 kg    Examination: Gen:      No acute distress HEENT:  EOMI, sclera anicteric Neck:     No masses; no thyromegaly Lungs:    Clear to auscultation bilaterally; normal respiratory effort CV:         Regular rate and rhythm; no murmurs Abd:      + bowel sounds; soft, non-tender; no palpable masses, no distension Ext:    No edema; adequate peripheral perfusion Skin:      Warm and dry; no rash Neuro: Awake, confused  Labs/Imaging reviewed Significant for glucose 370 Metabolic panel and CBC stable except for platelets 148 No new  Resolved Hospital Problem list   Hyponatremia  Hypokalemia Ventilator dependence  Assessment & Plan:  Severe sepsis present on admission due to bacterial meningitis Bacterial meningitis  in the setting of known CSF leak  H influenza bacteremia P: -ID and neuro following: appreciate recs -continue rocephin for H influenza and flagyl for possible mastoiditis  Acute delirium, metabolic encephalopathy Hx of depression  P: Continue Precedex, wean as tolerated Resume home Zoloft, Wellbutrin Ambien 10 at night to enable sleep  Hx of type 2  diabetes -Home medications include Victoza, metformin, and lantus  P: SSI coverage  Hx of HTN/HLD -Home medications include ASA, Lisinopril,  P: Continue aspirin Holding blood pressure medicine  Pressure Injury 06/12/21 Sacrum Mid Stage 1, present on admission  P: - monitor, wound are consult if needed  Best Practice (right click and "Reselect all SmartList Selections" daily)   Diet/type: NPO DVT prophylaxis: LMWH GI prophylaxis: PPI Lines: N/A Foley:  N/A Code Status:  full code Last date of multidisciplinary goals of care discussion: 1/6 spoke with husband at bedside   Critical care time:    The patient is critically ill with multiple organ system failure and requires high complexity decision making for assessment and support, frequent evaluation and titration of therapies, advanced monitoring, review of radiographic studies and interpretation of complex data.   Critical Care Time devoted to patient care services, exclusive of separately billable procedures, described in this note is 35 minutes.   Marshell Garfinkel MD [facility] See Amion for pager  If no response to pager , please call [phone] until 7pm After 7:00 pm call Elink  [phone] 06/15/2021, 10:14 AM

## 2021-06-16 DIAGNOSIS — G9341 Metabolic encephalopathy: Secondary | ICD-10-CM | POA: Diagnosis not present

## 2021-06-16 DIAGNOSIS — R569 Unspecified convulsions: Secondary | ICD-10-CM

## 2021-06-16 DIAGNOSIS — R531 Weakness: Secondary | ICD-10-CM | POA: Diagnosis not present

## 2021-06-16 DIAGNOSIS — G9600 Cerebrospinal fluid leak, unspecified: Secondary | ICD-10-CM | POA: Diagnosis not present

## 2021-06-16 DIAGNOSIS — G9340 Encephalopathy, unspecified: Secondary | ICD-10-CM | POA: Diagnosis not present

## 2021-06-16 DIAGNOSIS — G009 Bacterial meningitis, unspecified: Secondary | ICD-10-CM | POA: Diagnosis not present

## 2021-06-16 LAB — CULTURE, BLOOD (ROUTINE X 2): Culture: NO GROWTH

## 2021-06-16 LAB — CBC
HCT: 33.8 % — ABNORMAL LOW (ref 36.0–46.0)
Hemoglobin: 11.5 g/dL — ABNORMAL LOW (ref 12.0–15.0)
MCH: 31.5 pg (ref 26.0–34.0)
MCHC: 34 g/dL (ref 30.0–36.0)
MCV: 92.6 fL (ref 80.0–100.0)
Platelets: 295 10*3/uL (ref 150–400)
RBC: 3.65 MIL/uL — ABNORMAL LOW (ref 3.87–5.11)
RDW: 13.4 % (ref 11.5–15.5)
WBC: 10.7 10*3/uL — ABNORMAL HIGH (ref 4.0–10.5)
nRBC: 0 % (ref 0.0–0.2)

## 2021-06-16 LAB — GLUCOSE, CAPILLARY
Glucose-Capillary: 111 mg/dL — ABNORMAL HIGH (ref 70–99)
Glucose-Capillary: 112 mg/dL — ABNORMAL HIGH (ref 70–99)
Glucose-Capillary: 116 mg/dL — ABNORMAL HIGH (ref 70–99)
Glucose-Capillary: 130 mg/dL — ABNORMAL HIGH (ref 70–99)
Glucose-Capillary: 142 mg/dL — ABNORMAL HIGH (ref 70–99)
Glucose-Capillary: 143 mg/dL — ABNORMAL HIGH (ref 70–99)
Glucose-Capillary: 98 mg/dL (ref 70–99)

## 2021-06-16 LAB — BASIC METABOLIC PANEL
Anion gap: 11 (ref 5–15)
Anion gap: 14 (ref 5–15)
BUN: 20 mg/dL (ref 8–23)
BUN: 17 mg/dL (ref 8–23)
CO2: 18 mmol/L — ABNORMAL LOW (ref 22–32)
CO2: 21 mmol/L — ABNORMAL LOW (ref 22–32)
Calcium: 8.3 mg/dL — ABNORMAL LOW (ref 8.9–10.3)
Calcium: 8.4 mg/dL — ABNORMAL LOW (ref 8.9–10.3)
Chloride: 113 mmol/L — ABNORMAL HIGH (ref 98–111)
Chloride: 107 mmol/L (ref 98–111)
Creatinine, Ser: 0.79 mg/dL (ref 0.44–1.00)
Creatinine, Ser: 0.73 mg/dL (ref 0.44–1.00)
GFR, Estimated: >60 mL/min (ref >60)
GFR, Estimated: >60 mL/min (ref >60)
Glucose, Bld: 134 mg/dL — ABNORMAL HIGH (ref 70–99)
Glucose, Bld: 95 mg/dL (ref 70–99)
Potassium: 5.9 mmol/L — ABNORMAL HIGH (ref 3.5–5.1)
Potassium: 3.1 mmol/L — ABNORMAL LOW (ref 3.5–5.1)
Sodium: 142 mmol/L (ref 135–145)
Sodium: 142 mmol/L (ref 135–145)

## 2021-06-16 LAB — MAGNESIUM: Magnesium: 2.1 mg/dL (ref 1.7–2.4)

## 2021-06-16 LAB — PHOSPHORUS: Phosphorus: 3.6 mg/dL (ref 2.5–4.6)

## 2021-06-16 MED ORDER — DICLOFENAC SODIUM 1 % EX GEL
4.0000 g | Freq: Four times a day (QID) | CUTANEOUS | Status: DC | PRN
  Administered 2021-06-16 – 2021-06-18 (×2): 4 g via TOPICAL
  Filled 2021-06-16: qty 100

## 2021-06-16 MED ORDER — PANTOPRAZOLE SODIUM 40 MG PO TBEC
40.0000 mg | DELAYED_RELEASE_TABLET | Freq: Every day | ORAL | Status: DC
  Administered 2021-06-16 – 2021-06-19 (×4): 40 mg via ORAL
  Filled 2021-06-16 (×4): qty 1

## 2021-06-16 MED ORDER — ZOLPIDEM TARTRATE 5 MG PO TABS
5.0000 mg | ORAL_TABLET | Freq: Every evening | ORAL | Status: DC | PRN
  Administered 2021-06-16: 5 mg via ORAL
  Filled 2021-06-16: qty 1

## 2021-06-16 MED ORDER — ASPIRIN 81 MG PO CHEW
81.0000 mg | CHEWABLE_TABLET | Freq: Every day | ORAL | Status: DC
  Administered 2021-06-17 – 2021-06-18 (×2): 81 mg via ORAL
  Filled 2021-06-16 (×2): qty 1

## 2021-06-16 MED ORDER — POTASSIUM CHLORIDE 10 MEQ/100ML IV SOLN
10.0000 meq | INTRAVENOUS | Status: AC
  Administered 2021-06-16 – 2021-06-17 (×4): 10 meq via INTRAVENOUS
  Filled 2021-06-16 (×4): qty 100

## 2021-06-16 MED ORDER — INSULIN ASPART 100 UNIT/ML IJ SOLN
0.0000 [IU] | Freq: Three times a day (TID) | INTRAMUSCULAR | Status: DC
  Administered 2021-06-17 – 2021-06-18 (×5): 2 [IU] via SUBCUTANEOUS
  Administered 2021-06-19: 3 [IU] via SUBCUTANEOUS
  Administered 2021-06-19 – 2021-06-20 (×2): 2 [IU] via SUBCUTANEOUS
  Administered 2021-06-20: 3 [IU] via SUBCUTANEOUS

## 2021-06-16 MED ORDER — BUPROPION HCL ER (XL) 150 MG PO TB24
300.0000 mg | ORAL_TABLET | Freq: Every day | ORAL | Status: DC
  Administered 2021-06-16 – 2021-06-20 (×5): 300 mg via ORAL
  Filled 2021-06-16 (×2): qty 2
  Filled 2021-06-16 (×2): qty 1
  Filled 2021-06-16: qty 2

## 2021-06-16 MED ORDER — POTASSIUM CHLORIDE CRYS ER 20 MEQ PO TBCR
20.0000 meq | EXTENDED_RELEASE_TABLET | ORAL | Status: AC
  Administered 2021-06-16 – 2021-06-17 (×2): 20 meq via ORAL
  Filled 2021-06-16 (×2): qty 1

## 2021-06-16 MED ORDER — DOCUSATE SODIUM 100 MG PO CAPS
100.0000 mg | ORAL_CAPSULE | Freq: Two times a day (BID) | ORAL | Status: DC
  Administered 2021-06-16: 100 mg via ORAL
  Filled 2021-06-16 (×3): qty 1

## 2021-06-16 MED ORDER — POLYETHYLENE GLYCOL 3350 17 G PO PACK
17.0000 g | PACK | Freq: Every day | ORAL | Status: DC
  Filled 2021-06-16: qty 1

## 2021-06-16 MED ORDER — SERTRALINE HCL 100 MG PO TABS
100.0000 mg | ORAL_TABLET | Freq: Every day | ORAL | Status: DC
  Administered 2021-06-17 – 2021-06-20 (×4): 100 mg via ORAL
  Filled 2021-06-16: qty 1
  Filled 2021-06-16: qty 2
  Filled 2021-06-16 (×2): qty 1

## 2021-06-16 MED ORDER — OXYCODONE HCL 5 MG PO TABS
5.0000 mg | ORAL_TABLET | Freq: Four times a day (QID) | ORAL | Status: DC | PRN
  Administered 2021-06-16 – 2021-06-20 (×9): 5 mg via ORAL
  Filled 2021-06-16 (×9): qty 1

## 2021-06-16 NOTE — Progress Notes (Signed)
LTM discontinue at bedside now per Dr. Quinn Axe. No skin breakdown seen. Results pending

## 2021-06-16 NOTE — Progress Notes (Signed)
NAME:  Lisa [name], MRN:  [id number], DOB:  1961-05-09, LOS: 5 ADMISSION DATE:  06/11/2021, CONSULTATION DATE:  06/12/20 REFERRING MD:  Collene Gobble - IMTS, CHIEF COMPLAINT:  Altered mental status // Reason for consult: ventilator management   History of Present Illness:  61 yo F PMH temporal bone abnormality with associated intermitted CSF leak presented to [facility] 1/3 with AMS and fever. Intermittent CSF leak x 15 years, associated HA for about 2 days after each CSF leak episode. LP was attempted x3 in ED without success. Admitted to IMTS for suspected bacterial meningitis. She was started on vanc, rocephin, ampicillin. he also exhibited agitated delirium and was given Haldol and zyprexa.  On 1/4 the patient went to IR for LP and required intubation for the procedure. She remains intubated after the procedure. LP yielded 48ml cloudy CSF fluid which was sent for analysis   PCCM is consulted in this setting   Admitting labs reveal leukocytosis WBC 14.2 remaining CBC WNL. Metabolic abnormalities include Na 133 Glu 210 Ca 8.7  Pertinent  Medical History  Temporal bone abnormality CSF leaks HTN HLD DM2 Chronic pain  Rectocele  Depression  Sleep apnea Hepatic steatosis   Significant Hospital Events: Including procedures, antibiotic start and stop dates in addition to other pertinent events   1/3 admitted to IMTS for suspected bacterial meningitis. LP attempt x3 in ED without success. Haldol, zyprexa for agitation 1/4 LP with IR, required intubation for procedure. Remains intubated, CCM consulted  1/5 LP consistent with bacterial meningitis: MRI brain Diffuse leptomeningeal enhancement involving both cerebral hemispheres, highly suggestive of acute meningitis. Extensive paranasal sinus disease with large bilateral mastoid While these findings are of unknown acuity, possible acute sinusitis and/or mastoiditis could be present, and could potentially serve as a source for seeding of CNS  infection 1/6 extubated.  Delirious postextubation requiring Precedex.  Neuro consulted for weakness with repeat MRI not showing significant changes  Interim History / Subjective:   Mental status much better today.  She is more awake. Continues on Precedex  Objective   Blood pressure (!) 155/70, pulse (!) 41, temperature 99.8 F (37.7 C), temperature source Axillary, resp. rate 17, height 5\' 6"  (1.676 m), weight 117.3 kg, last menstrual period 11/02/2013, SpO2 98 %.        Intake/Output Summary (Last 24 hours) at 06/16/2021 0956 Last data filed at 06/16/2021 6808 Gross per 24 hour  Intake 2719.84 ml  Output 750 ml  Net 1969.84 ml   Filed Weights   06/11/21 1422 06/12/21 1644 06/14/21 0339  Weight: 124.7 kg 118.5 kg 117.3 kg    Examination: Gen:      No acute distress HEENT:  EOMI, sclera anicteric Neck:     No masses; no thyromegaly Lungs:    Clear to auscultation bilaterally; normal respiratory effort CV:         Regular rate and rhythm; no murmurs Abd:      + bowel sounds; soft, non-tender; no palpable masses, no distension Ext:    No edema; adequate peripheral perfusion Skin:      Warm and dry; no rash Neuro: Awake, responsive  Labs/Imaging reviewed Potassium 5.9, CBC is stable No new imaging  Resolved Hospital Problem list   Hyponatremia  Hypokalemia Ventilator dependence  Assessment & Plan:  Severe sepsis present on admission due to bacterial meningitis Bacterial meningitis in the setting of known CSF leak  H influenza bacteremia P: ID and neuro following: appreciate recs Continue rocephin for H influenza and flagyl  for possible mastoiditis Will need MRI at some point there is concern that she may develop abscess EEG monitoring  Acute delirium, metabolic encephalopathy Hx of depression  P: Weaning Precedex Resume home Zoloft, Wellbutrin Ambien 10 at night to enable sleep  Hx of type 2 diabetes -Home medications include Victoza, metformin, and lantus   P: SSI coverage  Hx of HTN/HLD -Home medications include ASA, Lisinopril,  P: Continue aspirin Holding blood pressure medicine  Hyperkalemia P: Lab is hemolyzed Recheck BMP  Pressure Injury 06/12/21 Sacrum Mid Stage 1, present on admission  P: - monitor, wound are consult if needed  Best Practice (right click and "Reselect all SmartList Selections" daily)   Diet/type: NPO DVT prophylaxis: LMWH GI prophylaxis: PPI Lines: N/A Foley:  N/A Code Status:  full code Last date of multidisciplinary goals of care discussion: 1/8 spoke with husband at bedside   Critical care time:    The patient is critically ill with multiple organ system failure and requires high complexity decision making for assessment and support, frequent evaluation and titration of therapies, advanced monitoring, review of radiographic studies and interpretation of complex data.   Critical Care Time devoted to patient care services, exclusive of separately billable procedures, described in this note is 35 minutes.   Marshell Garfinkel MD  Pulmonary & Critical care See Amion for pager  If no response to pager , please call [phone] until 7pm After 7:00 pm call Elink  [phone] 06/16/2021, 9:56 AM

## 2021-06-16 NOTE — Progress Notes (Signed)
Subjective:  Patient's headache has  improved  Antibiotics:  Anti-infectives (From admission, onward)    Start     Dose/Rate Route Frequency Ordered Stop   06/13/21 1015  metroNIDAZOLE (FLAGYL) IVPB 500 mg        500 mg 100 mL/hr over 60 Minutes Intravenous Every 8 hours 06/13/21 0929     06/12/21 1630  vancomycin (VANCOREADY) IVPB 1750 mg/350 mL  Status:  Discontinued        1,750 mg 175 mL/hr over 120 Minutes Intravenous Every 24 hours 06/11/21 1955 06/11/21 2000   06/12/21 1630  vancomycin (VANCOREADY) IVPB 1500 mg/300 mL  Status:  Discontinued        1,500 mg 150 mL/hr over 120 Minutes Intravenous Every 24 hours 06/11/21 2000 06/11/21 2132   06/12/21 0600  vancomycin (VANCOCIN) IVPB 1000 mg/200 mL premix  Status:  Discontinued        1,000 mg 200 mL/hr over 60 Minutes Intravenous Every 12 hours 06/11/21 2132 06/13/21 1127   06/11/21 2300  ceFEPIme (MAXIPIME) 2 g in sodium chloride 0.9 % 100 mL IVPB  Status:  Discontinued        2 g 200 mL/hr over 30 Minutes Intravenous Every 8 hours 06/11/21 1955 06/11/21 2123   06/11/21 2215  ampicillin (OMNIPEN) 2 g in sodium chloride 0.9 % 100 mL IVPB  Status:  Discontinued        2 g 300 mL/hr over 20 Minutes Intravenous Every 4 hours 06/11/21 2123 06/13/21 0929   06/11/21 2200  cefTRIAXone (ROCEPHIN) 2 g in sodium chloride 0.9 % 100 mL IVPB        2 g 200 mL/hr over 30 Minutes Intravenous Every 12 hours 06/11/21 2123 06/25/21 2159   06/11/21 1445  vancomycin (VANCOREADY) IVPB 2000 mg/400 mL        2,000 mg 200 mL/hr over 120 Minutes Intravenous  Once 06/11/21 1437 06/11/21 1843   06/11/21 1430  ceFEPIme (MAXIPIME) 2 g in sodium chloride 0.9 % 100 mL IVPB        2 g 200 mL/hr over 30 Minutes Intravenous  Once 06/11/21 1424 06/11/21 1627   06/11/21 1430  metroNIDAZOLE (FLAGYL) IVPB 500 mg        500 mg 100 mL/hr over 60 Minutes Intravenous  Once 06/11/21 1424 06/11/21 1627   06/11/21 1430  vancomycin (VANCOCIN) IVPB 1000  mg/200 mL premix  Status:  Discontinued        1,000 mg 200 mL/hr over 60 Minutes Intravenous  Once 06/11/21 1424 06/11/21 1437       Medications: Scheduled Meds:  aspirin  81 mg Per Tube Daily   Chlorhexidine Gluconate Cloth  6 each Topical Daily   docusate  100 mg Per Tube BID   enoxaparin (LOVENOX) injection  60 mg Subcutaneous Q24H   insulin aspart  0-15 Units Subcutaneous Q4H   pantoprazole (PROTONIX) IV  40 mg Intravenous QHS   polyethylene glycol  17 g Per Tube Daily   sertraline  100 mg Per Tube Daily   sodium chloride flush  3 mL Intravenous Q12H   Continuous Infusions:  cefTRIAXone (ROCEPHIN)  IV Stopped (06/16/21 0947)   fentaNYL infusion INTRAVENOUS Stopped (06/14/21 1028)   lactated ringers 75 mL/hr at 06/16/21 1100   metronidazole 100 mL/hr at 06/16/21 1100   PRN Meds:.acetaminophen **OR** acetaminophen, diclofenac Sodium, fentaNYL, hydrALAZINE, midazolam, oxyCODONE    Objective: Weight change:   Intake/Output Summary (Last 24 hours) at 06/16/2021 1439  Last data filed at 06/16/2021 1100 Gross per 24 hour  Intake 2366.75 ml  Output 300 ml  Net 2066.75 ml    Blood pressure 119/73, pulse (!) 52, temperature 98.2 F (36.8 C), temperature source Axillary, resp. rate (!) 22, height 5\' 6"  (1.676 m), weight 117.3 kg, last menstrual period 11/02/2013, SpO2 96 %. Temp:  [98 F (36.7 C)-99.8 F (37.7 C)] 98.2 F (36.8 C) (01/08 1200) Pulse Rate:  [41-66] 52 (01/08 1100) Resp:  [14-31] 22 (01/08 1100) BP: (119-176)/(57-85) 119/73 (01/08 1100) SpO2:  [92 %-99 %] 96 % (01/08 1100)  Physical Exam: Physical Exam Constitutional:      General: She is not in acute distress.    Appearance: She is well-developed. She is obese. She is not diaphoretic.  HENT:     Head: Normocephalic and atraumatic.     Right Ear: External ear normal.     Left Ear: External ear normal.     Mouth/Throat:     Pharynx: No oropharyngeal exudate.  Eyes:     General: No scleral icterus.     Conjunctiva/sclera: Conjunctivae normal.     Pupils: Pupils are equal, round, and reactive to light.  Cardiovascular:     Rate and Rhythm: Normal rate and regular rhythm.     Heart sounds: Normal heart sounds. No murmur heard.   No friction rub. No gallop.  Pulmonary:     Effort: Pulmonary effort is normal. No respiratory distress.     Breath sounds: Normal breath sounds. No wheezing.  Abdominal:     General: Bowel sounds are normal. There is no distension.     Palpations: Abdomen is soft.     Tenderness: There is no abdominal tenderness. There is no rebound.  Musculoskeletal:        General: No tenderness. Normal range of motion.  Lymphadenopathy:     Cervical: No cervical adenopathy.  Skin:    General: Skin is warm and dry.     Coloration: Skin is not pale.     Findings: No erythema or rash.  Neurological:     General: No focal deficit present.     Mental Status: She is alert and oriented to person, place, and time.     Motor: No abnormal muscle tone.  Psychiatric:        Mood and Affect: Mood normal.        Behavior: Behavior normal.        Thought Content: Thought content normal.        Judgment: Judgment normal.    Her upper extremity strength is 5/5 bilaterally with right sided somewhat limited by some chronic shoulder pain which makes it more difficult for her to have a strong grip there.  CBC:    BMET Recent Labs    06/15/21 0536 06/16/21 0638  NA 142 142  K 4.4 5.9*  CL 110 113*  CO2 22 18*  GLUCOSE 131* 134*  BUN 19 20  CREATININE 0.53 0.79  CALCIUM 8.6* 8.3*      Liver Panel  No results for input(s): PROT, ALBUMIN, AST, ALT, ALKPHOS, BILITOT, BILIDIR, IBILI in the last 72 hours.      Sedimentation Rate No results for input(s): ESRSEDRATE in the last 72 hours. C-Reactive Protein No results for input(s): CRP in the last 72 hours.  Micro Results: Recent Results (from the past 720 hour(s))  Culture, blood (Routine x 2)     Status: None    Collection Time: 06/11/21  2:23 PM  Specimen: BLOOD LEFT HAND  Result Value Ref Range Status   Specimen Description BLOOD LEFT HAND  Final   Special Requests   Final    BOTTLES DRAWN AEROBIC ONLY Blood Culture results may not be optimal due to an inadequate volume of blood received in culture bottles   Culture   Final    NO GROWTH 5 DAYS Performed at [facility], [place] [address] [place], [place] [address]    Report Status 06/16/2021 FINAL  Final  Urine Culture     Status: None   Collection Time: 06/11/21  2:24 PM   Specimen: In/Out Cath Urine  Result Value Ref Range Status   Specimen Description IN/OUT CATH URINE  Final   Special Requests NONE  Final   Culture   Final    NO GROWTH Performed at [facility], [place] [address] [place], [place] [address]    Report Status 06/12/2021 FINAL  Final  Resp Panel by RT-PCR (Flu A&B, Covid) Nasopharyngeal Swab     Status: None   Collection Time: 06/11/21  2:51 PM   Specimen: Nasopharyngeal Swab; Nasopharyngeal(NP) swabs in vial transport medium  Result Value Ref Range Status   SARS Coronavirus 2 by RT PCR NEGATIVE NEGATIVE Final    Comment: (NOTE) SARS-CoV-2 target nucleic acids are NOT DETECTED.  The SARS-CoV-2 RNA is generally detectable in upper respiratory specimens during the acute phase of infection. The lowest concentration of SARS-CoV-2 viral copies this assay can detect is 138 copies/mL. A negative result does not preclude SARS-Cov-2 infection and should not be used as the sole basis for treatment or other patient management decisions. A negative result may occur with  improper specimen collection/handling, submission of specimen other than nasopharyngeal swab, presence of viral mutation(s) within the areas targeted by this assay, and inadequate number of viral copies(<138 copies/mL). A negative result must be combined with clinical observations, patient history, and epidemiological information. The expected result  is Negative.  Fact Sheet for Patients:  EntrepreneurPulse.com.au  Fact Sheet for Healthcare Providers:  IncredibleEmployment.be  This test is no t yet approved or cleared by the [place] FDA and  has been authorized for detection and/or diagnosis of SARS-CoV-2 by FDA under an Emergency Use Authorization (EUA). This EUA will remain  in effect (meaning this test can be used) for the duration of the COVID-19 declaration under Section 564(b)(1) of the Act, 21 U.S.C.section 360bbb-3(b)(1), unless the authorization is terminated  or revoked sooner.       Influenza A by PCR NEGATIVE NEGATIVE Final   Influenza B by PCR NEGATIVE NEGATIVE Final    Comment: (NOTE) The Xpert Xpress SARS-CoV-2/FLU/RSV plus assay is intended as an aid in the diagnosis of influenza from Nasopharyngeal swab specimens and should not be used as a sole basis for treatment. Nasal washings and aspirates are unacceptable for Xpert Xpress SARS-CoV-2/FLU/RSV testing.  Fact Sheet for Patients: EntrepreneurPulse.com.au  Fact Sheet for Healthcare Providers: IncredibleEmployment.be  This test is not yet approved or cleared by the [place] FDA and has been authorized for detection and/or diagnosis of SARS-CoV-2 by FDA under an Emergency Use Authorization (EUA). This EUA will remain in effect (meaning this test can be used) for the duration of the COVID-19 declaration under Section 564(b)(1) of the Act, 21 U.S.C. section 360bbb-3(b)(1), unless the authorization is terminated or revoked.  Performed at [facility], [place] [address], [place], [place] [address]   Culture, blood (Routine x 2)  Status: Abnormal (Preliminary result)   Collection Time: 06/11/21  3:00 PM   Specimen: BLOOD RIGHT ARM  Result Value Ref Range Status   Specimen Description BLOOD RIGHT ARM  Final   Special Requests   Final    BOTTLES DRAWN AEROBIC AND  ANAEROBIC Blood Culture adequate volume   Culture (A)  Final    HAEMOPHILUS INFLUENZAE BETA LACTAMASE POSITIVE CRITICAL RESULT CALLED TO, READ BACK BY AND VERIFIED WITH: PHARMD J.FRENS AT 0630 ON 06/13/2021 BY T.SAAD. HEALTH DEPARTMENT NOTIFIED Referred to [facility] in [place], [place] for serotyping. Performed at [facility], [place] [address] [place], [place] [address]    Report Status PENDING  Incomplete  CSF culture w Stat Gram Stain     Status: None   Collection Time: 06/12/21  3:40 PM   Specimen: CSF; Cerebrospinal Fluid  Result Value Ref Range Status   Specimen Description CSF  Final   Special Requests Normal  Final   Gram Stain   Final    ABUNDANT WBC PRESENT,BOTH PMN AND MONONUCLEAR NO ORGANISMS SEEN    Culture   Final    NO GROWTH 3 DAYS Performed at [facility], [place] [address] [place], [place] [address]    Report Status 06/15/2021 FINAL  Final  MRSA Next Gen by PCR, Nasal     Status: None   Collection Time: 06/12/21  4:54 PM   Specimen: Nasal Mucosa; Nasal Swab  Result Value Ref Range Status   MRSA by PCR Next Gen NOT DETECTED NOT DETECTED Final    Comment: (NOTE) The GeneXpert MRSA Assay (FDA approved for NASAL specimens only), is one component of a comprehensive MRSA colonization surveillance program. It is not intended to diagnose MRSA infection nor to guide or monitor treatment for MRSA infections. Test performance is not FDA approved in patients less than 62 years old. Performed at [facility], [place] [address], [place], [place] [address]     Studies/Results: MR BRAIN WO CONTRAST  Result Date: 06/14/2021 CLINICAL DATA:  Neuro deficit, acute, stroke suspected; new onset RUE and RLE weakness in patient with bacterial meningitis EXAM: MRI HEAD WITHOUT CONTRAST TECHNIQUE: Multiplanar, multiecho pulse sequences of the brain and surrounding structures were obtained without intravenous contrast. COMPARISON:  06/13/2021 FINDINGS: Only axial  and coronal DWI sequences were obtained. Patient could not continue with remainder of study. Suspect trace abnormal diffusion signal in the dependent occipital horns and dependent fourth ventricle on the left. No acute infarction. Ventricles are stable in size without hydrocephalus. No parenchymal edema. IMPRESSION: Single sequence study.  Patient could not continue with remainder. Suspect minimal layering debris in the ventricles suggesting ventriculitis. No hydrocephalus. No parenchymal lesion or edema. Electronically Signed   By: Macy Mis M.D.   On: 06/14/2021 16:03   Overnight EEG with video  Result Date: 06/16/2021 Lora Havens, MD     06/16/2021  9:21 AM Patient Name: Lisa [name] MRN: [id number] Epilepsy Attending: Lora Havens Referring Physician/Provider: Dr Su Monks Duration: 06/15/2021 1833 to 06/16/2021 0920 Patient history:  61 y.o. 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 EEG to evaluate for seizure  Level of alertness:  lethargic AEDs during EEG study: None Technical aspects: This EEG study was done with scalp electrodes positioned according to the 10-20 International system of electrode placement. Electrical activity was acquired at a sampling rate  of 500Hz  and reviewed with a high frequency filter of 70Hz  and a low frequency filter of 1Hz . EEG data were recorded continuously and digitally stored. Description: EEG showed continuous generalized predominantly 5 to 6 Hz theta slowing admixed with intermittent generalized 2 to 3 Hz delta slowing. Hyperventilation and photic stimulation were not performed.    ABNORMALITY - Continuous slow, generalized  IMPRESSION: This study is suggestive of moderate diffuse encephalopathy, nonspecific etiology. No seizures or epileptiform discharges were seen throughout the recording.  Priyanka Barbra Sarks        Assessment/Plan:  INTERVAL HISTORY: Patient's encephalopathy is cleared and she does not have a clear focal neurological deficit on my exam other than her right arm weakness that seems more related to chronic shoulder pain.  Principal Problem:   Acute bacterial meningitis Active Problems:   Diabetes mellitus, type 2 (HCC)   CSF leak   Acute metabolic encephalopathy   Altered mental status   Pressure injury of skin   Sepsis due to Haemophilus influenzae with acute hypoxic respiratory failure and septic shock (HCC)   Fever   Bacterial meningitis   Right sided weakness    Lisa [name] is a 61 y.o. 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  She has had recent right-sided weakness but MRI was not capable of being done yesterday.  #1  Haemophilus influenza bacteremia and meningitis  Continue high-dose ceftriaxone 2 g IV every 12 hours.  I would like to get an MRI towards the end of her IV therapy to ensure that she does not have a brain abscess that is developed that would require potential neurosurgical intervention and protracted IV therapy.  #2  Encephalopathy: Likely multifactorial and is resolved  #3  Right-sided weakness: Appears to be more related to her shoulder pathology when talking with the patient today.   #4 CSF leak: Again will need to be addressed with neurosurgery when she is stable and cleared her CNS infection  I spent 36 minutes with the patient including than 50% of the time in face to face counseling of the patient and her husband regarding the nature of bacterial meningitis the nature of haemophilus influenza specifically, CSF leaks personally reviewing chest x-ray recent MRI updated culture CBC BMP along with review of medical records in preparation for the visit and during the visit and in  coordination of her care.    LOS: 5 days   Alcide Evener 06/16/2021, 2:39 PM

## 2021-06-16 NOTE — Progress Notes (Signed)
LTM maintain done at bedside. No skin breakdown noted. Results pending ° °

## 2021-06-16 NOTE — Progress Notes (Signed)
Speech Language Pathology Treatment: Dysphagia  Patient Details Name: Lisa [name] MRN: [id number] DOB: [birth year] Today's Date: 06/16/2021 Time: 1212-1227 SLP Time Calculation (min) (ACUTE ONLY): 15 min  Assessment / Plan / Recommendation Clinical Impression  Pt was seen for dysphagia treatment with her husband present who reported that the pt occasionally demonstrates throat clearing with p.o. intake. Pt tolerated puree solids, dysphagia 3 solids and dual consistency boluses (i.e., pears with juice) without overt s/sx of aspiration. A single throat clear was noted after intake of four consecutive swallows of thin liquids, but not with individual boluses via straw. Mastication and bolus manipulation were mildly prolonged with dysphagia 3 solids which pt and her husband reported are not baseline. Pt stated that she is fearful of choking and would like to "start slow" with foods softer than pears. After discussion, it was agreed that a full liquid diet will be initiated and that SLP will follow for further advancement.    HPI HPI: Pt is a 61 y/o female who presented to the ED on 06/11/21 for AMS after flu like symptoms. Head CT negative. Lumbar puncture on 1/4. CSF findings highly suspicious for bacterial meningitis. Intubated 1/4-1/6. After extubation, patient with mild R sided weakness. MRI with suspect minimal layering debris in ventricles suggesting ventriculitis but study limited by agitation and movement. 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  Recommendations may be updated based on patient status, additional functional criteria and insurance authorization.    Recommendations  Diet recommendations: Thin liquid (full liquids) Liquids  provided via: Cup;Straw Medication Administration: Whole meds with puree Supervision: Patient able to self feed Compensations: Slow rate;Small sips/bites Postural Changes and/or Swallow Maneuvers: Seated upright 90 degrees                Oral Care Recommendations: Oral care BID Follow Up Recommendations:  (TBD) SLP Visit Diagnosis: Dysphagia, unspecified (R13.10) Plan: Continue with current plan of care          Jarret Torre I. Hardin Negus, [place], [place] Office number [phone] Pager [place]  06/16/2021, 12:39 PM

## 2021-06-16 NOTE — Procedures (Addendum)
Patient Name: Lisa [name]  MRN: [id number]  Epilepsy Attending: Lora Havens  Referring Physician/Provider: Dr Su Monks Duration: 06/15/2021 1833 to 06/16/2021 1450  Patient history:  61 y.o. 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 EEG to evaluate for seizure   Level of alertness:  lethargic   AEDs during EEG study: None  Technical aspects: This EEG study was done with scalp electrodes positioned according to the 10-20 International system of electrode placement. Electrical activity was acquired at a sampling rate of 500Hz  and reviewed with a high frequency filter of 70Hz  and a low frequency filter of 1Hz . EEG data were recorded continuously and digitally stored.   Description: EEG showed continuous generalized predominantly 5 to 6 Hz theta slowing admixed with intermittent generalized 2 to 3 Hz delta slowing. Hyperventilation and photic stimulation were not performed.      ABNORMALITY - Continuous slow, generalized   IMPRESSION: This study is suggestive of moderate diffuse encephalopathy, nonspecific etiology. No seizures or epileptiform discharges were seen throughout the recording.   Corliss Coggeshall Barbra Sarks

## 2021-06-16 NOTE — Progress Notes (Signed)
[facility] ADULT ICU REPLACEMENT PROTOCOL   The patient does apply for the [facility] Adult ICU Electrolyte Replacment Protocol based on the criteria listed below:   1.Exclusion criteria: TCTS patients, ECMO patients, and Dialysis patients 2. Is GFR >/= 30 ml/min? Yes.    Patient's GFR today is > 60 3. Is SCr </= 2? Yes.   Patient's SCr is 0.73 mg/dL 4. Did SCr increase >/= 0.5 in 24 hours? No. 5.Pt's weight >40kg  Yes.   6. Abnormal electrolyte(s): K+ 3.1  7. Electrolytes replaced per protocol 8.  Call MD STAT for K+ </= 2.5, Phos </= 1, or Mag </= 1 Physician:  Dr Ortencia Kick, Elfredia Nevins 06/16/2021 9:29 PM

## 2021-06-17 DIAGNOSIS — G9600 Cerebrospinal fluid leak, unspecified: Secondary | ICD-10-CM | POA: Diagnosis not present

## 2021-06-17 DIAGNOSIS — G9341 Metabolic encephalopathy: Secondary | ICD-10-CM | POA: Diagnosis not present

## 2021-06-17 DIAGNOSIS — R531 Weakness: Secondary | ICD-10-CM | POA: Diagnosis not present

## 2021-06-17 DIAGNOSIS — G9340 Encephalopathy, unspecified: Secondary | ICD-10-CM | POA: Diagnosis not present

## 2021-06-17 DIAGNOSIS — G009 Bacterial meningitis, unspecified: Secondary | ICD-10-CM | POA: Diagnosis not present

## 2021-06-17 LAB — BASIC METABOLIC PANEL
Anion gap: 11 (ref 5–15)
BUN: 12 mg/dL (ref 8–23)
CO2: 23 mmol/L (ref 22–32)
Calcium: 8.1 mg/dL — ABNORMAL LOW (ref 8.9–10.3)
Chloride: 105 mmol/L (ref 98–111)
Creatinine, Ser: 0.75 mg/dL (ref 0.44–1.00)
GFR, Estimated: >60 mL/min (ref >60)
Glucose, Bld: 106 mg/dL — ABNORMAL HIGH (ref 70–99)
Potassium: 3.3 mmol/L — ABNORMAL LOW (ref 3.5–5.1)
Sodium: 139 mmol/L (ref 135–145)

## 2021-06-17 LAB — CBC
HCT: 34.3 % — ABNORMAL LOW (ref 36.0–46.0)
Hemoglobin: 11.1 g/dL — ABNORMAL LOW (ref 12.0–15.0)
MCH: 30.1 pg (ref 26.0–34.0)
MCHC: 32.4 g/dL (ref 30.0–36.0)
MCV: 93 fL (ref 80.0–100.0)
Platelets: 347 10*3/uL (ref 150–400)
RBC: 3.69 MIL/uL — ABNORMAL LOW (ref 3.87–5.11)
RDW: 13.2 % (ref 11.5–15.5)
WBC: 10.5 10*3/uL (ref 4.0–10.5)
nRBC: 0 % (ref 0.0–0.2)

## 2021-06-17 LAB — GLUCOSE, CAPILLARY
Glucose-Capillary: 105 mg/dL — ABNORMAL HIGH (ref 70–99)
Glucose-Capillary: 115 mg/dL — ABNORMAL HIGH (ref 70–99)
Glucose-Capillary: 117 mg/dL — ABNORMAL HIGH (ref 70–99)
Glucose-Capillary: 119 mg/dL — ABNORMAL HIGH (ref 70–99)
Glucose-Capillary: 124 mg/dL — ABNORMAL HIGH (ref 70–99)
Glucose-Capillary: 126 mg/dL — ABNORMAL HIGH (ref 70–99)
Glucose-Capillary: 129 mg/dL — ABNORMAL HIGH (ref 70–99)

## 2021-06-17 LAB — PHOSPHORUS: Phosphorus: 2.9 mg/dL (ref 2.5–4.6)

## 2021-06-17 LAB — MAGNESIUM: Magnesium: 1.8 mg/dL (ref 1.7–2.4)

## 2021-06-17 MED ORDER — ONDANSETRON 4 MG PO TBDP
4.0000 mg | ORAL_TABLET | Freq: Once | ORAL | Status: AC
  Administered 2021-06-17: 4 mg via ORAL
  Filled 2021-06-17: qty 1

## 2021-06-17 MED ORDER — ZOLPIDEM TARTRATE 5 MG PO TABS
5.0000 mg | ORAL_TABLET | Freq: Every evening | ORAL | Status: DC | PRN
  Administered 2021-06-17 – 2021-06-19 (×3): 5 mg via ORAL
  Filled 2021-06-17 (×3): qty 1

## 2021-06-17 MED ORDER — MELATONIN 3 MG PO TABS
3.0000 mg | ORAL_TABLET | Freq: Once | ORAL | Status: AC
  Administered 2021-06-17: 3 mg via ORAL
  Filled 2021-06-17: qty 1

## 2021-06-17 MED ORDER — NICOTINE 14 MG/24HR TD PT24
14.0000 mg | MEDICATED_PATCH | Freq: Every day | TRANSDERMAL | Status: DC
  Administered 2021-06-17 – 2021-06-20 (×4): 14 mg via TRANSDERMAL
  Filled 2021-06-17 (×5): qty 1

## 2021-06-17 MED ORDER — POTASSIUM CHLORIDE CRYS ER 20 MEQ PO TBCR
40.0000 meq | EXTENDED_RELEASE_TABLET | ORAL | Status: AC
  Administered 2021-06-17 (×2): 40 meq via ORAL
  Filled 2021-06-17 (×2): qty 2

## 2021-06-17 MED ORDER — ALPRAZOLAM 0.25 MG PO TABS: 0.2500 mg | ORAL_TABLET | Freq: Once | ORAL | Status: DC | PRN

## 2021-06-17 MED ORDER — ALPRAZOLAM 0.25 MG PO TABS
0.2500 mg | ORAL_TABLET | Freq: Two times a day (BID) | ORAL | Status: DC | PRN
  Administered 2021-06-17 – 2021-06-19 (×4): 0.25 mg via ORAL
  Filled 2021-06-17 (×4): qty 1

## 2021-06-17 MED ORDER — MAGNESIUM SULFATE 2 GM/50ML IV SOLN
2.0000 g | Freq: Once | INTRAVENOUS | Status: AC
  Administered 2021-06-17: 2 g via INTRAVENOUS
  Filled 2021-06-17: qty 50

## 2021-06-17 NOTE — Progress Notes (Signed)
Subjective:  Patient continued to have neck pain and headaches, pain in her right shoulder as well as pain in her right leg when she tries to move it--which she does so with difficulty  Antibiotics:  Anti-infectives (From admission, onward)    Start     Dose/Rate Route Frequency Ordered Stop   06/13/21 1015  metroNIDAZOLE (FLAGYL) IVPB 500 mg        500 mg 100 mL/hr over 60 Minutes Intravenous Every 8 hours 06/13/21 0929     06/12/21 1630  vancomycin (VANCOREADY) IVPB 1750 mg/350 mL  Status:  Discontinued        1,750 mg 175 mL/hr over 120 Minutes Intravenous Every 24 hours 06/11/21 1955 06/11/21 2000   06/12/21 1630  vancomycin (VANCOREADY) IVPB 1500 mg/300 mL  Status:  Discontinued        1,500 mg 150 mL/hr over 120 Minutes Intravenous Every 24 hours 06/11/21 2000 06/11/21 2132   06/12/21 0600  vancomycin (VANCOCIN) IVPB 1000 mg/200 mL premix  Status:  Discontinued        1,000 mg 200 mL/hr over 60 Minutes Intravenous Every 12 hours 06/11/21 2132 06/13/21 1127   06/11/21 2300  ceFEPIme (MAXIPIME) 2 g in sodium chloride 0.9 % 100 mL IVPB  Status:  Discontinued        2 g 200 mL/hr over 30 Minutes Intravenous Every 8 hours 06/11/21 1955 06/11/21 2123   06/11/21 2215  ampicillin (OMNIPEN) 2 g in sodium chloride 0.9 % 100 mL IVPB  Status:  Discontinued        2 g 300 mL/hr over 20 Minutes Intravenous Every 4 hours 06/11/21 2123 06/13/21 0929   06/11/21 2200  cefTRIAXone (ROCEPHIN) 2 g in sodium chloride 0.9 % 100 mL IVPB        2 g 200 mL/hr over 30 Minutes Intravenous Every 12 hours 06/11/21 2123 06/25/21 2159   06/11/21 1445  vancomycin (VANCOREADY) IVPB 2000 mg/400 mL        2,000 mg 200 mL/hr over 120 Minutes Intravenous  Once 06/11/21 1437 06/11/21 1843   06/11/21 1430  ceFEPIme (MAXIPIME) 2 g in sodium chloride 0.9 % 100 mL IVPB        2 g 200 mL/hr over 30 Minutes Intravenous  Once 06/11/21 1424 06/11/21 1627   06/11/21 1430  metroNIDAZOLE (FLAGYL) IVPB 500 mg         500 mg 100 mL/hr over 60 Minutes Intravenous  Once 06/11/21 1424 06/11/21 1627   06/11/21 1430  vancomycin (VANCOCIN) IVPB 1000 mg/200 mL premix  Status:  Discontinued        1,000 mg 200 mL/hr over 60 Minutes Intravenous  Once 06/11/21 1424 06/11/21 1437       Medications: Scheduled Meds:  aspirin  81 mg Oral Daily   buPROPion  300 mg Oral Daily   Chlorhexidine Gluconate Cloth  6 each Topical Daily   docusate sodium  100 mg Oral BID   enoxaparin (LOVENOX) injection  60 mg Subcutaneous Q24H   insulin aspart  0-15 Units Subcutaneous TID AC & HS   nicotine  14 mg Transdermal Q0600   pantoprazole  40 mg Oral QHS   polyethylene glycol  17 g Oral Daily   potassium chloride  40 mEq Oral Q4H   sertraline  100 mg Oral Daily   sodium chloride flush  3 mL Intravenous Q12H   Continuous Infusions:  cefTRIAXone (ROCEPHIN)  IV 200 mL/hr at 06/17/21 1100  lactated ringers 75 mL/hr at 06/17/21 1100   magnesium sulfate bolus IVPB     metronidazole Stopped (06/17/21 1028)   PRN Meds:.acetaminophen **OR** acetaminophen, ALPRAZolam, diclofenac Sodium, hydrALAZINE, oxyCODONE, zolpidem    Objective: Weight change:   Intake/Output Summary (Last 24 hours) at 06/17/2021 1123 Last data filed at 06/17/2021 1100 Gross per 24 hour  Intake 2671.06 ml  Output 1300 ml  Net 1371.06 ml    Blood pressure 140/76, pulse 72, temperature 98.1 F (36.7 C), temperature source Oral, resp. rate (!) 24, height 5\' 6"  (1.676 m), weight 117 kg, last menstrual period 11/02/2013, SpO2 95 %. Temp:  [98.1 F (36.7 C)-98.5 F (36.9 C)] 98.1 F (36.7 C) (01/09 0800) Pulse Rate:  [69-79] 72 (01/09 1100) Resp:  [13-37] 24 (01/09 1100) BP: (96-145)/(52-86) 140/76 (01/09 1100) SpO2:  [94 %-98 %] 95 % (01/09 1100) Weight:  [117 kg] 117 kg (01/09 0500)  Physical Exam: Physical Exam Constitutional:      General: She is not in acute distress.    Appearance: She is well-developed. She is not diaphoretic.  HENT:      Head: Normocephalic and atraumatic.     Right Ear: External ear normal.     Left Ear: External ear normal.     Mouth/Throat:     Pharynx: No oropharyngeal exudate.  Eyes:     General: No scleral icterus.    Conjunctiva/sclera: Conjunctivae normal.     Pupils: Pupils are equal, round, and reactive to light.  Cardiovascular:     Rate and Rhythm: Normal rate and regular rhythm.  Pulmonary:     Effort: Pulmonary effort is normal. No respiratory distress.     Breath sounds: No wheezing.  Abdominal:     General: Bowel sounds are normal. There is no distension.     Palpations: Abdomen is soft.     Tenderness: There is no abdominal tenderness. There is no rebound.  Musculoskeletal:     Right shoulder: Tenderness present. Decreased range of motion.  Lymphadenopathy:     Cervical: No cervical adenopathy.  Skin:    General: Skin is warm and dry.     Coloration: Skin is not pale.     Findings: No erythema or rash.  Neurological:     Mental Status: She is alert and oriented to person, place, and time.     Motor: No abnormal muscle tone.     Coordination: Coordination normal.     Comments: She has weakness in her right lower foot and leg which patient and husband say preceded her admission   Psychiatric:        Attention and Perception: Attention normal.        Mood and Affect: Mood is depressed.        Behavior: Behavior normal.        Thought Content: Thought content normal.        Judgment: Judgment normal.    Her upper extremity strength is 5/5 bilaterally with right sided somewhat limited by some chronic shoulder pain which makes it more difficult for her to have a strong grip there.  CBC:    BMET Recent Labs    06/16/21 1515 06/17/21 0435  NA 142 139  K 3.1* 3.3*  CL 107 105  CO2 21* 23  GLUCOSE 95 106*  BUN 17 12  CREATININE 0.73 0.75  CALCIUM 8.4* 8.1*      Liver Panel  No results for input(s): PROT, ALBUMIN, AST, ALT, ALKPHOS, BILITOT, BILIDIR, IBILI in  the  last 72 hours.      Sedimentation Rate No results for input(s): ESRSEDRATE in the last 72 hours. C-Reactive Protein No results for input(s): CRP in the last 72 hours.  Micro Results: Recent Results (from the past 720 hour(s))  Culture, blood (Routine x 2)     Status: None   Collection Time: 06/11/21  2:23 PM   Specimen: BLOOD LEFT HAND  Result Value Ref Range Status   Specimen Description BLOOD LEFT HAND  Final   Special Requests   Final    BOTTLES DRAWN AEROBIC ONLY Blood Culture results may not be optimal due to an inadequate volume of blood received in culture bottles   Culture   Final    NO GROWTH 5 DAYS Performed at [facility], [place] [address], [place], [place] [address]    Report Status 06/16/2021 FINAL  Final  Urine Culture     Status: None   Collection Time: 06/11/21  2:24 PM   Specimen: In/Out Cath Urine  Result Value Ref Range Status   Specimen Description IN/OUT CATH URINE  Final   Special Requests NONE  Final   Culture   Final    NO GROWTH Performed at [facility], [place] [address], [place], [place] [address]    Report Status 06/12/2021 FINAL  Final  Resp Panel by RT-PCR (Flu A&B, Covid) Nasopharyngeal Swab     Status: None   Collection Time: 06/11/21  2:51 PM   Specimen: Nasopharyngeal Swab; Nasopharyngeal(NP) swabs in vial transport medium  Result Value Ref Range Status   SARS Coronavirus 2 by RT PCR NEGATIVE NEGATIVE Final    Comment: (NOTE) SARS-CoV-2 target nucleic acids are NOT DETECTED.  The SARS-CoV-2 RNA is generally detectable in upper respiratory specimens during the acute phase of infection. The lowest concentration of SARS-CoV-2 viral copies this assay can detect is 138 copies/mL. A negative result does not preclude SARS-Cov-2 infection and should not be used as the sole basis for treatment or other patient management decisions. A negative result may occur with  improper specimen collection/handling, submission of specimen  other than nasopharyngeal swab, presence of viral mutation(s) within the areas targeted by this assay, and inadequate number of viral copies(<138 copies/mL). A negative result must be combined with clinical observations, patient history, and epidemiological information. The expected result is Negative.  Fact Sheet for Patients:  EntrepreneurPulse.com.au  Fact Sheet for Healthcare Providers:  IncredibleEmployment.be  This test is no t yet approved or cleared by the [place] FDA and  has been authorized for detection and/or diagnosis of SARS-CoV-2 by FDA under an Emergency Use Authorization (EUA). This EUA will remain  in effect (meaning this test can be used) for the duration of the COVID-19 declaration under Section 564(b)(1) of the Act, 21 U.S.C.section 360bbb-3(b)(1), unless the authorization is terminated  or revoked sooner.       Influenza A by PCR NEGATIVE NEGATIVE Final   Influenza B by PCR NEGATIVE NEGATIVE Final    Comment: (NOTE) The Xpert Xpress SARS-CoV-2/FLU/RSV plus assay is intended as an aid in the diagnosis of influenza from Nasopharyngeal swab specimens and should not be used as a sole basis for treatment. Nasal washings and aspirates are unacceptable for Xpert Xpress SARS-CoV-2/FLU/RSV testing.  Fact Sheet for Patients: EntrepreneurPulse.com.au  Fact Sheet for Healthcare Providers: IncredibleEmployment.be  This test is not yet approved or cleared by the [place] FDA and has been authorized for detection and/or diagnosis of SARS-CoV-2 by FDA under  an Emergency Use Authorization (EUA). This EUA will remain in effect (meaning this test can be used) for the duration of the COVID-19 declaration under Section 564(b)(1) of the Act, 21 U.S.C. section 360bbb-3(b)(1), unless the authorization is terminated or revoked.  Performed at [facility], [place] [address], [place],  [place] [address]   Culture, blood (Routine x 2)     Status: Abnormal (Preliminary result)   Collection Time: 06/11/21  3:00 PM   Specimen: BLOOD RIGHT ARM  Result Value Ref Range Status   Specimen Description BLOOD RIGHT ARM  Final   Special Requests   Final    BOTTLES DRAWN AEROBIC AND ANAEROBIC Blood Culture adequate volume   Culture (A)  Final    HAEMOPHILUS INFLUENZAE BETA LACTAMASE POSITIVE CRITICAL RESULT CALLED TO, READ BACK BY AND VERIFIED WITH: PHARMD J.FRENS AT 2725 ON 06/13/2021 BY T.SAAD. HEALTH DEPARTMENT NOTIFIED Referred to [facility] in [place], [place] for serotyping. Performed at [facility], [place] [address], [place], [place] [address]    Report Status PENDING  Incomplete  CSF culture w Stat Gram Stain     Status: None   Collection Time: 06/12/21  3:40 PM   Specimen: CSF; Cerebrospinal Fluid  Result Value Ref Range Status   Specimen Description CSF  Final   Special Requests Normal  Final   Gram Stain   Final    ABUNDANT WBC PRESENT,BOTH PMN AND MONONUCLEAR NO ORGANISMS SEEN    Culture   Final    NO GROWTH 3 DAYS Performed at [facility], [place] [address], [place], [place] [address]    Report Status 06/15/2021 FINAL  Final  MRSA Next Gen by PCR, Nasal     Status: None   Collection Time: 06/12/21  4:54 PM   Specimen: Nasal Mucosa; Nasal Swab  Result Value Ref Range Status   MRSA by PCR Next Gen NOT DETECTED NOT DETECTED Final    Comment: (NOTE) The GeneXpert MRSA Assay (FDA approved for NASAL specimens only), is one component of a comprehensive MRSA colonization surveillance program. It is not intended to diagnose MRSA infection nor to guide or monitor treatment for MRSA infections. Test performance is not FDA approved in patients less than 8 years old. Performed at [facility], [place] [address] [place], [place] [address]     Studies/Results: Overnight EEG with video  Result Date: 06/16/2021 Lora Havens, MD     06/17/2021 10:32 AM  Patient Name: Lisa [name] MRN: [id number] Epilepsy Attending: Lora Havens Referring Physician/Provider: Dr Su Monks Duration: 06/15/2021 1833 to 06/16/2021 1450 Patient history:  61 y.o. 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 EEG to evaluate for seizure  Level of alertness:  lethargic AEDs during EEG study: None Technical aspects: This EEG study was done with scalp electrodes positioned according to the 10-20 International system of electrode placement. Electrical activity was acquired at a sampling rate of 500Hz  and reviewed with a high frequency filter of 70Hz  and a low frequency filter of 1Hz . EEG data were recorded continuously and digitally stored. Description: EEG showed continuous generalized predominantly 5 to 6 Hz theta slowing admixed with intermittent generalized 2 to 3 Hz delta slowing. Hyperventilation and photic stimulation were not performed.    ABNORMALITY - Continuous slow, generalized  IMPRESSION: This study is suggestive of moderate diffuse encephalopathy, nonspecific etiology. No  seizures or epileptiform discharges were seen throughout the recording.  Lisa [name]       Assessment/Plan:  INTERVAL HISTORY: Patient now complaining more of her shoulder pain and also of leg pain and has noted her inability to move her leg is well on the right side versus the left this apparently preceded her admission with severe bacterial meningitis  Principal Problem:   Acute bacterial meningitis Active Problems:   Diabetes mellitus, type 2 (HCC)   CSF leak   Acute metabolic encephalopathy   Altered mental status   Pressure injury of skin   Sepsis due to Haemophilus influenzae with acute hypoxic respiratory failure and septic shock (HCC)   Fever   Bacterial meningitis   Right sided weakness    Lisa [name] is a 61 y.o. female with history of CSF  leak, morbid obesity who has been admitted with severe haemophilus influenza bacteremia and bacterial meningitis with a large amount of white cells on lumbar puncture.  She has had recent right-sided weakness but MRI was not capable of being done   #1  Haemophilus influenza bacteremia and severe meningitis with mastoiditis  To high-dose ceftriaxone 2 g IV every 12 hours  I would like to get an MRI prior to her completing 2 weeks of therapy ensure she does not have a brain abscess  #2 right shoulder pain: This has been present for a month.  She says was evaluated with a plain films before.  I think infection with haemophilus influenza is not terribly likely but of thoroughness I will get an MRI with contrast of the right shoulder  #3  Right lower extremity weakness: According to the patient husband this preceded her admission to the hospital with bacterial meningitis  For to primary team if they want to repeat MRI I want to get 1 more towards the end of her treatment.  #4  CSF leak:  Will need to take precautions and take antibiotics if he has symptoms concerning for bacterial sinus infection to prevent the same thing happening again.  Ultimately hopefully there is a neurosurgical option to fix this.  I spent 37 minutes with the patient including than 50% of the time in face to face counseling of the patient and her husband regard the nature of her bacterial meningitis her CSF leak work-up for shoulder pain right lower extremity weakness personally reviewing x-ray chest updated culture CBC BMP along with review of medical records in preparation for the visit and during the visit and in coordination of her care. .    LOS: 6 days   Alcide Evener 06/17/2021, 11:23 AM

## 2021-06-17 NOTE — Progress Notes (Signed)
Neurology progress note  S: Patient is more alert and clarifies that she has RUE weakness at baseline 2/2 prior shoulder injury and RLE at baseline as well 2/2 OA and shoulder issues. No new complaints today.  O:   Vitals:   06/17/21 0400 06/17/21 0500  BP: (!) 114/57 123/72  Pulse: 75 79  Resp: 13 (!) 22  Temp: 98.5 F (36.9 C)   SpO2: 98% 97%    Physical Exam Gen: oriented x3, following simple commands HEENT: Atraumatic, normocephalic; oropharynx clear, tongue without atrophy or fasciculations. Resp: CTAB, normal work of breathing CV: RRR, extremities appear well-perfused. Abd: soft/NT/ND Extrem: Nml bulk; no cyanosis, clubbing, or edema.  Neuro: *MS: oriented x3, following simple commands *Speech: mild dysarthria, no aphasia *CN: PERRL, (+) corneals, EOMI, blinks to threat bilat, protecting airway, face symmetric at rest, hearing intact to voice *Motor & sensory: anti-gravity in all extremities but RUE and RLE limited 2/2 pain *Reflexes:  2+ and symmetric throughout without clonus; toes down-going bilat *Coordinatio, Gait: UTA  MRI brain wwo 06/12/21  1. Diffuse leptomeningeal enhancement involving both cerebral hemispheres, highly suggestive of acute meningitis. No complicating features. 2. Extensive paranasal sinus disease with large bilateral mastoid effusions. While these findings are of unknown acuity, possible acute sinusitis and/or mastoiditis could be present, and could potentially serve as a source for seeding of CNS infection.  CNS imaging personally reviewed  A/P: 61 yo woman with chronic CSF leak admitted with encephalopathy and fevers and found to have CSF c/w bacterial meningitis and imaging c/f mastoiditis. ID consulted; patient is currently on ceftriaxone and flagyl. Patient extubated 1/6 and at that time was noted to have mild R sided weakness therefore neurology was reconsulted. Patient was unable to tolerate repeat MRI brain. EEG showed no seizure  activity x24 hrs. Patient is more alert today and able to tell me that her RUE and RLE weakness are chronic. No further stroke workup indicated at this time; MRI may be deferred. ID does want a repeat MRI brain wwo prior to completion of her IV abx course, which they may order when they feel timing is appropriate.  - Plavix d/c'd; continue ASA 81mg  daily which is a home med - No indication for further neurologic workup at this time - Abx per ID  Neurology to sign off, but please re-engage if additional neurologic concerns arise.  Su Monks, MD [facility] [phone]  If 7pm- 7am, please page neurology on call as listed in [place].

## 2021-06-17 NOTE — Progress Notes (Signed)
[facility] Progress Note Patient Name: Lisa [name] DOB: December 31, 1960 MRN: [id number]   Date of Service  06/17/2021  HPI/Events of Note  Pt has Hx anxiety and depression and has no PRNs for it. Stating that she prefers not to take the Klonopin because of the sedative effect, asking for PRN Xanax. She said she takes it at home but I couldn't find it on her PTA meds list.  eICU Interventions  Xanax ordered.      Intervention Category Minor Interventions: Agitation / anxiety - evaluation and management  Elmer Sow 06/17/2021, 5:43 AM

## 2021-06-17 NOTE — Progress Notes (Signed)
[facility] Progress Note Patient Name: Lisa [name] DOB: Dec 28, 1960 MRN: [id number]   Date of Service  06/17/2021  HPI/Events of Note  Pt received PRN Ambien for sleep, ineffective. RN suggesting melatonin.  eICU Interventions  Insomnia - melatonin ordered     Intervention Category Minor Interventions: Other:  Elmer Sow 06/17/2021, 1:18 AM

## 2021-06-17 NOTE — Progress Notes (Signed)
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 Intermittent CSF leak x 15 years, associated HA for about 2 days after each CSF leak episode. LP was attempted x3 in ED without success. Admitted to IMTS for suspected bacterial meningitis. She was started on vanc, rocephin, ampicillin. he also exhibited agitated delirium and was given Haldol and zyprexa.  On 1/4 the patient went to IR for LP and required intubation for the procedure. She remains intubated after the procedure. LP yielded 56ml cloudy CSF fluid which was sent for analysis   PCCM is consulted in this setting   Admitting labs reveal leukocytosis WBC 14.2 remaining CBC WNL. Metabolic abnormalities include Na 133 Glu 210 Ca 8.7  Pertinent  Medical History  Temporal bone abnormality CSF leaks HTN HLD DM2 Chronic pain  Rectocele  Depression  Sleep apnea Hepatic steatosis   Significant Hospital Events: Including procedures, antibiotic start and stop dates in addition to other pertinent events   1/3 admitted to IMTS for suspected bacterial meningitis. LP attempt x3 in ED without success. Haldol, zyprexa for agitation 1/4 LP with IR, required intubation for procedure. Remains intubated, CCM consulted  1/5 LP consistent with bacterial meningitis: MRI brain Diffuse leptomeningeal enhancement involving both cerebral hemispheres, highly suggestive of acute meningitis. Extensive paranasal sinus disease with large bilateral mastoid While these findings are of unknown acuity, possible acute sinusitis and/or mastoiditis could be present, and could potentially serve as a source for seeding of CNS  infection 1/6 extubated.  Delirious postextubation requiring Precedex.  Neuro consulted for weakness with repeat MRI not showing significant changes 1/8 weaned off Precedex   Interim History / Subjective:  Complaining of insomnia and anxiety. Asking for specific medication changes.   Objective   Blood pressure 137/69, pulse 76, temperature 98.1 F (36.7 C), temperature source Oral, resp. rate (!) 30, height 5\' 6"  (1.676 m), weight 117 kg, last menstrual period 11/02/2013, SpO2 96 %.        Intake/Output Summary (Last 24 hours) at 06/17/2021 1015 Last data filed at 06/17/2021 1000 Gross per 24 hour  Intake 2961.25 ml  Output 1500 ml  Net 1461.25 ml    Filed Weights   06/12/21 1644 06/14/21 0339 06/17/21 0500  Weight: 118.5 kg 117.3 kg 117 kg    Examination: General:  Morbidly obese female in NAD Neuro:  Alert, oriented, non-focal HEENT:  [place]/AT, No JVD noted, PERRL Cardiovascular:  RRR, no MRG Lungs:  Clear bilateral breath sounds. No distress,  Abdomen:  Soft, non-distended, non-tender.  Musculoskeletal:  No acute deformity or ROM limitation.  Skin:  Intact, MMM. Sacral breakdown.   Pressure Injury 06/12/21 Sacrum Left;Upper Deep Tissue Pressure Injury - Purple or maroon localized area of discolored intact skin or blood-filled blister due to damage of underlying soft tissue from pressure and/or shear. (Active)  Date First Assessed/Time First Assessed: 06/12/21 1644   Location: Sacrum  Location Orientation: Left;Upper  Staging: Deep Tissue Pressure Injury - Purple or maroon localized area of discolored intact skin or blood-filled blister due to damage of unde...    Assessments 06/12/2021  4:44 PM 06/17/2021  8:00 AM  Dressing Type Foam - Lift dressing to  assess site every shift Foam - Lift dressing to assess site every shift  Dressing Changed;Clean;Dry;Intact Clean;Dry;Intact  Dressing Change Frequency Every 3 days Every 3 days  Site / Wound Assessment Clean;Dry;Purple Clean;Dry   Peri-wound Assessment Intact;Erythema (blanchable) Intact;Erythema (blanchable)  Drainage Amount None None  Treatment Cleansed Off loading     No Linked orders to display     Pressure Injury 06/12/21 Sacrum Mid Stage 1 -  Intact skin with non-blanchable redness of a localized area usually over a bony prominence. (Active)  Date First Assessed/Time First Assessed: 06/12/21 2000   Location: Sacrum  Location Orientation: Mid  Staging: Stage 1 -  Intact skin with non-blanchable redness of a localized area usually over a bony prominence.  Present on Admission: Yes    Assessments 06/13/2021 12:00 AM 06/17/2021  8:00 AM  Dressing Type Foam - Lift dressing to assess site every shift Foam - Lift dressing to assess site every shift  Dressing Clean;Dry;New drainage Clean;Dry;Intact  Site / Wound Assessment Clean;Dry;Pink Clean;Dry  % Wound base Red or Granulating 100% --  Peri-wound Assessment -- Intact  Margins Attached edges (approximated) Attached edges (approximated)  Drainage Amount None None  Treatment -- Off loading     No Linked orders to display    Resolved Hospital Problem list   Hyponatremia  Hypokalemia Ventilator dependence  Assessment & Plan:  Severe sepsis present on admission due to bacterial meningitis Bacterial meningitis in the setting of known CSF leak  H influenza bacteremia P: ID following: appreciate recs Continue ceftriaxone for H influenza and flagyl for mastoiditis. Per ID. Will need MRI towards the end of her ABX regimen to rule out brain abscess EEG monitoring  Acute delirium, metabolic encephalopathy: improved Anxiety Insomnia Perhap tobacco withdrawal is playing a role. Smokes 2 ppd for 40 years. "Quit" on 1-3 Hx of depression  P: Precedex off 1/8 Resume home Zoloft, Wellbutrin Ambien to 10 PRN xanax Nicotine patch  Right sided weakness: worrisome for CNS cause initially, however, but as her encephalopathy resolved the patient described old MSK  injuries as the culprit - no further neuro workup. Neurology signed off.   Hx of type 2 diabetes -Home medications include Victoza, metformin, and lantus  P: SSI coverage  Hx of HTN/HLD -Home medications include ASA, Lisinopril,  P: Continue aspirin Holding blood pressure medicine  Hypokalemia P: Give K Follow BMP  Pressure Injury 06/12/21 Sacrum Mid Stage 1, present on admission  P: - monitor, wound are consult if needed  Best Practice (right click and "Reselect all SmartList Selections" daily)   Diet/type: NPO DVT prophylaxis: LMWH GI prophylaxis: PPI Lines: N/A Foley:  N/A Code Status:  full code Last date of multidisciplinary goals of care discussion: 1/8 spoke with husband at bedside   Critical care time:     Georgann Housekeeper, AGACNP-BC [place] for personal pager PCCM on call pager [phone] until 7pm. Please call Elink 7p-7a. [phone]  06/17/2021 10:35 AM

## 2021-06-18 ENCOUNTER — Inpatient Hospital Stay (HOSPITAL_COMMUNITY): Payer: Commercial Managed Care - HMO

## 2021-06-18 DIAGNOSIS — B9689 Other specified bacterial agents as the cause of diseases classified elsewhere: Secondary | ICD-10-CM

## 2021-06-18 DIAGNOSIS — R6520 Severe sepsis without septic shock: Secondary | ICD-10-CM

## 2021-06-18 DIAGNOSIS — M1990 Unspecified osteoarthritis, unspecified site: Secondary | ICD-10-CM

## 2021-06-18 DIAGNOSIS — G9600 Cerebrospinal fluid leak, unspecified: Secondary | ICD-10-CM | POA: Diagnosis not present

## 2021-06-18 DIAGNOSIS — G9341 Metabolic encephalopathy: Secondary | ICD-10-CM | POA: Diagnosis not present

## 2021-06-18 DIAGNOSIS — J329 Chronic sinusitis, unspecified: Secondary | ICD-10-CM

## 2021-06-18 DIAGNOSIS — R5381 Other malaise: Secondary | ICD-10-CM

## 2021-06-18 DIAGNOSIS — R7881 Bacteremia: Secondary | ICD-10-CM

## 2021-06-18 DIAGNOSIS — A419 Sepsis, unspecified organism: Secondary | ICD-10-CM | POA: Diagnosis not present

## 2021-06-18 DIAGNOSIS — G009 Bacterial meningitis, unspecified: Secondary | ICD-10-CM | POA: Diagnosis not present

## 2021-06-18 DIAGNOSIS — H7090 Unspecified mastoiditis, unspecified ear: Secondary | ICD-10-CM

## 2021-06-18 DIAGNOSIS — L8991 Pressure ulcer of unspecified site, stage 1: Secondary | ICD-10-CM

## 2021-06-18 DIAGNOSIS — E1165 Type 2 diabetes mellitus with hyperglycemia: Secondary | ICD-10-CM

## 2021-06-18 LAB — CBC
HCT: 34.2 % — ABNORMAL LOW (ref 36.0–46.0)
Hemoglobin: 11.6 g/dL — ABNORMAL LOW (ref 12.0–15.0)
MCH: 30.9 pg (ref 26.0–34.0)
MCHC: 33.9 g/dL (ref 30.0–36.0)
MCV: 91.2 fL (ref 80.0–100.0)
Platelets: 353 10*3/uL (ref 150–400)
RBC: 3.75 MIL/uL — ABNORMAL LOW (ref 3.87–5.11)
RDW: 13.2 % (ref 11.5–15.5)
WBC: 9.5 10*3/uL (ref 4.0–10.5)
nRBC: 0 % (ref 0.0–0.2)

## 2021-06-18 LAB — BASIC METABOLIC PANEL
Anion gap: 7 (ref 5–15)
BUN: 5 mg/dL — ABNORMAL LOW (ref 8–23)
CO2: 24 mmol/L (ref 22–32)
Calcium: 7.8 mg/dL — ABNORMAL LOW (ref 8.9–10.3)
Chloride: 107 mmol/L (ref 98–111)
Creatinine, Ser: 0.56 mg/dL (ref 0.44–1.00)
GFR, Estimated: >60 mL/min (ref >60)
Glucose, Bld: 119 mg/dL — ABNORMAL HIGH (ref 70–99)
Potassium: 3.5 mmol/L (ref 3.5–5.1)
Sodium: 138 mmol/L (ref 135–145)

## 2021-06-18 LAB — GLUCOSE, CAPILLARY
Glucose-Capillary: 133 mg/dL — ABNORMAL HIGH (ref 70–99)
Glucose-Capillary: 104 mg/dL — ABNORMAL HIGH (ref 70–99)
Glucose-Capillary: 128 mg/dL — ABNORMAL HIGH (ref 70–99)
Glucose-Capillary: 124 mg/dL — ABNORMAL HIGH (ref 70–99)

## 2021-06-18 LAB — MAGNESIUM: Magnesium: 1.9 mg/dL (ref 1.7–2.4)

## 2021-06-18 LAB — PHOSPHORUS: Phosphorus: 2.8 mg/dL (ref 2.5–4.6)

## 2021-06-18 MED ORDER — ONDANSETRON HCL 4 MG/2ML IJ SOLN
4.0000 mg | Freq: Three times a day (TID) | INTRAMUSCULAR | Status: DC | PRN
  Administered 2021-06-18 – 2021-06-20 (×2): 4 mg via INTRAVENOUS
  Filled 2021-06-18 (×2): qty 2

## 2021-06-18 MED ORDER — POTASSIUM CHLORIDE 20 MEQ PO PACK
40.0000 meq | PACK | Freq: Once | ORAL | Status: AC
  Administered 2021-06-18: 40 meq via ORAL
  Filled 2021-06-18: qty 2

## 2021-06-18 MED ORDER — GADOBUTROL 1 MMOL/ML IV SOLN
10.0000 mL | Freq: Once | INTRAVENOUS | Status: AC | PRN
  Administered 2021-06-18: 10 mL via INTRAVENOUS

## 2021-06-18 MED ORDER — ASPIRIN EC 81 MG PO TBEC
81.0000 mg | DELAYED_RELEASE_TABLET | Freq: Every day | ORAL | Status: DC
  Administered 2021-06-19 – 2021-06-20 (×2): 81 mg via ORAL
  Filled 2021-06-18 (×2): qty 1

## 2021-06-18 MED ORDER — LISINOPRIL 2.5 MG PO TABS
5.0000 mg | ORAL_TABLET | Freq: Every day | ORAL | Status: DC
  Administered 2021-06-19 – 2021-06-20 (×2): 5 mg via ORAL
  Filled 2021-06-18 (×3): qty 2

## 2021-06-18 NOTE — Progress Notes (Signed)
Physical Therapy Treatment Patient Details Name: Lisa [name] MRN: [id number] DOB: December 20, 1960 Today's Date: 06/18/2021   History of Present Illness 61 y/o female presented to ED on 06/11/21 for AMS after flu like symptoms. Head CT negative. Lumbar puncture on 1/4. CSF findings highly suspicious for bacterial meningitis. Intubated 1/4-1/6. After extubation, patient with mild R sided weakness. MRI with suspect minimal layering debris in ventricles suggesting ventriculitis but study limited by agitation and movement. 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 Required mod A +2 for bed mobility and max A +2 to attempt standing X2. Pt only able to maintain very brief periods of standing this session. Current recommendations for acute inpatient rehab appropriate. Will continue to follow acutely.     Recommendations for follow up therapy are one component of a multi-disciplinary discharge planning process, led by the attending physician.  Recommendations may be updated based on patient status, additional functional criteria and insurance authorization.  Follow Up Recommendations  Acute inpatient rehab (3hours/day)     Assistance Recommended at Discharge Frequent or constant Supervision/Assistance  Patient can return home with the following Two people to help with walking and/or transfers;Two people to help with bathing/dressing/bathroom;Assistance with cooking/housework;Assistance with feeding;Direct supervision/assist for financial management;Direct supervision/assist for medications management;Assist for transportation;Help with stairs or ramp for entrance   Equipment Recommendations  Other (comment) (TBD)    Recommendations for Other Services Rehab consult     Precautions / Restrictions Precautions Precautions: Fall Restrictions Weight Bearing Restrictions: No      Mobility  Bed Mobility Overal bed mobility: Needs Assistance Bed Mobility: Supine to Sit;Sit to Supine Rolling: Mod assist   Supine to sit: Mod assist;+2 for physical assistance Sit to supine: Mod assist;+2 for physical assistance   General bed mobility comments: Required mod A for trunk and LE assist to perform bed mobility.    Transfers Overall transfer level: Needs assistance Equipment used: Rolling walker (2 wheels) Transfers: Sit to/from Stand Sit to Stand: Max assist;+2 physical assistance           General transfer comment: Attempted to stand X2. Minimal clearance of hips on 1st attempt. Pt with very flexed posture on second attempt and only able to maintain standing for brief period. Pt had BM so return to supine for clean up.    Ambulation/Gait                   Stairs             Wheelchair Mobility    Modified Rankin (Stroke Patients Only)       Balance Overall balance assessment: Needs assistance Sitting-balance support: No upper extremity supported;Feet supported Sitting balance-Leahy Scale: Fair     Standing balance support: Bilateral upper extremity supported Standing balance-Leahy Scale: Poor Standing balance comment: Reliant on BUE and external support                            Cognition Arousal/Alertness: Awake/alert Behavior During Therapy: WFL for tasks assessed/performed Overall Cognitive Status: No family/caregiver present to determine baseline cognitive functioning                                 General Comments: Pt much more alert. Decreased awareness of deficits. Also unaware that  she had a BM        Exercises      General Comments General comments (skin integrity, edema, etc.): No family present      Pertinent Vitals/Pain Pain Assessment: Faces Faces Pain Scale: Hurts little more Pain Location: back and R shoulder Pain Descriptors / Indicators: Guarding;Grimacing Pain  Intervention(s): Limited activity within patient's tolerance;Monitored during session;Repositioned    Home Living Family/patient expects to be discharged to:: Private residence Living Arrangements: Spouse/significant other;Children Available Help at Discharge: Family;Available 24 hours/day Type of Home: House Home Access: Stairs to enter       Home Layout: Two level;Able to live on main level with bedroom/bathroom Home Equipment: Grab bars - tub/shower;Hand held shower head;Shower seat;Rollator (4 wheels);Cane - single point      Prior Function            PT Goals (current goals can now be found in the care plan section) Acute Rehab PT Goals Patient Stated Goal: to go home PT Goal Formulation: With patient Time For Goal Achievement: 06/29/21 Potential to Achieve Goals: Fair Progress towards PT goals: Progressing toward goals    Frequency    Min 3X/week      PT Plan Current plan remains appropriate    Co-evaluation PT/OT/SLP Co-Evaluation/Treatment: Yes Reason for Co-Treatment: For patient/therapist safety;To address functional/ADL transfers;Complexity of the patient's impairments (multi-system involvement) PT goals addressed during session: Mobility/safety with mobility;Balance        AM-PAC PT "6 Clicks" Mobility   Outcome Measure  Help needed turning from your back to your side while in a flat bed without using bedrails?: A Lot Help needed moving from lying on your back to sitting on the side of a flat bed without using bedrails?: Total Help needed moving to and from a bed to a chair (including a wheelchair)?: Total Help needed standing up from a chair using your arms (e.g., wheelchair or bedside chair)?: Total Help needed to walk in hospital room?: Total Help needed climbing 3-5 steps with a railing? : Total 6 Click Score: 7    End of Session Equipment Utilized During Treatment: Gait belt Activity Tolerance: Patient tolerated treatment well Patient left:  in bed;with call bell/phone within reach;with bed alarm set (in chair position) Nurse Communication: Mobility status;Other (comment) (pt nauseous) PT Visit Diagnosis: Muscle weakness (generalized) (M62.81);Other abnormalities of gait and mobility (R26.89);Other symptoms and signs involving the nervous system (M62.947)     Time: 6546-5035 PT Time Calculation (min) (ACUTE ONLY): 34 min  Charges:  $Therapeutic Activity: 8-22 mins                     Lou Miner, DPT  Acute Rehabilitation Services  Pager: [phone] Office: [phone]    Rudean Hitt 06/18/2021, 10:38 AM

## 2021-06-18 NOTE — Progress Notes (Signed)
[facility] Infectious Diseases Follow Up Note  Patient Identification: Patient Name: Lisa [name] MRN: [id number] [place] Date: 06/11/2021  1:49 PM Age: 61 y.o.Today's Date: 06/18/2021   Reason for Visit: Haemophilus influenza bacteremia/meningitis  Principal Problem:   Acute bacterial meningitis Active Problems:   Diabetes mellitus, type 2 (HCC)   CSF leak   Acute metabolic encephalopathy   Altered mental status   Pressure injury of skin   Sepsis due to Haemophilus influenzae with acute hypoxic respiratory failure and septic shock (HCC)   Fever   Bacterial meningitis   Right sided weakness   Antibiotics: Ceftriaxone 1/3-current                     Metronidazole 1/3-current  Lines/Hardwares: PIV  Interval Events: Continues to be afebrile, no leukocytosis.    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 Denies headache, blurry vision, hearing impairment. 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 She also tells she has h/o low pressure headaches. She has h/o weakness in the Rt upper and lower extremity since jan 2022.   Recommendations Complete 14-day course of IV ceftriaxone 2 g IV 12 and IV/PO Metronidazole 500mg  TID. End date 06/25/21 Repeat MRI brain wwo contrast as previously recommended by Dr Tommy Medal prior to completion of 14 days course, around  1/16. Call us back once MRI done  Monitor CBC and CMP PT and OT- she would like to get up and move  around ID available as needed. Please call with questions  Rest of the management as per the primary team. Thank you for the consult. Please page with pertinent questions or concerns.  ______________________________________________________________________ Subjective patient seen and examined at the bedside. Clinically doing well.  Denies headache, blurry vision and hearing changes Neck pain she thinks is due to lying in bed for long time Rt upper and lower extremity weakness is chronic   Vitals BP (!) 146/76 (BP Location: Right Arm)    Pulse 63    Temp 99.7 F (37.6 C) (Oral)    Resp (!) 25    Ht 5\' 6"  (1.676 m)    Wt 128.6 kg    LMP 11/02/2013    SpO2 96%    BMI 45.76 kg/m     Physical Exam Constitutional:  lying in bed and obese     Comments:   Cardiovascular:     Rate and Rhythm: Normal rate and regular rhythm.     Heart sounds:   Pulmonary:     Effort: Pulmonary effort is normal.     Comments:   Abdominal:     Palpations: Abdomen is soft. obese    Tenderness: non tender  Musculoskeletal:        General: No swelling or tenderness.   Skin:    Comments: No obvious lesions or rashes   Neurological:     General: Rt sided weakness. She is able to squeeze my hand by her rt hand and able to lift her rt leg from the bed briefly   Psychiatric:  Mood and Affect: Mood normal.    Pertinent Microbiology Results for orders placed or performed during the hospital encounter of 06/11/21  Culture, blood (Routine x 2)     Status: None   Collection Time: 06/11/21  2:23 PM   Specimen: BLOOD LEFT HAND  Result Value Ref Range Status   Specimen Description BLOOD LEFT HAND  Final   Special Requests   Final    BOTTLES DRAWN AEROBIC ONLY Blood Culture results may not be optimal due to an inadequate volume of blood received in culture bottles   Culture   Final    NO GROWTH 5 DAYS Performed at [facility], [place] [address] [place], [place] [address]    Report Status  06/16/2021 FINAL  Final  Urine Culture     Status: None   Collection Time: 06/11/21  2:24 PM   Specimen: In/Out Cath Urine  Result Value Ref Range Status   Specimen Description IN/OUT CATH URINE  Final   Special Requests NONE  Final   Culture   Final    NO GROWTH Performed at [facility], [place] [address] [place], [place] [address]    Report Status 06/12/2021 FINAL  Final  Resp Panel by RT-PCR (Flu A&B, Covid) Nasopharyngeal Swab     Status: None   Collection Time: 06/11/21  2:51 PM   Specimen: Nasopharyngeal Swab; Nasopharyngeal(NP) swabs in vial transport medium  Result Value Ref Range Status   SARS Coronavirus 2 by RT PCR NEGATIVE NEGATIVE Final    Comment: (NOTE) SARS-CoV-2 target nucleic acids are NOT DETECTED.  The SARS-CoV-2 RNA is generally detectable in upper respiratory specimens during the acute phase of infection. The lowest concentration of SARS-CoV-2 viral copies this assay can detect is 138 copies/mL. A negative result does not preclude SARS-Cov-2 infection and should not be used as the sole basis for treatment or other patient management decisions. A negative result may occur with  improper specimen collection/handling, submission of specimen other than nasopharyngeal swab, presence of viral mutation(s) within the areas targeted by this assay, and inadequate number of viral copies(<138 copies/mL). A negative result must be combined with clinical observations, patient history, and epidemiological information. The expected result is Negative.  Fact Sheet for Patients:  EntrepreneurPulse.com.au  Fact Sheet for Healthcare Providers:  IncredibleEmployment.be  This test is no t yet approved or cleared by the [place] FDA and  has been authorized for detection and/or diagnosis of SARS-CoV-2 by FDA under an Emergency Use Authorization (EUA). This EUA will remain  in effect (meaning this test can be used) for the duration  of the COVID-19 declaration under Section 564(b)(1) of the Act, 21 U.S.C.section 360bbb-3(b)(1), unless the authorization is terminated  or revoked sooner.       Influenza A by PCR NEGATIVE NEGATIVE Final   Influenza B by PCR NEGATIVE NEGATIVE Final    Comment: (NOTE) The Xpert Xpress SARS-CoV-2/FLU/RSV plus assay is intended as an aid in the diagnosis of influenza from Nasopharyngeal swab specimens and should not be used as a sole basis for treatment. Nasal washings and aspirates are unacceptable for Xpert Xpress SARS-CoV-2/FLU/RSV testing.  Fact Sheet for Patients: EntrepreneurPulse.com.au  Fact Sheet for Healthcare Providers: IncredibleEmployment.be  This test is not yet approved or cleared by the [place] FDA and has been authorized for detection and/or diagnosis of SARS-CoV-2 by FDA under an Emergency Use Authorization (EUA). This EUA will remain in effect (meaning this test can be used) for the duration of  the COVID-19 declaration under Section 564(b)(1) of the Act, 21 U.S.C. section 360bbb-3(b)(1), unless the authorization is terminated or revoked.  Performed at [facility], [place] [address] [place], [place] [address]   Culture, blood (Routine x 2)     Status: Abnormal (Preliminary result)   Collection Time: 06/11/21  3:00 PM   Specimen: BLOOD RIGHT ARM  Result Value Ref Range Status   Specimen Description BLOOD RIGHT ARM  Final   Special Requests   Final    BOTTLES DRAWN AEROBIC AND ANAEROBIC Blood Culture adequate volume   Culture (A)  Final    HAEMOPHILUS INFLUENZAE BETA LACTAMASE POSITIVE CRITICAL RESULT CALLED TO, READ BACK BY AND VERIFIED WITH: PHARMD J.FRENS AT 0350 ON 06/13/2021 BY T.SAAD. HEALTH DEPARTMENT NOTIFIED Referred to [facility] in [place], [place] for serotyping. Performed at [facility], [place] [address], [place], [place] [address]    Report Status PENDING  Incomplete  CSF culture w Stat  Gram Stain     Status: None   Collection Time: 06/12/21  3:40 PM   Specimen: CSF; Cerebrospinal Fluid  Result Value Ref Range Status   Specimen Description CSF  Final   Special Requests Normal  Final   Gram Stain   Final    ABUNDANT WBC PRESENT,BOTH PMN AND MONONUCLEAR NO ORGANISMS SEEN    Culture   Final    NO GROWTH 3 DAYS Performed at [facility], [place] [address] [place], [place] [address]    Report Status 06/15/2021 FINAL  Final  MRSA Next Gen by PCR, Nasal     Status: None   Collection Time: 06/12/21  4:54 PM   Specimen: Nasal Mucosa; Nasal Swab  Result Value Ref Range Status   MRSA by PCR Next Gen NOT DETECTED NOT DETECTED Final    Comment: (NOTE) The GeneXpert MRSA Assay (FDA approved for NASAL specimens only), is one component of a comprehensive MRSA colonization surveillance program. It is not intended to diagnose MRSA infection nor to guide or monitor treatment for MRSA infections. Test performance is not FDA approved in patients less than 72 years old. Performed at [facility], [place] [address] [place],  [address]     Pertinent Lab. CBC Latest Ref Rng & Units 06/18/2021 06/17/2021 06/16/2021  WBC 4.0 - 10.5 K/uL 9.5 10.5 10.7(H)  Hemoglobin 12.0 - 15.0 g/dL 11.6(L) 11.1(L) 11.5(L)  Hematocrit 36.0 - 46.0 % 34.2(L) 34.3(L) 33.8(L)  Platelets 150 - 400 K/uL 353 347 295   CMP Latest Ref Rng & Units 06/18/2021 06/17/2021 06/16/2021  Glucose 70 - 99 mg/dL 119(H) 106(H) 95  BUN 8 - 23 mg/dL 5(L) 12 17  Creatinine 0.44 - 1.00 mg/dL 0.56 0.75 0.73  Sodium 135 - 145 mmol/L 138 139 142  Potassium 3.5 - 5.1 mmol/L 3.5 3.3(L) 3.1(L)  Chloride 98 - 111 mmol/L 107 105 107  CO2 22 - 32 mmol/L 24 23 21(L)  Calcium 8.9 - 10.3 mg/dL 7.8(L) 8.1(L) 8.4(L)  Total Protein 6.5 - 8.1 g/dL - - -  Total Bilirubin 0.3 - 1.2 mg/dL - - -  Alkaline Phos 38 - 126 U/L - - -  AST 15 - 41 U/L - - -  ALT 0 - 44 U/L - - -     Pertinent Imaging today Plain films and CT images  have been personally visualized and interpreted; radiology reports have been reviewed. Decision making incorporated into the Impression / Recommendations. DG Chest 1 View  Result  Date: 06/11/2021 CLINICAL DATA:  Fever. EXAM: CHEST  1 VIEW COMPARISON:  None. FINDINGS: Mild cardiomegaly. Left lung is clear. Mild right basilar atelectasis or infiltrate is noted. The visualized skeletal structures are unremarkable. IMPRESSION: Mild right basilar atelectasis or infiltrate is noted. Electronically Signed   By: Marijo Conception M.D.   On: 06/11/2021 14:48   CT Head Wo Contrast  Result Date: 06/11/2021 CLINICAL DATA:  Mental status change. EXAM: CT HEAD WITHOUT CONTRAST TECHNIQUE: Contiguous axial images were obtained from the base of the skull through the vertex without intravenous contrast. COMPARISON:  None. FINDINGS: Brain: No intracranial hemorrhage, mass effect, or midline shift. Normal for age brain volume. Slight periventricular chronic small vessel ischemia. No hydrocephalus. The basilar cisterns are patent. No evidence of territorial infarct or acute ischemia. No extra-axial or intracranial fluid collection. Vascular: No hyperdense vessel or unexpected calcification. Skull: No skull fracture or acute findings. Presumed symmetric prominent arachnoid granulations in the anterior temporal bones, nonaggressive in appearance. Sinuses/Orbits: Complete opacification of both mastoid air cells. Mucosal thickening of both sphenoid sinuses and scattered throughout ethmoid air cells. No sinus fluid levels. Orbits are unremarkable. Other: None. IMPRESSION: 1. No acute intracranial abnormality. 2. Complete opacification of both mastoid air cells. Mucosal thickening of both sphenoid sinuses and scattered throughout ethmoid air cells. Findings are of unknown acuity, may represent mastoiditis or sinusitis in the appropriate clinical setting. Electronically Signed   By: Keith Rake M.D.   On: 06/11/2021 19:28   MR BRAIN  WO CONTRAST  Result Date: 06/14/2021 CLINICAL DATA:  Neuro deficit, acute, stroke suspected; new onset RUE and RLE weakness in patient with bacterial meningitis EXAM: MRI HEAD WITHOUT CONTRAST TECHNIQUE: Multiplanar, multiecho pulse sequences of the brain and surrounding structures were obtained without intravenous contrast. COMPARISON:  06/13/2021 FINDINGS: Only axial and coronal DWI sequences were obtained. Patient could not continue with remainder of study. Suspect trace abnormal diffusion signal in the dependent occipital horns and dependent fourth ventricle on the left. No acute infarction. Ventricles are stable in size without hydrocephalus. No parenchymal edema. IMPRESSION: Single sequence study.  Patient could not continue with remainder. Suspect minimal layering debris in the ventricles suggesting ventriculitis. No hydrocephalus. No parenchymal lesion or edema. Electronically Signed   By: Macy Mis M.D.   On: 06/14/2021 16:03   MR BRAIN W WO CONTRAST  Result Date: 06/13/2021 CLINICAL DATA:  Initial evaluation for CNS infection, meningitis. EXAM: MRI HEAD WITHOUT AND WITH CONTRAST TECHNIQUE: Multiplanar, multiecho pulse sequences of the brain and surrounding structures were obtained without and with intravenous contrast. CONTRAST:  43mL GADAVIST GADOBUTROL 1 MMOL/ML IV SOLN COMPARISON:  Head CT from 06/11/2021 FINDINGS: Brain: Cerebral volume within normal limits for age. No significant cerebral white matter disease. No abnormal foci of restricted diffusion to suggest acute or subacute ischemia or changes related to seizure. No encephalomalacia to suggest chronic cortical infarction. No foci of susceptibility artifact to suggest acute or chronic intracranial hemorrhage. Probable 2.3 cm benign arachnoid cyst at the left middle cranial fossa. No other mass lesion, midline shift or mass effect. No hydrocephalus or extra-axial fluid collection. Pituitary gland suprasellar region within normal limits.  Midline structures intact. Subtle diffuse left a meningeal enhancement seen throughout both cerebral hemispheres, best appreciated on coronal post gad sequences (series 19, image 12). Finding highly suggestive of acute meningitis. No visible ependymal enhancement or intraventricular debris to suggest associated ventriculitis. Again, no parenchymal signal changes to suggest associated encephalitis/cerebritis. No concerning extra-axial collections. Vascular: Major intracranial  vascular flow voids are maintained. Major dural sinuses appear grossly patent following contrast administration. Skull and upper cervical spine: Craniocervical junction within normal limits. Bone marrow signal intensity within normal limits with no focal marrow replacing lesion. No scalp soft tissue abnormality. Sinuses/Orbits: Globes and orbital soft tissues demonstrate no acute finding. Extensive mucosal thickening present throughout the paranasal sinuses. Large bilateral mastoid effusions present as well. Patient appears to be intubated. Other: None. IMPRESSION: 1. Diffuse leptomeningeal enhancement involving both cerebral hemispheres, highly suggestive of acute meningitis. No complicating features. 2. Extensive paranasal sinus disease with large bilateral mastoid effusions. While these findings are of unknown acuity, possible acute sinusitis and/or mastoiditis could be present, and could potentially serve as a source for seeding of CNS infection. Electronically Signed   By: Jeannine Boga M.D.   On: 06/13/2021 05:35   IR Fluoro Guide Ndl Plmt / BX  Result Date: 06/12/2021 CLINICAL DATA:  Patient admitted with altered mental status, fevers, history of CSF leak. Interventional radiology asked to perform a diagnostic lumbar puncture. EXAM: DIAGNOSTIC LUMBAR PUNCTURE UNDER FLUOROSCOPIC GUIDANCE COMPARISON:  None FLUOROSCOPY TIME:  Fluoroscopy Time:  18 seconds Radiation Exposure Index (if provided by the fluoroscopic device): 16.1 mGy  Number of Acquired Spot Images: 0 PROCEDURE: Informed consent was obtained from the patient's husband prior to the procedure, including potential complications of headache, allergy, and pain. With the patient prone, the lower back was prepped with Betadine. 1% Lidocaine was used for local anesthesia. Lumbar puncture was performed at the L3-L4 level using a 6 inch 20 gauge gauge needle with return of cloudy CSF with an opening pressure of 45.25 cm water. 12 ml of CSF were obtained for laboratory studies. The patient tolerated the procedure well and there were no apparent complications. IMPRESSION: Technically successful L3-L4 lumbar puncture yielding 12 mL cloudy CSF for laboratory studies. Read by: Soyla Dryer, NP Electronically Signed   By: Corrie Mckusick D.O.   On: 06/12/2021 16:38   Portable Chest x-ray  Result Date: 06/12/2021 CLINICAL DATA:  Endotracheal tube present Z97.8 (ICD-10-CM) EXAM: PORTABLE CHEST 1 VIEW COMPARISON:  06/11/2021 FINDINGS: Endotracheal tube tip at the level of the clavicular heads. Increased left basilar and similar right midlung opacities. No visible pneumothorax. Left pleural effusion is not excluded. Cardiomediastinal silhouette is mildly enlarged and likely accentuated by AP technique. IMPRESSION: 1. Endotracheal tube tip at the level of the clavicular heads. 2. Increased left basilar and similar right midlung opacities, which could represent atelectasis or pneumonia. Electronically Signed   By: Margaretha Sheffield M.D.   On: 06/12/2021 17:18   DG Abd Portable 1V  Result Date: 06/12/2021 CLINICAL DATA:  Enteric tube placement. EXAM: PORTABLE ABDOMEN - 1 VIEW COMPARISON:  Chest x-ray from same day. FINDINGS: Enteric tube in the stomach. Paucity of bowel gas. No radio-opaque calculi or other significant radiographic abnormality are seen. Unchanged left basilar airspace disease versus atelectasis. IMPRESSION: 1. Enteric tube in the stomach. Electronically Signed   By: Titus Dubin M.D.   On: 06/12/2021 18:49   EEG adult  Result Date: 06/13/2021 Lora Havens, MD     06/13/2021 11:49 AM Patient Name: AGUEDA HOUPT MRN: [id number] Epilepsy Attending: Lora Havens Referring Physician/Provider: Dr Donnetta Simpers Date: 06/13/2021 Duration: 23.43 mins Patient history: 61 y.o. 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 EEG to evaluate for seizure Level of alertness:  lethargic  AEDs during EEG study: Propofol Technical aspects: This EEG study was done with scalp electrodes positioned according to the 10-20 International system of electrode placement. Electrical activity was acquired at a sampling rate of 500Hz  and reviewed with a high frequency filter of 70Hz  and a low frequency filter of 1Hz . EEG data were recorded continuously and digitally stored. Description: EEG showed continuous generalized predominantly 5 to 6 Hz theta slowing admixed with intermittent generalized 2 to 3 Hz delta slowing. Hyperventilation and photic stimulation were not performed.   ABNORMALITY - Continuous slow, generalized IMPRESSION: This study is suggestive of moderate diffuse encephalopathy, nonspecific etiology. No seizures or epileptiform discharges were seen throughout the recording. Priyanka Barbra Sarks   Overnight EEG with video  Result Date: 06/16/2021 Lora Havens, MD     06/17/2021 10:32 AM Patient Name: SHELMA EIBEN MRN: [id number] Epilepsy Attending: Lora Havens Referring Physician/Provider: Dr Su Monks Duration: 06/15/2021 1833 to 06/16/2021 1450 Patient history:  61 y.o. 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 EEG to evaluate for seizure  Level of alertness:  lethargic AEDs during EEG study: None Technical aspects: This EEG study was  done with scalp electrodes positioned according to the 10-20 International system of electrode placement. Electrical activity was acquired at a sampling rate of 500Hz  and reviewed with a high frequency filter of 70Hz  and a low frequency filter of 1Hz . EEG data were recorded continuously and digitally stored. Description: EEG showed continuous generalized predominantly 5 to 6 Hz theta slowing admixed with intermittent generalized 2 to 3 Hz delta slowing. Hyperventilation and photic stimulation were not performed.    ABNORMALITY - Continuous slow, generalized  IMPRESSION: This study is suggestive of moderate diffuse encephalopathy, nonspecific etiology. No seizures or epileptiform discharges were seen throughout the recording.  Priyanka O Yadav    VAS [place] ABI WITH/WO TBI  Result Date: 06/12/2021  LOWER EXTREMITY DOPPLER STUDY Patient Name:  ELLENA KAMEN  Date of Exam:   06/12/2021 Medical Rec #: [id number]         Accession #:    [id number] Date of Birth: Jan 16, 1961         Patient Gender: F Patient Age:   34 years Exam Location:  [facility] Procedure:      VAS [place] ABI WITH/WO TBI Referring Phys: CHI ELLISON --------------------------------------------------------------------------------  Indications: Cool feet. High Risk Factors: Hypertension, hyperlipidemia, Diabetes.  Comparison Study: No prior studies. Performing Technologist: Carlos Levering RVT  Examination Guidelines: A complete evaluation includes at minimum, Doppler waveform signals and systolic blood pressure reading at the level of bilateral brachial, anterior tibial, and posterior tibial arteries, when vessel segments are accessible. Bilateral testing is considered an integral part of a complete examination. Photoelectric Plethysmograph (PPG) waveforms and toe systolic pressure readings are included as required and additional duplex testing as needed. Limited examinations for reoccurring indications may be performed as noted.  ABI Findings:  +---------+------------------+-----+---------+--------+  Right     Rt Pressure (mmHg) Index Waveform  Comment   +---------+------------------+-----+---------+--------+  Brachial  127                      triphasic           +---------+------------------+-----+---------+--------+  PTA       118                0.93  triphasic           +---------+------------------+-----+---------+--------+  DP        98                 0.77  triphasic           +---------+------------------+-----+---------+--------+  Great Toe 54                 0.43                      +---------+------------------+-----+---------+--------+ +--------+------------------+-----+---------+-------+  Left     Lt Pressure (mmHg) Index Waveform  Comment  +--------+------------------+-----+---------+-------+  Brachial 125                      triphasic          +--------+------------------+-----+---------+-------+  PTA      121                0.95  triphasic          +--------+------------------+-----+---------+-------+  DP       102                0.80  triphasic          +--------+------------------+-----+---------+-------+ +-------+-----------+-----------+------------+------------+  ABI/TBI Today's ABI Today's TBI Previous ABI Previous TBI  +-------+-----------+-----------+------------+------------+  Right   0.93        0.43                                   +-------+-----------+-----------+------------+------------+  Left    0.95                                               +-------+-----------+-----------+------------+------------+  Summary: Right: Resting right ankle-brachial index indicates mild right lower extremity arterial disease. The right toe-brachial index is abnormal. Left: Resting left ankle-brachial index is within normal range. No evidence of significant left lower extremity arterial disease. Unable to obtain TBI due to low amplitude waveforms.  *See table(s) above for measurements and observations.  Electronically signed by Harold Barban  MD on 06/12/2021 at 11:31:26 PM.    Final     I spent 45  minutes for this patient encounter including review of prior medical records, coordination of care with primary/other specialist with greater than 50% of time being face to face/counseling and discussing diagnostics/treatment plan with the patient/family.  Electronically signed by:   Rosiland Oz, MD Infectious Disease Physician [facility] Pager: [phone]

## 2021-06-18 NOTE — Progress Notes (Signed)
Speech Language Pathology Treatment: Dysphagia  Patient Details Name: Lisa [name] MRN: [id number] DOB: Oct 18, 1960 Today's Date: 06/18/2021 Time: 1583-0940 SLP Time Calculation (min) (ACUTE ONLY): 20 min  Assessment / Plan / Recommendation Clinical Impression  Pt says she does not have much of an appetite but was encouraged to try some solid foods by her husband, who reports that she has been asking some for food. She did not sit fully upright for PO intake, reporting pain in her neck if she goes any further upright. In this partially reclined position where she typically eats her meals, there is still intermittent throat clearing noted. Although this appeared to coincide more with thin liquids, she and her husband still report that it is a baseline throat clear from h/o smoking and that it occurs outside of PO intake and PTA. She consumed solids with functional appearing oral preparation and no oral residue, but she is still hesitant to advance too far. Initially interested in a finely chopped diet, after further discussion she would like to try mechanical soft. Orders updated to Dys 3 diet and thin liquids. Aspiration precautions were reviewed and pt was encouraged to sit up as much as possible.   HPI HPI: Pt is a 61 y/o female who presented to the ED on 06/11/21 for AMS after flu like symptoms. Head CT negative. Lumbar puncture on 1/4. CSF findings highly suspicious for bacterial meningitis. Intubated 1/4-1/6. After extubation, patient with mild R sided weakness. MRI with suspect minimal layering debris in ventricles suggesting ventriculitis but study limited by agitation and movement. 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  Recommendations may be  updated based on patient status, additional functional criteria and insurance authorization.    Recommendations  Diet recommendations: Dysphagia 3 (mechanical soft);Thin liquid Liquids provided via: Cup;Straw Medication Administration: Whole meds with puree Supervision: Patient able to self feed Compensations: Slow rate;Small sips/bites Postural Changes and/or Swallow Maneuvers: Seated upright 90 degrees                Oral Care Recommendations: Oral care BID Follow Up Recommendations: Acute inpatient rehab (3hours/day) Assistance recommended at discharge: Intermittent Supervision/Assistance SLP Visit Diagnosis: Dysphagia, unspecified (R13.10) Plan: Continue with current plan of care           Osie Bond., M.A. [facility] Pager [phone] Office [phone]  06/18/2021, 6:08 PM

## 2021-06-18 NOTE — Progress Notes (Signed)
SLP Cancellation Note  Patient Details Name: Lisa [name] MRN: [id number] DOB: April 05, 1961   Cancelled treatment:       Reason Eval/Treat Not Completed: Patient at procedure or test/unavailable. New order received for swallowing eval - SLP already following but pt is currently out of her room. Will f/u as able.    Osie Bond., M.A. [facility] Pager [phone] Office [phone]  06/18/2021, 3:21 PM

## 2021-06-18 NOTE — Progress Notes (Signed)
Occupational Therapy Treatment Patient Details Name: Lisa [name] MRN: [id number] DOB: [birth year] Today's Date: 06/18/2021   History of present illness 60 y/o female presented to ED on 06/11/21 for AMS after flu like symptoms. Head CT negative. Lumbar puncture on 1/4. CSF findings highly suspicious for bacterial meningitis. Intubated 1/4-1/6. After extubation, patient with mild R sided weakness. MRI with suspect minimal layering debris in ventricles suggesting ventriculitis but study limited by agitation and movement. 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 Pt able to partially stand 2x from EOB with max +2 physical assist. Mod A +2 for supine<>EOB and to scoot up along EOB. Plan to trial bariatric steady next session to work towards LB ADL and toilet transfer goals. Total A for pericare this session at bed level. D/c recommendation remains appropriate.    Recommendations for follow up therapy are one component of a multi-disciplinary discharge planning process, led by the attending physician.  Recommendations may be updated based on patient status, additional functional criteria and insurance authorization.    Follow Up Recommendations  Acute inpatient rehab (3hours/day)    Assistance Recommended at Discharge Frequent or constant Supervision/Assistance  Patient can return home with the following      Equipment Recommendations  Other (comment) (defer to next venue. may need tub transfer bench)    Recommendations for Other Services      Precautions / Restrictions Precautions Precautions: Fall Restrictions Weight Bearing Restrictions: No       Mobility Bed Mobility Overal bed mobility: Needs Assistance Bed Mobility: Supine to Sit;Sit to Supine Rolling: Mod assist   Supine to sit: Mod assist;+2 for physical assistance Sit to supine: Mod  assist;+2 for physical assistance   General bed mobility comments: Required mod A for trunk and LE assist to perform bed mobility.    Transfers Overall transfer level: Needs assistance Equipment used: Rolling walker (2 wheels) Transfers: Sit to/from Stand Sit to Stand: Max assist;+2 physical assistance           General transfer comment: Attempted to stand X2. Minimal clearance of hips on 1st attempt. Pt with very flexed posture on second attempt and only able to maintain standing for brief period. Pt had BM so return to supine for clean up.     Balance Overall balance assessment: Needs assistance Sitting-balance support: No upper extremity supported;Feet supported Sitting balance-Leahy Scale: Fair     Standing balance support: Bilateral upper extremity supported Standing balance-Leahy Scale: Poor Standing balance comment: Reliant on BUE and external support                           ADL either performed or assessed with clinical judgement   ADL Overall ADL's : Needs assistance/impaired Eating/Feeding: Sitting;Minimal assistance;Set up Eating/Feeding Details (indicate cue type and reason): up to BUE support and min A sitting EOB (unsupported sitting) Grooming: Moderate assistance;Sitting Grooming Details (indicate cue type and reason): up to BUE support and min A sitting EOB (unsupported sitting)                               General ADL Comments: Pt able to sit EOB, unsupported sitting with BUE and up to min A. Able to come to partial stand with +2 max A 2x. With mod A +2 able to  side-scoot EOB. Total assist for pericare at bed level.Pt more alert and conversational today. Plan to try bariatric Stedy next session.    Extremity/Trunk Assessment Upper Extremity Assessment Upper Extremity Assessment: Generalized weakness;RUE deficits/detail RUE Deficits / Details: h/o R shoulder pain for about 1 month. Pt awaiting MRI for R shoulder today. able to raise  shoulder enough to hold onto rw on right side while seated RUE: Shoulder pain at rest   Lower Extremity Assessment Lower Extremity Assessment: Defer to PT evaluation        Vision   Additional Comments: not formally assessed this session. pt wears glasses at baseline. Would benefit from further assessment next session   Perception     Praxis      Cognition Arousal/Alertness: Awake/alert Behavior During Therapy: WFL for tasks assessed/performed Overall Cognitive Status: No family/caregiver present to determine baseline cognitive functioning                                 General Comments: Pt much more alert. Decreased awareness of deficits. Also unaware that she had a BM          Exercises     Shoulder Instructions       General Comments no family present    Pertinent Vitals/ Pain       Pain Assessment: Faces Faces Pain Scale: Hurts little more Pain Location: back and R shoulder Pain Descriptors / Indicators: Guarding;Grimacing Pain Intervention(s): Limited activity within patient's tolerance;Monitored during session;Repositioned  Home Living Family/patient expects to be discharged to:: Private residence Living Arrangements: Spouse/significant other;Children Available Help at Discharge: Family;Available 24 hours/day Type of Home: House Home Access: Stairs to enter     Home Layout: Two level;Able to live on main level with bedroom/bathroom     Bathroom Shower/Tub: Teacher, early years/pre: Standard     Home Equipment: Grab bars - tub/shower;Hand held shower head;Shower seat;Rollator (4 wheels);Cane - single point          Prior Functioning/Environment              Frequency  Min 2X/week        Progress Toward Goals  OT Goals(current goals can now be found in the care plan section)  Progress towards OT goals: Progressing toward goals  Acute Rehab OT Goals OT Goal Formulation: With patient Time For Goal  Achievement: 06/29/21 Potential to Achieve Goals: Good ADL Goals Pt Will Perform Grooming: with min assist;sitting Pt Will Perform Upper Body Bathing: with min assist;sitting Pt Will Perform Upper Body Dressing: with min assist;sitting Pt Will Transfer to Toilet: with mod assist;stand pivot transfer;squat pivot transfer;bedside commode Pt Will Perform Toileting - Clothing Manipulation and hygiene: with mod assist;sit to/from stand;sitting/lateral leans Additional ADL Goal #1: Patient will be Mod A for supine to sit to increase independence with toileting  Plan Discharge plan remains appropriate    Co-evaluation    PT/OT/SLP Co-Evaluation/Treatment: Yes Reason for Co-Treatment: Complexity of the patient's impairments (multi-system involvement);For patient/therapist safety;To address functional/ADL transfers PT goals addressed during session: Mobility/safety with mobility;Balance OT goals addressed during session: ADL's and self-care;Proper use of Adaptive equipment and DME      AM-PAC OT "6 Clicks" Daily Activity     Outcome Measure   Help from another person eating meals?: A Little Help from another person taking care of personal grooming?: A Little Help from another person toileting, which includes using toliet, bedpan, or urinal?:  Total Help from another person bathing (including washing, rinsing, drying)?: A Lot Help from another person to put on and taking off regular upper body clothing?: A Little Help from another person to put on and taking off regular lower body clothing?: Total 6 Click Score: 13    End of Session Equipment Utilized During Treatment: Gait belt  OT Visit Diagnosis: Unsteadiness on feet (R26.81);Muscle weakness (generalized) (M62.81)   Activity Tolerance Patient tolerated treatment well;Other (comment) (pt c/o some nausea at end of session)   Patient Left in bed;with call bell/phone within reach;with bed alarm set   Nurse Communication Mobility status         Time: 2297-9892 OT Time Calculation (min): 34 min  Charges: OT General Charges $OT Visit: 1 Visit OT Treatments $Self Care/Home Management : 8-22 mins  Tyrone Schimke, OT [facility] Office: [phone]   Hortencia Pilar 06/18/2021, 10:58 AM

## 2021-06-18 NOTE — Progress Notes (Signed)
Inpatient Rehabilitation Admissions Coordinator   I met at bedside with patient for rehab assessment. We discussed goals and expectations of a possible [facility] admit. She lives with spouse and adult daughter who work. I will contact her spouse to further discuss caregivers supports after a possible [facility] admit.  Danne Baxter, RN, MSN Rehab Admissions Coordinator [phone] 06/18/2021 11:58 AM

## 2021-06-18 NOTE — Progress Notes (Addendum)
PROGRESS NOTE  Lisa [name] QZR:[id number] DOB: [birth year]   PCP: Linward Headland, MD  Patient is from: Home.  Lives with his family.  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  Started on appropriate antibiotics.  Had 3 unsuccessful LP attempts in ED. Finally had LP on 1/4 with IR, and required intubation for the procedure.  Remained intubated and transferred to ICU.  CSF fluid cytology and chemistry consistent with bacterial meningitis.  CSF culture was negative.  Blood culture with beta-lactamase positive haemophilus influenza in 1 out of 2 bottles.  MRI brain on 1/5 showed diffuse leptomeningeal enhancement involving both cerebral hemispheres suggestive for acute meningitis, and extensive paranasal sinus disease with large bilateral mastoid effusion concerning for possible acute sinusitis and/or mastoiditis. Antibiotics de-escalated to ceftriaxone and Flagyl by ID.  Eventually extubated on 1/6.  Had postextubation agitation/delirium requiring Precedex drip.  Neurology consulted.  Had repeat MRI that did not show significant change.  Weaned off Precedex on 1/8.  Transferred to [facility] hospitalist service on 1/10, although patient was supposed to go back to IMTS.  I have reached out to MTS resident, Dr. Marva Panda, who would assume care on 06/19/2021.  Infectious disease following.  Therapy recommended CIR.  See individual problem list below for more hospital course.  Subjective: Seen and examined earlier this morning.  No major events overnight of this morning.  Feels tired and sleepy.  She says she did not  have good sleep.  No other complaints.  She denies headache, vision change, chest pain, dyspnea, GI or UTI symptoms.  She has intermittent cough that she attributes to smoking cigarette.  She has weakness in the right arm and right leg that she attributes to severe arthritis.  Objective: Vitals:   06/18/21 0100 06/18/21 0258 06/18/21 0305 06/18/21 0751  BP: 140/71  (!) 141/84 (!) 146/76  Pulse: 77  74 63  Resp: (!) 25     Temp:   99.7 F (37.6 C)   TempSrc:   Oral   SpO2: 94%  96% 96%  Weight:  128.6 kg    Height:        Examination:  GENERAL: No apparent distress.  Nontoxic. HEENT: MMM.  Vision and hearing grossly intact.  NECK: Supple.  No apparent JVD.  RESP: 96% on RA.  No IWOB.  Fair aeration bilaterally. CVS:  RRR. Heart sounds normal.  ABD/GI/GU: BS+. Abd soft, NTND.  MSK/EXT:  Moves extremities. No apparent deformity. No edema.  SKIN: no apparent skin lesion or wound NEURO: Awake but not quite alert.  Oriented x4 except date.  Follows commands.  No facial asymmetry.  No apparent focal neuro deficit other than subtle right-sided weakness. PSYCH: Calm. Normal affect.   Procedures:  1/3-3 unsuccessful LP attempts in ED 1/4-LP by IR and intubation 1/6-extubation  Microbiology summarized: 1/3-COVID-19 and influenza PCR nonreactive. 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 -CSF fluid cytology and  chemistry consistent with bacterial meningitis.   -Culture data and MRI findings as above.   -Concern about acute sinusitis/mastoiditis which could have contributed as a source of infection -ID following. -Continue ceftriaxone for meningitis for a total of 2 weeks.  -Flagyl added for anaerobic  coverage for sinusitis/mastoiditis -ID recommends repeat MRI prior to IV antibiotic completion to ensure she does not have brain abscess    Acute delirium, metabolic encephalopathy: seems to have resolved.  Likely due to the above, ICU delirium and nicotine withdrawal.  High risk for polypharmacy. -Off Precedex on 1/8 -Manage infection as above -Reorientation and delirium precautions.  Anxiety/depression/insomnia: Stable -Continue as needed Xanax and Ambien at night  Right-sided weakness/right shoulder pain: Per patient, chronic issue that she attributes to osteoarthritis.  Neurology was consulted.  Repeat MRI brain without significant new finding.  Neurology signed off. -Follow MRI of right shoulder-ordered by ID given bacteremia.   Uncontrolled IDDM-2 with hyperglycemia and hyperlipidemia: A1c 8.4%.  On Lantus, metformin and Victoza at home Recent Labs  Lab 06/17/21 1520 06/17/21 1917 06/17/21 2108 06/17/21 2333 06/18/21 0914  GLUCAP 117* 119* 126* 124* 133*  -Continue current insulin regimen -Not on statin due to statin intolerance  Essential hypertension: Normotensive. -Resume home lisinopril -We will DC IV fluid if p.o. intake is good   Tobacco use disorder: Smokes up to 2 pack a day -Continue Wellbutrin -Continue nicotine patch -Encourage smoking cessation.  Hypokalemia: K3.5. -P.o. KCl 40x1 -Recheck K and Mg in the morning  At risk for polypharmacy: Multiple sedating medications on home med list. -Needs careful med review on discharge.  Debility/acute physical deconditioning in the setting of acute illness -Continue PT/OT-recommendation CIR  Dysphagia? -On full liquid diet. -We will request SLP eval  Morbid obesity Body mass index is 45.76 kg/m.  -She is on Victoza and metformin which could help -Consider bariatric surgery at the patient     Pressure skin injury: POA Pressure Injury 06/12/21 Sacrum Left;Upper Deep Tissue Pressure Injury - Purple or  maroon localized area of discolored intact skin or blood-filled blister due to damage of underlying soft tissue from pressure and/or shear. (Active)  06/12/21 1644  Location: Sacrum  Location Orientation: Left;Upper  Staging: Deep Tissue Pressure Injury - Purple or maroon localized area of discolored intact skin or blood-filled blister due to damage of underlying soft tissue from pressure and/or shear.  Wound Description (Comments):   Present on Admission: No     Pressure Injury 06/12/21 Sacrum Mid Stage 1 -  Intact skin with non-blanchable redness of a localized area usually over a bony prominence. (Active)  06/12/21 2000  Location: Sacrum  Location Orientation: Mid  Staging: Stage 1 -  Intact skin with non-blanchable redness of a localized area usually over a bony prominence.  Wound Description (Comments):   Present on Admission: Yes   DVT prophylaxis:  SCDs Start: 06/11/21 2124   On subcu Lovenox Code Status: Full code Family Communication: Patient and/or RN. Available if any question.  Level of care: Progressive Status is: Inpatient  Remains inpatient appropriate because: Bacterial meningitis requiring IV antibiotics and safe disposition/CIR   Final disposition: Likely CIR.    Consultants:  Pulmonology Neurology Interventional radiology for lumbar puncture   Sch Meds:  Scheduled Meds:  aspirin  81 mg Oral Daily   buPROPion  300 mg Oral Daily   docusate sodium  100 mg Oral BID   enoxaparin (LOVENOX) injection  60 mg Subcutaneous Q24H   insulin aspart  0-15 Units Subcutaneous  TID AC & HS   nicotine  14 mg Transdermal Q0600   pantoprazole  40 mg Oral QHS   polyethylene glycol  17 g Oral Daily   sertraline  100 mg Oral Daily   sodium chloride flush  3 mL Intravenous Q12H   Continuous Infusions:  cefTRIAXone (ROCEPHIN)  IV 2 g (06/18/21 1043)   lactated ringers 75 mL/hr at 06/18/21 0729   metronidazole 500 mg (06/18/21 1032)   PRN Meds:.acetaminophen **OR**  acetaminophen, ALPRAZolam, diclofenac Sodium, hydrALAZINE, ondansetron (ZOFRAN) IV, oxyCODONE, zolpidem  Antimicrobials: Anti-infectives (From admission, onward)    Start     Dose/Rate Route Frequency Ordered Stop   06/13/21 1015  metroNIDAZOLE (FLAGYL) IVPB 500 mg        500 mg 100 mL/hr over 60 Minutes Intravenous Every 8 hours 06/13/21 0929     06/12/21 1630  vancomycin (VANCOREADY) IVPB 1750 mg/350 mL  Status:  Discontinued        1,750 mg 175 mL/hr over 120 Minutes Intravenous Every 24 hours 06/11/21 1955 06/11/21 2000   06/12/21 1630  vancomycin (VANCOREADY) IVPB 1500 mg/300 mL  Status:  Discontinued        1,500 mg 150 mL/hr over 120 Minutes Intravenous Every 24 hours 06/11/21 2000 06/11/21 2132   06/12/21 0600  vancomycin (VANCOCIN) IVPB 1000 mg/200 mL premix  Status:  Discontinued        1,000 mg 200 mL/hr over 60 Minutes Intravenous Every 12 hours 06/11/21 2132 06/13/21 1127   06/11/21 2300  ceFEPIme (MAXIPIME) 2 g in sodium chloride 0.9 % 100 mL IVPB  Status:  Discontinued        2 g 200 mL/hr over 30 Minutes Intravenous Every 8 hours 06/11/21 1955 06/11/21 2123   06/11/21 2215  ampicillin (OMNIPEN) 2 g in sodium chloride 0.9 % 100 mL IVPB  Status:  Discontinued        2 g 300 mL/hr over 20 Minutes Intravenous Every 4 hours 06/11/21 2123 06/13/21 0929   06/11/21 2200  cefTRIAXone (ROCEPHIN) 2 g in sodium chloride 0.9 % 100 mL IVPB        2 g 200 mL/hr over 30 Minutes Intravenous Every 12 hours 06/11/21 2123 06/25/21 2159   06/11/21 1445  vancomycin (VANCOREADY) IVPB 2000 mg/400 mL        2,000 mg 200 mL/hr over 120 Minutes Intravenous  Once 06/11/21 1437 06/11/21 1843   06/11/21 1430  ceFEPIme (MAXIPIME) 2 g in sodium chloride 0.9 % 100 mL IVPB        2 g 200 mL/hr over 30 Minutes Intravenous  Once 06/11/21 1424 06/11/21 1627   06/11/21 1430  metroNIDAZOLE (FLAGYL) IVPB 500 mg        500 mg 100 mL/hr over 60 Minutes Intravenous  Once 06/11/21 1424 06/11/21 1627    06/11/21 1430  vancomycin (VANCOCIN) IVPB 1000 mg/200 mL premix  Status:  Discontinued        1,000 mg 200 mL/hr over 60 Minutes Intravenous  Once 06/11/21 1424 06/11/21 1437        I have personally reviewed the following labs and images: CBC: Recent Labs  Lab 06/11/21 1423 06/12/21 0040 06/12/21 1722 06/13/21 0701 06/14/21 1616 06/15/21 0536 06/16/21 0948 06/17/21 0435 06/18/21 0458  WBC 14.2* 14.3*  --  14.5*  --  8.7 10.7* 10.5 9.5  NEUTROABS 12.2* 11.7*  --   --   --   --   --   --   --   HGB  14.2 13.2   < > 12.4 10.9* 12.5 11.5* 11.1* 11.6*  HCT 41.9 39.5   < > 38.1 32.0* 37.5 33.8* 34.3* 34.2*  MCV 91.5 91.6  --  93.8  --  91.7 92.6 93.0 91.2  PLT 330 312   < > 293  --  148* 295 347 353   < > = values in this interval not displayed.   BMP &GFR Recent Labs  Lab 06/14/21 0455 06/14/21 1616 06/14/21 1633 06/15/21 0536 06/16/21 0638 06/16/21 1515 06/17/21 0435 06/18/21 0458  NA 141   < >  --  142 142 142 139 138  K 3.7   < >  --  4.4 5.9* 3.1* 3.3* 3.5  CL 108  --   --  110 113* 107 105 107  CO2 26  --   --  22 18* 21* 23 24  GLUCOSE 140*  --   --  131* 134* 95 106* 119*  BUN 21  --   --  19 20 17 12  5*  CREATININE 0.73  --   --  0.53 0.79 0.73 0.75 0.56  CALCIUM 8.2*  --   --  8.6* 8.3* 8.4* 8.1* 7.8*  MG 2.0  --  1.9  --  2.1  --  1.8 1.9  PHOS 2.2*  --  2.8  --  3.6  --  2.9 2.8   < > = values in this interval not displayed.   Estimated Creatinine Clearance: 101.4 mL/min (by C-G formula based on SCr of 0.56 mg/dL). Liver & Pancreas: Recent Labs  Lab 06/11/21 1423 06/12/21 0040 06/13/21 0701  AST 16 19 54*  ALT 16 15 39  ALKPHOS 52 46 41  BILITOT 0.7 0.2* 0.3  PROT 7.1 6.4* 6.0*  ALBUMIN 3.6 3.1* 2.8*   No results for input(s): LIPASE, AMYLASE in the last 168 hours. No results for input(s): AMMONIA in the last 168 hours. Diabetic: No results for input(s): HGBA1C in the last 72 hours. Recent Labs  Lab 06/17/21 1520 06/17/21 1917  06/17/21 2108 06/17/21 2333 06/18/21 0914  GLUCAP 117* 119* 126* 124* 133*   Cardiac Enzymes: No results for input(s): CKTOTAL, CKMB, CKMBINDEX, TROPONINI in the last 168 hours. No results for input(s): PROBNP in the last 8760 hours. Coagulation Profile: Recent Labs  Lab 06/11/21 1423 06/12/21 1937  INR 1.1 1.3*   Thyroid Function Tests: No results for input(s): TSH, T4TOTAL, FREET4, T3FREE, THYROIDAB in the last 72 hours. Lipid Profile: No results for input(s): CHOL, HDL, LDLCALC, TRIG, CHOLHDL, LDLDIRECT in the last 72 hours. Anemia Panel: No results for input(s): VITAMINB12, FOLATE, FERRITIN, TIBC, IRON, RETICCTPCT in the last 72 hours. Urine analysis:    Component Value Date/Time   COLORURINE YELLOW 06/11/2021 1432   APPEARANCEUR HAZY (A) 06/11/2021 1432   LABSPEC >1.030 (H) 06/11/2021 1432   PHURINE 5.5 06/11/2021 1432   GLUCOSEU NEGATIVE 06/11/2021 1432   HGBUR MODERATE (A) 06/11/2021 1432   BILIRUBINUR NEGATIVE 06/11/2021 1432   BILIRUBINUR Neg 10/24/2013 1422   KETONESUR NEGATIVE 06/11/2021 1432   PROTEINUR 100 (A) 06/11/2021 1432   UROBILINOGEN 0.2 10/24/2013 1422   NITRITE NEGATIVE 06/11/2021 1432   LEUKOCYTESUR NEGATIVE 06/11/2021 1432   Sepsis Labs: Invalid input(s): PROCALCITONIN, [place]  Microbiology: Recent Results (from the past 240 hour(s))  Culture, blood (Routine x 2)     Status: None   Collection Time: 06/11/21  2:23 PM   Specimen: BLOOD LEFT HAND  Result Value Ref Range Status   Specimen  Description BLOOD LEFT HAND  Final   Special Requests   Final    BOTTLES DRAWN AEROBIC ONLY Blood Culture results may not be optimal due to an inadequate volume of blood received in culture bottles   Culture   Final    NO GROWTH 5 DAYS Performed at [facility], [place] [address] [place], [place] [address]    Report Status 06/16/2021 FINAL  Final  Urine Culture     Status: None   Collection Time: 06/11/21  2:24 PM   Specimen: In/Out Cath Urine   Result Value Ref Range Status   Specimen Description IN/OUT CATH URINE  Final   Special Requests NONE  Final   Culture   Final    NO GROWTH Performed at [facility], [place] [address] [place], [place] [address]    Report Status 06/12/2021 FINAL  Final  Resp Panel by RT-PCR (Flu A&B, Covid) Nasopharyngeal Swab     Status: None   Collection Time: 06/11/21  2:51 PM   Specimen: Nasopharyngeal Swab; Nasopharyngeal(NP) swabs in vial transport medium  Result Value Ref Range Status   SARS Coronavirus 2 by RT PCR NEGATIVE NEGATIVE Final    Comment: (NOTE) SARS-CoV-2 target nucleic acids are NOT DETECTED.  The SARS-CoV-2 RNA is generally detectable in upper respiratory specimens during the acute phase of infection. The lowest concentration of SARS-CoV-2 viral copies this assay can detect is 138 copies/mL. A negative result does not preclude SARS-Cov-2 infection and should not be used as the sole basis for treatment or other patient management decisions. A negative result may occur with  improper specimen collection/handling, submission of specimen other than nasopharyngeal swab, presence of viral mutation(s) within the areas targeted by this assay, and inadequate number of viral copies(<138 copies/mL). A negative result must be combined with clinical observations, patient history, and epidemiological information. The expected result is Negative.  Fact Sheet for Patients:  EntrepreneurPulse.com.au  Fact Sheet for Healthcare Providers:  IncredibleEmployment.be  This test is no t yet approved or cleared by the [place] FDA and  has been authorized for detection and/or diagnosis of SARS-CoV-2 by FDA under an Emergency Use Authorization (EUA). This EUA will remain  in effect (meaning this test can be used) for the duration of the COVID-19 declaration under Section 564(b)(1) of the Act, 21 U.S.C.section 360bbb-3(b)(1), unless the authorization is  terminated  or revoked sooner.       Influenza A by PCR NEGATIVE NEGATIVE Final   Influenza B by PCR NEGATIVE NEGATIVE Final    Comment: (NOTE) The Xpert Xpress SARS-CoV-2/FLU/RSV plus assay is intended as an aid in the diagnosis of influenza from Nasopharyngeal swab specimens and should not be used as a sole basis for treatment. Nasal washings and aspirates are unacceptable for Xpert Xpress SARS-CoV-2/FLU/RSV testing.  Fact Sheet for Patients: EntrepreneurPulse.com.au  Fact Sheet for Healthcare Providers: IncredibleEmployment.be  This test is not yet approved or cleared by the [place] FDA and has been authorized for detection and/or diagnosis of SARS-CoV-2 by FDA under an Emergency Use Authorization (EUA). This EUA will remain in effect (meaning this test can be used) for the duration of the COVID-19 declaration under Section 564(b)(1) of the Act, 21 U.S.C. section 360bbb-3(b)(1), unless the authorization is terminated or revoked.  Performed at [facility], [place] [address], [place], [place] [address]   Culture, blood (Routine x 2)     Status: Abnormal (Preliminary result)   Collection Time: 06/11/21  3:00 PM  Specimen: BLOOD RIGHT ARM  Result Value Ref Range Status   Specimen Description BLOOD RIGHT ARM  Final   Special Requests   Final    BOTTLES DRAWN AEROBIC AND ANAEROBIC Blood Culture adequate volume   Culture (A)  Final    HAEMOPHILUS INFLUENZAE BETA LACTAMASE POSITIVE CRITICAL RESULT CALLED TO, READ BACK BY AND VERIFIED WITH: PHARMD J.FRENS AT 3716 ON 06/13/2021 BY T.SAAD. HEALTH DEPARTMENT NOTIFIED Referred to [facility] in [place], [place] for serotyping. Performed at [facility], [place] [address] [place], [place] [address]    Report Status PENDING  Incomplete  CSF culture w Stat Gram Stain     Status: None   Collection Time: 06/12/21  3:40 PM   Specimen: CSF; Cerebrospinal Fluid  Result Value Ref  Range Status   Specimen Description CSF  Final   Special Requests Normal  Final   Gram Stain   Final    ABUNDANT WBC PRESENT,BOTH PMN AND MONONUCLEAR NO ORGANISMS SEEN    Culture   Final    NO GROWTH 3 DAYS Performed at [facility], [place] [address], [place], [place] [address]    Report Status 06/15/2021 FINAL  Final  MRSA Next Gen by PCR, Nasal     Status: None   Collection Time: 06/12/21  4:54 PM   Specimen: Nasal Mucosa; Nasal Swab  Result Value Ref Range Status   MRSA by PCR Next Gen NOT DETECTED NOT DETECTED Final    Comment: (NOTE) The GeneXpert MRSA Assay (FDA approved for NASAL specimens only), is one component of a comprehensive MRSA colonization surveillance program. It is not intended to diagnose MRSA infection nor to guide or monitor treatment for MRSA infections. Test performance is not FDA approved in patients less than 74 years old. Performed at [facility], [place] [address], [place],  [address]     Radiology Studies: No results found.    Berdine Rasmusson T. [place]  If 7PM-7AM, please contact night-coverage [email or web address] 06/18/2021, 11:48 AM

## 2021-06-19 DIAGNOSIS — G009 Bacterial meningitis, unspecified: Secondary | ICD-10-CM | POA: Diagnosis not present

## 2021-06-19 LAB — AMMONIA: Ammonia: 37 umol/L — ABNORMAL HIGH (ref 9–35)

## 2021-06-19 LAB — RENAL FUNCTION PANEL
Albumin: 2.5 g/dL — ABNORMAL LOW (ref 3.5–5.0)
Anion gap: 10 (ref 5–15)
BUN: <5 mg/dL — ABNORMAL LOW (ref 8–23)
CO2: 22 mmol/L (ref 22–32)
Calcium: 8.4 mg/dL — ABNORMAL LOW (ref 8.9–10.3)
Chloride: 104 mmol/L (ref 98–111)
Creatinine, Ser: 0.64 mg/dL (ref 0.44–1.00)
GFR, Estimated: >60 mL/min (ref >60)
Glucose, Bld: 116 mg/dL — ABNORMAL HIGH (ref 70–99)
Phosphorus: 3.5 mg/dL (ref 2.5–4.6)
Potassium: 3.8 mmol/L (ref 3.5–5.1)
Sodium: 136 mmol/L (ref 135–145)

## 2021-06-19 LAB — GLUCOSE, CAPILLARY
Glucose-Capillary: 106 mg/dL — ABNORMAL HIGH (ref 70–99)
Glucose-Capillary: 130 mg/dL — ABNORMAL HIGH (ref 70–99)
Glucose-Capillary: 114 mg/dL — ABNORMAL HIGH (ref 70–99)
Glucose-Capillary: 163 mg/dL — ABNORMAL HIGH (ref 70–99)

## 2021-06-19 LAB — HEMOGLOBIN AND HEMATOCRIT, BLOOD
HCT: 38.4 % (ref 36.0–46.0)
Hemoglobin: 13 g/dL (ref 12.0–15.0)

## 2021-06-19 LAB — MAGNESIUM: Magnesium: 1.9 mg/dL (ref 1.7–2.4)

## 2021-06-19 MED ORDER — METRONIDAZOLE 500 MG PO TABS
500.0000 mg | ORAL_TABLET | Freq: Three times a day (TID) | ORAL | Status: DC
  Administered 2021-06-19 – 2021-06-20 (×5): 500 mg via ORAL
  Filled 2021-06-19 (×5): qty 1

## 2021-06-19 NOTE — Progress Notes (Addendum)
Subjective:   Patient seen and evaluated at bedside this AM. Reports she is generally weak but otherwise feeling better. Denies headache, blurry vision, or decreased hearing. Discussed PT/OT and setting up for inpatient rehab.   Objective:  Vital signs in last 24 hours: Vitals:   06/18/21 2336 06/19/21 0430 06/19/21 0500 06/19/21 1221  BP: (!) 143/73 139/71  (!) 151/84  Pulse: 77 73  64  Resp: (!) 26 20  (!) 22  Temp: 98.4 F (36.9 C) 98.6 F (37 C)  98.7 F (37.1 C)  TempSrc: Oral Oral  Oral  SpO2: 92% 94%  93%  Weight:   123.5 kg   Height:       General: Chronically ill-appearing, no acute distress Pulm: Normal work of breathing on room air. MSK: Normal bulk, tone.  Neuro: Awake, alert, conversing appropriately. Psych: Normal mood, affect. Slowed speech.   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 Patient was transferred to ICU 1/4 with acute hypercarbic respiratory failure and was extubated on 1/6, although continued to require Precedex for agitation. Patient was able to wean off of Precedex and return to the floor 1/10. Initially [facility] had accepted patient from ICU, now transferred back to IMTS.  Principal Problem:   Acute bacterial meningitis Active Problems:   Diabetes mellitus, type 2 (HCC)   CSF leak   Acute metabolic encephalopathy   Altered mental status   Pressure injury of skin   Sepsis due to Haemophilus influenzae with acute hypoxic respiratory failure and septic shock (HCC)   Fever   Bacterial meningitis   Right sided weakness  #Acute bacterial meningitis  #Haemophilus influenza bacteremia #Chronic intermittent CSF leaks Patient markedly improved since prior to ICU stay. She is  currently hemodynamically stable on room air. She is alert and fully oriented without focal deficits. Laboratory work is also re-assuring. We will plan to continue with IV antibiotics for a total of 14 days, last day being 06/25/2021. Discussed with CIR, can either continue with peripheral IV or PICC line - should not impede discharge. Appreciate input from ID, will plan to obtain MRI brain in upcoming days. PO flagyl has been added per ID due to concern for acute sinusitis/mastoiditis. This morning patient reports that she chronically receives antibiotics for sinus infections given her history of CSF leaks. She has previously seen neurosurgeons for this problem ~20 years ago and decided to not pursue surgical intervention at that time. We discussed with Lisa [name] possibility of surgical intervention after recovering from this acute illness if she desires to pursue that route. Labs have been stable and re-assuring for the past few days, will take lab holiday.  - Continue IV ceftriaxone 2g daily, last day 06/25/2021 - Continue PO metronidazole 500mg  twice daily, last day 06/26/2021 - Will obtain MRI brain prior to d/c antibiotics - Appreciate ID recommendations  #Type II diabetes mellitus Last A1c 8.4% in 2015. Will need to repeat A1c at next lab check. CBG's have been well-controlled for the past few days. - SSI  #Deconditioning due to critical illness Patient remains profoundly weak following ICU stay. Continues to require assistance for bed motility and standing. Currently planning on CIR placement once available. - PT/OT - Pending CIR placement  Chronic: #Anxiety #Depression #Hypertension Stable, continuing with home medications.  Prior to Admission Living  Arrangement: Home Anticipated Discharge Location: CIR Barriers to Discharge: placement Dispo: Anticipated discharge in approximately 1-2 day(s).   Sanjuan Dame, MD 06/19/2021, 1:54 PM Pager: [phone] After 5pm on weekdays and  1pm on weekends: On Call pager [phone]

## 2021-06-19 NOTE — Progress Notes (Signed)
Physical Therapy Treatment Patient Details Name: Lisa [name] MRN: [id number] DOB: 01-16-1961 Today's Date: 06/19/2021   History of Present Illness 61 y/o female presented to ED on 06/11/21 for AMS after flu like symptoms. Head CT negative. Lumbar puncture on 1/4. CSF findings highly suspicious for bacterial meningitis. Intubated 1/4-1/6. After extubation, patient with mild R sided weakness. MRI with suspect minimal layering debris in ventricles suggesting ventriculitis but study limited by agitation and movement. 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    PT Comments    Pt received in supine, c/o depression due to prolonged time in bed and agreeable to therapy session, motivated to attempt bed>chair transfers. Pt able to perform transfers and bed mobility with up to +2 modA and needed totalA for bed>chair transfer with [facility] lift. Pt performed supine/seated LE and UE exercises with up to active assist and mod cues for technique. She would benefit from an HEP handout next session to reinforce exercises. Pt continues to benefit from PT services to progress toward functional mobility goals.    Recommendations for follow up therapy are one component of a multi-disciplinary discharge planning process, led by the attending physician.  Recommendations may be updated based on patient status, additional functional criteria and insurance authorization.  Follow Up Recommendations  Acute inpatient rehab (3hours/day)     Assistance Recommended at Discharge Frequent or constant Supervision/Assistance  Patient can return home with the following Two people to help with walking and/or transfers;Two people to help with bathing/dressing/bathroom;Assistance with cooking/housework;Assistance with feeding;Direct supervision/assist for financial management;Direct supervision/assist for medications management;Assist for transportation;Help with stairs or ramp for  entrance   Equipment Recommendations  Other (comment) (TBD, currently hospital bed, bari WC and mechanical lift)    Recommendations for Other Services Rehab consult     Precautions / Restrictions Precautions Precautions: Fall Restrictions Weight Bearing Restrictions: No     Mobility  Bed Mobility Overal bed mobility: Needs Assistance Bed Mobility: Supine to Sit;Sit to Supine Rolling: Mod assist;Min assist;+2 for physical assistance   Supine to sit: Mod assist;+2 for physical assistance (HOB <30 deg)     General bed mobility comments: cues for log roll sequencing, pt needing increased assist to roll to Lt side and minA only to Rt side, rolling x4 initially for placement of bed pan and clean-up.    Transfers Overall transfer level: Needs assistance Equipment used: Rolling walker (2 wheels) Transfers: Sit to/from Stand;Bed to chair/wheelchair/BSC Sit to Stand: +2 physical assistance;Mod assist;From elevated surface           General transfer comment: from elevated bed to [facility] with use of momentum and +2 modA to achieve upright, pt able to stand one minute prior to sitting on Stedy flaps; pt with poor eccentric control for stand>sit to chair from [place]. TotalA to chair in [place], pt unable to lift RLE for pivotal steps today Transfer via Lift Equipment: Stedy  Ambulation/Gait  Pre-gait activities: pt able to raise Rt heel in stance but unable to lift foot off floor or to raise Rt heel for weight shifting.        Balance Overall balance assessment: Needs assistance Sitting-balance support: No upper extremity supported;Feet supported Sitting balance-Leahy Scale: Fair     Standing balance support: Bilateral upper extremity supported Standing balance-Leahy Scale: Poor Standing balance comment: Reliant on BUE and external support +2         Cognition Arousal/Alertness: Awake/alert Behavior During Therapy: WFL for tasks assessed/performed Overall Cognitive  Status:  No family/caregiver present to determine baseline cognitive functioning            General Comments: Pt able to indicate needing BM today (limited warning) and appreciative of therapist assistance to get OOB to chair. Pt c/o "feeling depressed" in hospital. Good following of 1-step commands.        Exercises Other Exercises Other Exercises: supine BLE A/AAROM: ankle pumps, heel slides (AA), hip abduction (AA) x10 reps ea; seated hip flexion (AROM), LAQ x5 reps ea Seated BUE AROM: chest press x5 reps ea   General Comments General comments (skin integrity, edema, etc.): VSS on RA, no acute s/sx distress      Pertinent Vitals/Pain Pain Assessment: Faces Faces Pain Scale: Hurts little more Pain Location: "knees", possibly Rt>Lt Pain Descriptors / Indicators: Guarding;Grimacing Pain Intervention(s): Limited activity within patient's tolerance;Monitored during session;Repositioned           PT Goals (current goals can now be found in the care plan section) Acute Rehab PT Goals Patient Stated Goal: to get up to chair daily PT Goal Formulation: With patient Time For Goal Achievement: 06/29/21 Progress towards PT goals: Progressing toward goals    Frequency    Min 3X/week      PT Plan Current plan remains appropriate    Co-evaluation PT/OT/SLP Co-Evaluation/Treatment: Yes Reason for Co-Treatment: For patient/therapist safety;To address functional/ADL transfers PT goals addressed during session: Mobility/safety with mobility;Balance;Proper use of DME;Strengthening/ROM        AM-PAC PT "6 Clicks" Mobility   Outcome Measure  Help needed turning from your back to your side while in a flat bed without using bedrails?: A Lot Help needed moving from lying on your back to sitting on the side of a flat bed without using bedrails?: A Lot Help needed moving to and from a bed to a chair (including a wheelchair)?: Total Help needed standing up from a chair using your arms (e.g.,  wheelchair or bedside chair)?: A Lot Help needed to walk in hospital room?: Total Help needed climbing 3-5 steps with a railing? : Total 6 Click Score: 9    End of Session Equipment Utilized During Treatment: Gait belt Activity Tolerance: Patient tolerated treatment well Patient left: in chair;with call bell/phone within reach;with chair alarm set Nurse Communication: Mobility status;Need for lift equipment;Other (comment) (+2 with Stedy or hoyer lift back to bed; needs new purewick/canister full) PT Visit Diagnosis: Muscle weakness (generalized) (M62.81);Other abnormalities of gait and mobility (R26.89);Other symptoms and signs involving the nervous system (R29.898)     Time: 8250-5397 PT Time Calculation (min) (ACUTE ONLY): 38 min  Charges:  $Therapeutic Exercise: 8-22 mins $Therapeutic Activity: 8-22 mins                     Gaberial Cada P., PTA Acute Rehabilitation Services Pager: [phone] Office: [place] 06/19/2021, 4:35 PM

## 2021-06-19 NOTE — PMR Pre-admission (Signed)
PMR Admission Coordinator Pre-Admission Assessment  Patient: Lisa [name] is an 61 y.o., female MRN: [id number] DOB: 07/16/1960 Height: 5' 6"  (167.6 cm) Weight: 123.5 kg  Insurance Information HMO: yes    PPO:      PCP:      IPA:      80/20:      OTHER:  PRIMARY: [employer] Generic      Policy#: [id number]      Subscriber: pt CM Name: Luisa Hart      Phone#: [phone] ext [phone]     Fax#: [phone] Pre-Cert#: [id number]   approved for 7 days   Employer:  Benefits:  Phone #: [phone]     Name: 1/11 Eff. Date: 06/09/2021     Deduct: $800      Out of Pocket Max: $3000      Life Max: none CIR: 70%      SNF: 100% 60 days Outpatient: $20 per visit     Co-Pay: per medical neccesity Home Health: NO COVERAGE      Co-Pay:  DME: 100%     Co-Pay: none Providers: in network  SECONDARY: none        Financial Counselor:       Phone#:   [facility] [employer] for patients in Inpatient Rehabilitation Facilities with attached Privacy Act [place] Records was provided and verbally reviewed with: N/A  Emergency Contact Information Contact Information     Name Relation Home Work Mobile   [place] Spouse [phone]  [phone]       Current Medical History  Patient Admitting Diagnosis: Bacterial meningitis  History of Present Illness:  61 year old right-handed female with history of diabetes mellitus hypertension, hyperlipidemia and tobacco use as well as temporal bone abnormality leading to intermittent CSF leakage with failed attempt to close CSF leak in 2005 and patient has been followed at [facility] as well as [facility].    Presented 06/11/2021 with altered mental status and fever.  She was placed on broad-spectrum antibiotics.  Cranial CT scan showed no acute intracranial abnormality.  Complete opacification of both mastoid air cells.  MRI of the brain diffuse leptomeningeal enhancement involving both cerebral hemispheres, highly suggestive  of acute meningitis.  Patient did require intubation for airway protection and extubated 06/14/2021.  Lumbar puncture with 6004-25 white blood cells 84% neutrophils and abundant white blood cells but no organisms initially seen, protein 283 glucose 32.  Infectious disease consulted as well as neurology for H influenza bacteremia and meningitis in the setting of CSF leak.  Currently completing a 14-day course of IV ceftriaxone 2 g every 12 hours as well as IV/p.o. Flagyl 500 mg 3 times daily ending 06/25/2021.  Hospital course she did initially require Precedex for agitation.  Recommendations are repeat MRI of the brain before completion of antibiotic course per Dr. Drucilla Schmidt.  Lovenox initiated for DVT prophylaxis.  Currently on mechanical soft diet.  Therapy evaluations completed due to patient decreased functional ability altered mental status was admitted for a comprehensive rehab program.   Patient[facility] has been reviewed by the rehabilitation admission coordinator and physician.  Past Medical History  Past Medical History:  Diagnosis Date   Anxiety    Arthritis    Bulging disc L-5   Chicken pox    Depression    Diabetes mellitus    Hyperlipidemia    Hypertension    Insomnia    Major bone defects    Bilateral Temporal Bone defects with  CSF leak    Polycystic ovarian disease    Has the patient had major surgery during 100 days prior to admission? No  Family History   family history includes Arthritis in her father and mother; Breast cancer in her maternal grandmother and sister; Diabetes in her paternal grandmother and sister; Heart disease in her father; Hyperlipidemia in her sister; Hypertension in her maternal grandmother and mother; Marfan syndrome in her father and sister; Prostate cancer in her father.  Current Medications  Current Facility-Administered Medications:    acetaminophen (TYLENOL) tablet 650 mg, 650 mg, Oral, Q6H PRN, 650 mg at 06/16/21  1646 **OR** acetaminophen (TYLENOL) suppository 650 mg, 650 mg, Rectal, Q6H PRN, Skeet Simmer, RPH   ALPRAZolam Duanne Moron) tablet 0.25 mg, 0.25 mg, Oral, BID PRN, Corey Harold, NP, 0.25 mg at 06/19/21 2147   aspirin EC tablet 81 mg, 81 mg, Oral, Daily, Gonfa, Taye T, MD, 81 mg at 06/20/21 1001   buPROPion (WELLBUTRIN XL) 24 hr tablet 300 mg, 300 mg, Oral, Daily, Mannam, Praveen, MD, 300 mg at 06/20/21 1001   cefTRIAXone (ROCEPHIN) 2 g in sodium chloride 0.9 % 100 mL IVPB, 2 g, Intravenous, Q12H, Jose Persia, MD, Last Rate: 200 mL/hr at 06/19/21 2152, 2 g at 06/19/21 2152   diclofenac Sodium (VOLTAREN) 1 % topical gel 4 g, 4 g, Topical, Q6H PRN, Mannam, Praveen, MD, 4 g at 06/18/21 0026   enoxaparin (LOVENOX) injection 60 mg, 60 mg, Subcutaneous, Q24H, Dewald, Jonathan B, MD, 60 mg at 06/19/21 1800   insulin aspart (novoLOG) injection 0-15 Units, 0-15 Units, Subcutaneous, TID AC & HS, Mannam, Praveen, MD, 2 Units at 06/20/21 0733   lisinopril (ZESTRIL) tablet 5 mg, 5 mg, Oral, Daily, Gonfa, Taye T, MD, 5 mg at 06/20/21 1001   metroNIDAZOLE (FLAGYL) tablet 500 mg, 500 mg, Oral, Q8H, Manandhar, Sabina, MD, 500 mg at 06/20/21 0174   nicotine (NICODERM CQ - dosed in mg/24 hours) patch 14 mg, 14 mg, Transdermal, Q0600, Corey Harold, NP, 14 mg at 06/20/21 0611   ondansetron (ZOFRAN) injection 4 mg, 4 mg, Intravenous, Q8H PRN, Cyndia Skeeters, Taye T, MD, 4 mg at 06/20/21 1001   oxyCODONE (Oxy IR/ROXICODONE) immediate release tablet 5 mg, 5 mg, Oral, Q6H PRN, Mannam, Praveen, MD, 5 mg at 06/19/21 2244   pantoprazole (PROTONIX) EC tablet 40 mg, 40 mg, Oral, QHS, Alvira Philips, RPH, 40 mg at 06/19/21 2147   sertraline (ZOLOFT) tablet 100 mg, 100 mg, Oral, Daily, Alvira Philips, RPH, 100 mg at 06/20/21 1006   sodium chloride flush (NS) 0.9 % injection 3 mL, 3 mL, Intravenous, Q12H, Jose Persia, MD, 3 mL at 06/20/21 1006   zolpidem (AMBIEN) tablet 5 mg, 5 mg, Oral, QHS PRN, Corey Harold, NP, 5 mg  at 06/19/21 2244  Patients Current Diet:  Diet Order             DIET DYS 3 Room service appropriate? Yes with Assist; Fluid consistency: Thin  Diet effective now                  Precautions / Restrictions Precautions Precautions: Fall Precaution Comments: wrist restraints, mittens Restrictions Weight Bearing Restrictions: No   Has the patient had 2 or more falls or a fall with injury in the past year? No  Prior Activity Level Limited Community (1-2x/wk): Mod I with RW but very [place] over past 2 years per spouse  Prior Functional Level Self Care: Did the patient need help bathing,  dressing, using the toilet or eating? Independent  Indoor Mobility: Did the patient need assistance with walking from room to room (with or without device)? Independent  Stairs: Did the patient need assistance with internal or external stairs (with or without device)? Independent  Functional Cognition: Did the patient need help planning regular tasks such as shopping or remembering to take medications? Independent  Patient Information Are you of Hispanic, Latino/a,or Spanish origin?: A. No, not of Hispanic, Latino/a, or Spanish origin What is your race?: A. White Do you need or want an interpreter to communicate with a doctor or health care staff?: 0. No  Patient's Response To:  Health Literacy and Transportation Is the patient able to respond to health literacy and transportation needs?: Yes Health Literacy - How often do you need to have someone help you when you read instructions, pamphlets, or other written material from your doctor or pharmacy?: Never In the past 12 months, has lack of transportation kept you from medical appointments or from getting medications?: No In the past 12 months, has lack of transportation kept you from meetings, work, or from getting things needed for daily living?: No  Home Assistive Devices / Equipment Home Equipment: Grab bars - tub/shower, Hand held  shower head, Shower seat, Rollator (4 wheels), Cane - single point  Prior Device Use: Indicate devices/aids used by the patient prior to current illness, exacerbation or injury?  rollator  Current Functional Level Cognition  Overall Cognitive Status: No family/caregiver present to determine baseline cognitive functioning Difficult to assess due to: Level of arousal Orientation Level: Oriented X4 General Comments: Pt able to indicate needing BM today (limited warning) and appreciative of therapist assistance to get OOB to chair. Pt c/o "feeling depressed" in hospital. Good following of 1-step commands.    Extremity Assessment (includes Sensation/Coordination)  Upper Extremity Assessment: Generalized weakness, RUE deficits/detail RUE Deficits / Details: h/o R shoulder pain for about 1 month. Pt awaiting MRI for R shoulder today. able to raise shoulder enough to hold onto rw on right side while seated RUE: Shoulder pain at rest  Lower Extremity Assessment: Defer to PT evaluation    ADLs  Overall ADL's : Needs assistance/impaired Eating/Feeding: Set up, Sitting Eating/Feeding Details (indicate cue type and reason): Left eating late lunch Grooming: Wash/dry face, Wash/dry hands, Set up, Sitting Grooming Details (indicate cue type and reason): completed EOB Upper Body Bathing: Total assistance, Bed level Lower Body Bathing: Total assistance, Bed level Lower Body Bathing Details (indicate cue type and reason): Completed s/p BM on bed pan Lower Body Dressing: Maximal assistance, Sitting/lateral leans Lower Body Dressing Details (indicate cue type and reason): attempted to don socks this session, able to reach top of foot, unable to get over toes before trunk fatiguing. Toileting- Clothing Manipulation and Hygiene: Total assistance, Bed level General ADL Comments: Pt requiring increased assist for LB ADL's and Functional mobility.    Mobility  Overal bed mobility: Needs Assistance Bed  Mobility: Supine to Sit, Sit to Supine Rolling: Mod assist, Min assist, +2 for physical assistance Supine to sit: Mod assist, +2 for physical assistance (HOB <30 deg) Sit to supine: Mod assist, +2 for physical assistance General bed mobility comments: cues for log roll sequencing, pt needing increased assist to roll to Lt side and minA only to Rt side, rolling x4 initially for placement of bed pan and clean-up.    Transfers  Overall transfer level: Needs assistance Equipment used: Rolling walker (2 wheels) Transfers: Sit to/from Stand, Bed to chair/wheelchair/BSC  Sit to Stand: +2 physical assistance, Mod assist, From elevated surface Bed to/from chair/wheelchair/BSC transfer type:: Via Lift equipment Transfer via Lift Equipment: Nect[place] transfer comment: from elevated bed to [place] with use of momentum and +2 modA to achieve upright, pt able to stand one minute prior to sitting on Stedy flaps; pt with poor eccentric control for stand>sit to chair from [place]. TotalA to chair in [place], pt unable to lift RLE for pivotal steps today    Ambulation / Gait / Stairs / Wheelchair Mobility  Ambulation/Gait Pre-gait activities: pt able to raise Rt heel in stance but unable to lift foot off floor or to raise Rt heel for weight shifting.    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   Previous Home Environment  Living Arrangements: Spouse/significant other (adult daughter lives with them)  Lives With: Spouse, Daughter Available Help at Discharge:  (spouse would arrnage 24/7 assist) Type of Home: House Home  Layout: Two level, Able to live on main level with bedroom/bathroom Home Access: Stairs to enter [employer] of Steps: 1 Bathroom Shower/Tub: Chiropodist: Standard Bathroom Accessibility: Yes How Accessible: Accessible via walker [place]: No  Discharge Living Setting Plans for Discharge Living Setting: Patient's home, Lives with (comment) (spouse and adult daughter) Type of Home at Discharge: House Discharge Home Layout: Two level, Able to live on main level with bedroom/bathroom Discharge Home Access: Stairs to enter Entrance Stairs-Rails: None Entrance Stairs-Number of Steps: 1 Discharge Bathroom Shower/Tub: Tub/shower unit Discharge Bathroom Toilet: Standard Discharge Bathroom Accessibility: Yes How Accessible: Accessible via walker Does the patient have any problems obtaining your medications?: Yes (Describe)  Social/Family/Support Systems Patient Roles: Spouse, Parent Contact Information: spouse, Shaun Anticipated Caregiver: spouse Shaun and adult daughter Anticipated Caregiver's Contact Information: see contacts Ability/Limitations of Caregiver: 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[place], commute from [place] Availability: 24/7 (spouse will arrange 24/7) Discharge Plan Discussed with Primary Caregiver: Yes Is Caregiver In Agreement with Plan?: Yes Does Caregiver/Family have Issues with Lodging/Transportation while Pt is in Rehab?: No  Goals Patient/Family Goal for Rehab: supervision to min assist with PT and OT Expected length of stay: ELOS 10 to 14 days Pt/Family Agrees to Admission and willing to participate: Yes Program Orientation Provided & Reviewed with Pt/Caregiver Including Roles  & Responsibilities: Yes  Decrease burden of Care through IP rehab admission: n/a  Possible need for SNF placement upon discharge: not anticipated  Patient Condition: I have reviewed medical records from [facility] , spoken with CM,  and patient and spouse. I met with patient at the bedside for inpatient rehabilitation assessment.  Patient will benefit from ongoing PT and OT, can actively participate in 3 hours of therapy a day 5 days of the week, and can make measurable gains during the admission.  Patient will also benefit from the coordinated team approach during an Inpatient Acute Rehabilitation admission.  The patient will receive intensive therapy as well as Rehabilitation physician, nursing, social worker, and care management interventions.  Due to bladder management, bowel management, safety, skin/wound care, disease management, medication administration, pain management, and patient education the patient requires 24 hour a day rehabilitation nursing.  The patient  is currently max assist overall  with mobility and basic ADLs.  Discharge setting and therapy post discharge at home with home health is anticipated.  Patient has agreed to participate in the Acute Inpatient Rehabilitation Program and will admit today.  Preadmission Screen Completed By:  Cleatrice Burke, 06/20/2021 11:11 AM ______________________________________________________________________   Discussed status with Dr. Ranell Patrick on 06/20/2021 at 8 and received approval for admission today.  Admission Coordinator:  Cleatrice Burke, RN, time  0034 Date 06/20/2021   Assessment/Plan: Diagnosis: Bacterial meningitis Does the need for close, 24 hr/day Medical supervision in concert with the patient's rehab needs make it unreasonable for this patient to be served in a less intensive setting? Yes Co-Morbidities requiring supervision/potential complications: morbid obesity, right knee osteoarthritis/pain, type 2 DM, CSK leak, metabolic encephalopathy Due to bladder management, bowel management, safety, skin/wound care, disease management, medication administration, pain management, and patient education, does the patient require 24 hr/day rehab nursing?  Yes Does the patient require coordinated care of a physician, rehab nurse, PT, OT, and SLP to address physical and functional deficits in the context of the above medical diagnosis(es)? Yes Addressing deficits in the following areas: balance, endurance, locomotion, strength, transferring, bowel/bladder control, bathing, dressing, feeding, grooming, toileting, and psychosocial support Can the patient actively participate in an intensive therapy program of at least 3 hrs of therapy 5 days a week? Yes The potential for patient to make measurable gains while on inpatient rehab is excellent Anticipated functional outcomes upon discharge from inpatient rehab: min assist PT, min assist OT, supervision and min assist SLP Estimated rehab length of stay to reach the above functional goals is: 20 days Anticipated discharge destination: Home 10. Overall Rehab/Functional Prognosis: fair   MD Signature: Leeroy Cha, MD

## 2021-06-19 NOTE — Progress Notes (Incomplete)
HD#8 Subjective:  Patient Summary: Lisa [name] is a 61 year old female with PMHx of *** admitted   Overnight Events: No acute overnight events reported.  Interim History:   Objective:  Vital signs in last 24 hours: Vitals:   06/18/21 1945 06/18/21 2336 06/19/21 0430 06/19/21 0500  BP: 140/72 (!) 143/73 139/71   Pulse: 72 77 73   Resp: 17 (!) 26 20   Temp: 98.6 F (37 C) 98.4 F (36.9 C) 98.6 F (37 C)   TempSrc: Oral Oral Oral   SpO2: 97% 92% 94%   Weight:    123.5 kg  Height:       Supplemental O2: Nasal Cannula SpO2: 94 % O2 Flow Rate (L/min): 2 L/min FiO2 (%): 40 %   Physical Exam:  Constitutional: well-appearing *** sitting in ***, in no acute distress HENT: normocephalic atraumatic, mucous membranes moist Eyes: conjunctiva non-erythematous Neck: supple Cardiovascular: regular rate and rhythm, no m/r/g Pulmonary/Chest: normal work of breathing on room air, lungs clear to auscultation bilaterally Abdominal: soft, non-tender, non-distended MSK: normal bulk and tone Neurological: alert & oriented x 3, 5/5 strength in bilateral upper and lower extremities, normal gait Skin: warm and dry Psych: ***  Filed Weights   06/17/21 0500 06/18/21 0258 06/19/21 0500  Weight: 117 kg 128.6 kg 123.5 kg     Intake/Output Summary (Last 24 hours) at 06/19/2021 0708 Last data filed at 06/19/2021 0430 Gross per 24 hour  Intake 998.35 ml  Output 1650 ml  Net -651.65 ml   Net IO Since Admission: 9,061.99 mL [06/19/21 0708]  Pertinent Labs: CBC Latest Ref Rng & Units 06/19/2021 06/18/2021 06/17/2021  WBC 4.0 - 10.5 K/uL - 9.5 10.5  Hemoglobin 12.0 - 15.0 g/dL 13.0 11.6(L) 11.1(L)  Hematocrit 36.0 - 46.0 % 38.4 34.2(L) 34.3(L)  Platelets 150 - 400 K/uL - 353 347    CMP Latest Ref Rng & Units 06/18/2021 06/17/2021 06/16/2021  Glucose 70 - 99 mg/dL 119(H) 106(H) 95  BUN 8 - 23 mg/dL 5(L) 12 17  Creatinine 0.44 - 1.00 mg/dL 0.56 0.75 0.73  Sodium 135 - 145 mmol/L 138 139  142  Potassium 3.5 - 5.1 mmol/L 3.5 3.3(L) 3.1(L)  Chloride 98 - 111 mmol/L 107 105 107  CO2 22 - 32 mmol/L 24 23 21(L)  Calcium 8.9 - 10.3 mg/dL 7.8(L) 8.1(L) 8.4(L)  Total Protein 6.5 - 8.1 g/dL - - -  Total Bilirubin 0.3 - 1.2 mg/dL - - -  Alkaline Phos 38 - 126 U/L - - -  AST 15 - 41 U/L - - -  ALT 0 - 44 U/L - - -    Imaging: MR Shoulder Right W Wo Contrast  Result Date: 06/18/2021 CLINICAL DATA:  Septic arthritis suspected, shoulder, xray done EXAM: MRI OF THE RIGHT SHOULDER WITHOUT AND WITH CONTRAST TECHNIQUE: Multiplanar, multisequence MR imaging of the shoulder was performed before and after the administration of intravenous contrast. CONTRAST:  83mL GADAVIST GADOBUTROL 1 MMOL/ML IV SOLN COMPARISON:  None. FINDINGS: Rotator cuff: There is mild distal supraspinatus infraspinatus tendinosis. There is focal high grade, partial width tearing of the far anterior fibers of the supraspinatus tendon at the footprint (coronal T2 image 12. Teres minor tendon is intact. Subscapularis tendon is intact. Muscles: No significant muscle atrophy. Streaky muscle edema in the anterior and mid fibers of the deltoid. Biceps Long Head: Intraarticular and extraarticular portions of the biceps tendon are intact. Acromioclavicular Joint: Moderate arthropathy of the acromioclavicular joint. Small amount  of subacromial/subdeltoid bursal fluid. Glenohumeral Joint: No significant joint effusion.  Mild chondrosis. Labrum: Grossly intact, but evaluation is limited by lack of intraarticular fluid/contrast. Bones: No fracture or dislocation. No aggressive osseous lesion. Mild generalized low T1 marrow signal, as can be seen in hematologic derangement. Other: No fluid collection or hematoma. IMPRESSION: Mild distal supraspinatus and infraspinatus tendinosis. Focal high-grade, partial width tearing of the far anterior fibers of the supraspinatus tendon at the footprint. Mild subacromial-subdeltoid bursitis. No convincing  findings to suggest septic arthritis. No significant joint effusion or evidence of osteomyelitis. 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 Mild generalized low T1 marrow signal which, can be seen in the setting of hematologic derangement including anemia, correlate with CBC. Electronically Signed   By: Maurine Simmering M.D.   On: 06/18/2021 15:58    Assessment/Plan:   Principal Problem:   Acute bacterial meningitis Active Problems:   Diabetes mellitus, type 2 (HCC)   CSF leak   Acute metabolic encephalopathy   Altered mental status   Pressure injury of skin   Sepsis due to Haemophilus influenzae with acute hypoxic respiratory failure and septic shock (HCC)   Fever   Bacterial meningitis   Right sided weakness   Patient Summary: Lisa [name] is a 61 y.o. with a pertinent PMH of ***, who presented with *** and admitted for ***.    *** ***  *** ***  *** ***  *** ***  Diet: {NAMES:3044014::"Normal","Heart Healthy","Carb-Modified","Renal","Carb/Renal","NPO","TPN","Tube Feeds"} IVF: {NAMES:3044014::"None","NS","1/2 NS","LR","D5","D10"},{NAMES:3044014::"None","10cc/hr","25cc/hr","50cc/hr","75cc/hr","100cc/hr","110cc/hr","125cc/hr","Bolus"} VTE: {NAMES:3044014::"Heparin","Enoxaparin","SCDs","NOAC","None"} Code: {NAMES:3044014::"Full","DNR","DNI","DNR/DNI","Comfort Care","Unknown"} PT/OT recs: {NAMES:3044014::"None","Pending","CIR","SNF for Subacute PT","LTAC","Home Health"}, {Assistive Devices XTG:62694}. TOC recs: *** Family Update:  Prior to Admission Living Arrangement: *** Anticipated Discharge Location: *** Barriers to Discharge: *** Dispo: Anticipated discharge to {Discharge Destination:18313::"Home"} in {NUMBERS:20191} days pending ***.   Harvie Heck, MD Internal Medicine Resident PGY-3 Pager# [phone]  Please contact the on call pager after 5 pm and on weekends at [phone].

## 2021-06-19 NOTE — Progress Notes (Signed)
Speech Language Pathology Treatment: Dysphagia  Patient Details Name: Lisa [name] MRN: [id number] DOB: [birth year] Today's Date: 06/19/2021 Time: 4270-6237 SLP Time Calculation (min) (ACUTE ONLY): 21 min  Assessment / Plan / Recommendation Clinical Impression  Pt continues to report poor appetite, although consented to PO trials this date to assess tolerance of dys 3, thin liquid diet. Sitting upright in bed, pt consumed self-paced single sips of thin liquids demonstrating x1 immediate throat clear across multiple trials. X1 throat clear also exhibited during consumption of dys 3/mixed consistency solid, which may have been affected by attempt to talk and coordinate swallow at the same time. Oral clearance of solids was complete upon inspection. She refused advanced trials of regular textures (graham cracker) as she reports that dry foods often get stuck in her throat. She prefers to remain on dys 3, thin liquid diet at this time. Educated regarding basic precautions to reduce risk for aspiration including: upright positioning during PO intake, small bites/sips at slow rate. SLP to f/u for advanced trials to determine LRD and further training in basic swallow strategies/precautions.    HPI HPI: Pt is a 61 y/o female who presented to the ED on 06/11/21 for AMS after flu like symptoms. Head CT negative. Lumbar puncture on 1/4. CSF findings highly suspicious for bacterial meningitis. Intubated 1/4-1/6. After extubation, patient with mild R sided weakness. MRI with suspect minimal layering debris in ventricles suggesting ventriculitis but study limited by agitation and movement. 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  Recommendations may be updated based  on patient status, additional functional criteria and insurance authorization.    Recommendations  Diet recommendations: Dysphagia 3 (mechanical soft);Thin liquid Liquids provided via: Cup;Straw Medication Administration: Whole meds with puree Supervision: Staff to assist with self feeding;Intermittent supervision to cue for compensatory strategies Compensations: Slow rate;Small sips/bites Postural Changes and/or Swallow Maneuvers: Seated upright 90 degrees                Oral Care Recommendations: Oral care BID Follow Up Recommendations: Acute inpatient rehab (3hours/day) Assistance recommended at discharge: Intermittent Supervision/Assistance SLP Visit Diagnosis: Dysphagia, unspecified (R13.10) Plan: Continue with current plan of care         Ellwood Dense, Lisa [name], Lisa [name] Office Number: [place]  06/19/2021, 1:06 PM

## 2021-06-19 NOTE — Progress Notes (Signed)
Inpatient Rehabilitation Admissions Coordinator   I met at bedside with patient and her spouse . W discussed goals and expectations of a possible [facility] admit and needed caregiver supports at home after rehab. They are in agreement and prefer Cir . I will begin Auth with [employer] for a possible admit. I would also encourage nursing and therapy to assist patient up into chair today for she states she has not been in chair since admission.  Danne Baxter, RN, MSN Rehab Admissions Coordinator [phone] 06/19/2021 8:20 AM

## 2021-06-19 NOTE — Progress Notes (Signed)
Occupational Therapy Treatment Patient Details Name: Lisa [name] MRN: [id number] DOB: February 02, 1961 Today's Date: 06/19/2021   History of present illness 61 y/o female presented to ED on 06/11/21 for AMS after flu like symptoms. Head CT negative. Lumbar puncture on 1/4. CSF findings highly suspicious for bacterial meningitis. Intubated 1/4-1/6. After extubation, patient with mild R sided weakness. MRI with suspect minimal layering debris in ventricles suggesting ventriculitis but study limited by agitation and movement. 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   OT comments  Pt making good progress with OT goals this session. She tolerated toileting, LB bathing, grooming, exercises and sit<>stands in the steady this session. Due to weakness, pt continues to requiring mod-max A +2 for bed mobility and sit<>stands, however once sitting, pt is able to complete dynamic tasks with no support for balance and in standing, pt is able to stand for over a min in the steady. Pt was very grateful to be able to get out of bed and work on standing. OT will continue to follow acutely and recommend AIR to maximize pt's independence.    Recommendations for follow up therapy are one component of a multi-disciplinary discharge planning process, led by the attending physician.  Recommendations may be updated based on patient status, additional functional criteria and insurance authorization.    Follow Up Recommendations  Acute inpatient rehab (3hours/day)    Assistance Recommended at Discharge Frequent or constant Supervision/Assistance  Patient can return home with the following  Two people to help with walking and/or transfers;Assistance with feeding   Equipment Recommendations  Other (comment)    Recommendations for Other Services      Precautions / Restrictions Precautions Precautions: Fall Restrictions Weight Bearing Restrictions: No        Mobility Bed Mobility Overal bed mobility: Needs Assistance Bed Mobility: Supine to Sit;Sit to Supine Rolling: Mod assist;Min assist;+2 for physical assistance   Supine to sit: Mod assist;+2 for physical assistance (HOB <30 deg)     General bed mobility comments: cues for log roll sequencing, pt needing increased assist to roll to Lt side and minA only to Rt side, rolling x4 initially for placement of bed pan and clean-up.    Transfers Overall transfer level: Needs assistance Equipment used: Rolling walker (2 wheels) Transfers: Sit to/from Stand;Bed to chair/wheelchair/BSC Sit to Stand: +2 physical assistance;Mod assist;From elevated surface           General transfer comment: from elevated bed to [facility] with use of momentum and +2 modA to achieve upright, pt able to stand one minute prior to sitting on Stedy flaps; pt with poor eccentric control for stand>sit to chair from [place]. TotalA to chair in [place], pt unable to lift RLE for pivotal steps today Transfer via Lift Equipment: Stedy   Balance Overall balance assessment: Needs assistance Sitting-balance support: No upper extremity supported;Feet supported Sitting balance-Leahy Scale: Fair     Standing balance support: Bilateral upper extremity supported Standing balance-Leahy Scale: Poor Standing balance comment: Reliant on BUE and external support +2                           ADL either performed or assessed with clinical judgement   ADL Overall ADL's : Needs assistance/impaired Eating/Feeding: Set up;Sitting Eating/Feeding Details (indicate cue type and reason): Left eating late lunch Grooming: Wash/dry face;Wash/dry hands;Set up;Sitting Grooming Details (indicate cue type and reason): completed EOB     Lower  Body Bathing: Total assistance;Bed level Lower Body Bathing Details (indicate cue type and reason): Completed s/p BM on bed pan     Lower Body Dressing: Maximal assistance;Sitting/lateral  leans Lower Body Dressing Details (indicate cue type and reason): attempted to don socks this session, able to reach top of foot, unable to get over toes before trunk fatiguing.               General ADL Comments: Pt requiring increased assist for LB ADL's and Functional mobility.    Extremity/Trunk Assessment              Vision       Perception     Praxis      Cognition Arousal/Alertness: Awake/alert Behavior During Therapy: WFL for tasks assessed/performed Overall Cognitive Status: No family/caregiver present to determine baseline cognitive functioning                                 General Comments: Pt able to indicate needing BM today (limited warning) and appreciative of therapist assistance to get OOB to chair. Pt c/o "feeling depressed" in hospital. Good following of 1-step commands.          Exercises Exercises: Other exercises Other Exercises Other Exercises: Supine BUE: punch ups, hand squeezes, bicep curls, reach acrosses - all x10 Other Exercises: Seated BUE: bicept curls, lateral raises, punch outs - x10 reps   Shoulder Instructions       General Comments VSS on RA, pt reporting that she is depressed due to her situation, but very grateful to be getting out of bed and moving some.    Pertinent Vitals/ Pain       Pain Assessment: Faces Faces Pain Scale: Hurts little more Pain Location: "knees", possibly Rt>Lt Pain Descriptors / Indicators: Guarding;Grimacing Pain Intervention(s): Limited activity within patient's tolerance;Monitored during session;Repositioned  Home Living                                          Prior Functioning/Environment              Frequency  Min 2X/week        Progress Toward Goals  OT Goals(current goals can now be found in the care plan section)  Progress towards OT goals: Progressing toward goals  Acute Rehab OT Goals OT Goal Formulation: With patient Time For Goal  Achievement: 06/29/21 Potential to Achieve Goals: Good ADL Goals Pt Will Perform Grooming: with min assist;sitting Pt Will Perform Upper Body Bathing: with min assist;sitting Pt Will Perform Upper Body Dressing: with min assist;sitting Pt Will Perform Lower Body Dressing: with mod assist;sit to/from stand;with adaptive equipment Pt Will Transfer to Toilet: with mod assist;stand pivot transfer;squat pivot transfer;bedside commode Pt Will Perform Toileting - Clothing Manipulation and hygiene: with mod assist;sit to/from stand;sitting/lateral leans Additional ADL Goal #1: Patient will be Mod A for supine to sit to increase independence with toileting  Plan Discharge plan remains appropriate    Co-evaluation    PT/OT/SLP Co-Evaluation/Treatment: Yes Reason for Co-Treatment: For patient/therapist safety;To address functional/ADL transfers PT goals addressed during session: Mobility/safety with mobility;Balance;Proper use of DME;Strengthening/ROM OT goals addressed during session: ADL's and self-care;Strengthening/ROM      AM-PAC OT "6 Clicks" Daily Activity     Outcome Measure   Help from another person eating meals?: A Little  Help from another person taking care of personal grooming?: A Little Help from another person toileting, which includes using toliet, bedpan, or urinal?: Total Help from another person bathing (including washing, rinsing, drying)?: A Lot Help from another person to put on and taking off regular upper body clothing?: A Little Help from another person to put on and taking off regular lower body clothing?: A Lot 6 Click Score: 14    End of Session Equipment Utilized During Treatment: Gait belt;Other (comment) (stedy)  OT Visit Diagnosis: Unsteadiness on feet (R26.81);Muscle weakness (generalized) (M62.81)   Activity Tolerance Patient tolerated treatment well   Patient Left in chair;with call bell/phone within reach;with chair alarm set   Nurse Communication  Mobility status        Time: 3646-8032 OT Time Calculation (min): 53 min  Charges: OT General Charges $OT Visit: 1 Visit OT Treatments $Self Care/Home Management : 8-22 mins $Therapeutic Activity: 8-22 mins  Ray Glacken H., OTR/L Acute Rehabilitation  [place] Elane Wray Goehring 06/19/2021, 5:17 PM

## 2021-06-19 NOTE — Plan of Care (Signed)
Pt had a few mushy BM's today. MRI done of the right shoulder. Husband has been at bedside.  Problem: Education: Goal: Knowledge of General Education information will improve Description: Including pain rating scale, medication(s)/side effects and non-pharmacologic comfort measures Outcome: Progressing   Problem: Health Behavior/Discharge Planning: Goal: Ability to manage health-related needs will improve Outcome: Progressing   Problem: Clinical Measurements: Goal: Ability to maintain clinical measurements within normal limits will improve Outcome: Progressing Goal: Will remain free from infection Outcome: Progressing Goal: Diagnostic test results will improve Outcome: Progressing Goal: Respiratory complications will improve Outcome: Progressing Goal: Cardiovascular complication will be avoided Outcome: Progressing   Problem: Activity: Goal: Risk for activity intolerance will decrease Outcome: Progressing   Problem: Nutrition: Goal: Adequate nutrition will be maintained Outcome: Progressing   Problem: Coping: Goal: Level of anxiety will decrease Outcome: Progressing   Problem: Elimination: Goal: Will not experience complications related to bowel motility Outcome: Progressing Goal: Will not experience complications related to urinary retention Outcome: Progressing   Problem: Pain Managment: Goal: General experience of comfort will improve Outcome: Progressing   Problem: Safety: Goal: Ability to remain free from injury will improve Outcome: Progressing   Problem: Skin Integrity: Goal: Risk for impaired skin integrity will decrease Outcome: Progressing

## 2021-06-20 ENCOUNTER — Inpatient Hospital Stay: Payer: Self-pay

## 2021-06-20 ENCOUNTER — Inpatient Hospital Stay (HOSPITAL_COMMUNITY)
Admission: RE | Admit: 2021-06-20 | Discharge: 2021-07-02 | DRG: 092 | Disposition: A | Discharge status: Home / Self Care | Payer: Commercial Managed Care - HMO | Source: Intra-hospital | Attending: Physical Medicine & Rehabilitation | Admitting: Physical Medicine & Rehabilitation

## 2021-06-20 ENCOUNTER — Encounter (HOSPITAL_COMMUNITY): Payer: Self-pay | Admitting: Physical Medicine & Rehabilitation

## 2021-06-20 ENCOUNTER — Other Ambulatory Visit: Payer: Self-pay

## 2021-06-20 DIAGNOSIS — L89156 Pressure-induced deep tissue damage of sacral region: Secondary | ICD-10-CM | POA: Diagnosis present

## 2021-06-20 DIAGNOSIS — G4700 Insomnia, unspecified: Secondary | ICD-10-CM | POA: Diagnosis present

## 2021-06-20 DIAGNOSIS — Z794 Long term (current) use of insulin: Secondary | ICD-10-CM

## 2021-06-20 DIAGNOSIS — Z803 Family history of malignant neoplasm of breast: Secondary | ICD-10-CM | POA: Diagnosis not present

## 2021-06-20 DIAGNOSIS — G009 Bacterial meningitis, unspecified: Secondary | ICD-10-CM | POA: Diagnosis not present

## 2021-06-20 DIAGNOSIS — Z8261 Family history of arthritis: Secondary | ICD-10-CM | POA: Diagnosis not present

## 2021-06-20 DIAGNOSIS — Z83438 Family history of other disorder of lipoprotein metabolism and other lipidemia: Secondary | ICD-10-CM | POA: Diagnosis not present

## 2021-06-20 DIAGNOSIS — Z8661 Personal history of infections of the central nervous system: Secondary | ICD-10-CM | POA: Diagnosis not present

## 2021-06-20 DIAGNOSIS — R2689 Other abnormalities of gait and mobility: Secondary | ICD-10-CM | POA: Diagnosis present

## 2021-06-20 DIAGNOSIS — Z6841 Body Mass Index (BMI) 40.0 and over, adult: Secondary | ICD-10-CM

## 2021-06-20 DIAGNOSIS — Z8042 Family history of malignant neoplasm of prostate: Secondary | ICD-10-CM

## 2021-06-20 DIAGNOSIS — R7401 Elevation of levels of liver transaminase levels: Secondary | ICD-10-CM | POA: Diagnosis present

## 2021-06-20 DIAGNOSIS — Z833 Family history of diabetes mellitus: Secondary | ICD-10-CM

## 2021-06-20 DIAGNOSIS — M19011 Primary osteoarthritis, right shoulder: Secondary | ICD-10-CM | POA: Diagnosis present

## 2021-06-20 DIAGNOSIS — F419 Anxiety disorder, unspecified: Secondary | ICD-10-CM | POA: Diagnosis present

## 2021-06-20 DIAGNOSIS — Z79899 Other long term (current) drug therapy: Secondary | ICD-10-CM | POA: Diagnosis not present

## 2021-06-20 DIAGNOSIS — N63 Unspecified lump in unspecified breast: Secondary | ICD-10-CM

## 2021-06-20 DIAGNOSIS — L89151 Pressure ulcer of sacral region, stage 1: Secondary | ICD-10-CM | POA: Diagnosis present

## 2021-06-20 DIAGNOSIS — E785 Hyperlipidemia, unspecified: Secondary | ICD-10-CM | POA: Diagnosis present

## 2021-06-20 DIAGNOSIS — M170 Bilateral primary osteoarthritis of knee: Secondary | ICD-10-CM | POA: Diagnosis present

## 2021-06-20 DIAGNOSIS — E119 Type 2 diabetes mellitus without complications: Secondary | ICD-10-CM | POA: Diagnosis present

## 2021-06-20 DIAGNOSIS — R059 Cough, unspecified: Secondary | ICD-10-CM

## 2021-06-20 DIAGNOSIS — I10 Essential (primary) hypertension: Secondary | ICD-10-CM | POA: Diagnosis present

## 2021-06-20 DIAGNOSIS — N6320 Unspecified lump in the left breast, unspecified quadrant: Secondary | ICD-10-CM | POA: Diagnosis present

## 2021-06-20 DIAGNOSIS — F17210 Nicotine dependence, cigarettes, uncomplicated: Secondary | ICD-10-CM | POA: Diagnosis present

## 2021-06-20 DIAGNOSIS — Z8249 Family history of ischemic heart disease and other diseases of the circulatory system: Secondary | ICD-10-CM

## 2021-06-20 DIAGNOSIS — Z8279 Family history of other congenital malformations, deformations and chromosomal abnormalities: Secondary | ICD-10-CM

## 2021-06-20 DIAGNOSIS — R531 Weakness: Secondary | ICD-10-CM | POA: Diagnosis present

## 2021-06-20 DIAGNOSIS — E669 Obesity, unspecified: Secondary | ICD-10-CM | POA: Diagnosis present

## 2021-06-20 DIAGNOSIS — Z7982 Long term (current) use of aspirin: Secondary | ICD-10-CM

## 2021-06-20 DIAGNOSIS — F32A Depression, unspecified: Secondary | ICD-10-CM | POA: Diagnosis present

## 2021-06-20 DIAGNOSIS — E1169 Type 2 diabetes mellitus with other specified complication: Secondary | ICD-10-CM | POA: Diagnosis not present

## 2021-06-20 LAB — HEMOGLOBIN A1C
Hgb A1c MFr Bld: 6.6 % — ABNORMAL HIGH (ref 4.8–5.6)
Mean Plasma Glucose: 142.72 mg/dL

## 2021-06-20 LAB — GLUCOSE, CAPILLARY
Glucose-Capillary: 142 mg/dL — ABNORMAL HIGH (ref 70–99)
Glucose-Capillary: 169 mg/dL — ABNORMAL HIGH (ref 70–99)
Glucose-Capillary: 107 mg/dL — ABNORMAL HIGH (ref 70–99)
Glucose-Capillary: 157 mg/dL — ABNORMAL HIGH (ref 70–99)

## 2021-06-20 MED ORDER — LISINOPRIL 5 MG PO TABS
5.0000 mg | ORAL_TABLET | Freq: Every day | ORAL | Status: DC
  Administered 2021-06-20 – 2021-07-02 (×13): 5 mg via ORAL
  Filled 2021-06-20 (×13): qty 1

## 2021-06-20 MED ORDER — SERTRALINE HCL 50 MG PO TABS
100.0000 mg | ORAL_TABLET | Freq: Every day | ORAL | Status: DC
  Administered 2021-06-20 – 2021-07-02 (×13): 100 mg via ORAL
  Filled 2021-06-20 (×13): qty 2

## 2021-06-20 MED ORDER — ZOLPIDEM TARTRATE 5 MG PO TABS: 5.0000 mg | ORAL_TABLET | Freq: Every evening | ORAL | 0 refills | Status: DC | PRN

## 2021-06-20 MED ORDER — PANTOPRAZOLE SODIUM 40 MG PO TBEC: 40.0000 mg | DELAYED_RELEASE_TABLET | Freq: Every day | ORAL | Status: DC

## 2021-06-20 MED ORDER — ENOXAPARIN SODIUM 60 MG/0.6ML IJ SOSY
60.0000 mg | PREFILLED_SYRINGE | INTRAMUSCULAR | Status: DC
  Administered 2021-06-20 – 2021-07-01 (×11): 60 mg via SUBCUTANEOUS
  Filled 2021-06-20 (×14): qty 0.6

## 2021-06-20 MED ORDER — ENOXAPARIN SODIUM 60 MG/0.6ML IJ SOSY: 60.0000 mg | PREFILLED_SYRINGE | INTRAMUSCULAR | Status: DC

## 2021-06-20 MED ORDER — INSULIN ASPART 100 UNIT/ML IJ SOLN
0.0000 [IU] | Freq: Three times a day (TID) | INTRAMUSCULAR | Status: DC
  Administered 2021-06-20: 3 [IU] via SUBCUTANEOUS
  Administered 2021-06-21 (×2): 2 [IU] via SUBCUTANEOUS
  Administered 2021-06-21 (×2): 3 [IU] via SUBCUTANEOUS
  Administered 2021-06-22 – 2021-06-23 (×6): 2 [IU] via SUBCUTANEOUS
  Administered 2021-06-23: 3 [IU] via SUBCUTANEOUS
  Administered 2021-06-24: 2 [IU] via SUBCUTANEOUS
  Administered 2021-06-24: 3 [IU] via SUBCUTANEOUS
  Administered 2021-06-24: 2 [IU] via SUBCUTANEOUS
  Administered 2021-06-25 (×2): 3 [IU] via SUBCUTANEOUS
  Administered 2021-06-25: 2 [IU] via SUBCUTANEOUS
  Administered 2021-06-26: 3 [IU] via SUBCUTANEOUS
  Administered 2021-06-26: 5 [IU] via SUBCUTANEOUS
  Administered 2021-06-27: 2 [IU] via SUBCUTANEOUS
  Administered 2021-06-27 – 2021-06-28 (×2): 3 [IU] via SUBCUTANEOUS
  Administered 2021-06-28: 2 [IU] via SUBCUTANEOUS
  Administered 2021-06-28: 3 [IU] via SUBCUTANEOUS
  Administered 2021-06-29 (×2): 2 [IU] via SUBCUTANEOUS
  Administered 2021-06-29: 3 [IU] via SUBCUTANEOUS
  Administered 2021-06-30: 2 [IU] via SUBCUTANEOUS
  Administered 2021-06-30: 5 [IU] via SUBCUTANEOUS
  Administered 2021-06-30: 2 [IU] via SUBCUTANEOUS
  Administered 2021-07-02: 08:00:00 3 [IU] via SUBCUTANEOUS

## 2021-06-20 MED ORDER — PANTOPRAZOLE SODIUM 40 MG PO TBEC
40.0000 mg | DELAYED_RELEASE_TABLET | Freq: Every day | ORAL | Status: DC
  Administered 2021-06-20 – 2021-07-01 (×12): 40 mg via ORAL
  Filled 2021-06-20 (×12): qty 1

## 2021-06-20 MED ORDER — OXYCODONE HCL 5 MG PO TABS
5.0000 mg | ORAL_TABLET | Freq: Four times a day (QID) | ORAL | Status: DC | PRN
  Administered 2021-06-20 – 2021-06-29 (×14): 5 mg via ORAL
  Filled 2021-06-20 (×14): qty 1

## 2021-06-20 MED ORDER — OXYCODONE HCL 5 MG PO TABS: 5.0000 mg | ORAL_TABLET | Freq: Four times a day (QID) | ORAL | 0 refills | Status: DC | PRN

## 2021-06-20 MED ORDER — INSULIN ASPART 100 UNIT/ML IJ SOLN: 0.0000 [IU] | Freq: Three times a day (TID) | INTRAMUSCULAR | 11 refills | Status: DC

## 2021-06-20 MED ORDER — SERTRALINE HCL 100 MG PO TABS: 100.0000 mg | ORAL_TABLET | Freq: Every day | ORAL | Status: DC

## 2021-06-20 MED ORDER — ACETAMINOPHEN 325 MG PO TABS
650.0000 mg | ORAL_TABLET | Freq: Four times a day (QID) | ORAL | Status: DC | PRN
  Administered 2021-06-21 – 2021-07-01 (×14): 650 mg via ORAL
  Filled 2021-06-20 (×14): qty 2

## 2021-06-20 MED ORDER — SODIUM CHLORIDE 0.9 % IV SOLN
2.0000 g | Freq: Two times a day (BID) | INTRAVENOUS | Status: AC
  Administered 2021-06-20 – 2021-06-25 (×10): 2 g via INTRAVENOUS
  Filled 2021-06-20 (×12): qty 20

## 2021-06-20 MED ORDER — BUPROPION HCL ER (XL) 300 MG PO TB24
300.0000 mg | ORAL_TABLET | Freq: Every day | ORAL | Status: DC
  Administered 2021-06-20 – 2021-07-02 (×13): 300 mg via ORAL
  Filled 2021-06-20 (×13): qty 1

## 2021-06-20 MED ORDER — NICOTINE 14 MG/24HR TD PT24: 14.0000 mg | MEDICATED_PATCH | Freq: Every day | TRANSDERMAL | 0 refills | Status: DC

## 2021-06-20 MED ORDER — DICLOFENAC SODIUM 1 % EX GEL
4.0000 g | Freq: Four times a day (QID) | CUTANEOUS | Status: DC | PRN
  Filled 2021-06-20: qty 100

## 2021-06-20 MED ORDER — ALPRAZOLAM 0.25 MG PO TABS
0.2500 mg | ORAL_TABLET | Freq: Two times a day (BID) | ORAL | Status: DC | PRN
  Administered 2021-06-22 – 2021-07-01 (×7): 0.25 mg via ORAL
  Filled 2021-06-20 (×8): qty 1

## 2021-06-20 MED ORDER — ALPRAZOLAM 0.25 MG PO TABS: 0.2500 mg | ORAL_TABLET | Freq: Two times a day (BID) | ORAL | 0 refills | Status: DC | PRN

## 2021-06-20 MED ORDER — DICLOFENAC SODIUM 1 % EX GEL: 4.0000 g | Freq: Four times a day (QID) | CUTANEOUS | Status: DC | PRN

## 2021-06-20 MED ORDER — METRONIDAZOLE 500 MG PO TABS
500.0000 mg | ORAL_TABLET | Freq: Three times a day (TID) | ORAL | Status: AC
  Administered 2021-06-20 – 2021-06-26 (×17): 500 mg via ORAL
  Filled 2021-06-20 (×18): qty 1

## 2021-06-20 MED ORDER — METRONIDAZOLE 500 MG PO TABS: 500.0000 mg | ORAL_TABLET | Freq: Three times a day (TID) | ORAL | 0 refills | Status: DC

## 2021-06-20 MED ORDER — ONDANSETRON HCL 4 MG/2ML IJ SOLN: 4.0000 mg | Freq: Three times a day (TID) | INTRAMUSCULAR | 0 refills | Status: DC | PRN

## 2021-06-20 MED ORDER — ASPIRIN EC 81 MG PO TBEC
81.0000 mg | DELAYED_RELEASE_TABLET | Freq: Every day | ORAL | Status: DC
  Administered 2021-06-20 – 2021-07-02 (×13): 81 mg via ORAL
  Filled 2021-06-20 (×13): qty 1

## 2021-06-20 MED ORDER — NICOTINE 14 MG/24HR TD PT24
14.0000 mg | MEDICATED_PATCH | Freq: Every day | TRANSDERMAL | Status: DC
  Administered 2021-06-21 – 2021-07-02 (×12): 14 mg via TRANSDERMAL
  Filled 2021-06-20 (×12): qty 1

## 2021-06-20 MED ORDER — BUPROPION HCL ER (XL) 300 MG PO TB24: 300.0000 mg | ORAL_TABLET | Freq: Every day | ORAL | Status: DC

## 2021-06-20 MED ORDER — SODIUM CHLORIDE 0.9 % IV SOLN: 2.0000 g | Freq: Two times a day (BID) | INTRAVENOUS | Status: DC

## 2021-06-20 MED ORDER — ACETAMINOPHEN 325 MG PO TABS: 650.0000 mg | ORAL_TABLET | Freq: Four times a day (QID) | ORAL | Status: DC | PRN

## 2021-06-20 MED ORDER — ACETAMINOPHEN 650 MG RE SUPP: 650.0000 mg | Freq: Four times a day (QID) | RECTAL | Status: DC | PRN

## 2021-06-20 NOTE — TOC Transition Note (Signed)
Transition of Care [facility]) - CM/SW Discharge Note   Patient Details  Name: Lisa [name] MRN: [id number] Date of Birth: [birth year]  [facility] CM/SW Contact:  Pollie Friar, RN Phone Number: 06/20/2021, 12:54 PM   Clinical Narrative:    Pt is discharging to CIR today. CM signing off.    Final next level of care: IP Rehab Facility Barriers to Discharge: No Barriers Identified   Patient Goals and CMS Choice     Choice offered to / list presented to : Patient  Discharge Placement                       Discharge Plan and Services                                     Social Determinants of Health (SDOH) Interventions     Readmission Risk Interventions No flowsheet data found.

## 2021-06-20 NOTE — Progress Notes (Signed)
Patient presents to unit with significant other. Patient understands orientation to unit. Able to make needs known. Sanda Linger, LPN

## 2021-06-20 NOTE — Progress Notes (Signed)
Lisa Ribas, MD  Physician Physical Medicine and Rehabilitation PMR Pre-admission    Signed Date of Service:  06/19/2021  1:49 PM  Related encounter: ED to Hosp-Admission (Discharged) from 06/11/2021 in [facility]   Signed      Show:Clear all _0 Written_1 Templated_2 Copied  Added by: _3 Cristina Gong, RN_4 Ranell Patrick Clide Deutscher, MD  _5 [place] for details                                                                                                                                                                                                                                                                                                                                                                                                                                              PMR Admission Coordinator Pre-Admission Assessment   Patient: Lisa [name] is an 61 y.o., female MRN: [id number] DOB: Dec 30, 1960 Height: _6  (167.6 cm) Weight: 123.5 kg   Insurance Information HMO: yes    PPO:      PCP:      IPA:      80/20:      OTHER:  PRIMARY: [employer] Generic      Policy#: [id number]      Subscriber: pt CM Name: Luisa Hart      Phone#: [phone] ext [phone]     Fax#: [phone] Pre-Cert#: [id number]   approved for 7 days  Employer:  Benefits:  Phone #: [phone]     Name: 1/11 Eff. Date: 06/09/2021     Deduct: $800      Out of Pocket Max: $3000      Life Max: none CIR: 70%      SNF: 100% 60 days Outpatient: $20 per visit     Co-Pay: per medical neccesity Home Health: NO COVERAGE      Co-Pay:  DME: 100%     Co-Pay: none Providers: in network  SECONDARY: none         Financial Counselor:       Phone#:    [employer] for patients in Inpatient Rehabilitation Facilities with  attached Privacy Act [place] Records was provided and verbally reviewed with: N/A   Emergency Contact Information Contact Information       Name Relation Home Work Mobile    [place] Spouse [phone]   [phone]           Current Medical History  Patient Admitting Diagnosis: Bacterial meningitis   History of Present Illness:  61 year old right-handed female with history of diabetes mellitus hypertension, hyperlipidemia and tobacco use as well as 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    Presented 06/11/2021 with altered mental status and fever.  She was placed on broad-spectrum antibiotics.  Cranial CT scan showed no acute intracranial abnormality.  Complete opacification of both mastoid air cells.  MRI of the brain diffuse leptomeningeal enhancement involving both cerebral hemispheres, highly suggestive of acute meningitis.  Patient did require intubation for airway protection and extubated 06/14/2021.  Lumbar puncture with 6004-25 white blood cells 84% neutrophils and abundant white blood cells but no organisms initially seen, protein 283 glucose 32.  Infectious disease consulted as well as neurology for H influenza bacteremia and meningitis in the setting of CSF leak.  Currently completing a 14-day course of IV ceftriaxone 2 g every 12 hours as well as IV/p.o. Flagyl 500 mg 3 times daily ending 06/25/2021.  Hospital course she did initially require Precedex for agitation.  Recommendations are repeat MRI of the brain before completion of antibiotic course per Dr. Drucilla Schmidt.  Lovenox initiated for DVT prophylaxis.  Currently on mechanical soft diet.  Therapy evaluations completed due to patient decreased functional ability altered mental status was admitted for a comprehensive rehab program.   Patient's medical record from [facility] has  been reviewed by the rehabilitation admission coordinator and physician.   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 No   Family History   family history includes Arthritis in her father and mother; Breast cancer in her maternal grandmother and sister; Diabetes in her paternal grandmother and sister; Heart disease in her father; Hyperlipidemia in her sister; Hypertension in her maternal grandmother and mother; Marfan syndrome in her father and sister;  Prostate cancer in her father.   Current Medications   Current Facility-Administered Medications:    acetaminophen (TYLENOL) tablet 650 mg, 650 mg, Oral, Q6H PRN, 650 mg at 06/16/21 1646 **OR** acetaminophen (TYLENOL) suppository 650 mg, 650 mg, Rectal, Q6H PRN, Skeet Simmer, RPH   ALPRAZolam Duanne Moron) tablet 0.25 mg, 0.25 mg, Oral, BID PRN, Corey Harold, NP, 0.25 mg at 06/19/21 2147   aspirin EC tablet 81 mg, 81 mg, Oral, Daily, Gonfa, Taye T, MD, 81 mg at 06/20/21 1001   buPROPion (WELLBUTRIN XL) 24 hr tablet 300 mg, 300 mg, Oral, Daily, Mannam, Praveen, MD, 300 mg at 06/20/21 1001   cefTRIAXone (ROCEPHIN) 2 g in sodium chloride 0.9 % 100 mL IVPB, 2 g, Intravenous, Q12H, Jose Persia, MD, Last Rate: 200 mL/hr at 06/19/21 2152, 2 g at 06/19/21 2152   diclofenac Sodium (VOLTAREN) 1 % topical gel 4 g, 4 g, Topical, Q6H PRN, Mannam, Praveen, MD, 4 g at 06/18/21 0026   enoxaparin (LOVENOX) injection 60 mg, 60 mg, Subcutaneous, Q24H, Dewald, Jonathan B, MD, 60 mg at 06/19/21 1800   insulin aspart (novoLOG) injection 0-15 Units, 0-15 Units, Subcutaneous, TID AC & HS, Mannam, Praveen, MD, 2  Units at 06/20/21 0733   lisinopril (ZESTRIL) tablet 5 mg, 5 mg, Oral, Daily, Gonfa, Taye T, MD, 5 mg at 06/20/21 1001   metroNIDAZOLE (FLAGYL) tablet 500 mg, 500 mg, Oral, Q8H, Manandhar, Sabina, MD, 500 mg at 06/20/21 5093   nicotine (NICODERM CQ - dosed in mg/24 hours) patch 14 mg, 14 mg, Transdermal, Q0600, Corey Harold, NP, 14 mg at 06/20/21 0611   ondansetron (ZOFRAN) injection 4 mg, 4 mg, Intravenous, Q8H PRN, Cyndia Skeeters, Taye T, MD, 4 mg at 06/20/21 1001   oxyCODONE (Oxy IR/ROXICODONE) immediate release tablet 5 mg, 5 mg, Oral, Q6H PRN, Mannam, Praveen, MD, 5 mg at 06/19/21 2244   pantoprazole (PROTONIX) EC tablet 40 mg, 40 mg, Oral, QHS, Alvira Philips, RPH, 40 mg at 06/19/21 2147   sertraline (ZOLOFT) tablet 100 mg, 100 mg, Oral, Daily, Alvira Philips, RPH, 100 mg at 06/20/21 1006   sodium chloride flush (NS) 0.9 % injection 3 mL, 3 mL, Intravenous, Q12H, Jose Persia, MD, 3 mL at 06/20/21 1006   zolpidem (AMBIEN) tablet 5 mg, 5 mg, Oral, QHS PRN, Corey Harold, NP, 5 mg at 06/19/21 2244   Patients Current Diet:  Diet Order                  DIET DYS 3 Room service appropriate? Yes with Assist; Fluid consistency: Thin  Diet effective now                       Precautions / Restrictions Precautions Precautions: Fall Precaution Comments: wrist restraints, mittens Restrictions Weight Bearing Restrictions: No    Has the patient had 2 or more falls or a fall with injury in the past year? No   Prior Activity Level Limited Community (1-2x/wk): Mod I with RW but very sedenetary over past 2 years per spouse   Prior Functional Level Self Care: Did the patient need help bathing, dressing, using the toilet or eating? Independent   Indoor Mobility: Did the patient need assistance with walking from room to room (with or without device)? Independent   Stairs: Did the patient need assistance with internal or external stairs (with or without device)? Independent    Functional Cognition: Did the patient need help planning regular tasks such as shopping  or remembering to take medications? Independent   Patient Information Are you of Hispanic, Latino/a,or Spanish origin?: A. No, not of Hispanic, Latino/a, or Spanish origin What is your race?: A. White Do you need or want an interpreter to communicate with a doctor or health care staff?: 0. No   Patient's Response To:  Health Literacy and Transportation Is the patient able to respond to health literacy and transportation needs?: Yes Health Literacy - How often do you need to have someone help you when you read instructions, pamphlets, or other written material from your doctor or pharmacy?: Never In the past 12 months, has lack of transportation kept you from medical appointments or from getting medications?: No In the past 12 months, has lack of transportation kept you from meetings, work, or from getting things needed for daily living?: No   Home Assistive Devices / Equipment Home Equipment: Grab bars - tub/shower, Hand held shower head, Shower seat, Rollator (4 wheels), Cane - single point   Prior Device Use: Indicate devices/aids used by the patient prior to current illness, exacerbation or injury?  rollator   Current Functional Level Cognition   Overall Cognitive Status: No family/caregiver present to determine baseline cognitive functioning Difficult to assess due to: Level of arousal Orientation Level: Oriented X4 General Comments: Pt able to indicate needing BM today (limited warning) and appreciative of therapist assistance to get OOB to chair. Pt c/o "feeling depressed" in hospital. Good following of 1-step commands.    Extremity Assessment (includes Sensation/Coordination)   Upper Extremity Assessment: Generalized weakness, RUE deficits/detail RUE Deficits / Details: h/o R shoulder pain for about 1 month. Pt awaiting MRI for R shoulder today. able to raise shoulder enough to hold onto rw  on right side while seated RUE: Shoulder pain at rest  Lower Extremity Assessment: Defer to PT evaluation     ADLs   Overall ADL's : Needs assistance/impaired Eating/Feeding: Set up, Sitting Eating/Feeding Details (indicate cue type and reason): Left eating late lunch Grooming: Wash/dry face, Wash/dry hands, Set up, Sitting Grooming Details (indicate cue type and reason): completed EOB Upper Body Bathing: Total assistance, Bed level Lower Body Bathing: Total assistance, Bed level Lower Body Bathing Details (indicate cue type and reason): Completed s/p BM on bed pan Lower Body Dressing: Maximal assistance, Sitting/lateral leans Lower Body Dressing Details (indicate cue type and reason): attempted to don socks this session, able to reach top of foot, unable to get over toes before trunk fatiguing. Toileting- Clothing Manipulation and Hygiene: Total assistance, Bed level General ADL Comments: Pt requiring increased assist for LB ADL's and Functional mobility.     Mobility   Overal bed mobility: Needs Assistance Bed Mobility: Supine to Sit, Sit to Supine Rolling: Mod assist, Min assist, +2 for physical assistance Supine to sit: Mod assist, +2 for physical assistance (HOB <30 deg) Sit to supine: Mod assist, +2 for physical assistance General bed mobility comments: cues for log roll sequencing, pt needing increased assist to roll to Lt side and minA only to Rt side, rolling x4 initially for placement of bed pan and clean-up.     Transfers   Overall transfer level: Needs assistance Equipment used: Rolling walker (2 wheels) Transfers: Sit to/from Stand, Bed to chair/wheelchair/BSC Sit to Stand: +2 physical assistance, Mod assist, From elevated surface Bed to/from chair/wheelchair/BSC transfer type:: Via Lift equipment Transfer via Lift Equipment: Stedy General transfer comment: from elevated bed to [place] with use of momentum and +2 modA to achieve upright, pt able to  stand one minute prior  to sitting on Stedy flaps; pt with poor eccentric control for stand>sit to chair from [place]. TotalA to chair in [place], pt unable to lift RLE for pivotal steps today     Ambulation / Gait / Stairs / Wheelchair Mobility   Ambulation/Gait Pre-gait activities: pt able to raise Rt heel in stance but unable to lift foot off floor or to raise Rt heel for weight shifting.     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    Previous Home Environment  Living Arrangements: Spouse/significant other (adult daughter lives with them)  Lives With: Spouse, Daughter Available Help at Discharge:  (spouse would arrnage 24/7 assist) Type of Home: House Home Layout: Two level, Able to live on main level with bedroom/bathroom Home Access: Stairs to enter [employer] of Steps: 1 Bathroom Shower/Tub: Chiropodist: Standard Bathroom Accessibility: Yes How Accessible: Accessible via walker [place]: No   Discharge Living Setting Plans for Discharge Living Setting: Patient's home, Lives with (comment) (spouse and adult daughter) Type of Home at Discharge: House Discharge Home Layout: Two level, Able to live on main level with bedroom/bathroom Discharge Home Access: Stairs to enter Entrance Stairs-Rails: None Entrance Stairs-Number of Steps: 1 Discharge Bathroom Shower/Tub: Tub/shower unit Discharge Bathroom Toilet: Standard Discharge Bathroom Accessibility: Yes How Accessible: Accessible via walker Does the patient have any problems obtaining your medications?: Yes (Describe)    Social/Family/Support Systems Patient Roles: Spouse, Parent Contact Information: spouse, Shaun Anticipated Caregiver: spouse Shaun and adult daughter Anticipated Caregiver's Contact Information: see contacts Ability/Limitations of Caregiver: 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[place], commute from [place] Availability: 24/7 (spouse will arrange 24/7) Discharge Plan Discussed with Primary Caregiver: Yes Is Caregiver In Agreement with Plan?: Yes Does Caregiver/Family have Issues with Lodging/Transportation while Pt is in Rehab?: No   Goals Patient/Family Goal for Rehab: supervision to min assist with PT and OT Expected length of stay: ELOS 10 to 14 days Pt/Family Agrees to Admission and willing to participate: Yes Program Orientation Provided & Reviewed with Pt/Caregiver Including Roles  & Responsibilities: Yes   Decrease burden of Care through IP rehab admission: n/a   Possible need for SNF placement upon discharge: not anticipated   Patient Condition: I have reviewed medical records from [facility] , spoken with CM, and patient and spouse. I met with patient at the bedside for inpatient rehabilitation assessment.  Patient will benefit from ongoing PT and OT, can actively participate in 3 hours of therapy a day 5 days of the week, and can make measurable gains during the admission.  Patient will also benefit from the coordinated team approach during an Inpatient Acute Rehabilitation admission.  The patient will receive intensive therapy as well as Rehabilitation physician, nursing, social worker, and care management interventions.  Due to bladder management, bowel management, safety, skin/wound care, disease management, medication administration, pain management, and patient education the patient requires 24 hour a day rehabilitation nursing.  The patient is currently max assist overall  with mobility and basic ADLs.  Discharge setting and therapy post discharge at home with home  health is anticipated.  Patient has agreed to participate in the  Acute Inpatient Rehabilitation Program and will admit today.   Preadmission Screen Completed By:  Cleatrice Burke, 06/20/2021 11:11 AM ______________________________________________________________________   Discussed status with Dr. Ranell Patrick on 06/20/2021 at 27 and received approval for admission today.   Admission Coordinator:  Cleatrice Burke, RN, time  7116 Date 06/20/2021    Assessment/Plan: Diagnosis: Bacterial meningitis Does the need for close, 24 hr/day Medical supervision in concert with the patient's rehab needs make it unreasonable for this patient to be served in a less intensive setting? Yes Co-Morbidities requiring supervision/potential complications: morbid obesity, right knee osteoarthritis/pain, type 2 DM, CSK leak, metabolic encephalopathy Due to bladder management, bowel management, safety, skin/wound care, disease management, medication administration, pain management, and patient education, does the patient require 24 hr/day rehab nursing? Yes Does the patient require coordinated care of a physician, rehab nurse, PT, OT, and SLP to address physical and functional deficits in the context of the above medical diagnosis(es)? Yes Addressing deficits in the following areas: balance, endurance, locomotion, strength, transferring, bowel/bladder control, bathing, dressing, feeding, grooming, toileting, and psychosocial support Can the patient actively participate in an intensive therapy program of at least 3 hrs of therapy 5 days a week? Yes The potential for patient to make measurable gains while on inpatient rehab is excellent Anticipated functional outcomes upon discharge from inpatient rehab: min assist PT, min assist OT, supervision and min assist SLP Estimated rehab length of stay to reach the above functional goals is: 20 days Anticipated discharge destination: Home 10. Overall Rehab/Functional  Prognosis: fair     MD Signature: Leeroy Cha, MD         Revision History                               Note Details  Author Lisa Ribas, MD File Time 06/20/2021 11:15 AM  Author Type Physician Status Signed  Last Editor Lisa Ribas, MD Service Physical Medicine and [place] # [id number] Admit Date 06/20/2021

## 2021-06-20 NOTE — Progress Notes (Signed)
Inpatient Rehabilitation Admission Medication Review by a Pharmacist  A complete drug regimen review was completed for this patient to identify any potential clinically significant medication issues.  High Risk Drug Classes Is patient taking? Indication by Medication  Antipsychotic No   Anticoagulant Yes Lovenox for VTE prophx.  Antibiotic Yes Rocephin, Flagyl for meningitis  Opioid Yes Oxycodone for pain and HA's  Antiplatelet Yes ASA for CV risk reduction  Hypoglycemics/insulin Yes SSI for DM  Vasoactive Medication Yes Lisinopril for HTN  Chemotherapy No   Other No      Type of Medication Issue Identified Description of Issue Recommendation(s)  Drug Interaction(s) (clinically significant)     Duplicate Therapy     Allergy     No Medication Administration End Date     Incorrect Dose     Additional Drug Therapy Needed  Was the inpatient discharge discontinuation of all DM meds intentional?  Diclofenac , Spironolactone Resume Metformin and Lantus as DM dictates.  - Resume as pain or BP dictates.  Significant med changes from prior encounter (inform family/care partners about these prior to discharge).    Other       Clinically significant medication issues were identified that warrant physician communication and completion of prescribed/recommended actions by midnight of the next day:  No  Time spent performing this drug regimen review (minutes):  31min   Romney Compean S. Alford Highland, PharmD, BCPS Clinical Staff Pharmacist [email or web address] [place] 06/20/2021 3:44 PM

## 2021-06-20 NOTE — Progress Notes (Signed)
Inpatient Rehabilitation Admissions Coordinator   I have insurance approval and CIR bed to admit her to today. I met with her at bedside and spoke with her spouse by phone. They are in agreement. I have alerted acute team and TOC to make arrangements to admit today.  Danne Baxter, RN, MSN Rehab Admissions Coordinator [phone] 06/20/2021 10:29 AM

## 2021-06-20 NOTE — Progress Notes (Signed)
Inpatient Rehabilitation  Patient information reviewed and entered into eRehab system by Nil Bolser M. Maira Christon, M.A., CCC/SLP, PPS Coordinator.  Information including medical coding, functional ability and quality indicators will be reviewed and updated through discharge.    

## 2021-06-20 NOTE — Discharge Instructions (Addendum)
It was a pleasure taking care of you!  You presented to the ED with altered mental status and were found to have infection in the brain called bacterial meningitis. We started you on antibiotics which end on 06/25/21. You were in the ICU from 06/12/21-06/19/21. You are being discharged to inpatient rehab here at [facility]. Our goal is to continue the antibiotic and help you regain your strength to get you back to feeling great. After discharge from the inpatient rehab, we would like for you to follow up with your primary care doctor and infectious disease outpatient. Please make sure you schedule these appointments.

## 2021-06-20 NOTE — Plan of Care (Signed)

## 2021-06-20 NOTE — Discharge Summary (Signed)
Name: Lisa [name] MRN: [id number] DOB: Jun 30, 1960 61 y.o. PCP: Lisa Headland, MD  Date of Admission: 06/11/2021  1:49 PM Date of Discharge: 06/20/21 Attending Physician: Axel Filler, *  Discharge Diagnosis: Principal Problem:   Acute bacterial meningitis Active Problems:   Diabetes mellitus, type 2 ([place])   CSF leak   Acute metabolic encephalopathy   Altered mental status   Pressure injury of skin   Sepsis due to Haemophilus influenzae with acute hypoxic respiratory failure and septic shock (HCC)   Fever   Bacterial meningitis   Right sided weakness  Hypertension  Hyperlipidemia  Depression   Hypokalemia Discharge Medications: Allergies as of 06/20/2021       Reactions   Statins Other (See Comments)   Other reaction(s): Other CSF leaking out nose. Headache   Glimepiride Other (See Comments)   Hypoglicemia        Medication List     STOP taking these medications    clonazePAM 0.5 MG tablet Commonly known as: KLONOPIN   famotidine 20 MG tablet Commonly known as: PEPCID   fenofibrate 48 MG tablet Commonly known as: TRICOR   fluticasone 50 MCG/ACT nasal spray Commonly known as: FLONASE   Lantus SoloStar 100 UNIT/ML Solostar Pen Generic drug: insulin glargine   lidocaine 5 % Commonly known as: Lidoderm   metFORMIN 500 MG 24 hr tablet Commonly known as: GLUCOPHAGE-XR   UltiCare Short Pen Needles 31G X 8 MM Misc Generic drug: Insulin Pen Needle   Victoza 18 MG/3ML Sopn Generic drug: liraglutide   vitamin B-12 1000 MCG tablet Commonly known as: CYANOCOBALAMIN       TAKE these medications    acetaminophen 325 MG tablet Commonly known as: TYLENOL Take 2 tablets (650 mg total) by mouth every 6 (six) hours as needed for fever (temp > 101).   albuterol 108 (90 Base) MCG/ACT inhaler Commonly known as: VENTOLIN HFA Inhale 2 puffs into the lungs every 6 (six) hours as needed for wheezing.   ALPRAZolam 0.25 MG tablet Commonly  known as: XANAX Take 1 tablet (0.25 mg total) by mouth 2 (two) times daily as needed for anxiety.   aspirin EC 81 MG tablet Take 81 mg by mouth daily. Swallow whole.   buPROPion 300 MG 24 hr tablet Commonly known as: WELLBUTRIN XL Take 1 tablet (300 mg total) by mouth daily. Start taking on: June 21, 2021   cefTRIAXone 2 g in sodium chloride 0.9 % 100 mL Inject 2 g into the vein every 12 (twelve) hours for 5 days.   cyclobenzaprine 10 MG tablet Commonly known as: FLEXERIL Take 1 tablet (10 mg) by mouth daily at bedtime as needed What changed:  how much to take how to take this when to take this reasons to take this   diclofenac 75 MG EC tablet Commonly known as: VOLTAREN Take 1 tablet (75 mg total) by mouth 2 (two) times daily.   diclofenac Sodium 1 % Gel Commonly known as: VOLTAREN Apply 4 g topically every 6 (six) hours as needed (Shoulder pain).   enoxaparin 60 MG/0.6ML injection Commonly known as: LOVENOX Inject 0.6 mLs (60 mg total) into the skin daily.   Eszopiclone 3 MG Tabs TAKE 1 TABLET BY MOUTH ONCE DAILY IMMEDIATELY BEFORE BEDTIME   insulin aspart 100 UNIT/ML injection Commonly known as: novoLOG Inject 0-15 Units into the skin 4 (four) times daily -  before meals and at bedtime.   lisinopril 5 MG tablet Commonly known as: ZESTRIL Take 5  mg by mouth daily.   metroNIDAZOLE 500 MG tablet Commonly known as: FLAGYL Take 1 tablet (500 mg total) by mouth every 8 (eight) hours for 5 days.   nicotine 14 mg/24hr patch Commonly known as: NICODERM CQ - dosed in mg/24 hours Place 1 patch (14 mg total) onto the skin daily at 6 (six) AM. Start taking on: June 21, 2021   ondansetron 4 MG/2ML Soln injection Commonly known as: ZOFRAN Inject 2 mLs (4 mg total) into the vein every 8 (eight) hours as needed for nausea or vomiting.   oxyCODONE 5 MG immediate release tablet Commonly known as: Oxy IR/ROXICODONE Take 1 tablet (5 mg total) by mouth every 6 (six)  hours as needed for severe pain.   pantoprazole 40 MG tablet Commonly known as: PROTONIX Take 1 tablet (40 mg total) by mouth at bedtime.   sertraline 100 MG tablet Commonly known as: ZOLOFT Take 1 tablet (100 mg total) by mouth daily. Start taking on: June 21, 2021 What changed:  how much to take how to take this when to take this   spironolactone 25 MG tablet Commonly known as: ALDACTONE TAKE 1 TABLET BY MOUTH ONCE DAILY   tiZANidine 4 MG tablet Commonly known as: ZANAFLEX Take 1 tablet by mouth every evening for muscle spasms and 1/2 tab as needed during daytime What changed:  how much to take how to take this when to take this reasons to take this   zolpidem 10 MG tablet Commonly known as: AMBIEN TAKE ONE TABLET (10 MG DOSE) BY MOUTH AT BEDTIME AS NEEDED FOR SLEEP. What changed:  Another medication with the same name was changed. Make sure you understand how and when to take each. Another medication with the same name was removed. Continue taking this medication, and follow the directions you see here.   zolpidem 5 MG tablet Commonly known as: AMBIEN Take 1 tablet (5 mg total) by mouth at bedtime as needed for sleep. What changed:  medication strength how much to take reasons to take this additional instructions Another medication with the same name was removed. Continue taking this medication, and follow the directions you see here.        Disposition and follow-up:   Lisa [name] was discharged from [facility] in Stable condition.  At the hospital follow up visit please address:  1. Acute Bacterial Meningitis: Patient had bacterial meningitis and was treated with IV antibiotics. She showed significant improvement. Continue this treatment while in inpatient rehab until 06/25/21. ID recommends MRI on 06/24/21. Since the bacterial infection was 2/2 to patient's hx of CSF leak, she may benefit from a neurosurgery consult to fix the  underlying cause.   2. Diabetes: Patient has DMII and her A1c on 06/20/21 was 6.6. While inpatient she was kept on SSI. Continue SSI and and long acting if needed. Her home regimen included 20 units of glargine, Metformin 500 mg and Victoza.   3. HLD: Patient had hx of HLD but no recent lipid panel. Please repeat a lipid panel and start medication if appropriate.    2.  Labs / imaging needed at time of follow-up: CBC, BMP  3.  Pending labs/ test needing follow-up: None  Follow-up Appointments: PCP follow up after discharge from [facility]. ID follow up after discharge from CIR.   Hospital Course by problem list: HPI per Dr. Posey Pronto: "TAHIRI SHAREEF is a 62 y.o. 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 No hx of meningitis.    All of the history was provided by her spouse, Raquel Sarna. According to her husband, the patient had a sudden onset headache, fever, and CSF leak from nose the morning prior to arrival to the ED. Pt attributed headache to sudden cessation of caffeine and noticed possible improvement with caffeine intake. Husband reports that she has not complained of chills, neck stiffness, vision changes, N/V, CP, SHOB, abdominal pain, urinary sxs, diarrhea, constipation or myalgia. No skin changes. He reports that she had 1 day of chills, sore throat, and body aches a week ago that resolved on its own.   No recent changes besides starting a diet this month and recent reduction in diabetes medication."  Acute Bacterial Meningitis Patient was found to have bacterial meningitis. LP was done using fluoroscopy with  partial sedation. After the LP patient was taken to the ICU  on 06/12/21-06/19/21 for ventilator management. She was started on empiric treatment with IV ampicillin, ceftriaxone, and vancomycin. No growth on CSF cultures. Blood cultures grew haemophilus influenza in 1/4 bottles and high dose ceftriaxone was continued while ampicillin and vancomycin were discontinued. MRI brain showed findings consistent with acute meningitis as well. No indication for steroids. She was extubated on 06/14/21. Neuro was consulted for right sided weakness that was new to the patient. MRI was ordered but patient unable to tolerate due to anxiety. There was concern for 2/2 to septic emboli vs abscess development. She was started on aspirin 81 mg daily, and palvix 75 mg daily. The plavix was discontinued when the patient endorsed the weakness was chronic for her. Asprin was continued. EEG was performed but did not show any seizure like activity. PO metronidazole 500 mg TID was added with end date for both abx to be 06/25/21.  T2DM A1c was 6.6. SSI was used while inpatient. Home regimen includes Metfromin 500 mg XR, glargine 20 units daily, and victoza.   HTN Patient has hx of htn. Home med is lisinopril 5 mg which was restarted on 06/19/20. BP near goal but increase if indicated.   HLD Patient has hx of HLD. No recent lipid panel. Please repeat lipid panel and start medication if indicated.   Hypokalemia Potassium was repleted as needed while inpatient. K at discharge was 3.8.   Depression Chronic. Held initially but restarted home Zoloft and Wellbutrin and Ambien at night for sleep.   Discharge Subjective: Patient seen and evaluated on the day of discharge. She says her neck feels sore from laying in bed. Denied any acute concerns.  Discharge Exam:   BP 122/67 (BP Location: Left Arm)    Pulse 75    Temp 98.9 F (37.2 C) (Oral)    Resp 18    Ht 5\' 6"  (1.676 m)    Wt 123.5 kg    LMP 11/02/2013    SpO2 91%    BMI 43.95  kg/m  Physical Exam:  Physical Exam General: NAD Head: Normocephalic without scalp lesions.  Mouth and Throat: Lips normal color, without lesions. Neck: Neck supple with full range of motion (ROM).  Lungs: CTAB, no wheeze, rhonchi  or rales.  Cardiovascular: Normal heart sounds, no r/m/g, 2+ radial pulses. No LE edema Abdomen: No TTP, normal bowel sounds MSK: No asymmetry or muscle atrophy. Neuro: Alert and oriented. CN grossly intact Psych: Normal mood and normal affect    Pertinent Labs, Studies, and Procedures:  CBC Latest Ref Rng & Units 06/19/2021 06/18/2021 06/17/2021  WBC 4.0 - 10.5 K/uL - 9.5 10.5  Hemoglobin 12.0 - 15.0 g/dL 13.0 11.6(L) 11.1(L)  Hematocrit 36.0 - 46.0 % 38.4 34.2(L) 34.3(L)  Platelets 150 - 400 K/uL - 353 347    CMP Latest Ref Rng & Units 06/19/2021 06/18/2021 06/17/2021  Glucose 70 - 99 mg/dL 116(H) 119(H) 106(H)  BUN 8 - 23 mg/dL <5(L) 5(L) 12  Creatinine 0.44 - 1.00 mg/dL 0.64 0.56 0.75  Sodium 135 - 145 mmol/L 136 138 139  Potassium 3.5 - 5.1 mmol/L 3.8 3.5 3.3(L)  Chloride 98 - 111 mmol/L 104 107 105  CO2 22 - 32 mmol/L 22 24 23   Calcium 8.9 - 10.3 mg/dL 8.4(L) 7.8(L) 8.1(L)  Total Protein 6.5 - 8.1 g/dL - - -  Total Bilirubin 0.3 - 1.2 mg/dL - - -  Alkaline Phos 38 - 126 U/L - - -  AST 15 - 41 U/L - - -  ALT 0 - 44 U/L - - -    A1c: 6.6  DG Chest 1 View  Result Date: 06/11/2021 CLINICAL DATA:  Fever. EXAM: CHEST  1 VIEW COMPARISON:  None. FINDINGS: Mild cardiomegaly. Left lung is clear. Mild right basilar atelectasis or infiltrate is noted. The visualized skeletal structures are unremarkable. IMPRESSION: Mild right basilar atelectasis or infiltrate is noted. Electronically Signed   By: Marijo Conception M.D.   On: 06/11/2021 14:48   CT Head Wo Contrast  Result Date: 06/11/2021 CLINICAL DATA:  Mental status change. EXAM: CT HEAD WITHOUT CONTRAST TECHNIQUE: IMPRESSION: 1. No acute intracranial abnormality. 2. Complete opacification of both mastoid air  cells. Mucosal thickening of both sphenoid sinuses and scattered throughout ethmoid air cells. Findings are of unknown acuity, may represent mastoiditis or sinusitis in the appropriate clinical setting. Electronically Signed   By: Keith Rake M.D.   On: 06/11/2021 19:28   IR Fluoro Guide Ndl Plmt / BX  Result Date: 06/12/2021 CLINICAL DATA:  Patient admitted with altered mental status, fevers, history of CSF leak. Interventional radiology asked to perform a diagnostic lumbar puncture. EXAM: DIAGNOSTIC LUMBAR PUNCTURE UNDER FLUOROSCOPIC GUIDANCE COMPARISON:  IMPRESSION: Technically successful L3-L4 lumbar puncture yielding 12 mL cloudy CSF for laboratory studies. Read by: Soyla Dryer, NP Electronically Signed   By: Corrie Mckusick D.O.   On: 06/12/2021 16:38   Portable Chest x-ray  Result Date: 06/12/2021 CLINICAL DATA:  Endotracheal tube present Z97.8 (ICD-10-CM) EXAM: PORTABLE CHEST 1 VIEW COMPARISON:   IMPRESSION: 1. Endotracheal tube tip at the level of the clavicular heads. 2. Increased left basilar and similar right midlung opacities, which could represent atelectasis or pneumonia. Electronically Signed   By: Margaretha Sheffield M.D.   On: 06/12/2021 17:18    VAS [place] ABI WITH/WO TBI  Result Date: 06/12/2021  LOWER EXTREMITY DOPPLER STUDY Patient Name:  MEGA KINKADE  Date of Exam:   06/12/2021 Medical Rec #: [id number]         Accession #:    [id number] Date of Birth: Jul 16, 1960         Patient Gender: F Patient Age:   69 years Exam Location:  [facility] Procedure:  VAS [place] ABI WITH/WO TBI Referring Phys: CHI ELLISON --------------------------------------------------------------------------------  Indications: Cool feet. High Risk Factors: Hypertension, hyperlipidemia, Diabetes.  Comparison Study: No prior studies. Performing Technologist: Carlos Levering RVT  Summary: Right: Resting right ankle-brachial index indicates mild right lower extremity arterial disease. The right toe-brachial  index is abnormal. Left: Resting left ankle-brachial index is within normal range. No evidence of significant left lower extremity arterial disease. Unable to obtain TBI due to low amplitude waveforms.  *See table(s) above for measurements and observations.  Electronically signed by Harold Barban MD on 06/12/2021 at 11:31:26 PM.    Final      Discharge Instructions: Discharge Instructions     Call MD for:  difficulty breathing, headache or visual disturbances   Complete by: As directed    Call MD for:  extreme fatigue   Complete by: As directed    Call MD for:  hives   Complete by: As directed    Call MD for:  persistant dizziness or light-headedness   Complete by: As directed    Call MD for:  persistant nausea and vomiting   Complete by: As directed    Call MD for:  redness, tenderness, or signs of infection (pain, swelling, redness, odor or green/yellow discharge around incision site)   Complete by: As directed    Call MD for:  severe uncontrolled pain   Complete by: As directed    Call MD for:  temperature >100.4   Complete by: As directed    Diet - low sodium heart healthy   Complete by: As directed    Increase activity slowly   Complete by: As directed    No wound care   Complete by: As directed        Signed: Idamae Schuller, MD Tillie Rung. [facility] Internal Medicine Residency, PGY-1  06/20/2021, 1:37 PM   Pager: [phone]

## 2021-06-20 NOTE — H&P (Addendum)
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  Per chart review patient lives with spouse.  1 level home one-step to entry.  Independent prior to admission with occasional cane.  Presented 06/11/2021 with altered mental status and fever.  She was placed on broad-spectrum antibiotics.  Cranial CT scan showed no acute intracranial abnormality.  Complete opacification of both mastoid air cells.  MRI of the brain diffuse leptomeningeal enhancement involving both cerebral hemispheres, highly suggestive of acute meningitis.  Patient did require intubation for airway protection and extubated 06/14/2021.  Lumbar puncture with 6004-25 white blood cells 84% neutrophils and abundant white blood cells but no organisms initially seen, protein 283 glucose 32.  Infectious disease consulted as well as neurology for H influenza bacteremia and meningitis in the setting of CSF leak.  Currently completing a 14-day course of IV ceftriaxone 2 g every 12 hours as well as IV/p.o. Flagyl 500 mg 3 times daily ending 06/25/2021.  Hospital course she did initially require Precedex for agitation.  Recommendations are repeat MRI of the brain before completion of antibiotic course per Dr. Drucilla Schmidt.  Lovenox initiated for DVT prophylaxis.  Currently on mechanical soft diet.  Therapy evaluations completed due to patient decreased functional ability altered mental status was admitted for a comprehensive rehab program. She complaints of right knee pain.   Review of Systems  Constitutional:  Positive for fever.  HENT:  Negative for hearing loss.    Eyes:  Negative for blurred vision and double vision.  Respiratory:  Negative for cough and shortness of breath.   Cardiovascular:  Negative for chest pain, palpitations and leg swelling.  Gastrointestinal:  Positive for constipation. Negative for heartburn, nausea and vomiting.  Genitourinary:  Negative for dysuria, flank pain and hematuria.  Musculoskeletal:  Positive for joint pain and myalgias.  Skin:  Negative for rash.  Neurological:  Positive for headaches.  Psychiatric/Behavioral:  Positive for depression. The patient has insomnia.        Anxiety  All other systems reviewed and are negative. 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    Polycystic ovarian disease    Past Surgical History:  Procedure Laterality Date   csf leak in skull  2005   noted failed attempt to close CSF leak in skull   DILATION AND EVACUATION  1990   miscarriage   IR FLUORO GUIDED NEEDLE PLC ASPIRATION/INJECTION LOC  06/12/2021   RADIOLOGY WITH ANESTHESIA N/A 06/12/2021   Procedure: IR WITH ANESTHESIA;  Surgeon: Radiologist, Medication, MD;  Location: [place];  Service: Radiology;  Laterality: N/A;   TONSILLECTOMY AND ADENOIDECTOMY  1973   Family History  Problem Relation Age of Onset   Arthritis Mother    Hypertension Mother    Arthritis Father    Prostate cancer Father    Heart disease  Father    Marfan syndrome Father    Hyperlipidemia Sister    Diabetes Sister    Marfan syndrome Sister    Breast cancer Sister    Breast cancer Maternal Grandmother    Hypertension Maternal Grandmother    Diabetes Paternal Grandmother    Social History:  reports that she has been smoking. She has a 52.50 pack-year smoking history. She has never used smokeless tobacco. She reports that she does not drink  alcohol and does not use drugs. Allergies:  Allergies  Allergen Reactions   Statins Other (See Comments)    Other reaction(s): Other CSF leaking out nose. Headache   Glimepiride Other (See Comments)    Hypoglicemia   Medications Prior to Admission  Medication Sig Dispense Refill   albuterol (PROVENTIL HFA;VENTOLIN HFA) 108 (90 BASE) MCG/ACT inhaler Inhale 2 puffs into the lungs every 6 (six) hours as needed for wheezing. 1 Inhaler 0   aspirin EC 81 MG tablet Take 81 mg by mouth daily. Swallow whole.     clonazePAM (KLONOPIN) 0.5 MG tablet Take 1/2 tablet by mouth as needed for anxiety. Max of 1 tablet daily. (Patient taking differently: Take 0.25-0.5 mg by mouth daily as needed for anxiety.) 15 tablet 0   cyclobenzaprine (FLEXERIL) 10 MG tablet Take 1 tablet (10 mg) by mouth daily at bedtime as needed (Patient taking differently: Take 10 mg by mouth daily as needed for muscle spasms.) 30 tablet 0   diclofenac (VOLTAREN) 75 MG EC tablet Take 1 tablet (75 mg total) by mouth 2 (two) times daily. 180 tablet 0   insulin glargine (LANTUS SOLOSTAR) 100 UNIT/ML Solostar Pen INJECT 20 UNITS INTO THE SKIN AT BEDTIME. (Patient taking differently: Inject 20 Units into the skin every evening.) 15 mL 1   liraglutide (VICTOZA) 18 MG/3ML SOPN Inject 0.2 mLs (1.2 mg dose) into the skin daily. (Patient taking differently: Inject 1.2 mg into the skin daily.) 18 mL 1   lisinopril (ZESTRIL) 5 MG tablet Take 5 mg by mouth daily.     metFORMIN (GLUCOPHAGE-XR) 500 MG 24 hr tablet Take 500 mg with breakfast and 1000 mg with dinner. (Patient taking differently: Take 500-1,000 mg by mouth See admin instructions. Takes 500 mg in the morning and 1000 mg at night) 270 tablet 1   spironolactone (ALDACTONE) 25 MG tablet TAKE 1 TABLET BY MOUTH ONCE DAILY (Patient taking differently: Take 25 mg by mouth daily.) 90 tablet 2   tiZANidine (ZANAFLEX) 4 MG tablet Take 1 tablet by mouth every evening for muscle spasms and 1/2 tab as  needed during daytime (Patient taking differently: Take 2-4 mg by mouth daily as needed for muscle spasms.) 45 tablet 2   WELLBUTRIN XL 300 MG 24 hr tablet Take 1 tablet (300 mg) by mouth daily in the morning (Patient taking differently: Take 300 mg by mouth daily.) 30 tablet 2   clonazePAM (KLONOPIN) 0.5 MG tablet TAKE ONE HALF TABLET (0.25 MG DOSE) BY MOUTH DAILY AS NEEDED FOR ANXIETY. (Patient not taking: Reported on 06/11/2021) 15 tablet 0   Eszopiclone 3 MG TABS TAKE 1 TABLET BY MOUTH ONCE DAILY IMMEDIATELY BEFORE BEDTIME 30 tablet 0   famotidine (PEPCID) 20 MG tablet TAKE ONE TABLET (20 MG DOSE) BY MOUTH 2 (TWO) TIMES DAILY. (Patient not taking: Reported on 06/11/2021) 180 tablet 1   fenofibrate (TRICOR) 48 MG tablet Take one tablet (48 mg dose) by mouth daily. (Patient not taking: Reported on 06/11/2021) 90 tablet 0   fluticasone (FLONASE) 50  MCG/ACT nasal spray PLACE TWO SPRAYS BY NASAL ROUTE DAILY. (Patient not taking: Reported on 06/11/2021) 16 g 1   insulin glargine (LANTUS) 100 UNIT/ML Solostar Pen INJECT TEN UNITS INTO THE SKIN AT BEDTIME. (Patient not taking: Reported on 06/11/2021) 15 mL 1   Insulin Pen Needle 31G X 8 MM MISC Use to inject Victoza daily as directed 100 each 2   lidocaine (LIDODERM) 5 % Place 1 patch onto the skin daily. (MAY WEAR UP TO 12HOURS.) (Patient not taking: Reported on 06/11/2021) 30 patch 0   liraglutide (VICTOZA) 18 MG/3ML SOPN INJECT 0.2 MLS (1.2 MG DOSE) INTO THE SKIN DAILY. (Patient not taking: Reported on 06/11/2021) 18 mL 1   metFORMIN (GLUCOPHAGE-XR) 500 MG 24 hr tablet TAKE 1 TABLET BY MOUTH WITH BREAKFAST AND 2 TABLETS WITH DINNER (Patient not taking: Reported on 06/11/2021) 270 tablet 1   sertraline (ZOLOFT) 100 MG tablet TAKE TWO TABLETS (200 MG DOSE) BY MOUTH DAILY. (Patient not taking: Reported on 06/11/2021) 180 tablet 0   ULTICARE SHORT PEN NEEDLES 31G X 8 MM MISC Please use to inject Victoza once daily. ICD: E11.69, E78.2 100 each 2   vitamin B-12  (CYANOCOBALAMIN) 1000 MCG tablet Take one tablet (1,000 mcg dose) by mouth daily. (Patient not taking: Reported on 06/11/2021) 100 tablet 3   zolpidem (AMBIEN) 10 MG tablet TAKE ONE TABLET (10 MG DOSE) BY MOUTH AT BEDTIME AS NEEDED FOR SLEEP. (Patient not taking: Reported on 06/11/2021) 30 tablet 0   zolpidem (AMBIEN) 10 MG tablet TAKE 1 TABLET BY MOUTH AT BEDTIME AS NEEDED FOR SLEEP (Patient not taking: Reported on 06/11/2021) 30 tablet 0   zolpidem (AMBIEN) 10 MG tablet TAKE 1 TABLET BY MOUTH EVERY NIGHT AT BEDTIME AS NEEDED FOR SLEEP (Patient not taking: Reported on 06/11/2021) 30 tablet 0   zolpidem (AMBIEN) 10 MG tablet TAKE ONE TABLET (10 MG DOSE) BY MOUTH AT BEDTIME AS NEEDED FOR SLEEP. (Patient not taking: Reported on 06/11/2021) 30 tablet 0   zolpidem (AMBIEN) 5 MG tablet TAKE ONE TABLET (5 MG DOSE) BY MOUTH AT BEDTIME AS NEEDED FOR SLEEP. (Patient not taking: Reported on 06/11/2021) 30 tablet 0    Drug Regimen Review Drug regimen was reviewed and remains appropriate with no significant issues identified  Home: Home Living Family/patient expects to be discharged to:: Private residence Living Arrangements: Spouse/significant other (adult daughter lives with them) Available Help at Discharge:  (spouse would arrnage 24/7 assist) Type of Home: House Home Access: Stairs to enter Technical brewer of Steps: 1 Home Layout: Two level, Able to live on main level with bedroom/bathroom Bathroom Shower/Tub: Chiropodist: Standard Bathroom Accessibility: Yes Home Equipment: Grab bars - tub/shower, Hand held shower head, Shower seat, Rollator (4 wheels), Cane - single point  Lives With: Spouse, Daughter   Functional History: Prior Function Prior Level of Function : Independent/Modified Independent Mobility Comments: uses rollator for mobility, occasional cane use  Functional Status:  Mobility: Bed Mobility Overal bed mobility: Needs Assistance Bed Mobility: Supine to Sit,  Sit to Supine Rolling: Mod assist, Min assist, +2 for physical assistance Supine to sit: Mod assist, +2 for physical assistance (HOB <30 deg) Sit to supine: Mod assist, +2 for physical assistance General bed mobility comments: cues for log roll sequencing, pt needing increased assist to roll to Lt side and minA only to Rt side, rolling x4 initially for placement of bed pan and clean-up. Transfers Overall transfer level: Needs assistance Equipment used: Rolling walker (2 wheels) Transfers: Sit  to/from Stand, Bed to chair/wheelchair/BSC Sit to Stand: +2 physical assistance, Mod assist, From elevated surface Bed to/from chair/wheelchair/BSC transfer type:: Via Lift equipment Transfer via Lift Equipment: [place] transfer comment: from elevated bed to [place] with use of momentum and +2 modA to achieve upright, pt able to stand one minute prior to sitting on [place] flaps; pt with poor eccentric control for stand>sit to chair from [place]. TotalA to chair in [place], pt unable to lift RLE for pivotal steps today Ambulation/Gait Pre-gait activities: pt able to raise Rt heel in stance but unable to lift foot off floor or to raise Rt heel for weight shifting.    ADL: ADL Overall ADL's : Needs assistance/impaired Eating/Feeding: Set up, Sitting Eating/Feeding Details (indicate cue type and reason): Left eating late lunch Grooming: Wash/dry face, Wash/dry hands, Set up, Sitting Grooming Details (indicate cue type and reason): completed EOB Upper Body Bathing: Total assistance, Bed level Lower Body Bathing: Total assistance, Bed level Lower Body Bathing Details (indicate cue type and reason): Completed s/p BM on bed pan Lower Body Dressing: Maximal assistance, Sitting/lateral leans Lower Body Dressing Details (indicate cue type and reason): attempted to don socks this session, able to reach top of foot, unable to get over toes before trunk fatiguing. Toileting- Clothing Manipulation and Hygiene:  Total assistance, Bed level General ADL Comments: Pt requiring increased assist for LB ADL's and Functional mobility.  Cognition: Cognition Overall Cognitive Status: No family/caregiver present to determine baseline cognitive functioning Orientation Level: Oriented X4 Cognition Arousal/Alertness: Awake/alert Behavior During Therapy: WFL for tasks assessed/performed Overall Cognitive Status: No family/caregiver present to determine baseline cognitive functioning General Comments: Pt able to indicate needing BM today (limited warning) and appreciative of therapist assistance to get OOB to chair. Pt c/o "feeling depressed" in hospital. Good following of 1-step commands. Difficult to assess due to: Level of arousal  Physical Exam: Blood pressure 132/78, pulse 75, temperature 98 F (36.7 C), temperature source Axillary, resp. rate 18, height 5\' 6"  (1.676 m), weight 123.5 kg, last menstrual period 11/02/2013, SpO2 98 %. Gen: no distress, normal appearing, husband at bedside HEENT: oral mucosa pink and moist, NCAT, hirsutism Cardio: Reg rate Chest: normal effort, normal rate of breathing Abd: soft, non-distended Ext: no edema Psych: pleasant, normal affect Skin: intact Neurological/MSK    Comments: Patient is alert.  Makes eye contact with examiner.  Provides name and age.  Follows simple commands.  [place]. Unable to move right lower extremity off bed currently- seems to be limited by right knee pain, 5/5 strength in other extremities. R>L knee tenderness  Results for orders placed or performed during the hospital encounter of 06/11/21 (from the past 48 hour(s))  Glucose, capillary     Status: Abnormal   Collection Time: 06/18/21  9:14 AM  Result Value Ref Range   Glucose-Capillary 133 (H) 70 - 99 mg/dL    Comment: Glucose reference range applies only to samples taken after fasting for at least 8 hours.  Glucose, capillary     Status: Abnormal   Collection Time: 06/18/21   1:25 PM  Result Value Ref Range   Glucose-Capillary 104 (H) 70 - 99 mg/dL    Comment: Glucose reference range applies only to samples taken after fasting for at least 8 hours.   Comment 1 Notify RN    Comment 2 Document in Chart   Glucose, capillary     Status: Abnormal   Collection Time: 06/18/21  4:35 PM  Result Value Ref Range  Glucose-Capillary 128 (H) 70 - 99 mg/dL    Comment: Glucose reference range applies only to samples taken after fasting for at least 8 hours.  Glucose, capillary     Status: Abnormal   Collection Time: 06/18/21  9:57 PM  Result Value Ref Range   Glucose-Capillary 124 (H) 70 - 99 mg/dL    Comment: Glucose reference range applies only to samples taken after fasting for at least 8 hours.  Glucose, capillary     Status: Abnormal   Collection Time: 06/19/21  6:15 AM  Result Value Ref Range   Glucose-Capillary 106 (H) 70 - 99 mg/dL    Comment: Glucose reference range applies only to samples taken after fasting for at least 8 hours.  Magnesium     Status: None   Collection Time: 06/19/21  6:22 AM  Result Value Ref Range   Magnesium 1.9 1.7 - 2.4 mg/dL    Comment: Performed at [facility], [place] [address], [place], [place] [address]  Ammonia     Status: Abnormal   Collection Time: 06/19/21  6:22 AM  Result Value Ref Range   Ammonia 37 (H) 9 - 35 umol/L    Comment: Performed at [facility], [place] [address], [place], [place] [address]  Hemoglobin and hematocrit, blood     Status: None   Collection Time: 06/19/21  6:22 AM  Result Value Ref Range   Hemoglobin 13.0 12.0 - 15.0 g/dL   HCT 38.4 36.0 - 46.0 %    Comment: Performed at [facility], [place] [address] [place], [place] [address]  Renal function panel     Status: Abnormal   Collection Time: 06/19/21  6:22 AM  Result Value Ref Range   Sodium 136 135 - 145 mmol/L   Potassium 3.8 3.5 - 5.1 mmol/L   Chloride 104 98 - 111 mmol/L   CO2 22 22 - 32 mmol/L   Glucose, Bld 116 (H) 70 - 99 mg/dL     Comment: Glucose reference range applies only to samples taken after fasting for at least 8 hours.   BUN <5 (L) 8 - 23 mg/dL   Creatinine, Ser 0.64 0.44 - 1.00 mg/dL   Calcium 8.4 (L) 8.9 - 10.3 mg/dL   Phosphorus 3.5 2.5 - 4.6 mg/dL   Albumin 2.5 (L) 3.5 - 5.0 g/dL   GFR, Estimated >60 >60 mL/min    Comment: (NOTE) Calculated using the CKD-EPI Creatinine Equation (2021)    Anion gap 10 5 - 15    Comment: Performed at [facility] [address] [place], [place] [address]  Glucose, capillary     Status: Abnormal   Collection Time: 06/19/21 12:24 PM  Result Value Ref Range   Glucose-Capillary 130 (H) 70 - 99 mg/dL    Comment: Glucose reference range applies only to samples taken after fasting for at least 8 hours.   Comment 1 Notify RN    Comment 2 Document in Chart   Glucose, capillary     Status: Abnormal   Collection Time: 06/19/21  3:58 PM  Result Value Ref Range   Glucose-Capillary 114 (H) 70 - 99 mg/dL    Comment: Glucose reference range applies only to samples taken after fasting for at least 8 hours.  Glucose, capillary     Status: Abnormal   Collection Time: 06/19/21  9:11 PM  Result Value Ref Range   Glucose-Capillary 163 (H) 70 - 99 mg/dL    Comment: Glucose  reference range applies only to samples taken after fasting for at least 8 hours.  Hemoglobin A1c     Status: Abnormal   Collection Time: 06/20/21  6:28 AM  Result Value Ref Range   Hgb A1c MFr Bld 6.6 (H) 4.8 - 5.6 %    Comment: (NOTE) Pre diabetes:          5.7%-6.4%  Diabetes:              >6.4%  Glycemic control for   <7.0% adults with diabetes    Mean Plasma Glucose 142.72 mg/dL    Comment: Performed at [facility] [address] [place], [place] [address]  Glucose, capillary     Status: Abnormal   Collection Time: 06/20/21  6:32 AM  Result Value Ref Range   Glucose-Capillary 142 (H) 70 - 99 mg/dL    Comment: Glucose reference range applies only to samples taken after fasting for  at least 8 hours.   MR Shoulder Right W Wo Contrast  Result Date: 06/18/2021 CLINICAL DATA:  Septic arthritis suspected, shoulder, xray done EXAM: MRI OF THE RIGHT SHOULDER WITHOUT AND WITH CONTRAST TECHNIQUE: Multiplanar, multisequence MR imaging of the shoulder was performed before and after the administration of intravenous contrast. CONTRAST:  69mL GADAVIST GADOBUTROL 1 MMOL/ML IV SOLN COMPARISON:  None. FINDINGS: Rotator cuff: There is mild distal supraspinatus infraspinatus tendinosis. There is focal high grade, partial width tearing of the far anterior fibers of the supraspinatus tendon at the footprint (coronal T2 image 12. Teres minor tendon is intact. Subscapularis tendon is intact. Muscles: No significant muscle atrophy. Streaky muscle edema in the anterior and mid fibers of the deltoid. Biceps Long Head: Intraarticular and extraarticular portions of the biceps tendon are intact. Acromioclavicular Joint: Moderate arthropathy of the acromioclavicular joint. Small amount of subacromial/subdeltoid bursal fluid. Glenohumeral Joint: No significant joint effusion.  Mild chondrosis. Labrum: Grossly intact, but evaluation is limited by lack of intraarticular fluid/contrast. Bones: No fracture or dislocation. No aggressive osseous lesion. Mild generalized low T1 marrow signal, as can be seen in hematologic derangement. Other: No fluid collection or hematoma. IMPRESSION: Mild distal supraspinatus and infraspinatus tendinosis. Focal high-grade, partial width tearing of the far anterior fibers of the supraspinatus tendon at the footprint. Mild subacromial-subdeltoid bursitis. No convincing findings to suggest septic arthritis. No significant joint effusion or evidence of osteomyelitis. 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 Mild generalized low T1 marrow signal which, can be seen in the setting of hematologic derangement including anemia, correlate with CBC. Electronically Signed    By: Maurine Simmering M.D.   On: 06/18/2021 15:58       Medical Problem List and Plan: 1. Functional deficits secondary to H influenza bacteremia and meningitis in the setting of CSF leak/bilateral temporal bone abnormality  -patient may shower  -ELOS/Goals: 20 days MinA  Admit to CIR 2.  Antithrombotics: -DVT/anticoagulation:  Pharmaceutical: Lovenox  -antiplatelet therapy: Aspirin 81 mg daily 3. Bilateral knee pain R>L: Voltaren gel as directed, oxycodone as needed. Icing order for bilateral knees 4. Mood: Wellbutrin 300 mg daily, Zoloft 100 mg daily, Xanax 0.25 mg twice daily as needed  -antipsychotic agents: N/A 5. Neuropsych: This patient is capable of making decisions on her own behalf. 6. Skin/Wound Care: Routine skin checks 7. Fluids/Electrolytes/Nutrition: Routine in and outs with follow-up chemistries 8.  ID.  IV ceftriaxone 2 g every 12 hours as well as Flagyl 500 mg 3 times daily through 06/25/2021.  Patient will need repeat MRI  prior to completion of antibiotics per Dr. Drucilla Schmidt. 9.  Hypertension. Well controlled, continue Lisinopril 5 mg daily.  Monitor with increased mobility 10.  Diabetes mellitus.  Hemoglobin A1c 6.6.  SSI.  Patient on Lantus insulin 10 units nightly PRIOR TO ADMISSION as well as Victoza 1.2 mg dose daily.  Resume as needed 11.  Tobacco abuse.  NicoDerm patch.  Provide counseling 12.  Right shoulder pain.  MRI right shoulder mild distal supraspinatus and infraspinatus tendinosis.  Focal high-grade partial width tearing of the far anterior fibers of the supraspinatus tendon.  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  Conservative care continue Voltaren gel 13.  Obesity.  BMI 43.95.  Dietary follow-up. Caregiver training on ergonomic assistance techniques as she will likely continue to require some assistance upon discharge.   I have personally performed a face to face diagnostic evaluation, including, but not limited to relevant history and  physical exam findings, of this patient and developed relevant assessment and plan.  Additionally, I have reviewed and concur with the physician assistant's documentation above.  Leeroy Cha, MD  Lavon Paganini [place], PA-C 06/20/2021

## 2021-06-20 NOTE — Discharge Instructions (Addendum)
Inpatient Rehab Discharge Instructions  Lisa [name] Discharge date and time: No discharge date for patient encounter.   Activities/Precautions/ Functional Status: Activity: As tolerated Diet: Diabetic diet Wound Care: Routine skin checks Functional status:  ___ No restrictions     ___ Walk up steps independently ___ 24/7 supervision/assistance   ___ Walk up steps with assistance ___ Intermittent supervision/assistance  ___ Bathe/dress independently ___ Walk with walker     __x_ Bathe/dress with assistance ___ Walk Independently    ___ Shower independently ___ Walk with assistance    ___ Shower with assistance ___ No alcohol     ___ Return to work/school ________  COMMUNITY REFERRALS UPON DISCHARGE:    Outpatient: PT     OT                 Agency:[facility] [place] [facility]              Appointment Date/Time:*Please expect follow-up within 7-10 business days to schedule your appointment. If you have not received follow-up, be sure to contact the site directly.*  Medical Equipment/Items Ordered: 3in1 bedside commode                                                 Agency/Supplier: [facility] [phone]   Special Instructions: No driving smoking or alcohol   Follow-up with PCP Dr. Josephina Shih [phone] in regards to scheduling for a mammogram for follow-up of breast nodule   My questions have been answered and I understand these instructions. I will adhere to these goals and the provided educational materials after my discharge from the hospital.  Patient/Caregiver Signature _______________________________ Date __________  Clinician Signature _______________________________________ Date __________  Please bring this form and your medication list with you to all your follow-up doctor's appointments.

## 2021-06-20 NOTE — H&P (Signed)
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.  Per chart review patient lives with spouse.  1 level home one-step to entry.  Independent prior to admission with occasional cane.  Presented 06/11/2021 with altered mental status and fever.  She was placed on broad-spectrum antibiotics.  Cranial CT scan showed no acute intracranial abnormality.  Complete opacification of both mastoid air cells.  MRI of the brain diffuse leptomeningeal enhancement involving both cerebral hemispheres, highly suggestive of acute meningitis.  Patient did require intubation for airway protection and extubated 06/14/2021.  Lumbar puncture with 6004-25 white blood cells 84% neutrophils and abundant white blood cells but no organisms initially seen, protein 283 glucose 32.  Infectious disease consulted as well as neurology for H influenza bacteremia and meningitis in the setting of CSF leak.  Currently completing a 14-day course of IV ceftriaxone 2 g every 12 hours as well as IV/p.o. Flagyl 500 mg 3 times daily ending 06/25/2021.  Hospital course she did initially require Precedex for agitation.  Recommendations are repeat MRI of the brain before completion of antibiotic course per Dr. Drucilla Schmidt.  Lovenox initiated for DVT prophylaxis.  Currently on mechanical soft diet.  Therapy evaluations completed due to patient decreased functional ability altered mental status was admitted for a comprehensive rehab program. She complaints of right knee pain.   Review of Systems  Constitutional:  Positive for fever.  HENT:  Negative for hearing loss.   Eyes:  Negative for  blurred vision and double vision.  Respiratory:  Negative for cough and shortness of breath.   Cardiovascular:  Negative for chest pain, palpitations and leg swelling.  Gastrointestinal:  Positive for constipation. Negative for heartburn, nausea and vomiting.  Genitourinary:  Negative for dysuria, flank pain and hematuria.  Musculoskeletal:  Positive for joint pain and myalgias.  Skin:  Negative for rash.  Neurological:  Positive for headaches.  Psychiatric/Behavioral:  Positive for depression. The patient has insomnia.        Anxiety  All other systems reviewed and are negative. 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    Polycystic ovarian disease    Past Surgical History:  Procedure Laterality Date   csf leak in skull  2005   noted failed attempt to close CSF leak in skull   DILATION AND EVACUATION  1990   miscarriage   IR FLUORO GUIDED NEEDLE PLC ASPIRATION/INJECTION LOC  06/12/2021   RADIOLOGY WITH ANESTHESIA N/A 06/12/2021   Procedure: IR WITH ANESTHESIA;  Surgeon: Radiologist, Medication, MD;  Location: [facility];  Service: Radiology;  Laterality: N/A;   TONSILLECTOMY AND ADENOIDECTOMY  1973   Family History  Problem Relation Age of Onset   Arthritis Mother    Hypertension Mother    Arthritis Father    Prostate cancer Father    Heart disease Father    Marfan syndrome Father  Hyperlipidemia Sister    Diabetes Sister    Marfan syndrome Sister    Breast cancer Sister    Breast cancer Maternal Grandmother    Hypertension Maternal Grandmother    Diabetes Paternal Grandmother    Social History:  reports that she has been smoking. She has a 52.50 pack-year smoking history. She has never used smokeless tobacco. She reports that she does not drink alcohol and does not  use drugs. Allergies:  Allergies  Allergen Reactions   Statins Other (See Comments)    Other reaction(s): Other CSF leaking out nose. Headache   Glimepiride Other (See Comments)    Hypoglicemia   Medications Prior to Admission  Medication Sig Dispense Refill   acetaminophen (TYLENOL) 325 MG tablet Take 2 tablets (650 mg total) by mouth every 6 (six) hours as needed for fever (temp > 101).     albuterol (PROVENTIL HFA;VENTOLIN HFA) 108 (90 BASE) MCG/ACT inhaler Inhale 2 puffs into the lungs every 6 (six) hours as needed for wheezing. 1 Inhaler 0   ALPRAZolam (XANAX) 0.25 MG tablet Take 1 tablet (0.25 mg total) by mouth 2 (two) times daily as needed for anxiety. 30 tablet 0   aspirin EC 81 MG tablet Take 81 mg by mouth daily. Swallow whole.     [START ON 06/21/2021] buPROPion (WELLBUTRIN XL) 300 MG 24 hr tablet Take 1 tablet (300 mg total) by mouth daily.     cefTRIAXone 2 g in sodium chloride 0.9 % 100 mL Inject 2 g into the vein every 12 (twelve) hours for 5 days.     cyclobenzaprine (FLEXERIL) 10 MG tablet Take 1 tablet (10 mg) by mouth daily at bedtime as needed (Patient taking differently: Take 10 mg by mouth daily as needed for muscle spasms.) 30 tablet 0   diclofenac (VOLTAREN) 75 MG EC tablet Take 1 tablet (75 mg total) by mouth 2 (two) times daily. 180 tablet 0   diclofenac Sodium (VOLTAREN) 1 % GEL Apply 4 g topically every 6 (six) hours as needed (Shoulder pain).     enoxaparin (LOVENOX) 60 MG/0.6ML injection Inject 0.6 mLs (60 mg total) into the skin daily. 0 mL    Eszopiclone 3 MG TABS TAKE 1 TABLET BY MOUTH ONCE DAILY IMMEDIATELY BEFORE BEDTIME 30 tablet 0   insulin aspart (NOVOLOG) 100 UNIT/ML injection Inject 0-15 Units into the skin 4 (four) times daily -  before meals and at bedtime. 10 mL 11   lisinopril (ZESTRIL) 5 MG tablet Take 5 mg by mouth daily.     metroNIDAZOLE (FLAGYL) 500 MG tablet Take 1 tablet (500 mg total) by mouth every 8 (eight) hours for 5 days. 15 tablet 0    [START ON 06/21/2021] nicotine (NICODERM CQ - DOSED IN MG/24 HOURS) 14 mg/24hr patch Place 1 patch (14 mg total) onto the skin daily at 6 (six) AM. 28 patch 0   ondansetron (ZOFRAN) 4 MG/2ML SOLN injection Inject 2 mLs (4 mg total) into the vein every 8 (eight) hours as needed for nausea or vomiting. 2 mL 0   oxyCODONE (OXY IR/ROXICODONE) 5 MG immediate release tablet Take 1 tablet (5 mg total) by mouth every 6 (six) hours as needed for severe pain. 30 tablet 0   pantoprazole (PROTONIX) 40 MG tablet Take 1 tablet (40 mg total) by mouth at bedtime.     [START ON 06/21/2021] sertraline (ZOLOFT) 100 MG tablet Take 1 tablet (100 mg total) by mouth daily.     spironolactone (ALDACTONE) 25 MG tablet  TAKE 1 TABLET BY MOUTH ONCE DAILY (Patient taking differently: Take 25 mg by mouth daily.) 90 tablet 2   tiZANidine (ZANAFLEX) 4 MG tablet Take 1 tablet by mouth every evening for muscle spasms and 1/2 tab as needed during daytime (Patient taking differently: Take 2-4 mg by mouth daily as needed for muscle spasms.) 45 tablet 2   zolpidem (AMBIEN) 10 MG tablet TAKE ONE TABLET (10 MG DOSE) BY MOUTH AT BEDTIME AS NEEDED FOR SLEEP. (Patient not taking: Reported on 06/11/2021) 30 tablet 0   zolpidem (AMBIEN) 5 MG tablet Take 1 tablet (5 mg total) by mouth at bedtime as needed for sleep. 30 tablet 0    Drug Regimen Review Drug regimen was reviewed and remains appropriate with no significant issues identified  Home: Home Living Family/patient expects to be discharged to:: Private residence Living Arrangements: Spouse/significant other (adult daughter lives with them) Available Help at Discharge:  (spouse would arrnage 24/7 assist) Type of Home: House Home Access: Stairs to enter Technical brewer of Steps: 1 Home Layout: Two level, Able to live on main level with bedroom/bathroom Bathroom Shower/Tub: Chiropodist: Standard Bathroom Accessibility: Yes Home Equipment: Grab bars -  tub/shower, Hand held shower head, Shower seat, Rollator (4 wheels), Cane - single point  Lives With: Spouse, Daughter   Functional History: Prior Function Prior Level of Function : Independent/Modified Independent Mobility Comments: uses rollator for mobility, occasional cane use   Functional Status:  Mobility: Bed Mobility Overal bed mobility: Needs Assistance Bed Mobility: Supine to Sit, Sit to Supine Rolling: Mod assist, Min assist, +2 for physical assistance Supine to sit: Mod assist, +2 for physical assistance (HOB <30 deg) Sit to supine: Mod assist, +2 for physical assistance General bed mobility comments: cues for log roll sequencing, pt needing increased assist to roll to Lt side and minA only to Rt side, rolling x4 initially for placement of bed pan and clean-up. Transfers Overall transfer level: Needs assistance Equipment used: Rolling walker (2 wheels) Transfers: Sit to/from Stand, Bed to chair/wheelchair/BSC Sit to Stand: +2 physical assistance, Mod assist, From elevated surface Bed to/from chair/wheelchair/BSC transfer type:: Via Lift equipment Transfer via Lift Equipment: Stedy General transfer comment: from elevated bed to [place] with use of momentum and +2 modA to achieve upright, pt able to stand one minute prior to sitting on Stedy flaps; pt with poor eccentric control for stand>sit to chair from [place]. TotalA to chair in [place], pt unable to lift RLE for pivotal steps today Ambulation/Gait Pre-gait activities: pt able to raise Rt heel in stance but unable to lift foot off floor or to raise Rt heel for weight shifting.   ADL: ADL Overall ADL's : Needs assistance/impaired Eating/Feeding: Set up, Sitting Eating/Feeding Details (indicate cue type and reason): Left eating late lunch Grooming: Wash/dry face, Wash/dry hands, Set up, Sitting Grooming Details (indicate cue type and reason): completed EOB Upper Body Bathing: Total assistance, Bed level Lower Body Bathing:  Total assistance, Bed level Lower Body Bathing Details (indicate cue type and reason): Completed s/p BM on bed pan Lower Body Dressing: Maximal assistance, Sitting/lateral leans Lower Body Dressing Details (indicate cue type and reason): attempted to don socks this session, able to reach top of foot, unable to get over toes before trunk fatiguing. Toileting- Clothing Manipulation and Hygiene: Total assistance, Bed level General ADL Comments: Pt requiring increased assist for LB ADL's and Functional mobility.   Cognition: Cognition Overall Cognitive Status: No family/caregiver present to determine baseline cognitive functioning Orientation  Level: Oriented X4 Cognition Arousal/Alertness: Awake/alert Behavior During Therapy: WFL for tasks assessed/performed Overall Cognitive Status: No family/caregiver present to determine baseline cognitive functioning General Comments: Pt able to indicate needing BM today (limited warning) and appreciative of therapist assistance to get OOB to chair. Pt c/o "feeling depressed" in hospital. Good following of 1-step commands  Physical Exam: Blood pressure 134/81, pulse 78, temperature 98.3 F (36.8 C), temperature source Oral, resp. rate 18, height 5\' 6"  (1.676 m), weight 119.7 kg, last menstrual period 11/02/2013, SpO2 92 %. Gen: no distress, normal appearing, husband at bedside HEENT: oral mucosa pink and moist, NCAT, hirsutism Cardio: Reg rate Chest: normal effort, normal rate of breathing Abd: soft, non-distended Ext: no edema Psych: pleasant, normal affect Skin: intact Neurological/MSK    Comments: Patient is alert and oriented x4. Makes eye contact with examiner.  Provides name and age.  Follows simple commands.  [place]. Unable to move right lower extremity off bed currently- seems to be limited by right knee pain, 5/5 strength in other extremities. R>L knee tenderness  Results for orders placed or performed during the hospital  encounter of 06/20/21 (from the past 48 hour(s))  Glucose, capillary     Status: Abnormal   Collection Time: 06/20/21  4:44 PM  Result Value Ref Range   Glucose-Capillary 107 (H) 70 - 99 mg/dL    Comment: Glucose reference range applies only to samples taken after fasting for at least 8 hours.   [place] EKG SITE RITE  Result Date: 06/20/2021 If Site Rite image not attached, placement could not be confirmed due to current cardiac rhythm.      Medical Problem List and Plan: 1. Functional deficits secondary to H influenza bacteremia and meningitis in the setting of CSF leak/bilateral temporal bone abnormality  -patient may shower  -ELOS/Goals: 20 days MinA  Admit to CIR 2.  Antithrombotics: -DVT/anticoagulation:  Pharmaceutical: Lovenox  -antiplatelet therapy: Aspirin 81 mg daily 3. Bilateral knee pain R>L: Voltaren gel as directed, oxycodone as needed. Placed icing order for bilateral knees 15 minutes three times per day 4. Mood: Wellbutrin 300 mg daily, Zoloft 100 mg daily, Xanax 0.25 mg twice daily as needed  -antipsychotic agents: N/A 5. Neuropsych: This patient is capable of making decisions on her own behalf. 6. Skin/Wound Care: Routine skin checks 7. Fluids/Electrolytes/Nutrition: Routine in and outs with follow-up chemistries 8.  ID.  IV ceftriaxone 2 g every 12 hours as well as Flagyl 500 mg 3 times daily through 06/25/2021.  Patient will need repeat MRI prior to completion of antibiotics per Dr. Drucilla Schmidt. 9.  Hypertension. Well controlled, continue Lisinopril 5 mg daily.  Monitor with increased mobility 10.  Diabetes mellitus.  Hemoglobin A1c 6.6.  SSI.  Patient on Lantus insulin 10 units nightly PRIOR TO ADMISSION as well as Victoza 1.2 mg dose daily.  Resume as needed 11.  Tobacco abuse.  NicoDerm patch.  Provide counseling 12.  Right shoulder pain.  MRI right shoulder mild distal supraspinatus and infraspinatus tendinosis.  Focal high-grade partial width tearing of the far anterior  fibers of the supraspinatus tendon.  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  Conservative care continue Voltaren gel 13.  Obesity.  BMI 43.95.  Dietary follow-up. Caregiver training on ergonomic assistance techniques as she will likely continue to require some assistance upon discharge.   I have personally performed a face to face diagnostic evaluation, including, but not limited to relevant history and physical exam findings, of this patient and developed relevant  assessment and plan.  Additionally, I have reviewed and concur with the physician assistant's documentation above.  Lavon Paganini Angiulli, PA-C 06/20/2021   Izora Ribas, MD 06/20/2021

## 2021-06-21 DIAGNOSIS — G009 Bacterial meningitis, unspecified: Secondary | ICD-10-CM | POA: Diagnosis not present

## 2021-06-21 DIAGNOSIS — E669 Obesity, unspecified: Secondary | ICD-10-CM

## 2021-06-21 DIAGNOSIS — R7401 Elevation of levels of liver transaminase levels: Secondary | ICD-10-CM

## 2021-06-21 DIAGNOSIS — M170 Bilateral primary osteoarthritis of knee: Secondary | ICD-10-CM

## 2021-06-21 DIAGNOSIS — E1169 Type 2 diabetes mellitus with other specified complication: Secondary | ICD-10-CM

## 2021-06-21 DIAGNOSIS — I10 Essential (primary) hypertension: Secondary | ICD-10-CM

## 2021-06-21 LAB — CBC WITH DIFFERENTIAL/PLATELET
Abs Immature Granulocytes: 0.11 10*3/uL — ABNORMAL HIGH (ref 0.00–0.07)
Basophils Absolute: 0.1 10*3/uL (ref 0.0–0.1)
Basophils Relative: 1 %
Eosinophils Absolute: 0.4 10*3/uL (ref 0.0–0.5)
Eosinophils Relative: 3 %
HCT: 36.2 % (ref 36.0–46.0)
Hemoglobin: 11.9 g/dL — ABNORMAL LOW (ref 12.0–15.0)
Immature Granulocytes: 1 %
Lymphocytes Relative: 16 %
Lymphs Abs: 1.7 10*3/uL (ref 0.7–4.0)
MCH: 30.7 pg (ref 26.0–34.0)
MCHC: 32.9 g/dL (ref 30.0–36.0)
MCV: 93.3 fL (ref 80.0–100.0)
Monocytes Absolute: 1 10*3/uL (ref 0.1–1.0)
Monocytes Relative: 9 %
Neutro Abs: 7.5 10*3/uL (ref 1.7–7.7)
Neutrophils Relative %: 70 %
Platelets: 387 10*3/uL (ref 150–400)
RBC: 3.88 MIL/uL (ref 3.87–5.11)
RDW: 14.3 % (ref 11.5–15.5)
WBC: 10.8 10*3/uL — ABNORMAL HIGH (ref 4.0–10.5)
nRBC: 0 % (ref 0.0–0.2)

## 2021-06-21 LAB — COMPREHENSIVE METABOLIC PANEL
ALT: 55 U/L — ABNORMAL HIGH (ref 0–44)
AST: 46 U/L — ABNORMAL HIGH (ref 15–41)
Albumin: 2.6 g/dL — ABNORMAL LOW (ref 3.5–5.0)
Alkaline Phosphatase: 48 U/L (ref 38–126)
Anion gap: 8 (ref 5–15)
BUN: 5 mg/dL — ABNORMAL LOW (ref 8–23)
CO2: 25 mmol/L (ref 22–32)
Calcium: 8.3 mg/dL — ABNORMAL LOW (ref 8.9–10.3)
Chloride: 103 mmol/L (ref 98–111)
Creatinine, Ser: 0.74 mg/dL (ref 0.44–1.00)
GFR, Estimated: >60 mL/min (ref >60)
Glucose, Bld: 130 mg/dL — ABNORMAL HIGH (ref 70–99)
Potassium: 3.9 mmol/L (ref 3.5–5.1)
Sodium: 136 mmol/L (ref 135–145)
Total Bilirubin: 0.4 mg/dL (ref 0.3–1.2)
Total Protein: 5.7 g/dL — ABNORMAL LOW (ref 6.5–8.1)

## 2021-06-21 LAB — GLUCOSE, CAPILLARY
Glucose-Capillary: 133 mg/dL — ABNORMAL HIGH (ref 70–99)
Glucose-Capillary: 128 mg/dL — ABNORMAL HIGH (ref 70–99)
Glucose-Capillary: 169 mg/dL — ABNORMAL HIGH (ref 70–99)
Glucose-Capillary: 159 mg/dL — ABNORMAL HIGH (ref 70–99)

## 2021-06-21 MED ORDER — ONDANSETRON HCL 4 MG PO TABS
4.0000 mg | ORAL_TABLET | Freq: Three times a day (TID) | ORAL | Status: DC | PRN
  Administered 2021-06-21 – 2021-06-24 (×2): 4 mg via ORAL
  Filled 2021-06-21 (×3): qty 1

## 2021-06-21 MED ORDER — ENSURE MAX PROTEIN PO LIQD
11.0000 [oz_av] | Freq: Every day | ORAL | Status: DC
  Administered 2021-06-21 – 2021-07-02 (×10): 11 [oz_av] via ORAL
  Filled 2021-06-21: qty 330

## 2021-06-21 NOTE — Plan of Care (Signed)
°  Problem: RH Balance Goal: LTG Patient will maintain dynamic sitting balance (PT) Description: LTG:  Patient will maintain dynamic sitting balance with assistance during mobility activities (PT) Flowsheets (Taken 06/21/2021 2044) LTG: Pt will maintain dynamic sitting balance during mobility activities with:: Supervision/Verbal cueing Goal: LTG Patient will maintain dynamic standing balance (PT) Description: LTG:  Patient will maintain dynamic standing balance with assistance during mobility activities (PT) Flowsheets (Taken 06/21/2021 2044) LTG: Pt will maintain dynamic standing balance during mobility activities with:: Contact Guard/Touching assist   Problem: RH Bed Mobility Goal: LTG Patient will perform bed mobility with assist (PT) Description: LTG: Patient will perform bed mobility with assistance, with/without cues (PT). Flowsheets (Taken 06/21/2021 2044) LTG: Pt will perform bed mobility with assistance level of: Minimal Assistance - Patient > 75%   Problem: RH Bed to Chair Transfers Goal: LTG Patient will perform bed/chair transfers w/assist (PT) Description: LTG: Patient will perform bed to chair transfers with assistance (PT). Flowsheets (Taken 06/21/2021 2044) LTG: Pt will perform Bed to Chair Transfers with assistance level: Minimal Assistance - Patient > 75%   Problem: RH Car Transfers Goal: LTG Patient will perform car transfers with assist (PT) Description: LTG: Patient will perform car transfers with assistance (PT). Flowsheets (Taken 06/21/2021 2044) LTG: Pt will perform car transfers with assist:: Minimal Assistance - Patient > 75%   Problem: RH Furniture Transfers Goal: LTG Patient will perform furniture transfers w/assist (OT/PT) Description: LTG: Patient will perform furniture transfers  with assistance (OT/PT). Flowsheets (Taken 06/21/2021 2044) LTG: Pt will perform furniture transfers with assist:: Minimal Assistance - Patient > 75%   Problem: RH Ambulation Goal:  LTG Patient will ambulate in controlled environment (PT) Description: LTG: Patient will ambulate in a controlled environment, # of feet with assistance (PT). Flowsheets (Taken 06/21/2021 2044) LTG: Pt will ambulate in controlled environ  assist needed:: Contact Guard/Touching assist LTG: Ambulation distance in controlled environment: up to 75 ft using LRAD Goal: LTG Patient will ambulate in home environment (PT) Description: LTG: Patient will ambulate in home environment, # of feet with assistance (PT). Flowsheets (Taken 06/21/2021 2044) LTG: Ambulation distance in home environment: at least 50 ft using LRAD   Problem: RH Stairs Goal: LTG Patient will ambulate up and down stairs w/assist (PT) Description: LTG: Patient will ambulate up and down # of stairs with assistance (PT) Flowsheets (Taken 06/21/2021 2044) LTG: Pt will ambulate up/down stairs assist needed:: Minimal Assistance - Patient > 75% LTG: Pt will  ambulate up and down number of stairs: at least one step with HR setup as per home environment in order to enter/ exit home

## 2021-06-21 NOTE — Evaluation (Signed)
Physical Therapy Assessment and Plan  Patient Details  Name: Lisa [name] MRN: [id number] Date of Birth: 05-Apr-1961  PT Diagnosis: Difficulty walking, Dizziness and giddiness, Low back pain, Muscle weakness, Osteoarthritis, Pain in joint, and Pain in shoulder Rehab Potential: Good ELOS: 20 days   Today's Date: 06/21/2021 PT Individual Time: 3545-6256 PT Individual Time Calculation (min): 56 min    Hospital Problem: Principal Problem:   Acute bacterial meningitis   Past Medical History:  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  Per chart review patient lives with spouse.  1 level home one-step to entry.  Independent prior to admission with occasional cane.  Presented 06/11/2021 with altered mental status and fever.  She was placed on broad-spectrum antibiotics.  Cranial CT scan showed no acute intracranial abnormality.  Complete  opacification of both mastoid air cells.  MRI of the brain diffuse leptomeningeal enhancement involving both cerebral hemispheres, highly suggestive of acute meningitis.  Patient did require intubation for airway protection and extubated 06/14/2021.  Lumbar puncture with 6004-25 white blood cells 84% neutrophils and abundant white blood cells but no organisms initially seen, protein 283 glucose 32.  Infectious disease consulted as well as neurology for H influenza bacteremia and meningitis in the setting of CSF leak.  Currently completing a 14-day course of IV ceftriaxone 2 g every 12 hours as well as IV/p.o. Flagyl 500 mg 3 times daily ending 06/25/2021.  Hospital course she did initially require Precedex for agitation.  Recommendations are repeat MRI of the brain before completion of antibiotic course per Dr. Drucilla Schmidt.  Lovenox initiated for DVT prophylaxis.  Currently on mechanical soft diet.  Therapy evaluations completed due to patient decreased functional ability altered mental status was admitted for a comprehensive rehab program. She complaints of right knee pain. .  Patient transferred to CIR on 06/20/2021 .   Patient currently requires max assist with mobility secondary to muscle weakness, decreased cardiorespiratoy endurance, ,, and decreased standing balance, decreased postural control, and decreased balance strategies.  Prior to hospitalization, patient was modified independent  with mobility and lived with Spouse, Daughter in a House home.  Home access is 1Stairs to enter.  Patient will benefit from skilled PT intervention to maximize safe functional mobility, minimize fall risk, and decrease caregiver burden for planned discharge home with  24 hour assist.  Anticipate patient will benefit from follow up [place] at discharge.  PT - End of Session Activity Tolerance: Tolerates 10 - 20 min activity with multiple rests Endurance Deficit: Yes Endurance Deficit Description: limited by pain and weakness PT  Assessment Rehab Potential (ACUTE/IP ONLY): Good PT Barriers to Discharge: [place] home environment;Decreased caregiver support;Home environment access/layout;Wound Care;Insurance for SNF coverage;Lack of/limited family support;Weight;Medication compliance;Pending surgery;Behavior;Nutrition means;Other (comments) PT Patient demonstrates impairments in the following area(s): Balance;Edema;Endurance;Motor;Pain;Perception;Safety;Sensory;Skin Integrity PT Transfers Functional Problem(s): Bed Mobility;Bed to Chair;Car;Floor PT Locomotion Functional Problem(s): Ambulation;Stairs PT Plan PT Intensity: Minimum of 1-2 x/day ,45 to 90 minutes PT Frequency: 5 out of 7 days PT Duration Estimated Length of Stay: 20 days PT Treatment/Interventions: Ambulation/gait training;Community reintegration;DME/adaptive equipment instruction;Neuromuscular re-education;Psychosocial support;Stair training;UE/LE Strength taining/ROM;Wheelchair propulsion/positioning;UE/LE Coordination activities;Therapeutic Activities;Skin care/wound management;Functional electrical stimulation;Discharge planning;Balance/vestibular training;Pain management;Cognitive remediation/compensation;Disease management/prevention;Functional mobility training;Patient/family education;Therapeutic Exercise;Visual/perceptual remediation/compensation PT Transfers Anticipated Outcome(s): CGA PT Locomotion Anticipated Outcome(s): CGA PT Recommendation Recommendations for Other Services: Therapeutic Recreation consult Therapeutic Recreation Interventions: Pet therapy;Kitchen group;Stress management;Outing/community reintergration Follow Up Recommendations: Home health PT Patient destination: Home   PT Evaluation Precautions/Restrictions Precautions Precautions: Fall Precaution Comments: hx of back and R knee arthrtis, per pt can not lift more than 10 lbs Restrictions Weight Bearing Restrictions: No General   Vital SignsTherapy Vitals Temp: 98.3  F (36.8 C) Temp Source: Oral Pulse Rate: 77 Resp: 17 BP: 125/69 Patient Position (if appropriate): Lying Oxygen Therapy SpO2: 94 % O2 Device: Room Air Pain Pain Assessment Pain Scale: 0-10 Pain Score: 8  Pain Type: Chronic pain Pain Location: Back Pain Orientation: Left Pain Descriptors / Indicators: Sharp;Discomfort Pain Onset: On-going Patients Stated Pain Goal: 0 Pain Intervention(s): Medication (See eMAR);Repositioned (RN providing medication) Pain Interference Pain Interference Pain Effect on Sleep: 3. Frequently Pain Interference with Therapy Activities: 2. Occasionally Pain Interference with Day-to-Day Activities: 3. Frequently Home Living/Prior Functioning Home Living Available Help at Discharge: Available PRN/intermittently;Family Type of Home: House Home Access: Stairs to enter [employer] of Steps: 1 Entrance Stairs-Rails: None Home Layout: Two level;Able to live on main level with bedroom/bathroom Bathroom Shower/Tub: Chiropodist: Standard Bathroom Accessibility: Yes  Lives With: Spouse;Daughter Prior Function Level of Independence: Independent with basic ADLs;Independent with gait (limited cooking in sitting)  Able to Take Stairs?: Yes (could do 1 front step) Driving: Yes (limited) Vocation: Retired Vision/Perception  Vision - History Ability to See in Adequate Light: 0 Adequate Perception Perception: Within Functional Limits Praxis Praxis: Intact  Cognition Overall Cognitive Status: Within Functional Limits for tasks assessed Arousal/Alertness: Awake/alert Orientation Level: Oriented X4 Year: 2023 Month: January Day of Week: Correct Memory: Appears intact Awareness: Appears intact Problem Solving: Appears intact Safety/Judgment: Appears intact Sensation Sensation Light Touch: Appears Intact Coordination Gross Motor Movements are Fluid and Coordinated: No Fine Motor Movements are Fluid and Coordinated:  Yes Coordination and Movement Description: significant weakness Heel Shin Test: able but limited initially by body habitus Motor  Motor Motor: Other (comment) Motor - Skilled Clinical Observations: motor is present and smooth, however is limited by general deconditioning and significant weakness   Trunk/Postural Assessment  Cervical Assessment Cervical Assessment: Exceptions to [facility] (forward head) Thoracic Assessment Thoracic Assessment: Exceptions to [facility] (rounded shoulders) Lumbar Assessment Lumbar Assessment: Exceptions to [facility] (posterior pelvic tilt) Postural Control Postural Control: Deficits on evaluation (required BUE support d/t deconditioning) Trunk Control: has difficulty with full trunk extension d/t chronic back pain 2/2 bulging discs  Balance Static Sitting Balance Static Sitting - Level of Assistance: 7:  Independent;6: Modified independent (Device/Increase time) Dynamic Sitting Balance Dynamic Sitting - Level of Assistance: 5: Stand by assistance Static Standing Balance Static Standing - Level of Assistance: 1: +1 Total assist (with help from stedy) Dynamic Standing Balance Dynamic Standing - Level of Assistance: Not tested (comment) (unable to perform) Extremity Assessment      RLE Strength RLE Overall Strength: Deficits Right Hip Flexion: 3-/5 Right Hip Extension: 3-/5 Right Hip ABduction: 3+/5 Right Hip ADduction: 3/5 Right Knee Flexion: 4-/5 Right Knee Extension: 4-/5 Right Ankle Dorsiflexion: 3-/5 Right Ankle Plantar Flexion: 3-/5 LLE Strength LLE Overall Strength: Deficits Left Hip Flexion: 3-/5 Left Hip Extension: 3-/5 Left Hip ABduction: 3/5 Left Hip ADduction: 3/5 Left Knee Flexion: 4-/5 Left Knee Extension: 4/5 Left Ankle Dorsiflexion: 4/5 Left Ankle Plantar Flexion: 4/5  Care Tool Care Tool Bed Mobility Roll left and right activity   Roll left and right assist level: Minimal Assistance - Patient > 75%    Sit to lying activity   Sit to  lying assist level: Moderate Assistance - Patient 50 - 74%    Lying to sitting on side of bed activity   Lying to sitting on side of bed assist level: the ability to move from lying on the back to sitting on the side of the bed with no back support.: Moderate Assistance - Patient 50 - 74%     Care Tool Transfers Sit to stand transfer   Sit to stand assist level: Total Assistance - Patient < 25% (STEDY)    Chair/bed transfer   Chair/bed transfer assist level: Dependent - mechanical lift     Toilet transfer   Assist Level: Dependent - Patient 0% (STEDY)    Car transfer Car transfer activity did not occur: Safety/medical concerns        Care Tool Locomotion Ambulation Ambulation activity did not occur: Safety/medical concerns        Walk 10 feet activity Walk 10 feet activity did not occur: Safety/medical concerns       Walk 50 feet with 2 turns activity Walk 50 feet with 2 turns activity did not occur: Safety/medical concerns      Walk 150 feet activity Walk 150 feet activity did not occur: Safety/medical concerns      Walk 10 feet on uneven surfaces activity Walk 10 feet on uneven surfaces activity did not occur: Safety/medical concerns      Stairs Stair activity did not occur: Safety/medical concerns        Walk up/down 1 step activity Walk up/down 1 step or curb (drop down) activity did not occur: Safety/medical concerns      Walk up/down 4 steps activity Walk up/down 4 steps activity did not occur: Safety/medical concerns      Walk up/down 12 steps activity Walk up/down 12 steps activity did not occur: Safety/medical concerns      Pick up small objects from floor Pick up small object from the floor (from standing position) activity did not occur: Safety/medical concerns      Wheelchair Is the patient using a wheelchair?: Yes Type of Wheelchair: Manual Wheelchair activity did not occur: Safety/medical concerns      Wheel 50 feet with 2 turns activity  Wheelchair 50 feet with 2 turns activity did not occur: Safety/medical concerns    Wheel 150 feet activity Wheelchair 150 feet activity did not occur: Safety/medical concerns      Refer to Care Plan for Long Term Goals  SHORT TERM GOAL WEEK 1 PT Short Term Goal  1 (Week 1): Pt will perform rolling in bed from side to side for pressure relief with overall CGA. PT Short Term Goal 2 (Week 1): Pt will perform supine<>sit with MinA using bed features. PT Short Term Goal 3 (Week 1): Pt will perform sit<>stand to RW with MaxA +1. PT Short Term Goal 4 (Week 1): Pt will initiate gait training. PT Short Term Goal 5 (Week 1): Pt will perform stand pivot transfers with ModA.  Recommendations for other services: Therapeutic Recreation  Pet therapy, Kitchen group, Stress management, and Outing/community reintegration  Skilled Therapeutic Intervention Mobility Bed Mobility Bed Mobility: Rolling Right;Rolling Left;Right Sidelying to Sit;Sit to Sidelying Right Rolling Right: Contact Guard/Touching assist Rolling Left: Contact Guard/Touching assist Right Sidelying to Sit: Minimal Assistance - Patient > 75%;Moderate Assistance - Patient 50-74% Supine to Sit: Minimal Assistance - Patient > 75% Sit to Supine: Moderate Assistance - Patient 50-74% Sit to Sidelying Right: Moderate Assistance - Patient 50-74% Transfers Transfers: Sit to Stand;Stand to Sit Sit to Stand: Maximal Assistance - Patient 25-49%;Moderate Assistance - Patient 50-74% Stand to Sit: Moderate Assistance - Patient 50-74% Transfer (Assistive device): Other (Comment) (pull-to-stand/ sit technique in STEDY) Transfer via Lift Equipment: Probation officer Ambulation: No Gait Gait: No Stairs / Additional Locomotion Stairs: No Wheelchair Mobility Wheelchair Mobility: No  PT Evaluation completed; see above for results. PT educated patient in roles of PT vs OT, PT POC, rehab potential, rehab goals, and discharge recommendations along  with recommendation for follow-up rehabilitation services. Individual treatment initiated:  Patient supine in bed lying to R side upon PT arrival. Patient alert and agreeable to PT session. RN in room per pt request for pain medication. Pt complaining of L sided LBP at 7-8/ 10 at start of session. Pt relates pain is chronic d/t hx of bulging lumbar discs.   Therapeutic Activity: Bed Mobility: D/t pain, pt requires time to initiate movement but is willing to participate despite fatigue from previous sessions. Patient performed supine to/from sit from sidelying position on R and is able to bring BLE to EOB but requires ModA to push UB to upright seated position. Provided verbal cues for effort. Return to supine requires ModA for BLE to reach bed surface with reduced spinal torque/ bend Transfers: Patient performed sit <> stand with use of STEDY in order to improve positioning on bed. Pt requires elevated bed surface and Mod/MaxA for power up and maintaining effort into upright stance. Requires pull-to-stand technique. Provided vc/tc for effort.  Therapeutic Exercise: Patient performed seated hip flex/ ext, abd/add, knee flex/ ext and ankle DF/ PF to tolerance with verbal and tactile cues for proper technique. Pt demos weakness on R side attributed to R knee pain which is apparent from significant swelling compared to LLE most likely from arthritic nature.  Patient supine  in bed at end of session with brakes locked, bed alarm set, and all needs within reach.   Discharge Criteria: Patient will be discharged from PT if patient refuses treatment 3 consecutive times without medical reason, if treatment goals not met, if there is a change in medical status, if patient makes no progress towards goals or if patient is discharged from hospital.  The above assessment, treatment plan, treatment alternatives and goals were discussed and mutually agreed upon: by patient  Alger Simons 06/21/2021, 5:33 PM

## 2021-06-21 NOTE — Progress Notes (Signed)
Patient ID: Stephania Fragmin, female   DOB: May 13, 1961, 61 y.o.   MRN: [id number]  SW made efforts to meet with pt to complete assessment, but pt in therapy. SW will continue to make efforts to complete assessment, and will assess for d/c needs.   [name], MSW, [place] Office: [phone] Cell: [phone] Fax: [phone]

## 2021-06-21 NOTE — Progress Notes (Addendum)
PROGRESS NOTE   Subjective/Complaints: Reports nausea (ongoing). Right shoulder feels better this morning. Has bilateral knee pain. Otherwise slept well  ROS: Patient denies fever, rash, sore throat, blurred vision, nausea, vomiting, diarrhea, cough, shortness of breath or chest pain,   headache, or mood change.    Objective:   [place] EKG SITE RITE  Result Date: 06/20/2021 If Site Rite image not attached, placement could not be confirmed due to current cardiac rhythm.  Recent Labs    06/19/21 0622 06/21/21 0516  WBC  --  10.8*  HGB 13.0 11.9*  HCT 38.4 36.2  PLT  --  387   Recent Labs    06/19/21 0622 06/21/21 0516  NA 136 136  K 3.8 3.9  CL 104 103  CO2 22 25  GLUCOSE 116* 130*  BUN <5* 5*  CREATININE 0.64 0.74  CALCIUM 8.4* 8.3*    Intake/Output Summary (Last 24 hours) at 06/21/2021 1007 Last data filed at 06/21/2021 0038 Gross per 24 hour  Intake 220 ml  Output --  Net 220 ml     Pressure Injury 06/12/21 Sacrum Left;Upper Deep Tissue Pressure Injury - Purple or maroon localized area of discolored intact skin or blood-filled blister due to damage of underlying soft tissue from pressure and/or shear. (Active)  06/12/21 1644  Location: Sacrum  Location Orientation: Left;Upper  Staging: Deep Tissue Pressure Injury - Purple or maroon localized area of discolored intact skin or blood-filled blister due to damage of underlying soft tissue from pressure and/or shear.  Wound Description (Comments):   Present on Admission: No     Pressure Injury 06/12/21 Sacrum Mid Stage 1 -  Intact skin with non-blanchable redness of a localized area usually over a bony prominence. (Active)  06/12/21 2000  Location: Sacrum  Location Orientation: Mid  Staging: Stage 1 -  Intact skin with non-blanchable redness of a localized area usually over a bony prominence.  Wound Description (Comments):   Present on Admission: Yes     Physical Exam: Vital Signs Blood pressure 122/67, pulse 73, temperature 98.2 F (36.8 C), temperature source Oral, resp. rate 18, height 5\' 6"  (1.676 m), weight 119.7 kg, last menstrual period 11/02/2013, SpO2 97 %.  General: Alert and oriented x 3, No apparent distress. obese HEENT: Head is normocephalic, atraumatic, PERRLA, EOMI, sclera anicteric, oral mucosa pink and moist, dentition intact, ext ear canals clear,  Neck: Supple without JVD or lymphadenopathy Heart: Reg rate and rhythm. No murmurs rubs or gallops Chest: CTA bilaterally without wheezes, rales, or rhonchi; no distress Abdomen: Soft, non-tender, non-distended, bowel sounds positive. Extremities: No clubbing, cyanosis, or edema. Pulses are 2+ Psych: Pt's affect is appropriate. Pt is cooperative Skin: small sacral wound. Crusting in scalp Neuro:  Alert and oriented x 3. Fair insight and awareness. Intact Memory. Normal language and speech. Cranial nerve exam unremarkable. UE  strength 5/5. LLE 3-4/5 prox to 4/5 distally. RLE 1-2/5 prox (pain) to 3/5 distally. No focal sensory findings Musculoskeletal: bilateral knee pain with ROM, mild effusions R>L.     Assessment/Plan: 1. Functional deficits which require 3+ hours per day of interdisciplinary therapy in a comprehensive inpatient rehab setting. Physiatrist is providing close team  supervision and 24 hour management of active medical problems listed below. Physiatrist and rehab team continue to assess barriers to discharge/monitor patient progress toward functional and medical goals  Care Tool:  Bathing    Body parts bathed by patient: Buttocks, Front perineal area, Right upper leg, Right lower leg, Left upper leg, Left lower leg   Body parts bathed by helper: Left lower leg, Right lower leg, Left upper leg, Right upper leg, Buttocks, Front perineal area     Bathing assist Assist Level: Total Assistance - Patient < 25%     Upper Body Dressing/Undressing Upper body  dressing        Upper body assist      Lower Body Dressing/Undressing Lower body dressing            Lower body assist       Toileting Toileting    Toileting assist Assist for toileting: Total Assistance - Patient < 25%     Transfers Chair/bed transfer  Transfers assist           Locomotion Ambulation   Ambulation assist              Walk 10 feet activity   Assist           Walk 50 feet activity   Assist           Walk 150 feet activity   Assist           Walk 10 feet on uneven surface  activity   Assist           Wheelchair     Assist               Wheelchair 50 feet with 2 turns activity    Assist            Wheelchair 150 feet activity     Assist          Blood pressure 122/67, pulse 73, temperature 98.2 F (36.8 C), temperature source Oral, resp. rate 18, height 5\' 6"  (1.676 m), weight 119.7 kg, last menstrual period 11/02/2013, SpO2 97 %.  Medical Problem List and Plan: 1. Functional deficits secondary to H influenza bacteremia and meningitis in the setting of CSF leak/bilateral temporal bone abnormality             -patient may shower             -ELOS/Goals: 20 days MinA             Patient is beginning CIR therapies today including PT and OT  2.  Antithrombotics: -DVT/anticoagulation:  Pharmaceutical: Lovenox             -antiplatelet therapy: Aspirin 81 mg daily 3. Bilateral knee pain R>L. 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  -Ice to bilateral knees 15 minutes three times per day 4. Mood: Wellbutrin 300 mg daily, Zoloft 100 mg daily, Xanax 0.25 mg twice daily as needed  -mood seems appropriate. Husband is supportive             -antipsychotic agents: N/A 5. Neuropsych: This patient is capable of making decisions on her own behalf. 6. Skin/Wound Care: Routine skin checks 7. Fluids/Electrolytes/Nutrition: encourage  appropriate po intake  -protein supp for low albumin 8.  ID.  IV ceftriaxone 2 g every 12 hours as well as Flagyl 500 mg 3 times daily through 06/25/2021.   -Patient will need repeat MRI  prior to completion of antibiotics per Dr. Drucilla Schmidt. -continue protonix and prn zofran for nausea -stools fairly formed until this morning 9.  Hypertension.   continue Lisinopril 5 mg daily.     1/13 well controlled 10.  Diabetes mellitus.  Hemoglobin A1c 6.6.  SSI.   -Patient on Lantus insulin 10 units nightly PRIOR TO ADMISSION as well as Victoza 1.2 mg dose daily.    CBG (last 3)  Recent Labs    06/20/21 1644 06/20/21 2118 06/21/21 0529  GLUCAP 107* 157* 133*   1/13 reasonable control currently 11.  Tobacco abuse.  NicoDerm patch.  Provide counseling 12.  Right shoulder pain.  MRI right shoulder mild distal supraspinatus and infraspinatus tendinosis.  Focal high-grade partial width tearing of the far anterior fibers of the supraspinatus tendon.  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   -pain is improved 1/13 -Conservative care, rom, strengthening with therapy - continue Voltaren gel 13.  Obesity.  BMI 43.95.  Dietary follow-up. Caregiver training on ergonomic assistance techniques as she will likely continue to require some assistance upon discharge 14. Mild transaminitis  -LFT's remain slightly elevated but not too different from 1/5  -follow up next week    LOS: 1 days A FACE TO [place] 06/21/2021, 10:07 AM

## 2021-06-21 NOTE — IPOC Note (Signed)
Overall Plan of Care [facility]) Patient Details Name: Lisa [name] MRN: [id number] DOB: May 29, 1961  Admitting Diagnosis: Acute bacterial meningitis  Hospital Problems: Principal Problem:   Acute bacterial meningitis     Functional Problem List: Nursing Bladder, Bowel, Edema, Endurance, Medication Management, Motor, Pain, Perception, Safety, Skin Integrity  PT Balance, Edema, Endurance, Motor, Pain, Perception, Safety, Sensory, Skin Integrity  OT Balance, Edema, Endurance, Motor, Pain  SLP    TR         Basic ADLs: OT Bathing, Dressing, Toileting     Advanced  ADLs: OT       Transfers: PT Bed Mobility, Bed to Chair, Car, Floor  OT Toilet, Tub/Shower     Locomotion: PT Ambulation, Stairs     Additional Impairments: OT None  SLP Swallowing      TR      Anticipated Outcomes Item Anticipated Outcome  Self Feeding independent  Swallowing  mod I   Basic self-care  supervision  Toileting  supervision   Bathroom Transfers supervision  Bowel/Bladder  min assist  Transfers  CGA  Locomotion  CGA  Communication  N/A  Cognition  N/A  Pain  < 3  Safety/Judgment  min assist and no falls   Therapy Plan: PT Intensity: Minimum of 1-2 x/day ,45 to 90 minutes PT Frequency: 5 out of 7 days PT Duration Estimated Length of Stay: 20 days OT Intensity: Minimum of 1-2 x/day, 45 to 90 minutes OT Frequency: 5 out of 7 days OT Duration/Estimated Length of Stay: 20-21 days SLP Intensity: Minumum of 1-2 x/day, 30 to 90 minutes SLP Frequency: 3 to 5 out of 7 days SLP Duration/Estimated Length of Stay: 20 days (likely much shorter for speech)   Due to the current state of emergency, patients may not be receiving their 3-hours of Medicare-mandated therapy.   Team Interventions: Nursing Interventions Patient/Family Education, Bladder Management, Bowel Management, Disease Management/Prevention, Pain Management, Medication Management, Skin Care/Wound Management, Discharge  Planning, Psychosocial Support  PT interventions Ambulation/gait training, Community reintegration, DME/adaptive equipment instruction, Neuromuscular re-education, Psychosocial support, Stair training, UE/LE Strength taining/ROM, Wheelchair propulsion/positioning, UE/LE Coordination activities, Therapeutic Activities, Skin care/wound management, Functional electrical stimulation, Discharge planning, Balance/vestibular training, Pain management, Cognitive remediation/compensation, Disease management/prevention, Functional mobility training, Patient/family education, Therapeutic Exercise, Visual/perceptual remediation/compensation  OT Interventions Balance/vestibular training, Discharge planning, DME/adaptive equipment instruction, Functional mobility training, Pain management, Patient/family education, Psychosocial support, Self Care/advanced ADL retraining, UE/LE Strength taining/ROM, Therapeutic Exercise, Therapeutic Activities, UE/LE Coordination activities  SLP Interventions Dysphagia/aspiration precaution training, Patient/family education  TR Interventions    SW/CM Interventions     Barriers to Discharge MD  Medical stability  Nursing Decreased caregiver support, Home environment access/layout, Incontinence, Neurogenic Bowel & Bladder, Wound Care, Lack of/limited family support, Weight, Weight bearing restrictions, Medication compliance Lives in 2 level home, 1 step, no rails. Able to live on main level with bedroom/bathroom. Spouse and daughter can provide 24/7 care at discharge.  PT Inaccessible home environment, Decreased caregiver support, Home environment access/layout, Wound Care, Insurance for SNF coverage, Lack of/limited family support, Weight, Medication compliance, Pending surgery, Behavior, Nutrition means, Other (comments)    OT      SLP      SW       Team Discharge Planning: Destination: PT-Home ,OT- Home , SLP-Home Projected Follow-up: PT-Home health PT, OT-  Home health OT,  SLP-None Projected Equipment Needs: PT- , OT- Tub/shower bench, SLP-None recommended by SLP Equipment Details: PT- , OT-  Patient/family involved in discharge planning: PT- Patient,  OT-Patient, Family member/caregiver, SLP-Patient  MD ELOS: 20-21 days Medical Rehab Prognosis:  Excellent Assessment: The patient has been admitted for CIR therapies with the diagnosis of bacterial meningitis, multiple medical. The team will be addressing functional mobility, strength, stamina, balance, safety, adaptive techniques and equipment, self-care, bowel and bladder mgt, patient and caregiver education, NMR, cognition, pain mgt, community reentry. Goals have been set at supervision to min assist/CGA with mobility, self-care.   Due to the current state of emergency, patients may not be receiving their 3 hours per day of Medicare-mandated therapy.    Meredith Staggers, MD, FAAPMR     See Team Conference Notes for weekly updates to the plan of care

## 2021-06-21 NOTE — Evaluation (Signed)
Occupational Therapy Assessment and Plan  Patient Details  Name: Lisa [name] MRN: [id number] Date of Birth: [birth year]  OT Diagnosis: acute pain and muscle weakness (generalized) Rehab Potential: Rehab Potential (ACUTE ONLY): Good ELOS: 20-21 days   Today's Date: 06/21/2021 OT Individual Time: 7672-0947 OT Individual Time Calculation (min): 75 min     Hospital Problem: Principal Problem:   Acute bacterial meningitis   Past Medical History:  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    Polycystic ovarian disease    Past Surgical History:  Past Surgical History:  Procedure Laterality Date   csf leak in skull  2005   noted failed attempt to close CSF leak in skull   DILATION AND EVACUATION  1990   miscarriage   IR FLUORO GUIDED NEEDLE PLC ASPIRATION/INJECTION LOC  06/12/2021   RADIOLOGY WITH ANESTHESIA N/A 06/12/2021   Procedure: IR WITH ANESTHESIA;  Surgeon: Radiologist, Medication, MD;  Location: [place];  Service: Radiology;  Laterality: N/A;   TONSILLECTOMY AND ADENOIDECTOMY  1973    Assessment & Plan Clinical Impression:   Lisa [name] is a 61 year old right-handed female with history of diabetes mellitus hypertension, hyperlipidemia and tobacco use as well as 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  Per chart review patient lives with spouse.  1 level home one-step to entry.  Independent prior to admission with occasional cane.  Presented 06/11/2021 with altered mental status and fever.  She was placed on broad-spectrum antibiotics.  Cranial CT scan showed no acute intracranial abnormality.  Complete opacification of both mastoid  air cells.  MRI of the brain diffuse leptomeningeal enhancement involving both cerebral hemispheres, highly suggestive of acute meningitis.  Patient did require intubation for airway protection and extubated 06/14/2021.  Lumbar puncture with 6004-25 white blood cells 84% neutrophils and abundant white blood cells but no organisms initially seen, protein 283 glucose 32.  Infectious disease consulted as well as neurology for H influenza bacteremia and meningitis in the setting of CSF leak.  Currently completing a 14-day course of IV ceftriaxone 2 g every 12 hours as well as IV/p.o. Flagyl 500 mg 3 times daily ending 06/25/2021.  Hospital course she did initially require Precedex for agitation.  Recommendations are repeat MRI of the brain before completion of antibiotic course per Dr. Drucilla Schmidt.  Lovenox initiated for DVT prophylaxis.  Currently on mechanical soft diet.  Therapy evaluations completed due to patient decreased functional ability altered mental status was admitted for a comprehensive rehab program. She complaints of right knee pain. Patient transferred to CIR on 06/20/2021 .    Patient currently requires max with basic self-care skills secondary to muscle weakness and muscle joint tightness, decreased cardiorespiratoy endurance, and decreased sitting balance, decreased standing balance, decreased postural control, and decreased balance strategies.  Prior to hospitalization, patient could complete BADLs with modified independent .  Patient will benefit from skilled intervention to increase independence with basic self-care skills prior to discharge home with care partner.  Anticipate patient will require 24 hour supervision and follow up home health.  OT - End  of Session Activity Tolerance: Tolerates < 10 min activity, no significant change in vital signs Endurance Deficit: Yes Endurance Deficit Description: limited by pain and weakness OT Assessment Rehab Potential (ACUTE ONLY): Good OT Patient  demonstrates impairments in the following area(s): Balance;Edema;Endurance;Motor;Pain OT Basic ADL's Functional Problem(s): Bathing;Dressing;Toileting OT Transfers Functional Problem(s): Toilet;Tub/Shower OT Additional Impairment(s): None OT Plan OT Intensity: Minimum of 1-2 x/day, 45 to 90 minutes OT Frequency: 5 out of 7 days OT Duration/Estimated Length of Stay: 20-21 days OT Treatment/Interventions: Balance/vestibular training;Discharge planning;DME/adaptive equipment instruction;Functional mobility training;Pain management;Patient/family education;Psychosocial support;Self Care/advanced ADL retraining;UE/LE Strength taining/ROM;Therapeutic Exercise;Therapeutic Activities;UE/LE Coordination activities OT Self Feeding Anticipated Outcome(s): independent OT Basic Self-Care Anticipated Outcome(s): supervision OT Toileting Anticipated Outcome(s): supervision OT Bathroom Transfers Anticipated Outcome(s): supervision OT Recommendation Patient destination: Home Follow Up Recommendations: Home health OT Equipment Recommended: Tub/shower bench   OT Evaluation Precautions/Restrictions  Precautions Precautions: Fall Precaution Comments: hx of back and knee arthrtis, per pt can not lift more than 10 lbs due to prior Restrictions Weight Bearing Restrictions: No   Pain Pain Assessment Pain Scale: 0-10 Pain Score: 8  Pain Type: Chronic pain Pain Location: Back Pain Descriptors / Indicators: Aching;Constant Pain Onset: On-going Pain Intervention(s): Repositioned;Rest Home Living/Prior Functioning Home Living Family/patient expects to be discharged to:: Private residence Living Arrangements: Spouse/significant other Available Help at Discharge: Available PRN/intermittently Type of Home: House Home Access: Stairs to enter Technical brewer of Steps: 1 Home Layout: Two level, Able to live on main level with bedroom/bathroom Bathroom Shower/Tub: Chiropodist:  Standard  Lives With: Spouse, Daughter Prior Function Level of Independence: Independent with basic ADLs, Independent with gait (limited cooking in sitting)  Able to Take Stairs?: Yes (could do 1 front step) Driving: Yes (limited) Vocation: Retired Surveyor, mining Baseline Vision/History: 1 Wears glasses Ability to See in Adequate Light: 0 Adequate Patient Visual Report: No change from baseline Vision Assessment?: No apparent visual deficits Perception  Perception: Within Functional Limits Praxis Praxis: Intact Cognition Overall Cognitive Status: Within Functional Limits for tasks assessed Arousal/Alertness: Awake/alert Orientation Level: Person;Place;Situation Person: Oriented Place: Oriented Situation: Oriented Year: 2023 Month: January Day of Week: Correct Memory: Appears intact Immediate Memory Recall: Sock;Blue;Bed Memory Recall Sock: Without Cue Memory Recall Blue: Without Cue Memory Recall Bed: Without Cue Awareness: Appears intact Problem Solving: Appears intact Safety/Judgment: Appears intact Sensation Sensation Light Touch: Appears Intact Hot/Cold: Appears Intact Proprioception: Appears Intact Stereognosis: Appears Intact Coordination Gross Motor Movements are Fluid and Coordinated: No Fine Motor Movements are Fluid and Coordinated: Yes Coordination and Movement Description: significant weakness Motor  Motor Motor: Other (comment) Motor - Skilled Clinical Observations: significant weakness  Trunk/Postural Assessment  Thoracic Assessment Thoracic Assessment: Exceptions to [facility] (rounded shoulders) Postural Control Postural Control: Deficits on evaluation Trunk Control: has difficulty with full trunk extension in standing due to back pain  Balance Static Sitting Balance Static Sitting - Level of Assistance: 7: Independent Dynamic Sitting Balance Dynamic Sitting - Level of Assistance: 5: Stand by assistance Static Standing Balance Static Standing - Level of  Assistance: 1: +1 Total assist (with help from stedy) Dynamic Standing Balance Dynamic Standing - Level of Assistance: Not tested (comment) (unable to perform) Extremity/Trunk Assessment RUE Assessment General Strength Comments: 4-/5 (has RTC injury but does not severely limit) LUE Assessment General Strength Comments: 4-5  Care Tool Care Tool Self Care Eating   Eating Assist Level: Set up assist    Oral Care    Oral Care Assist Level: Set up assist    Bathing  Body parts bathed by patient: Right arm;Left arm;Chest;Abdomen;Right upper leg;Left upper leg;Face Body parts bathed by helper: Front perineal area;Buttocks;Left lower leg;Right lower leg   Assist Level: Moderate Assistance - Patient 50 - 74%    Upper Body Dressing(including orthotics)   What is the patient wearing?: Pull over shirt   Assist Level: Set up assist    Lower Body Dressing (excluding footwear)   What is the patient wearing?: Incontinence brief;Pants Assist for lower body dressing: Maximal Assistance - Patient 25 - 49%    Putting on/Taking off footwear   What is the patient wearing?: Non-skid slipper socks;Ted hose Assist for footwear: Dependent - Patient 0%       Care Tool Toileting Toileting activity   Assist for toileting: Total Assistance - Patient < 25%     Care Tool Bed Mobility Roll left and right activity   Roll left and right assist level: Minimal Assistance - Patient > 75%    Sit to lying activity   Sit to lying assist level: Moderate Assistance - Patient 50 - 74%    Lying to sitting on side of bed activity   Lying to sitting on side of bed assist level: the ability to move from lying on the back to sitting on the side of the bed with no back support.: Moderate Assistance - Patient 50 - 74%     Care Tool Transfers Sit to stand transfer   Sit to stand assist level: 2 Helpers    Chair/bed transfer   Chair/bed transfer assist level: Dependent - mechanical lift     Toilet transfer    Assist Level: Dependent - Patient 0% (stedy lift)     Care Tool Cognition  Expression of Ideas and Wants Expression of Ideas and Wants: 4. Without difficulty (complex and basic) - expresses complex messages without difficulty and with speech that is clear and easy to understand  Understanding Verbal and Non-Verbal Content Understanding Verbal and Non-Verbal Content: 4. Understands (complex and basic) - clear comprehension without cues or repetitions   Memory/Recall Ability Memory/Recall Ability : Current season;Location of own room;Staff names and faces;That he or she is in a hospital/hospital unit   Refer to Care Plan for [place] [facility] OT Short Term Goal 1 (Week 1): Pt will be able to rise to stand with RW with mod A of 1 in prep for toileting. OT Short Term Goal 2 (Week 1): Pt will be able to stand pivot to [facility] with RW with mod A of 1. OT Short Term Goal 3 (Week 1): Pt will don pants over feet with use of a reacher to avoid back pain. OT Short Term Goal 4 (Week 1): Pt will be able to wash feet with long handled sponge.  Recommendations for other services: None    Skilled Therapeutic Intervention ADL ADL Eating: Set up Grooming: Setup Upper Body Bathing: Setup Lower Body Bathing: Maximal assistance Upper Body Dressing: Setup Lower Body Dressing: Maximal assistance Toileting: Dependent Where Assessed-Toileting: Bedside Commode Toilet Transfer: Maximal assistance;Other (comment) (wit stedy) Science writer: Other (comment);Bedside commode (stedy) Mobility    Pt seen for initial evaluation and ADL treatment with a focus on activity tolerance and functional mobility. Discussed role of OT, goals,LOS with both pt and spouse.  Pt has significant pain and weakness limiting her but she did put in good effort today. Spouse was very helpful. Pt able to complete bed mobility in and out of bed, stedy transfers to [facility],  toileting, b/d all with Max A overall.   Pt resting in bed with all needs met. Alarm set.   Discharge Criteria: Patient will be discharged from OT if patient refuses treatment 3 consecutive times without medical reason, if treatment goals not met, if there is a change in medical status, if patient makes no progress towards goals or if patient is discharged from hospital.  The above assessment, treatment plan, treatment alternatives and goals were discussed and mutually agreed upon: by patient and by family  Tyliek Timberman 06/21/2021, 11:52 AM

## 2021-06-21 NOTE — Evaluation (Signed)
Speech Language Pathology Assessment and Plan  Patient Details  Name: Lisa [name] MRN: [id number] Date of Birth: April 04, 1961  SLP Diagnosis: Dysphagia  Rehab Potential: Good ELOS: 20 days (likely much shorter for speech)    Today's Date: 06/21/2021 SLP Individual Time: 1105-1150 SLP Individual Time Calculation (min): 45 min   Hospital Problem: Principal Problem:   Acute bacterial meningitis  Past Medical History:  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    Polycystic ovarian disease    Past Surgical History:  Past Surgical History:  Procedure Laterality Date   csf leak in skull  2005   noted failed attempt to close CSF leak in skull   DILATION AND EVACUATION  1990   miscarriage   IR FLUORO GUIDED NEEDLE PLC ASPIRATION/INJECTION LOC  06/12/2021   RADIOLOGY WITH ANESTHESIA N/A 06/12/2021   Procedure: IR WITH ANESTHESIA;  Surgeon: Radiologist, Medication, MD;  Location: [facility];  Service: Radiology;  Laterality: N/A;   TONSILLECTOMY AND ADENOIDECTOMY  1973    Assessment / Plan / Recommendation  Lisa [name] is a 61 year old right-handed female with history of diabetes mellitus hypertension, hyperlipidemia and tobacco use as well as 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  Per chart review patient lives with spouse.  1 level home one-step to entry.  Independent prior to admission with occasional cane.  Presented 06/11/2021 with altered mental status and fever.  She was placed on broad-spectrum antibiotics.  Cranial CT scan showed no acute intracranial abnormality.  Complete opacification of both mastoid air cells.  MRI of the brain  diffuse leptomeningeal enhancement involving both cerebral hemispheres, highly suggestive of acute meningitis.  Patient did require intubation for airway protection and extubated 06/14/2021.  Lumbar puncture with 6004-25 white blood cells 84% neutrophils and abundant white blood cells but no organisms initially seen, protein 283 glucose 32.  Infectious disease consulted as well as neurology for H influenza bacteremia and meningitis in the setting of CSF leak.  Currently completing a 14-day course of IV ceftriaxone 2 g every 12 hours as well as IV/p.o. Flagyl 500 mg 3 times daily ending 06/25/2021.  Hospital course she did initially require Precedex for agitation.  Recommendations are repeat MRI of the brain before completion of antibiotic course per Dr. Drucilla Schmidt.  Lovenox initiated for DVT prophylaxis.  Currently on mechanical soft diet.  Therapy evaluations completed due to patient decreased functional ability altered mental status was admitted for a comprehensive rehab program. She complaints of right knee pain. Patient transferred to CIR on 06/20/2021 .    Clinical Impression Patient presents with mild, suspected to be primarily pharyngeal phase dysphagia with impact from deconditioning. Cognitive function informally assessed and judged to be [facility]. Patient reported that she has been fearful of choking and so she has been very cautious of what solid foods she will eat. She attributes morning coughing to phlegm production from being a long term smoker. She also reported that her taste sensation for many foods has declined leading to decreased overall appetite. Patient did also mention that when she was 61 years old she had "huge tonsils" which led to difficulty swallowing and she has had incidents of "choking" prior to this admission. (she described choking on a salad). She does admit that she does not drink much water. Her voice was clear and strong. She did exhibit a mildly delayed initiation of swallow with straw sips  of thin liquids but patient reported she does this consciously as she is very careful with PO intake. She exhibited a mild instance of throat clearing after initial sips of liquids but otherwise appeared to tolerate without significant difficulty. Through discussion with SLP, decision was made to upgrade solids to regular textures as patient is able to make her own safe decisions of what foods she can and cannot tolerate. SLP plans to see patient for skilled ST for brief period to ensure toleration of PO diet and determine if any benefit from objective swallow study.  Skilled Therapeutic Interventions          BSE  SLP Assessment  Patient will need skilled Speech Lanaguage Pathology Services during CIR admission    Recommendations  SLP Diet Recommendations: Age appropriate regular solids;Thin Liquid Administration via: Straw;Cup Medication Administration: Whole meds with puree Supervision: Patient able to self feed;Intermittent supervision to cue for compensatory strategies Compensations: Slow rate;Small sips/bites Postural Changes and/or Swallow Maneuvers: Seated upright 90 degrees Oral Care Recommendations: Oral care BID Patient destination: Home Follow up Recommendations: None Equipment Recommended: None recommended by SLP    SLP Frequency 3 to 5 out of 7 days   SLP Duration  SLP Intensity  SLP Treatment/Interventions 20 days (likely much shorter for speech)  Minumum of 1-2 x/day, 30 to 90 minutes  Dysphagia/aspiration precaution training;Patient/family education    Pain Pain Assessment Pain Scale: 0-10 Pain Score: 0-No pain Pain Type: Chronic pain Pain Location: Back Pain Orientation: Left Pain Descriptors / Indicators: Sharp;Discomfort Pain Frequency: Constant Pain Onset: On-going Pain Intervention(s): Medication (See eMAR);Repositioned  Prior Functioning Cognitive/Linguistic Baseline: Within functional limits Type of Home: House  Lives With: Spouse;Daughter Available  Help at Discharge: Available PRN/intermittently Vocation: Retired (retired Therapist, sports)  SLP Evaluation Cognition Overall Cognitive Status: Within Functional Limits for tasks assessed Arousal/Alertness: Awake/alert  Comprehension Auditory Comprehension Overall Auditory Comprehension: Appears within functional limits for tasks assessed Expression Expression Primary Mode of Expression: Verbal Verbal Expression Overall Verbal Expression: Appears within functional limits for tasks assessed Oral Motor Oral Motor/Sensory Function Overall Oral Motor/Sensory Function: Generalized oral weakness Motor Speech Overall Motor Speech: Appears within functional limits for tasks assessed  Care Tool Care Tool Cognition Ability to hear (with hearing aid or hearing appliances if normally used Ability to hear (with hearing aid or hearing appliances if normally used): 0. Adequate - no difficulty in normal conservation, social interaction, listening to TV   Expression of Ideas and Wants Expression of Ideas and Wants: 4. Without difficulty (complex and basic) - expresses complex messages without difficulty and with speech that is clear and easy to understand   Understanding Verbal and Non-Verbal Content Understanding Verbal and Non-Verbal Content: 4. Understands (complex and basic) - clear comprehension without cues or repetitions  Memory/Recall Ability Memory/Recall Ability : Current season;Location of own room;Staff names and faces;That he or she is in a hospital/hospital unit    Bedside Swallowing Assessment General Date of Onset: 06/21/21 Previous Swallow Assessment: see H&P Diet Prior to this Study: Dysphagia 3 (soft);Thin liquids Temperature Spikes Noted: No Respiratory Status: Room air History of Recent Intubation: Yes Length of Intubations (days): 2 days Behavior/Cognition: Alert;Cooperative;Pleasant mood Oral  Cavity - Dentition: Adequate natural dentition Self-Feeding Abilities: Able to feed  self Patient Positioning: Upright in bed Baseline Vocal Quality: Normal Volitional Cough: Strong Volitional Swallow: Able to elicit  Oral Care Assessment   Ice Chips Ice chips: Not tested Thin Liquid Thin Liquid: Impaired Presentation: Straw;Self Fed Pharyngeal  Phase Impairments: Throat Clearing - Immediate Other Comments: mild amount of throat clearing with initial sip of thin liquids Nectar Thick   Honey Thick   Puree Puree: Not tested Solid Solid: Not tested BSE Assessment Risk for Aspiration Impact on safety and function: Mild aspiration risk Other Related Risk Factors: Deconditioning  Short Term Goals: Week 1: SLP Short Term Goal 1 (Week 1): Patient will tolerate regular solids, thin liquids diet with very minimal overt s/s aspiration or penetration with mod I. SLP Short Term Goal 2 (Week 1): SLP will determine if any benefit from objective swallow assessment prior to discharge from [place].  Refer to Care Plan for Long Term Goals  Recommendations for other services: None   Discharge Criteria: Patient will be discharged from SLP if patient refuses treatment 3 consecutive times without medical reason, if treatment goals not met, if there is a change in medical status, if patient makes no progress towards goals or if patient is discharged from hospital.  The above assessment, treatment plan, treatment alternatives and goals were discussed and mutually agreed upon: by patient  Sonia Baller, MA, CCC-SLP Speech Therapy

## 2021-06-22 DIAGNOSIS — G009 Bacterial meningitis, unspecified: Secondary | ICD-10-CM | POA: Diagnosis not present

## 2021-06-22 LAB — GLUCOSE, CAPILLARY
Glucose-Capillary: 132 mg/dL — ABNORMAL HIGH (ref 70–99)
Glucose-Capillary: 130 mg/dL — ABNORMAL HIGH (ref 70–99)
Glucose-Capillary: 128 mg/dL — ABNORMAL HIGH (ref 70–99)
Glucose-Capillary: 138 mg/dL — ABNORMAL HIGH (ref 70–99)

## 2021-06-22 NOTE — Progress Notes (Incomplete)
Physical Therapy Session Note  Patient Details  Name: Lisa [name] MRN: [id number] Date of Birth: [birth year]  Today's Date: 06/22/2021 PT Individual Time:  -      Short Term Goals: Week 1:  PT Short Term Goal 1 (Week 1): Pt will perform rolling in bed from side to side for pressure relief with overall CGA. PT Short Term Goal 2 (Week 1): Pt will perform supine<>sit with MinA using bed features. PT Short Term Goal 3 (Week 1): Pt will perform sit<>stand to RW with MaxA +1. PT Short Term Goal 4 (Week 1): Pt will initiate gait training. PT Short Term Goal 5 (Week 1): Pt will perform stand pivot transfers with ModA. Week 2:     Skilled Therapeutic Interventions/Progress Updates:      Therapy Documentation Precautions:  Precautions Precautions: Fall Precaution Comments: hx of back and R knee arthrtis, per pt can not lift more than 10 lbs Restrictions Weight Bearing Restrictions: No General:   Vital Signs: Therapy Vitals Temp: 98.3 F (36.8 C) Temp Source: Oral Pulse Rate: 71 Resp: 18 BP: 139/84 Patient Position (if appropriate): Sitting Oxygen Therapy SpO2: 99 % O2 Device: Room Air Pain:   Mobility:   Locomotion :    Trunk/Postural Assessment :    Balance:   Exercises:   Other Treatments:      Therapy/Group: Individual Therapy  Lorie Phenix 06/22/2021, 4:38 PM

## 2021-06-22 NOTE — Progress Notes (Signed)
PROGRESS NOTE   Subjective/Complaints:   ROS: Patient denies chest pain SOB, N/V/D  Objective:   [place] EKG SITE RITE  Result Date: 06/20/2021 If Site Rite image not attached, placement could not be confirmed due to current cardiac rhythm.  Recent Labs    06/21/21 0516  WBC 10.8*  HGB 11.9*  HCT 36.2  PLT 387    Recent Labs    06/21/21 0516  NA 136  K 3.9  CL 103  CO2 25  GLUCOSE 130*  BUN 5*  CREATININE 0.74  CALCIUM 8.3*     Intake/Output Summary (Last 24 hours) at 06/22/2021 0830 Last data filed at 06/22/2021 0734 Gross per 24 hour  Intake 676 ml  Output --  Net 676 ml      Pressure Injury 06/12/21 Sacrum Left;Upper Deep Tissue Pressure Injury - Purple or maroon localized area of discolored intact skin or blood-filled blister due to damage of underlying soft tissue from pressure and/or shear. (Active)  06/12/21 1644  Location: Sacrum  Location Orientation: Left;Upper  Staging: Deep Tissue Pressure Injury - Purple or maroon localized area of discolored intact skin or blood-filled blister due to damage of underlying soft tissue from pressure and/or shear.  Wound Description (Comments):   Present on Admission: No     Pressure Injury 06/12/21 Sacrum Mid Stage 1 -  Intact skin with non-blanchable redness of a localized area usually over a bony prominence. (Active)  06/12/21 2000  Location: Sacrum  Location Orientation: Mid  Staging: Stage 1 -  Intact skin with non-blanchable redness of a localized area usually over a bony prominence.  Wound Description (Comments):   Present on Admission: Yes    Physical Exam: Vital Signs Blood pressure 119/69, pulse 74, temperature 98.6 F (37 C), temperature source Oral, resp. rate 16, height 5\' 6"  (1.676 m), weight 119.7 kg, last menstrual period 11/02/2013, SpO2 92 %.   General: No acute distress Mood and affect are appropriate Heart: Regular rate and rhythm no  rubs murmurs or extra sounds Lungs: Clear to auscultation, breathing unlabored, no rales or wheezes Abdomen: Positive bowel sounds, soft nontender to palpation, nondistended Extremities: No clubbing, cyanosis, or edema Skin: No evidence of breakdown, no evidence of rash   Neuro:  Alert and oriented x 3. Fair insight and awareness. Intact Memory. Normal language and speech. Cranial nerve exam unremarkable. UE  strength 5/5. LLE 3-4/5 prox to 4/5 distally. RLE 1-2/5 prox (pain) to 3/5 distally. No focal sensory findings Musculoskeletal: bilateral knee pain with ROM, mild effusions R>L.     Assessment/Plan: 1. Functional deficits which require 3+ hours per day of interdisciplinary therapy in a comprehensive inpatient rehab setting. Physiatrist is providing close team supervision and 24 hour management of active medical problems listed below. Physiatrist and rehab team continue to assess barriers to discharge/monitor patient progress toward functional and medical goals  Care Tool:  Bathing    Body parts bathed by patient: Right arm, Left arm, Chest, Abdomen, Right upper leg, Left upper leg, Face   Body parts bathed by helper: Front perineal area, Buttocks, Left lower leg, Right lower leg     Bathing assist Assist Level: Moderate Assistance - Patient  50 - 74%     Upper Body Dressing/Undressing Upper body dressing   What is the patient wearing?: Pull over shirt    Upper body assist Assist Level: Set up assist    Lower Body Dressing/Undressing Lower body dressing      What is the patient wearing?: Incontinence brief, Pants     Lower body assist Assist for lower body dressing: Maximal Assistance - Patient 25 - 49%     Toileting Toileting    Toileting assist Assist for toileting: Total Assistance - Patient < 25%     Transfers Chair/bed transfer  Transfers assist     Chair/bed transfer assist level: Dependent - mechanical lift      Locomotion Ambulation   Ambulation assist   Ambulation activity did not occur: Safety/medical concerns          Walk 10 feet activity   Assist  Walk 10 feet activity did not occur: Safety/medical concerns        Walk 50 feet activity   Assist Walk 50 feet with 2 turns activity did not occur: Safety/medical concerns         Walk 150 feet activity   Assist Walk 150 feet activity did not occur: Safety/medical concerns         Walk 10 feet on uneven surface  activity   Assist Walk 10 feet on uneven surfaces activity did not occur: Safety/medical concerns         Wheelchair     Assist Is the patient using a wheelchair?: Yes Type of Wheelchair: Manual Wheelchair activity did not occur: Safety/medical concerns         Wheelchair 50 feet with 2 turns activity    Assist    Wheelchair 50 feet with 2 turns activity did not occur: Safety/medical concerns       Wheelchair 150 feet activity     Assist  Wheelchair 150 feet activity did not occur: Safety/medical concerns       Blood pressure 119/69, pulse 74, temperature 98.6 F (37 C), temperature source Oral, resp. rate 16, height 5\' 6"  (1.676 m), weight 119.7 kg, last menstrual period 11/02/2013, SpO2 92 %.  Medical Problem List and Plan: 1. Functional deficits secondary to H influenza bacteremia and meningitis in the setting of CSF leak/bilateral temporal bone abnormality             -patient may shower             -ELOS/Goals: 20 days MinA             Patient is beginning CIR therapies today including PT and OT  2.  Antithrombotics: -DVT/anticoagulation:  Pharmaceutical: Lovenox             -antiplatelet therapy: Aspirin 81 mg daily 3. Bilateral knee pain R>L. 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  -Ice to bilateral knees 15 minutes three times per day 4. Mood: Wellbutrin 300 mg daily, Zoloft 100 mg daily, Xanax 0.25 mg  twice daily as needed  -mood seems appropriate. Husband is supportive             -antipsychotic agents: N/A 5. Neuropsych: This patient is capable of making decisions on her own behalf. 6. Skin/Wound Care: Routine skin checks 7. Fluids/Electrolytes/Nutrition: encourage appropriate po intake  -protein supp for low albumin 8.  ID.  IV ceftriaxone 2 g every 12 hours as well as Flagyl 500 mg 3 times daily through  06/25/2021.   -Patient will need repeat MRI prior to completion of antibiotics per Dr. Drucilla Schmidt. -continue protonix and prn zofran for nausea -stools fairly formed until this morning 9.  Hypertension.   continue Lisinopril 5 mg daily.     1/13 well controlled 10.  Diabetes mellitus.  Hemoglobin A1c 6.6.  SSI.   -Patient on Lantus insulin 10 units nightly PRIOR TO ADMISSION as well as Victoza 1.2 mg dose daily.    CBG (last 3)  Recent Labs    06/21/21 1605 06/21/21 2106 06/22/21 0613  GLUCAP 169* 159* 132*   1/14 controlled  11.  Tobacco abuse.  NicoDerm patch.  Provide counseling 12.  Right shoulder pain.  MRI right shoulder mild distal supraspinatus and infraspinatus tendinosis.  Focal high-grade partial width tearing of the far anterior fibers of the supraspinatus tendon.  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   -pain is improved 1/13 -Conservative care, rom, strengthening with therapy - continue Voltaren gel 13.  Obesity.  BMI 43.95.  Dietary follow-up. Caregiver training on ergonomic assistance techniques as she will likely continue to require some assistance upon discharge 14. Mild transaminitis  -LFT's remain slightly elevated but not too different from 1/5  -follow up next week    LOS: 2 days A FACE TO FACE EVALUATION WAS PERFORMED  Charlett Blake 06/22/2021, 8:30 AM

## 2021-06-22 NOTE — Progress Notes (Signed)
Speech Language Pathology Daily Session Note  Patient Details  Name: Lisa [name] MRN: [id number] Date of Birth: 1961-03-05  Today's Date: 06/22/2021 SLP Individual Time: 1210-1240 SLP Individual Time Calculation (min): 30 min  Short Term Goals: Week 1: SLP Short Term Goal 1 (Week 1): Patient will tolerate regular solids, thin liquids diet with very minimal overt s/s aspiration or penetration with mod I. SLP Short Term Goal 2 (Week 1): SLP will determine if any benefit from objective swallow assessment prior to discharge from [place].  Skilled Therapeutic Interventions: Pt seen for skilled ST with focus on swallowing goals, upright in wheelchair for lunch tx. Pt reports no difficulty with regular diet at this time, enjoys being out of bed for meals. Pt consuming regular hamburger and french friends with no overt s/s aspiration, mastication purposeful and mildly extended. Mod I cues to utilize liquid wash to clear min oral stasis after swallows. Pt endorses intermittent "phlegm" in throat which she thinks may be a result of not smoking during hospital stay after a 40+ year history. Pt left in wheelchair with alarm set and all needs within reach. Cont ST POC.   Pain Pain Assessment Pain Scale: 0-10 Pain Score: 0-No pain Pain Type: Chronic pain Pain Location: Back Pain Orientation: Left Pain Descriptors / Indicators: Sharp Pain Frequency: Constant Pain Onset: On-going Patients Stated Pain Goal: 1 Pain Intervention(s): Medication (See eMAR) Multiple Pain Sites: No  Therapy/Group: Individual Therapy  Dewaine Conger 06/22/2021, 12:39 PM

## 2021-06-22 NOTE — Progress Notes (Signed)
Physical Therapy Session Note  Patient Details  Name: Lisa [name] MRN: [id number] Date of Birth: 02-14-1961  Today's Date: 06/22/2021 PT Individual Time: 6945-0388 PT Individual Time Calculation (min): 39 min   Short Term Goals: Week 1:  PT Short Term Goal 1 (Week 1): Pt will perform rolling in bed from side to side for pressure relief with overall CGA. PT Short Term Goal 2 (Week 1): Pt will perform supine<>sit with MinA using bed features. PT Short Term Goal 3 (Week 1): Pt will perform sit<>stand to RW with MaxA +1. PT Short Term Goal 4 (Week 1): Pt will initiate gait training. PT Short Term Goal 5 (Week 1): Pt will perform stand pivot transfers with ModA.  Skilled Therapeutic Interventions/Progress Updates:  Chart reviewed and pt agreeable to therapy. Pt received seated in WC with no c/o pain but reported R knee tends to increase in pain with activity. Session focused on sit to stand transfer to decrease reliance on dependent transfer device and promote eventual RW use. In total, pt completed 10 sit to stand transfers during session. Pt began with needed B rail support + ModA +1 and was able to hold upright standing for 10sec with VC for posture and MinA for support. PT educated pt on standing technique to decrease muscle effort. After 4x sit to stand, pt was able to come to standing with MinA + B hand rails and hold for 30sec. Pt was then educated on need to practice hand placement on chair rail for safe RW use. Pt completed sit to stand with R hand rail + L chair rail (to avoid increase knee pain) + ModA x4 rounds with 30 sec hold with CGA. Pt then discussed technique with PT to decrease pull-to-stand behavior. Pt was able to adjust starting position to complete sit to stand with decrease pull-to-stand for final 2 trials with MinA for transfer support. Pt then completed seated LAQ and ankle pumps 1x10reps. Pt would benefit from continued sit to stand transfer practice to decrease need of  dependent transfer device. At end of session, pt was seated in [facility] with alarm engaged, nurse call bell and all needs in reach.    Therapy Documentation Precautions:  Precautions Precautions: Fall Precaution Comments: hx of back and R knee arthrtis, per pt can not lift more than 10 lbs Restrictions Weight Bearing Restrictions: No  Pain: Pain Assessment Pain Scale: 0-10 Pain Score: 0-No pain   Therapy/Group: Individual Therapy  Marquette Saa, PT, DPT 06/22/2021, 4:51 PM

## 2021-06-22 NOTE — Progress Notes (Signed)
Occupational Therapy Session Note  Patient Details  Name: Lisa [name] MRN: [id number] Date of Birth: 16-Jul-1960  Today's Date: 06/22/2021 OT Individual Time: 6384-5364 OT Individual Time Calculation (min): 60 min    Short Term Goals: Week 1:  OT Short Term Goal 1 (Week 1): Pt will be able to rise to stand with RW with mod A of 1 in prep for toileting. OT Short Term Goal 2 (Week 1): Pt will be able to stand pivot to [facility] with RW with mod A of 1. OT Short Term Goal 3 (Week 1): Pt will don pants over feet with use of a reacher to avoid back pain. OT Short Term Goal 4 (Week 1): Pt will be able to wash feet with long handled sponge.  Skilled Therapeutic Interventions/Progress Updates:    Pt received supine with no c/o pain, on bed pan. She rolled to the R with min A while OT completed peri hygiene- she voided BM and urine. She then came to EOB with mod lifting A. Sitting balance EOB with (S). Stedy used to transfer pt from bed to the w/c, mod lifting assist from EOB. She completed oral hygiene and grooming tasks at the sink with set up assist. Pt agreeable to wash her hair in the sink using hair washing tray. Pt required max A for hair care d/t heavy matting from glue. Worked on her hair for several minutes within pain tolerance. Pt was left sitting up in the w/c with all needs met.   Therapy Documentation Precautions:  Precautions Precautions: Fall Precaution Comments: hx of back and R knee arthrtis, per pt can not lift more than 10 lbs Restrictions Weight Bearing Restrictions: No   Therapy/Group: Individual Therapy  Curtis Sites 06/22/2021, 7:21 AM

## 2021-06-22 NOTE — Progress Notes (Signed)
Physical Therapy Session Note  Patient Details  Name: Lisa [name] MRN: [id number] Date of Birth: Mar 13, 1961  Today's Date: 06/22/2021 PT Individual Time: 1635-1730   24mn  Short Term Goals: Week 1:  PT Short Term Goal 1 (Week 1): Pt will perform rolling in bed from side to side for pressure relief with overall CGA. PT Short Term Goal 2 (Week 1): Pt will perform supine<>sit with MinA using bed features. PT Short Term Goal 3 (Week 1): Pt will perform sit<>stand to RW with MaxA +1. PT Short Term Goal 4 (Week 1): Pt will initiate gait training. PT Short Term Goal 5 (Week 1): Pt will perform stand pivot transfers with ModA. Week 2:     Skilled Therapeutic Interventions/Progress Updates:   Pt received sitting in WC and agreeable to PT. [facility] Pt instructed in therex seated in WC: LAQ x 10 BLE. Hip abduction with level 2 tband(doubled) x 10. Reciprocal Hip flexion x 10 BLE, Ankle PF x 20. Ankle DF x 20.    [facility] to clear gluteal surface from seat x 10 with cues for anterior weight shift and improved use of BLE to press off seat..   Pregait stepping x 4 BLE in parallel bars with tactile cues for terminal knee extension in stance and improved  step width on the RLE to reduce stress from arthritis min assist from PT for safety.  Gait training in parallel bars x 3 steps bil with min assist for safety and tactile cues for terminal extension on BLE in stance.   UBE 2 min forward/2 min reverse with cues for consistent RPM throughout varied resistance.   [facility] and all needs met.        Therapy Documentation Precautions:  Precautions Precautions: Fall Precaution Comments: hx of back and R knee arthrtis, per pt can not lift more than 10 lbs Restrictions Weight Bearing Restrictions: No    Vital Signs: Therapy Vitals Temp: 98.3 F (36.8 C) Temp Source:  Oral Pulse Rate: 71 Resp: 18 BP: 139/84 Patient Position (if appropriate): Sitting Oxygen Therapy SpO2: 99 % O2 Device: Room Air Pain: Pain Assessment Pain Scale: 0-10 Pain Score: 0-No pain     Therapy/Group: Individual Therapy  ALorie Phenix1/14/2023, 4:42 PM

## 2021-06-23 DIAGNOSIS — G009 Bacterial meningitis, unspecified: Secondary | ICD-10-CM | POA: Diagnosis not present

## 2021-06-23 LAB — GLUCOSE, CAPILLARY
Glucose-Capillary: 131 mg/dL — ABNORMAL HIGH (ref 70–99)
Glucose-Capillary: 102 mg/dL — ABNORMAL HIGH (ref 70–99)
Glucose-Capillary: 151 mg/dL — ABNORMAL HIGH (ref 70–99)
Glucose-Capillary: 133 mg/dL — ABNORMAL HIGH (ref 70–99)

## 2021-06-23 NOTE — Progress Notes (Signed)
Speech Language Pathology Discharge Summary  Patient Details  Name: Lisa [name] MRN: [id number] Date of Birth: 03-19-1961  Today's Date: 06/23/2021 SLP Individual Time: 1430-1515 SLP Individual Time Calculation (min): 45 min   Skilled Therapeutic Interventions:  Pt was seen in room for skilled ST follow up regarding swallow function. Pt was observed with regular solid and thin liquids. Pt consumed pack of crackers with no overt s/sx of dysphagia/overt s/sx of aspiration. No overt s/sx of aspiration were noted with thin liquids via straw, consecutive swallows. Pt reported she feels comfortable with current diet of regular/thin. Pt was able to independently recall safe swallow strategies. SLP discussed d/c, pt in agreement.     Patient has met 2 of 2 long term goals.  Patient to discharge at overall Independent level.  Reasons goals not met:     Clinical Impression/Discharge Summary: Patient has made functional gains and has met 2 of 2 long-term goals this admission due to improved swallow function. Patient is currently consuming a regular diet and thin liquids independently with use implementation of safe swallow precautions and strategies. Patient education is complete and patient.There are no additional SLP needs in CIR at this time and follow up SLP services are not indicated. ST to sign off.  Care Partner:        Recommendation:  None      Equipment:     Reasons for discharge: Treatment goals met   Patient/Family Agrees with Progress Made and Goals Achieved: Yes    Verdene Lennert MS, CCC-SLP, CBIS  06/23/2021, 3:02 PM

## 2021-06-23 NOTE — Progress Notes (Signed)
PROGRESS NOTE   Subjective/Complaints:  No issues ovnernite , pt c/o hx of CSF leak and has had lifting restriction of 10lb imposed by other MDs PTA  ROS: Patient denies chest pain SOB, N/V/D  Objective:   No results found. Recent Labs    06/21/21 0516  WBC 10.8*  HGB 11.9*  HCT 36.2  PLT 387    Recent Labs    06/21/21 0516  NA 136  K 3.9  CL 103  CO2 25  GLUCOSE 130*  BUN 5*  CREATININE 0.74  CALCIUM 8.3*     Intake/Output Summary (Last 24 hours) at 06/23/2021 0834 Last data filed at 06/23/2021 1610 Gross per 24 hour  Intake 792.75 ml  Output --  Net 792.75 ml      Pressure Injury 06/12/21 Sacrum Left;Upper Deep Tissue Pressure Injury - Purple or maroon localized area of discolored intact skin or blood-filled blister due to damage of underlying soft tissue from pressure and/or shear. (Active)  06/12/21 1644  Location: Sacrum  Location Orientation: Left;Upper  Staging: Deep Tissue Pressure Injury - Purple or maroon localized area of discolored intact skin or blood-filled blister due to damage of underlying soft tissue from pressure and/or shear.  Wound Description (Comments):   Present on Admission: No     Pressure Injury 06/12/21 Sacrum Mid Stage 1 -  Intact skin with non-blanchable redness of a localized area usually over a bony prominence. (Active)  06/12/21 2000  Location: Sacrum  Location Orientation: Mid  Staging: Stage 1 -  Intact skin with non-blanchable redness of a localized area usually over a bony prominence.  Wound Description (Comments):   Present on Admission: Yes    Physical Exam: Vital Signs Blood pressure 121/73, pulse 73, temperature 98.7 F (37.1 C), temperature source Oral, resp. rate 18, height 5\' 6"  (1.676 m), weight 119.7 kg, last menstrual period 11/02/2013, SpO2 97 %.   General: No acute distress Mood and affect are appropriate Heart: Regular rate and rhythm no rubs  murmurs or extra sounds Lungs: Clear to auscultation, breathing unlabored, no rales or wheezes Abdomen: Positive bowel sounds, soft nontender to palpation, nondistended Extremities: No clubbing, cyanosis, or edema Skin: No evidence of breakdown, no evidence of rash   Neuro:  Alert and oriented x 3. Fair insight and awareness. Intact Memory. Normal language and speech. Cranial nerve exam unremarkable. UE  strength 5/5. LLE 3-4/5 prox to 4/5 distally. RLE 1-2/5 prox (pain) to 3/5 distally. No focal sensory findings Musculoskeletal: no effusion or erythema in knees   Assessment/Plan: 1. Functional deficits which require 3+ hours per day of interdisciplinary therapy in a comprehensive inpatient rehab setting. Physiatrist is providing close team supervision and 24 hour management of active medical problems listed below. Physiatrist and rehab team continue to assess barriers to discharge/monitor patient progress toward functional and medical goals  Care Tool:  Bathing    Body parts bathed by patient: Right arm, Left arm, Chest, Abdomen, Right upper leg, Left upper leg, Face   Body parts bathed by helper: Front perineal area, Buttocks, Left lower leg, Right lower leg     Bathing assist Assist Level: Moderate Assistance - Patient 50 - 74%  Upper Body Dressing/Undressing Upper body dressing   What is the patient wearing?: Pull over shirt    Upper body assist Assist Level: Set up assist    Lower Body Dressing/Undressing Lower body dressing      What is the patient wearing?: Incontinence brief, Pants     Lower body assist Assist for lower body dressing: Maximal Assistance - Patient 25 - 49%     Toileting Toileting    Toileting assist Assist for toileting: Moderate Assistance - Patient 50 - 74%     Transfers Chair/bed transfer  Transfers assist     Chair/bed transfer assist level: Dependent - mechanical lift     Locomotion Ambulation   Ambulation assist    Ambulation activity did not occur: Safety/medical concerns          Walk 10 feet activity   Assist  Walk 10 feet activity did not occur: Safety/medical concerns        Walk 50 feet activity   Assist Walk 50 feet with 2 turns activity did not occur: Safety/medical concerns         Walk 150 feet activity   Assist Walk 150 feet activity did not occur: Safety/medical concerns         Walk 10 feet on uneven surface  activity   Assist Walk 10 feet on uneven surfaces activity did not occur: Safety/medical concerns         Wheelchair     Assist Is the patient using a wheelchair?: Yes Type of Wheelchair: Manual Wheelchair activity did not occur: Safety/medical concerns         Wheelchair 50 feet with 2 turns activity    Assist    Wheelchair 50 feet with 2 turns activity did not occur: Safety/medical concerns       Wheelchair 150 feet activity     Assist  Wheelchair 150 feet activity did not occur: Safety/medical concerns       Blood pressure 121/73, pulse 73, temperature 98.7 F (37.1 C), temperature source Oral, resp. rate 18, height 5\' 6"  (1.676 m), weight 119.7 kg, last menstrual period 11/02/2013, SpO2 97 %.  Medical Problem List and Plan: 1. 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  Antithrombotics: -DVT/anticoagulation:  Pharmaceutical: Lovenox             -antiplatelet therapy: Aspirin 81 mg daily 3. Bilateral knee pain R>L. 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  -Ice to bilateral knees 15 minutes three times per day 4. Mood: Wellbutrin 300 mg daily, Zoloft 100 mg daily, Xanax 0.25 mg twice daily as needed  -mood seems  appropriate. Husband is supportive             -antipsychotic agents: N/A 5. Neuropsych: This patient is capable of making decisions on her own behalf. 6. Skin/Wound Care: Routine skin checks 7. Fluids/Electrolytes/Nutrition: encourage appropriate po intake  -protein supp for low albumin 8.  ID.  IV ceftriaxone 2 g every 12 hours as well as Flagyl 500 mg 3 times daily through 06/25/2021.   -Patient will need  repeat MRI prior to completion of antibiotics per Dr. Drucilla Schmidt. -continue protonix and prn zofran for nausea -stools fairly formed until this morning 9.  Hypertension.   continue Lisinopril 5 mg daily.     1/13 well controlled 10.  Diabetes mellitus.  Hemoglobin A1c 6.6.  SSI.   -Patient on Lantus insulin 10 units nightly PRIOR TO ADMISSION as well as Victoza 1.2 mg dose daily.    CBG (last 3)  Recent Labs    06/22/21 1612 06/22/21 2101 06/23/21 0559  GLUCAP 128* 138* 131*   1/15 controlled  11.  Tobacco abuse.  NicoDerm patch.  Provide counseling 12.  Right shoulder pain.  MRI right shoulder mild distal supraspinatus and infraspinatus tendinosis.  Focal high-grade partial width tearing of the far anterior fibers of the supraspinatus tendon.  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   -pain is improved 1/13 -Conservative care, rom, strengthening with therapy - continue Voltaren gel 13.  Obesity.  BMI 43.95.  Dietary follow-up. Caregiver training on ergonomic assistance techniques as she will likely continue to require some assistance upon discharge 14. Mild transaminitis  -LFT's remain slightly elevated but not too different from 1/5  -follow up next week    LOS: 3 days A FACE TO FACE EVALUATION WAS PERFORMED  Charlett Blake 06/23/2021, 8:34 AM

## 2021-06-24 DIAGNOSIS — M170 Bilateral primary osteoarthritis of knee: Secondary | ICD-10-CM | POA: Diagnosis not present

## 2021-06-24 DIAGNOSIS — E1169 Type 2 diabetes mellitus with other specified complication: Secondary | ICD-10-CM | POA: Diagnosis not present

## 2021-06-24 DIAGNOSIS — I10 Essential (primary) hypertension: Secondary | ICD-10-CM | POA: Diagnosis not present

## 2021-06-24 DIAGNOSIS — G009 Bacterial meningitis, unspecified: Secondary | ICD-10-CM | POA: Diagnosis not present

## 2021-06-24 LAB — GLUCOSE, CAPILLARY
Glucose-Capillary: 149 mg/dL — ABNORMAL HIGH (ref 70–99)
Glucose-Capillary: 110 mg/dL — ABNORMAL HIGH (ref 70–99)
Glucose-Capillary: 167 mg/dL — ABNORMAL HIGH (ref 70–99)
Glucose-Capillary: 144 mg/dL — ABNORMAL HIGH (ref 70–99)

## 2021-06-24 NOTE — Progress Notes (Signed)
Occupational Therapy Session Note ° °Patient Details  °Name: Lisa [name] °MRN: [id number] °Date of Birth: 11/13/1960 ° °Today's Date: 06/24/2021 °Session 1 °OT Individual Time: 0849-1000 °OT Individual Time Calculation (min): 71 min  ° °Session 2 °OT Individual Time: 1306-1416 °OT Individual Time Calculation (min): 70 min  ° ° °Short Term Goals: °Week 1:  OT Short Term Goal 1 (Week 1): Pt will be able to rise to stand with RW with mod A of 1 in prep for toileting. °OT Short Term Goal 2 (Week 1): Pt will be able to stand pivot to BSC with RW with mod A of 1. °OT Short Term Goal 3 (Week 1): Pt will don pants over feet with use of a reacher to avoid back pain. °OT Short Term Goal 4 (Week 1): Pt will be able to wash feet with long handled sponge. ° °Skilled Therapeutic Interventions/Progress Updates:  °Session 1 °  Pt greeted semi-reclined in bed and agreeable to OT treament session. Pt reported feeling stiff from being in bed and requested to go to the bathroom. Pt needed min A for bed mobility with HOB elevated. Dressing tasks completed from EOB with mod A to don pants and min A for UB. Sit<>stand in Stedy with bed raised and mod A. Stedy transfer into bathroom to BSC Over toilet. Pt needed OT assist for clothing management. Continent BM and voided bladder. Worked on trying to assist with peri-care in standing but pt unable to reach and maintain standing. Max A for peri-care. Grooming tasks completed from wc at the sink with set-up A. Discussed PLOF, OT gaols, patient goals, and rehab process with pt. PT able to help some with combing hair, but needed OT assist to help get glue out of hair using small comb. Pt left seated in wc at end of session with alarm belt on, call bell in reach, and needs met.  °Pain reported chronic knee pain, no number stated. Rest and repositioned for comfort. ° °Session 2 °Pt greeted seated in wc and agreeable to OT treatment session. Pt reported she had a great PT session earlier and was  able to ambulate with the bariatric RW. Pt eager to try ambualting into bathroom. Pt able to stand with bariatric RW and mod A , then ambulate into bathroom with Min A overall. Pt needed OT assist to manage clothing, but was able to successful void bladder. Pt able to clean peri-area, but needed OT assist for clothing management again with mod A to stand form BSC over toilet. Pt ambulated back to wc with RW and min A. Pt brought to therapy gym and worked on standing balance/endurance, while removing unilateral UE to simulate clothing management. Pt tolerated standing for 2.5 minutes during BITS activity with RW and min A for balance. UB there-ex with 7 minutes on [employer] arm bike on level 1. Pt returned to room and requested to stay up in wc. Pt left with chair alarm belt on, call bell in reach, and needs met.  ° °Therapy Documentation °Precautions:  °Precautions °Precautions: Fall °Precaution Comments: hx of back and R knee arthrtis, per pt can not lift more than 10 lbs °Restrictions °Weight Bearing Restrictions: No °Pain: °  Back pain. 4, chronic. Rest and repositioned for comfort. ° ° ° °Therapy/Group: Individual Therapy ° ° S  °06/24/2021, 2:24 PM °

## 2021-06-24 NOTE — Progress Notes (Signed)
Physical Therapy Session Note  Patient Details  Name: Lisa [name] MRN: [id number] Date of Birth: 08-02-1960  Today's Date: 06/24/2021 PT Individual Time: 1031-1130 PT Individual Time Calculation (min): 59 min   Short Term Goals: Week 1:  PT Short Term Goal 1 (Week 1): Pt will perform rolling in bed from side to side for pressure relief with overall CGA. PT Short Term Goal 2 (Week 1): Pt will perform supine<>sit with MinA using bed features. PT Short Term Goal 3 (Week 1): Pt will perform sit<>stand to RW with MaxA +1. PT Short Term Goal 4 (Week 1): Pt will initiate gait training. PT Short Term Goal 5 (Week 1): Pt will perform stand pivot transfers with ModA.  Skilled Therapeutic Interventions/Progress Updates:  Patient seated upright in w/c on entrance to room. Patient alert and agreeable to PT session.   Patient with minimal pain complaint throughout session and actually stating that regular pain complaints are less this day.   Therapeutic Activity: Transfers: Patient performed sit<>stand and stand pivot transfers throughout session with MinA improving to mainly CGA and two instances of supervision. Provided verbal cues for technique including foot placement and forward lean.  Gait Training:  Patient ambulated 27' x1/ 25' x1 using bari walker with CGA and guard to R knee. Demonstrated good quad activation with stance to RLE. Provided vc/ tc for upright posture, slow pace, good knee extension with weight shift to stance phase on LE. Pt is very pleased with performance and encouraged for future therapy sessions.   Neuromuscular Re-ed: NMR facilitated during session with focus on improved sit<>stand technique and performance. Pt guided in improving technique with verbal instruction and visual demonstration re: biomechanics of rise to stand with BLE doing bulk of work and use of forward weight shift over feet/ BOS to engage BLE musculature. Guided pt in 10 minisquats prior to ambulation  and sit<>stand training for improved muscle activation/ quad facilitation. Rise to stand improves from Min/ ModA to supervision during training.   5xSTS performed with time of 63 seconds (A score of 15 seconds or greater indicates patient is at an increased risk for falls. Education provided to patient on interpretation of balance score and use of test to compare to score again just prior to d/c in order to compare for progress assessment.)   NMR performed for improvements in motor control and coordination, balance, sequencing, judgement, and self confidence/ efficacy in performing all aspects of mobility at highest level of independence.   Wheelchair Mobility:  Patient propelled wheelchair 125 feet with BUE and supervision. Provided vc for continuing min to mod effort in order to maintain consistent propel/ momentum and progression.   Patient seated upright  in w/c at end of session with brakes locked, belt alarm set, and all needs within reach.     Therapy Documentation Precautions:  Precautions Precautions: Fall Precaution Comments: hx of back and R knee arthrtis, per pt can not lift more than 10 lbs Restrictions Weight Bearing Restrictions: No General:   Pain: Pain Assessment Pain Scale: 0-10 Pain Score: 0-No pain  Therapy/Group: Individual Therapy  Alger Simons PT, DPT 06/24/2021, 12:26 PM

## 2021-06-24 NOTE — Progress Notes (Signed)
PROGRESS NOTE   Subjective/Complaints: Pt concerned about right foot/toes which feel "numb". Also feels hard nodule on left breast. Family hx of breast cancer.   ROS: Patient denies fever, rash, sore throat, blurred vision, nausea, vomiting, diarrhea, cough, shortness of breath or chest pain, joint or back pain, headache, or mood change.   Objective:   No results found. No results for input(s): WBC, HGB, HCT, PLT in the last 72 hours. No results for input(s): NA, K, CL, CO2, GLUCOSE, BUN, CREATININE, CALCIUM in the last 72 hours.  Intake/Output Summary (Last 24 hours) at 06/24/2021 0919 Last data filed at 06/24/2021 0857 Gross per 24 hour  Intake 908.25 ml  Output --  Net 908.25 ml     Pressure Injury 06/12/21 Sacrum Left;Upper Deep Tissue Pressure Injury - Purple or maroon localized area of discolored intact skin or blood-filled blister due to damage of underlying soft tissue from pressure and/or shear. (Active)  06/12/21 1644  Location: Sacrum  Location Orientation: Left;Upper  Staging: Deep Tissue Pressure Injury - Purple or maroon localized area of discolored intact skin or blood-filled blister due to damage of underlying soft tissue from pressure and/or shear.  Wound Description (Comments):   Present on Admission: No     Pressure Injury 06/12/21 Sacrum Mid Stage 1 -  Intact skin with non-blanchable redness of a localized area usually over a bony prominence. (Active)  06/12/21 2000  Location: Sacrum  Location Orientation: Mid  Staging: Stage 1 -  Intact skin with non-blanchable redness of a localized area usually over a bony prominence.  Wound Description (Comments):   Present on Admission: Yes    Physical Exam: Vital Signs Blood pressure 112/62, pulse 74, temperature 97.6 F (36.4 C), temperature source Oral, resp. rate 16, height 5\' 6"  (1.676 m), weight 119.7 kg, last menstrual period 11/02/2013, SpO2 94  %.   Constitutional: No distress . Vital signs reviewed. HEENT: NCAT, EOMI, oral membranes moist Neck: supple Cardiovascular: RRR without murmur. No JVD    Respiratory/Chest: CTA Bilaterally without wheezes or rales. Normal effort    GI/Abdomen: BS +, non-tender, non-distended Ext: no clubbing, cyanosis, or edema Psych: pleasant and cooperative  Neuro:  Alert and oriented x 3. Fair insight and awareness. Intact Memory. Normal language and speech. Cranial nerve exam unremarkable. UE  strength 5/5. LLE 3-4/5 prox to 4/5 distally. RLE 1-2/5 prox (pain) to 4/5 distally. Subjective decrease in LT over dorsum of toes.  Musculoskeletal: no effusion or erythema in knees. Right leg laterally rotated.  Gyn: had hard movable nodule underneath left nipple. At least 1/2" in diameter.   Assessment/Plan: 1. Functional deficits which require 3+ hours per day of interdisciplinary therapy in a comprehensive inpatient rehab setting. Physiatrist is providing close team supervision and 24 hour management of active medical problems listed below. Physiatrist and rehab team continue to assess barriers to discharge/monitor patient progress toward functional and medical goals  Care Tool:  Bathing    Body parts bathed by patient: Right arm, Left arm, Chest, Abdomen, Right upper leg, Left upper leg, Face   Body parts bathed by helper: Front perineal area, Buttocks, Left lower leg, Right lower leg     Bathing  assist Assist Level: Moderate Assistance - Patient 50 - 74%     Upper Body Dressing/Undressing Upper body dressing   What is the patient wearing?: Pull over shirt    Upper body assist Assist Level: Set up assist    Lower Body Dressing/Undressing Lower body dressing    Lower body dressing activity did not occur: Safety/medical concerns What is the patient wearing?: Incontinence brief, Pants     Lower body assist Assist for lower body dressing: Maximal Assistance - Patient 25 - 49%      Toileting Toileting    Toileting assist Assist for toileting: Moderate Assistance - Patient 50 - 74%     Transfers Chair/bed transfer  Transfers assist     Chair/bed transfer assist level: Dependent - mechanical lift     Locomotion Ambulation   Ambulation assist   Ambulation activity did not occur: Safety/medical concerns          Walk 10 feet activity   Assist  Walk 10 feet activity did not occur: Safety/medical concerns        Walk 50 feet activity   Assist Walk 50 feet with 2 turns activity did not occur: Safety/medical concerns         Walk 150 feet activity   Assist Walk 150 feet activity did not occur: Safety/medical concerns         Walk 10 feet on uneven surface  activity   Assist Walk 10 feet on uneven surfaces activity did not occur: Safety/medical concerns         Wheelchair     Assist Is the patient using a wheelchair?: Yes Type of Wheelchair: Manual Wheelchair activity did not occur: Safety/medical concerns         Wheelchair 50 feet with 2 turns activity    Assist    Wheelchair 50 feet with 2 turns activity did not occur: Safety/medical concerns       Wheelchair 150 feet activity     Assist  Wheelchair 150 feet activity did not occur: Safety/medical concerns       Blood pressure 112/62, pulse 74, temperature 97.6 F (36.4 C), temperature source Oral, resp. rate 16, height 5\' 6"  (1.676 m), weight 119.7 kg, last menstrual period 11/02/2013, SpO2 94 %.  Medical Problem List and Plan: 1. 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 Has good strength otherwise. Encouraged her to  put pillows along outside of leg to help with rotation. 2.  Antithrombotics: -DVT/anticoagulation:  Pharmaceutical: Lovenox             -antiplatelet therapy: Aspirin 81 mg daily 3. Bilateral knee pain R>L. 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  -Ice to bilateral knees 15 minutes three times per day 4. Mood: Wellbutrin 300 mg daily, Zoloft 100 mg daily, Xanax 0.25 mg twice daily as needed  -mood seems appropriate. Husband is supportive             -antipsychotic agents: N/A 5. Neuropsych:  This patient is capable of making decisions on her own behalf. 6. Skin/Wound Care: Routine skin checks 7. Fluids/Electrolytes/Nutrition: encourage appropriate po intake  -protein supp for low albumin 8.  ID.  IV ceftriaxone 2 g every 12 hours as well as Flagyl 500 mg 3 times daily through 06/25/2021.   -Patient will need repeat MRI prior to completion of antibiotics per Dr. Drucilla Schmidt. -continue protonix and prn zofran for nausea -stools fairly formed until this morning 9.  Hypertension.   continue Lisinopril 5 mg daily.     1/16 well controlled 10.  Diabetes mellitus.  Hemoglobin A1c 6.6.  SSI.   -Patient on Lantus insulin 10 units nightly PRIOR TO ADMISSION as well as Victoza 1.2 mg dose daily.    CBG (last 3)  Recent Labs    06/23/21 1649 06/23/21 2120 06/24/21 0617  GLUCAP 151* 133* 149*  1/16 controlled  11.  Tobacco abuse.  NicoDerm patch.  Provide counseling 12.  Right shoulder pain.  MRI right shoulder mild distal supraspinatus and infraspinatus tendinosis.  Focal high-grade partial width tearing of the far anterior fibers of the supraspinatus tendon.  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   -pain is improved 1/16 -Conservative care, rom, strengthening with therapy - continue Voltaren gel 13.  Obesity.  BMI 43.95.  Dietary follow-up. Caregiver training on ergonomic assistance techniques as  she will likely continue to require some assistance upon discharge 14. Mild transaminitis  -LFT's remain slightly elevated but not too different from 1/5  -follow up this week 15. Breast nodule:  -there is no mammography at [facility]  -will try to order U/S breast    LOS: 4 days A FACE TO FACE EVALUATION WAS PERFORMED  Meredith Staggers 06/24/2021, 9:19 AM

## 2021-06-25 ENCOUNTER — Inpatient Hospital Stay (HOSPITAL_COMMUNITY): Payer: Commercial Managed Care - HMO

## 2021-06-25 DIAGNOSIS — M170 Bilateral primary osteoarthritis of knee: Secondary | ICD-10-CM | POA: Diagnosis not present

## 2021-06-25 DIAGNOSIS — G009 Bacterial meningitis, unspecified: Secondary | ICD-10-CM | POA: Diagnosis not present

## 2021-06-25 DIAGNOSIS — E1169 Type 2 diabetes mellitus with other specified complication: Secondary | ICD-10-CM | POA: Diagnosis not present

## 2021-06-25 DIAGNOSIS — I10 Essential (primary) hypertension: Secondary | ICD-10-CM | POA: Diagnosis not present

## 2021-06-25 LAB — GLUCOSE, CAPILLARY
Glucose-Capillary: 165 mg/dL — ABNORMAL HIGH (ref 70–99)
Glucose-Capillary: 107 mg/dL — ABNORMAL HIGH (ref 70–99)
Glucose-Capillary: 136 mg/dL — ABNORMAL HIGH (ref 70–99)
Glucose-Capillary: 183 mg/dL — ABNORMAL HIGH (ref 70–99)

## 2021-06-25 MED ORDER — ALPRAZOLAM 0.5 MG PO TABS
0.5000 mg | ORAL_TABLET | Freq: Once | ORAL | Status: AC
  Administered 2021-06-25: 0.5 mg via ORAL
  Filled 2021-06-25: qty 1

## 2021-06-25 MED ORDER — MICONAZOLE NITRATE 100 MG VA SUPP
100.0000 mg | Freq: Every day | VAGINAL | Status: DC
  Administered 2021-06-25 – 2021-07-01 (×7): 100 mg via VAGINAL
  Filled 2021-06-25 (×7): qty 1

## 2021-06-25 NOTE — Progress Notes (Signed)
Patient ID: Stephania Fragmin, female   DOB: Oct 27, 1960, 61 y.o.   MRN: [id number]  SW went by room to give pt updates from team conference, and inform on d/c date 1/24; but pt off the floor for MRI. SW will continue to make efforts to give updates and to coordinate d/c needs.  [name], MSW, [place] Office: [phone] Cell: [phone] Fax: [phone]

## 2021-06-25 NOTE — Progress Notes (Signed)
Occupational Therapy Session Note  Patient Details  Name: Lisa [name] MRN: [id number] Date of Birth: 1960/08/29  Today's Date: 06/25/2021 OT Individual Time: 7494-4967 OT Individual Time Calculation (min): 87 min   Short Term Goals: Week 1:  OT Short Term Goal 1 (Week 1): Pt will be able to rise to stand with RW with mod A of 1 in prep for toileting. OT Short Term Goal 2 (Week 1): Pt will be able to stand pivot to [facility] with RW with mod A of 1. OT Short Term Goal 3 (Week 1): Pt will don pants over feet with use of a reacher to avoid back pain. OT Short Term Goal 4 (Week 1): Pt will be able to wash feet with long handled sponge.  Skilled Therapeutic Interventions/Progress Updates:    Pt greeted semi-reclined in bed after recently returning from MRI. Pt wanted to eat lunch, but reported need to go to the bathroom. Pt completed bed mobility with min A and HOB elevated. Pt reported flat bed at home and will need to practice bed mobility this way. Pt attempted sit<>stand, but unable to achieve hip extension and returned to sitting. Pt reported she would rather use BSC instead of walking at this time, Min A to get to standing on second trial w/ RW and Min A to pivot to [facility]. PT able top manage clothing with min A.  Continent void of bladder, then set-up A for peri-care. Mod A for clothing management after, then stand-pivot to wc with RW and min A. Discussed home set-up and home modifications while pt eating lunch. Pt reported high tub ledge and was thinking she could use a step stool (exercise step stool) to get closer and step over the ledge. Pt reported she did not like the tub bench because she still needed assistance to lift legs over the side. Discussed why this may not be a great option with higher risk of falls. Will discuss this and practice other options for home set-up. Pt ambulated into bathroom with RW and min A to stand, pt with R knee buckle while ambulating requiring mod A to correct. Pt  then able to get to rest of the way to tub bench in shower. Mod A for doffing clothing. Bathing completed from tub bench using lateral leans to wash buttocks. Issued LH sponge for increased access to lower legs. Stand-pivot out of shower with multiple trials to stand and mod A. Min A for LB dressing from wc with min A for standing and encouragement to try to pull up pants. Pt hesitant to try removing unilateral UE support, but able to do with min/mod A for dynamic balance. Pt left seated in wc at end of session with alarm belt on, call bell in reach, and needs met.   Therapy Documentation Precautions:  Precautions Precautions: Fall Precaution Comments: hx of back and R knee arthrtis, per pt can not lift more than 10 lbs Restrictions Weight Bearing Restrictions: No Other Position/Activity Restrictions: No lifting more than 10 lbs secondary to CSF leak in the past Pain: Pt reports chronic back pain, no number given. Repositioned for comfort.    Therapy/Group: Individual Therapy  Valma Cava 06/25/2021, 2:02 PM

## 2021-06-25 NOTE — Care Management (Signed)
Inpatient [place] Individual Statement of Services  Patient Name:  Lisa [name]  Date:  06/25/2021  Welcome to the [facility].  Our goal is to provide you with an individualized program based on your diagnosis and situation, designed to meet your specific needs.  With this comprehensive rehabilitation program, you will be expected to participate in at least 3 hours of rehabilitation therapies Monday-Friday, with modified therapy programming on the weekends.  Your rehabilitation program will include the following services:  Physical Therapy (PT), Occupational Therapy (OT), Speech Therapy (ST), 24 hour per day rehabilitation nursing, Therapeutic Recreaction (TR), Psychology, Neuropsychology, Care Coordinator, Rehabilitation Medicine, [place], and Other  Weekly team conferences will be held on Tuesdays to discuss your progress.  Your Inpatient Rehabilitation Care Coordinator will talk with you frequently to get your input and to update you on team discussions.  Team conferences with you and your family in attendance may also be held.  Expected length of stay: 20-21 days   Overall anticipated outcome: Minimal Assistance  Depending on your progress and recovery, your program may change. Your Inpatient Rehabilitation Care Coordinator will coordinate services and will keep you informed of any changes. Your Inpatient Rehabilitation Care Coordinator's name and contact numbers are listed  below.  The following services may also be recommended but are not provided by the [facility] will be made to provide these services after discharge if needed.  Arrangements include referral to agencies that provide these services.  Your insurance has been verified to be:  [place]  Your primary  doctor is:  Josephina Shih  Pertinent information will be shared with your doctor and your insurance company.  Inpatient Rehabilitation Care Coordinator:  Cathleen Corti [phone] or ([phone]  Information discussed with and copy given to patient by: [name], 06/25/2021, 10:27 AM

## 2021-06-25 NOTE — Progress Notes (Signed)
Physical Therapy Session Note  Patient Details  Name: Lisa [name] MRN: [id number] Date of Birth: August 09, 1960  Today's Date: 06/25/2021 PT Individual Time: 0903-0959 PT Individual Time Calculation (min): 56 min   Short Term Goals: Week 1:  PT Short Term Goal 1 (Week 1): Pt will perform rolling in bed from side to side for pressure relief with overall CGA. PT Short Term Goal 2 (Week 1): Pt will perform supine<>sit with MinA using bed features. PT Short Term Goal 3 (Week 1): Pt will perform sit<>stand to RW with MaxA +1. PT Short Term Goal 4 (Week 1): Pt will initiate gait training. PT Short Term Goal 5 (Week 1): Pt will perform stand pivot transfers with ModA.  Skilled Therapeutic Interventions/Progress Updates:     Pt received seated in [facility] and agrees to therapy. No complaint of pain. WC transport to gym for time management. Pt performs sit to stand and stand step transfer to mat table with CGA/minA and cues for hand placement, RW management, and positioning. Pt performs sit to stand with cues for hand placement and body mechanics, and marches in place x1:00 holding onto bari RW with bilateral upper extremities. Pt takes seated reset break. Pt ambulates x30' with bariRW and CGA, with cues for upright gaze to improve posture and balance. Following extended seated rest break, pt ambulates x50' with same assist levels. PT swaps out bari RW for wide standard RW to trial with pt. Pt ambulates x54' with RW and verbalizes improved feeling relative to bariRW. Pt verbalizes increasing back pain following ambulation. PT educates on decreasing WB through RW to decrease pressure on spine and for energy conservation. Pt performs stand step transfer back to [facility] with CGA. Left seated in WC with alarm intact and all needs within reach.  Therapy Documentation Precautions:  Precautions Precautions: Fall Precaution Comments: hx of back and R knee arthrtis, per pt can not lift more than 10  lbs Restrictions Weight Bearing Restrictions: No   Therapy/Group: Individual Therapy  Breck Coons, PT, DPT 06/25/2021, 3:53 PM

## 2021-06-25 NOTE — Progress Notes (Signed)
Occupational Therapy Session Note  Patient Details  Name: Lisa [name] MRN: [id number] Date of Birth: April 15, 1961  Today's Date: 06/25/2021 OT Individual Time: 3329-5188 OT Individual Time Calculation (min): 56 min    Short Term Goals: Week 1:  OT Short Term Goal 1 (Week 1): Pt will be able to rise to stand with RW with mod A of 1 in prep for toileting. OT Short Term Goal 2 (Week 1): Pt will be able to stand pivot to [facility] with RW with mod A of 1. OT Short Term Goal 3 (Week 1): Pt will don pants over feet with use of a reacher to avoid back pain. OT Short Term Goal 4 (Week 1): Pt will be able to wash feet with long handled sponge.  Skilled Therapeutic Interventions/Progress Updates:    Pt sitting in the wheelchair to start session donning her TEDs.  Mod assist needed for finishing donning them as well as her shoes secondary to not being able to cross up and maintain the RLE over the left knee.  She then completed toilet transfer walking in to the bathroom with min assist using the RW for support.  Mod assist for clothing management and toilet hygiene sit to stand before ambulation out to the sink for washing her hands at min assist level in standing.  She was then taken down to the tub room via wheelchair where she completed simulated tub/shower transfers with use of a tub bench and RW.  Mod assist for lifting her LLE into and out of the tub with min assist for sit to stand.  Pt reports that the edge of her tub is much higher than the one we practiced with.  Discussed having someone in her family measuring the height from the floor to determine if a tub bench with legs will work.  If it is too high, have shown patient another tub bench option that will sit on the edge of the tub without legs as well.  Finished session with pt pushing her wheelchair back to the room where she was left sitting up with the call button and phone in reach and safety alarm belt in place.    Therapy  Documentation Precautions:  Precautions Precautions: Fall Precaution Comments: hx of back and R knee arthrtis, per pt can not lift more than 10 lbs Restrictions Weight Bearing Restrictions: No Other Position/Activity Restrictions: No lifting more than 10 lbs secondary to CSF leak in the past  Pain: Pain Assessment Pain Scale: 0-10 Pain Score: 0-No pain Faces Pain Scale: Hurts little more Pain Type: Chronic pain Pain Location: Knee Pain Orientation: Right Pain Descriptors / Indicators: Discomfort Pain Onset: With Activity Pain Intervention(s): Emotional support;Repositioned ADL: See Care Tool Section for some details of mobility and selfcare   Therapy/Group: Individual Therapy  Naisha Wisdom OTR/L 06/25/2021, 9:50 AM

## 2021-06-25 NOTE — Patient Care Conference (Signed)
Inpatient RehabilitationTeam Conference and Plan of Care Update Date: 06/25/2021   Time: 10:30 AM    Patient Name: Lisa [name]      Medical Record Number: [id number]  Date of Birth: [birth year] Sex: Female         Room/Bed: 4M03C/4M03C-01 Payor Info: Payor: [employer] / Plan: Electrical engineer / Product Type: *No Product type* /    Admit Date/Time:  06/20/2021  3:30 PM  Primary Diagnosis:  Acute bacterial meningitis  Hospital Problems: Principal Problem:   Acute bacterial meningitis    Expected Discharge Date: Expected Discharge Date: 07/02/21  Team Members Present: Physician leading conference: Dr. Alger Simons Social Worker Present: [name], [place] Nurse Present: Dorien Chihuahua, RN PT Present: Tereasa Coop, PT OT Present: Cherylynn Ridges, OT SLP Present: Weston Anna, SLP PPS Coordinator present : Gunnar Fusi, SLP     Current Status/Progress Goal Weekly Team Focus  Bowel/Bladder   Pt continent of b/b Last bm: 1/16  to remain continent  Frequent toileting Q shift and PRN   Swallow/Nutrition/ Hydration   Regular solids with thin liquids, Mod I  mod I  Goals met, patient d/c from SLP   ADL's   Min A functional transfers, Min/mod A LB ADLs and toileting  Supervision  self-care retraining, activity tolerance, general strengthening, sit<>stands   Mobility   Bed Mobility = MinA, can sit on EOB with distant SUP; sit<>stand transfers = up to MinA with vc for technique, stand pivot with RW = MinA with guard to R knee; Gait = CGA/ MinA for 25' using RW  Bed mobility at [facility]; Transfers at MinA/ CGA; Gait at [employer]; Stairs at [facility]  general strengthening, challenging activity tolerance, building confidence, improving LOA for all bed mobility, transfers, gait, stairs, family education   Communication             Safety/Cognition/ Behavioral Observations            Pain   Pt complains of pain in R mid back and shoulder.  Pt rates pain 7/10  Patient states pain <4  assess pain q  shift and PRN   Skin   Pt has no skin issues on the sacrum  pt skin to remain intact  Skin assessment q shift and PRN     Discharge Planning:  To be assessed. 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 Plans to hired 24/7 care. SW will confirm there are no barriers to discharge. Concerns for barriers to d/c with regard to obtaining [place] due to insurance.   Team Discussion: Patient admitted post H flu and meningitis, CSF leaks. Patient with chronic knee pain and new right shoulder pain/tendonitis and small tear. OP follow up referral for breast nodule/mammogram.   Patient on target to meet rehab goals: yes, currently needs min assist for toileting and completes sit - stand and transfers with CGA. Needs set up for upper body care and mi assist for lower body care. Able to ambulate up to 65' with a RW and CGA.   *See Care Plan and progress notes for long and short-term goals.   Revisions to Treatment Plan:  Abx. timeline extension pending MRI   SLP eval and services discontinued  Teaching Needs: Safety, medication management, transfers, toileting, etc.  Current Barriers to Discharge: Decreased caregiver support, insurance for [facility] follow up services  Possible Resolutions to Barriers: Family education 24/7 supervision/care; recommended private duty caregivers to supplement  spouse and daughter [place] follow up services recommended     Medical Summary Current Status: h influenza bacteremia, meningitis. 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 left breast nodule.  Barriers to Discharge: Medical stability   Possible Resolutions to Barriers/Weekly Focus: confirm ID plan (abx/follow up MRI), pain control.   Continued Need for Acute Rehabilitation Level of Care: The patient requires daily medical management by a physician with specialized training in physical medicine and rehabilitation for the following reasons: Direction of a multidisciplinary  physical rehabilitation program to maximize functional independence : Yes Medical management of patient stability for increased activity during participation in an intensive rehabilitation regime.: Yes Analysis of laboratory values and/or radiology reports with any subsequent need for medication adjustment and/or medical intervention. : Yes   I attest that I was present, lead the team conference, and concur with the assessment and plan of the team.   Dorien Chihuahua B 06/25/2021, 2:07 PM

## 2021-06-25 NOTE — Progress Notes (Signed)
PROGRESS NOTE   Subjective/Complaints: Pt in good spirits. No new complaints today. Therapy went well this morning  ROS: Patient denies fever, rash, sore throat, blurred vision, nausea, vomiting, diarrhea, cough, shortness of breath or chest pain,   headache, or mood change.   Objective:   No results found. No results for input(s): WBC, HGB, HCT, PLT in the last 72 hours. No results for input(s): NA, K, CL, CO2, GLUCOSE, BUN, CREATININE, CALCIUM in the last 72 hours.  Intake/Output Summary (Last 24 hours) at 06/25/2021 0919 Last data filed at 06/25/2021 0757 Gross per 24 hour  Intake 1080 ml  Output --  Net 1080 ml     Pressure Injury 06/12/21 Sacrum Left;Upper Deep Tissue Pressure Injury - Purple or maroon localized area of discolored intact skin or blood-filled blister due to damage of underlying soft tissue from pressure and/or shear. (Active)  06/12/21 1644  Location: Sacrum  Location Orientation: Left;Upper  Staging: Deep Tissue Pressure Injury - Purple or maroon localized area of discolored intact skin or blood-filled blister due to damage of underlying soft tissue from pressure and/or shear.  Wound Description (Comments):   Present on Admission: No     Pressure Injury 06/12/21 Sacrum Mid Stage 1 -  Intact skin with non-blanchable redness of a localized area usually over a bony prominence. (Active)  06/12/21 2000  Location: Sacrum  Location Orientation: Mid  Staging: Stage 1 -  Intact skin with non-blanchable redness of a localized area usually over a bony prominence.  Wound Description (Comments):   Present on Admission: Yes    Physical Exam: Vital Signs Blood pressure (!) 112/58, pulse 79, temperature 98.6 F (37 C), temperature source Oral, resp. rate 16, height 5\' 6"  (1.676 m), weight 119.7 kg, last menstrual period 11/02/2013, SpO2 95 %.   Constitutional: No distress . Vital signs reviewed. HEENT: NCAT,  EOMI, oral membranes moist Neck: supple Cardiovascular: RRR without murmur. No JVD    Respiratory/Chest: CTA Bilaterally without wheezes or rales. Normal effort    GI/Abdomen: BS +, non-tender, non-distended Ext: no clubbing, cyanosis, or edema Psych: pleasant and cooperative  Skin:: sacral wounds Neuro:  Alert and oriented x 3. Fair insight and awareness. Intact Memory. Normal language and speech. Cranial nerve exam unremarkable. UE  strength 5/5. LLE 3-4/5 prox to 4/5 distally. RLE 1-2/5 prox (pain) to 4/5 distally. Subjective decrease in LT over dorsum of toes +/-.  Musculoskeletal: no effusion or erythema in knees. Right leg laterally rotated.  Gyn:  hard movable nodule underneath left nipple. At least 1/2" in diameter.   Assessment/Plan: 1. Functional deficits which require 3+ hours per day of interdisciplinary therapy in a comprehensive inpatient rehab setting. Physiatrist is providing close team supervision and 24 hour management of active medical problems listed below. Physiatrist and rehab team continue to assess barriers to discharge/monitor patient progress toward functional and medical goals  Care Tool:  Bathing    Body parts bathed by patient: Right arm, Left arm, Chest, Abdomen, Right upper leg, Left upper leg, Face   Body parts bathed by helper: Front perineal area, Buttocks, Left lower leg, Right lower leg     Bathing assist Assist Level: Moderate Assistance -  Patient 50 - 74%     Upper Body Dressing/Undressing Upper body dressing   What is the patient wearing?: Pull over shirt    Upper body assist Assist Level: Minimal Assistance - Patient > 75%    Lower Body Dressing/Undressing Lower body dressing    Lower body dressing activity did not occur: Safety/medical concerns What is the patient wearing?: Incontinence brief, Pants     Lower body assist Assist for lower body dressing: Moderate Assistance - Patient 50 - 74%     Toileting Toileting    Toileting  assist Assist for toileting: Maximal Assistance - Patient 25 - 49%     Transfers Chair/bed transfer  Transfers assist     Chair/bed transfer assist level: Moderate Assistance - Patient 50 - 74%     Locomotion Ambulation   Ambulation assist   Ambulation activity did not occur: Safety/medical concerns          Walk 10 feet activity   Assist  Walk 10 feet activity did not occur: Safety/medical concerns        Walk 50 feet activity   Assist Walk 50 feet with 2 turns activity did not occur: Safety/medical concerns         Walk 150 feet activity   Assist Walk 150 feet activity did not occur: Safety/medical concerns         Walk 10 feet on uneven surface  activity   Assist Walk 10 feet on uneven surfaces activity did not occur: Safety/medical concerns         Wheelchair     Assist Is the patient using a wheelchair?: Yes Type of Wheelchair: Manual Wheelchair activity did not occur: Safety/medical concerns         Wheelchair 50 feet with 2 turns activity    Assist    Wheelchair 50 feet with 2 turns activity did not occur: Safety/medical concerns       Wheelchair 150 feet activity     Assist  Wheelchair 150 feet activity did not occur: Safety/medical concerns       Blood pressure (!) 112/58, pulse 79, temperature 98.6 F (37 C), temperature source Oral, resp. rate 16, height 5\' 6"  (1.676 m), weight 119.7 kg, last menstrual period 11/02/2013, SpO2 95 %.  Medical Problem List and Plan: 1. 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 Interdisciplinary team conference today to discuss goals, barriers to discharge, and  dc planning.  2.  Antithrombotics: -DVT/anticoagulation:  Pharmaceutical: Lovenox             -antiplatelet therapy: Aspirin 81 mg daily 3. Bilateral knee pain R>L. 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  -Ice to bilateral knees 15 minutes three times per day 4. Mood: Wellbutrin 300 mg daily, Zoloft 100 mg daily, Xanax 0.25 mg twice daily as needed  -mood seems appropriate. Husband is supportive             -antipsychotic agents: N/A 5. Neuropsych: This patient is capable of  making decisions on her own behalf. 6. Skin/Wound Care: Routine skin checks 7. Fluids/Electrolytes/Nutrition: encourage appropriate po intake  -protein supp for low albumin 8.  ID.  IV ceftriaxone 2 g every 12 hours as well as Flagyl 500 mg 3 times daily through 06/25/2021.   -Patient will need repeat MRI prior to completion of antibiotics per Dr. Beverly Gust check with ID today 1/17 -continue protonix and prn zofran for nausea -stools fairly formed until this morning 9.  Hypertension.   continue Lisinopril 5 mg daily.     1/16 well controlled 10.  Diabetes mellitus.  Hemoglobin A1c 6.6.  SSI.   -Patient on Lantus insulin 10 units nightly PRIOR TO ADMISSION as well as Victoza 1.2 mg dose daily.    CBG (last 3)  Recent Labs    06/24/21 1636 06/24/21 2155 06/25/21 0640  GLUCAP 167* 144* 165*  1/17 reasonably controlled  11.  Tobacco abuse.  NicoDerm patch.  Provide counseling 12.  Right shoulder pain.  MRI right shoulder mild distal supraspinatus and infraspinatus tendinosis.  Focal high-grade partial width tearing of the far anterior fibers of the supraspinatus tendon.  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   -pain is improved 1/16 -Conservative care, rom, strengthening with therapy - continue Voltaren gel 13.  Obesity.  BMI 43.95.  Dietary follow-up. Caregiver training on ergonomic assistance techniques as  she will likely continue to require some assistance upon discharge 14. Mild transaminitis  -LFT's remain slightly elevated but not too different from 1/5  -follow up this week 15. Breast nodule:  -there is no mammography at [facility]  -U/S will not do study here. Will schedule mammogram at breast center.     LOS: 5 days A FACE TO FACE EVALUATION WAS PERFORMED  Meredith Staggers 06/25/2021, 9:19 AM

## 2021-06-26 LAB — GLUCOSE, CAPILLARY
Glucose-Capillary: 152 mg/dL — ABNORMAL HIGH (ref 70–99)
Glucose-Capillary: 97 mg/dL (ref 70–99)
Glucose-Capillary: 112 mg/dL — ABNORMAL HIGH (ref 70–99)
Glucose-Capillary: 202 mg/dL — ABNORMAL HIGH (ref 70–99)

## 2021-06-26 LAB — CULTURE, BLOOD (ROUTINE X 2): Special Requests: ADEQUATE

## 2021-06-26 MED ORDER — INSULIN GLARGINE-YFGN 100 UNIT/ML ~~LOC~~ SOLN
5.0000 [IU] | Freq: Every day | SUBCUTANEOUS | Status: DC
  Administered 2021-06-26 – 2021-07-01 (×6): 5 [IU] via SUBCUTANEOUS
  Filled 2021-06-26 (×7): qty 0.05

## 2021-06-26 NOTE — Progress Notes (Signed)
PROGRESS NOTE   Subjective/Complaints: Pt up in chair. Putting on shoes. Feels pretty well, progressing with therapy. Slept well  ROS: Patient denies fever, rash, sore throat, blurred vision, nausea, vomiting, diarrhea, cough, shortness of breath or chest pain, headache, or mood change.    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 COMPARISON:  Recent prior MRIs FINDINGS: Brain: Punctate foci of diffusion hyperintensity along the tentorial incisura (series 2, images 22-24). This is in not seen on the coronal sequence. Possible trace dependent diffusion hyperintensity in the occipital horns and fourth ventricle no longer identified. A punctate focus of diffusion hyperintensity in the right temporal-occipital region appears to be extraventricular (series 2, image 22). There is no evidence of intracranial hemorrhage. There is no intracranial mass, mass effect, or edema. Ventricles and sulci are stable in size and configuration. There is no hydrocephalus or extra-axial fluid collection. Vascular: Major vessel flow voids at the skull base are preserved. Skull and upper cervical spine: Normal marrow signal is preserved. Sinuses/Orbits: Minor mucosal thickening.  Orbits are unremarkable. Other: Sella is unremarkable.  Persistent mastoid effusions. IMPRESSION: Punctate foci of diffusion hyperintensity along the tentorial incisura are probably artifactual. Less likely could reflect trace subdural blood products. Suspected trace abnormal signal within the ventricles on the prior study is no longer identified. A punctate focus of diffusion hyperintensity in the right temporal-occipital region appears to be  extra-articular and could reflect a tiny subacute infarct. Persistent nonspecific mastoid effusions. Electronically Signed   By: Macy Mis M.D.   On: 06/25/2021 13:55   No results for input(s): WBC, HGB, HCT, PLT in the last 72 hours. No results for input(s): NA, K, CL, CO2, GLUCOSE, BUN, CREATININE, CALCIUM in the last 72 hours.  Intake/Output Summary (Last 24 hours) at 06/26/2021 0818 Last data filed at 06/26/2021 0804 Gross per 24 hour  Intake 1440 ml  Output --  Net 1440 ml     Pressure Injury 06/12/21 Sacrum Left;Upper Deep Tissue Pressure Injury - Purple or maroon localized area of discolored intact skin or blood-filled blister due to damage of underlying soft tissue from pressure and/or shear. (Active)  06/12/21 1644  Location: Sacrum  Location Orientation: Left;Upper  Staging: Deep Tissue Pressure Injury - Purple or maroon localized area of discolored intact skin or blood-filled blister due to damage of underlying soft tissue from pressure and/or shear.  Wound Description (Comments):   Present on Admission: No     Pressure Injury 06/12/21 Sacrum Mid Stage 1 -  Intact skin with non-blanchable redness of a localized area usually over a bony prominence. (Active)  06/12/21 2000  Location: Sacrum  Location Orientation: Mid  Staging: Stage 1 -  Intact skin with non-blanchable redness of a localized area usually over a bony prominence.  Wound Description (Comments):   Present on Admission: Yes    Physical Exam: Vital Signs Blood pressure 105/63, pulse 66, temperature (!) 97.5 F (36.4 C), resp. rate 16, height 5\' 6"  (1.676 m), weight 119.7 kg,  last menstrual period 11/02/2013, SpO2 96 %.   Constitutional: No distress . Vital signs reviewed. HEENT: NCAT, EOMI, oral membranes moist Neck: supple Cardiovascular: RRR without murmur. No JVD    Respiratory/Chest: CTA Bilaterally without wheezes or rales. Normal effort    GI/Abdomen: BS +, non-tender, non-distended Ext: no  clubbing, cyanosis, or edema Psych: pleasant and cooperative  Skin:: sacral wounds Neuro:  Alert and oriented x 3. Fair insight and awareness. Intact Memory. Normal language and speech. Cranial nerve exam unremarkable. UE  strength 5/5. LLE 3-4/5 prox to 4/5 distally. RLE 1-2/5 prox (pain) to 4/5 distally. Subjective decrease in LT over dorsum of toes +/-.  Musculoskeletal: no effusion or erythema in knees. +jt line pain at knees Gyn: persistent hard movable nodule underneath left nipple. At least 1/2" in diameter.   Assessment/Plan: 1. Functional deficits which require 3+ hours per day of interdisciplinary therapy in a comprehensive inpatient rehab setting. Physiatrist is providing close team supervision and 24 hour management of active medical problems listed below. Physiatrist and rehab team continue to assess barriers to discharge/monitor patient progress toward functional and medical goals  Care Tool:  Bathing    Body parts bathed by patient: Right arm, Left arm, Chest, Abdomen, Right upper leg, Left upper leg, Face   Body parts bathed by helper: Front perineal area, Buttocks, Left lower leg, Right lower leg     Bathing assist Assist Level: Moderate Assistance - Patient 50 - 74%     Upper Body Dressing/Undressing Upper body dressing   What is the patient wearing?: Pull over shirt    Upper body assist Assist Level: Minimal Assistance - Patient > 75%    Lower Body Dressing/Undressing Lower body dressing    Lower body dressing activity did not occur: Safety/medical concerns What is the patient wearing?: Incontinence brief, Pants     Lower body assist Assist for lower body dressing: Moderate Assistance - Patient 50 - 74%     Toileting Toileting    Toileting assist Assist for toileting: Moderate Assistance - Patient 50 - 74%     Transfers Chair/bed transfer  Transfers assist     Chair/bed transfer assist level: Moderate Assistance - Patient 50 - 74%      Locomotion Ambulation   Ambulation assist   Ambulation activity did not occur: Safety/medical concerns  Assist level: Minimal Assistance - Patient > 75% Assistive device: Walker-rolling Max distance: 10'   Walk 10 feet activity   Assist  Walk 10 feet activity did not occur: Safety/medical concerns        Walk 50 feet activity   Assist Walk 50 feet with 2 turns activity did not occur: Safety/medical concerns         Walk 150 feet activity   Assist Walk 150 feet activity did not occur: Safety/medical concerns         Walk 10 feet on uneven surface  activity   Assist Walk 10 feet on uneven surfaces activity did not occur: Safety/medical concerns         Wheelchair     Assist Is the patient using a wheelchair?: Yes Type of Wheelchair: Manual Wheelchair activity did not occur: Safety/medical concerns         Wheelchair 50 feet with 2 turns activity    Assist    Wheelchair 50 feet with 2 turns activity did not occur: Safety/medical concerns       Wheelchair 150 feet activity     Assist  Wheelchair 150 feet activity did  not occur: Safety/medical concerns       Blood pressure 105/63, pulse 66, temperature (!) 97.5 F (36.4 C), resp. rate 16, height 5\' 6"  (1.676 m), weight 119.7 kg, last menstrual period 11/02/2013, SpO2 96 %.  Medical Problem List and Plan: 1. 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  Antithrombotics: -DVT/anticoagulation:  Pharmaceutical: Lovenox             -antiplatelet therapy: Aspirin 81 mg daily 3. Bilateral knee pain R>L. 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  -Ice to bilateral knees   4. Mood: Wellbutrin 300 mg daily, Zoloft 100 mg daily, Xanax 0.25 mg twice daily as needed  -mood seems appropriate. Husband is supportive             -antipsychotic agents: N/A 5. Neuropsych: This patient is capable of making decisions on her own behalf. 6. Skin/Wound Care: Routine skin checks 7. Fluids/Electrolytes/Nutrition: encourage appropriate po intake  -protein supp for low albumin 8.  ID.  IV ceftriaxone 2 g every 12 hours as well as Flagyl 500 mg 3 times daily through 06/25/2021. --COMPLETE  -1/17 MRI demonstrated improvement, no abscesses -continue protonix and prn zofran for nausea   9.  Hypertension.   continue Lisinopril 5 mg daily.     1/8 well controlled 10.  Diabetes mellitus.  Hemoglobin A1c 6.6.  SSI.   -Patient on Lantus insulin 10 units nightly PRIOR TO ADMISSION as well as Victoza 1.2 mg dose daily.    CBG (last 3)  Recent Labs    06/25/21 1645 06/25/21 2128 06/26/21 0617  GLUCAP 136* 183* 152*  1/18 will resume lantus at 5u qhs 11.  Tobacco abuse.  NicoDerm patch.  Provide counseling 12.  Right shoulder pain.  MRI right shoulder mild distal supraspinatus and infraspinatus tendinosis.  Focal high-grade partial width tearing of the far anterior fibers of the supraspinatus tendon.  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   -pain is improved   -Conservative care, rom, strengthening with therapy - continue Voltaren gel 13.  Obesity.  BMI 43.95.  Dietary follow-up. Caregiver training on ergonomic assistance techniques as she will likely continue to require some assistance upon discharge 14. Mild transaminitis  -LFT's remain slightly elevated but not too different from 1/5  -follow up Thursday  15. Breast nodule:  -PA is working on mammogram at breast center.     LOS: 6 days A FACE TO FACE EVALUATION WAS PERFORMED  Meredith Staggers 06/26/2021, 8:18 AM

## 2021-06-26 NOTE — Progress Notes (Signed)
Patient ID: Lisa [name], female   DOB: 12-22-1960, 61 y.o.   MRN: [id number]  SW met with pt and pt husband in room to review updates from team conference, and d/c date. SW informed will provide updates once there is more information. Fam edu set for tomorrow 2pm-4pm.   *Medical team recommends outpatient therapy. SW met with pt in room to inform, and she would like [facility].  SW sent outpatient PT/OT referral to [facility] ([phone]).  [name], MSW, [place] Office: [phone] Cell: [phone] Fax: [phone]

## 2021-06-26 NOTE — Progress Notes (Signed)
Occupational Therapy Session Note  Patient Details  Name: Lisa [name] MRN: [id number] Date of Birth: [birth year]  Session 1 Today's Date: 06/26/2021 OT Individual Time: 1448-1856 OT Individual Time Calculation (min): 55 min    Session 2 Today's Date: 06/26/2021 OT Individual Time: 3149-7026 OT Individual Time Calculation (min): 27 min    Short Term Goals: Week 1:  OT Short Term Goal 1 (Week 1): Pt will be able to rise to stand with RW with mod A of 1 in prep for toileting. OT Short Term Goal 2 (Week 1): Pt will be able to stand pivot to [facility] with RW with mod A of 1. OT Short Term Goal 3 (Week 1): Pt will don pants over feet with use of a reacher to avoid back pain. OT Short Term Goal 4 (Week 1): Pt will be able to wash feet with long handled sponge.  Skilled Therapeutic Interventions/Progress Updates:    Pt received in the recliner with moderate pain in her knees, chronic arthritic pain, un-rated Rest breaks provided throughout session as needed. Husband present throughout session and supportive. She completed a sit > stand with mod lifting A. Once on her feet she required min- CGA for functional mobility into the bathroom. Min cueing for [employer] and safety. Pt completed toileting tasks with min A to pull up clothing. 50 ft of functional mobility with the RW with CGA, husband with the w/c follow. Pt required frequent seated rest breaks d/t c/o back and arm pain, and cardiorespiratory endurance deficits. BITS activity in standing to challenge UE reaching, standing balance, and standing endurance. Challenged pt to attempt standing without UE support, requiring CGA-min A. Pt transferred to the mat with CGA using the RW. She completed BUE strengthening circuit with a 5lb dowel, with adjustments made for R shoulder chronic pain and cueing for proper muscle activation. Pt completed another 50 ft of functional mobility with CGA. She was left sitting up in her w/c with all needs met, husband  present.    Session 2:  Pt received sitting in the w/c reporting fatigue from previous sessions but agreeable to OT session. She was transported via w/c to the therapy gym. Sit> stand with CGA using the RW. Ambulatory transfer to the Nustep with CGA with the RW. Pt completed 10 min on the Nustep for generalized strengthening/endurance. Pt returned to her room and was left sitting up with all needs met.    Therapy Documentation Precautions:  Precautions Precautions: Fall Precaution Comments: hx of back and R knee arthrtis, per pt can not lift more than 10 lbs Restrictions Weight Bearing Restrictions: No Other Position/Activity Restrictions: No lifting more than 10 lbs secondary to CSF leak in the past  Therapy/Group: Individual Therapy  Curtis Sites 06/26/2021, 6:16 AM

## 2021-06-26 NOTE — Progress Notes (Signed)
Physical Therapy Session Note  Patient Details  Name: Lisa [name] MRN: [id number] Date of Birth: 02-28-1961  Today's Date: 06/26/2021 PT Individual Time: 0930-1010 PT Individual Time Calculation (min): 40 min   Short Term Goals: Week 1:  PT Short Term Goal 1 (Week 1): Pt will perform rolling in bed from side to side for pressure relief with overall CGA. PT Short Term Goal 2 (Week 1): Pt will perform supine<>sit with MinA using bed features. PT Short Term Goal 3 (Week 1): Pt will perform sit<>stand to RW with MaxA +1. PT Short Term Goal 4 (Week 1): Pt will initiate gait training. PT Short Term Goal 5 (Week 1): Pt will perform stand pivot transfers with ModA.  Skilled Therapeutic Interventions/Progress Updates:    Pt received seated in w/c in room, reports feeling fatigued as she just finished OT session but agreeable to PT session. Pt reports pain in multiple places in her body including her low back, R shoulder, knees, and her head. Utilized repositioning and stretching during session for pain management. Sit to stand with CGA to RW with increased time needed to complete transfer. Ambulation 2 x 25 ft with RW and CGA for balance with extended rest break required between bouts of ambulation due to fatigue. Pt reports onset of BUE fatigue during gait due to heavy UE reliance on RW. Sit to/from supine on flat mat table with min A for LE management and/or trunk elevation. Supine low back stretching and strengthening for pain management: LTR x 20 reps, LTR on therapy ball x 10 reps, heel slides on therapy ball x 10 reps B, SKFO x 10 reps B, bridges x 10 reps with focus on TA contraction. Pt reports increase in R shoulder pain in supine position and requests to return to sitting. Pt then requests to return to her room due to fatigue. Stand pivot transfer mat table to w/c with RW and CGA. Pt left seated in w/c in room with needs in reach, husband present. Pt missed 20 min of scheduled therapy session  due to fatigue.  Therapy Documentation Precautions:  Precautions Precautions: Fall Precaution Comments: hx of back and R knee arthrtis, per pt can not lift more than 10 lbs Restrictions Weight Bearing Restrictions: No Other Position/Activity Restrictions: No lifting more than 10 lbs secondary to CSF leak in the past General: PT Amount of Missed Time (min): 20 Minutes PT Missed Treatment Reason: Patient fatigue      Therapy/Group: Individual Therapy   Excell Seltzer, PT, DPT, CSRS  06/26/2021, 10:21 AM

## 2021-06-26 NOTE — Progress Notes (Signed)
Physical Therapy Session Note  Patient Details  Name: Lisa [name] MRN: [id number] Date of Birth: Feb 12, 1961  Today's Date: 06/26/2021 PT Individual Time: 1105-1200 PT Individual Time Calculation (min): 55 min   Short Term Goals: Week 1:  PT Short Term Goal 1 (Week 1): Pt will perform rolling in bed from side to side for pressure relief with overall CGA. PT Short Term Goal 2 (Week 1): Pt will perform supine<>sit with MinA using bed features. PT Short Term Goal 3 (Week 1): Pt will perform sit<>stand to RW with MaxA +1. PT Short Term Goal 4 (Week 1): Pt will initiate gait training. PT Short Term Goal 5 (Week 1): Pt will perform stand pivot transfers with ModA.  Skilled Therapeutic Interventions/Progress Updates:     Pt received seated in [facility] and agrees to therapy. Reports some pain in back and upper arms. Number not provided but pt does report having taken pain meds prior to therapy. PT provides rest breaks as needed to manage pain. WC transport to gym for time management. Pt performs gait training, postural retraining, and strengthening in parallel bars. Sit to stand with CGA and cues for initiation and sequencing. Pt performs forward and backward ambulation x5' each with cues to widen stance for broader base of support. Prior to aking rest break pt performs sidestepping 7' to the right and x7' to the L to work on hip abductor strengthening. Following seated rest break, pt performs additional 4x7' sidestepping with cue to maintain >6" stance width at all times to increase engagement of hip abductors throughout cycle. Pt performs x20 alternating standing marches with cue to tap thigh to bar and maintain hip extension throughout. R lower extremity fatigues prior to L lower extremity. Pt performs x10 heel raises and attempts toe raises but is unable to complete ROM against gravity, and reports increasing R shoulder pain while attempting. PT adjusts activity to address pain. Pt performs x10 minisquats  with green, heavy resistance theraband around distal thighs to recruit hip abductors. Finally, pt performs x7' each sidestepping with green theraband. Pt left seated in WC with alarm intact and all needs within reach.  Therapy Documentation Precautions:  Precautions Precautions: Fall Precaution Comments: hx of back and R knee arthrtis, per pt can not lift more than 10 lbs Restrictions Weight Bearing Restrictions: No Other Position/Activity Restrictions: No lifting more than 10 lbs secondary to CSF leak in the past General: PT Amount of Missed Time (min): 20 Minutes PT Missed Treatment Reason: Patient fatigue   Therapy/Group: Individual Therapy  Breck Coons, PT, DPT 06/26/2021, 12:13 PM

## 2021-06-26 NOTE — Progress Notes (Signed)
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 She has Lisa 52.50 pack-year smoking history. She has never used smokeless tobacco. She reports that she does not drink alcohol and does not use drugs.  Family / Support Systems Marital Status: Married How Long?: 50 years Patient Roles: Spouse, Parent Spouse/Significant Other: Lisa [name] (husband) [phone] Children: adult son (74) and dtr (68). Other Supports: dtr Anticipated Caregiver: Husband and dtr Ability/Limitations of Caregiver: Husband travels back/forth from [place] since they own Lisa juice shop and stays over for Lisa few days. Dtr works in the morning 8am-12pm and cares for her 3 children. Caregiver Availability: Intermittent Family Dynamics: Patient lives with her husband, and their dtr and three grandchildren.  Social History Preferred language: English Religion: Christian Cultural Background: Pt worked as Lisa Therapist, sports  for 3-5 years, and is self-employed. Education: BA in [place] - How often do you need to have someone help you when you read instructions, pamphlets, or other written material from your doctor or pharmacy?: Never Writes: Yes Employment Status: Employed Name of Employer: Self-employed; owns juice shop in [place] Return to Work Plans: TBD Public relations account executive Issues: Denies Guardian/Conservator: N/Lisa   Abuse/Neglect Abuse/Neglect Assessment Can Be Completed: Yes Physical Abuse: Denies Verbal Abuse: Denies Sexual Abuse: Denies Exploitation of patient/patient's resources: Denies Self-Neglect: Denies  Patient response to: Social Isolation - How often do you feel lonely or isolated from those around you?: Never  Emotional Status Pt's affect, behavior and adjustment status: Pt in good spirits at time of visit. Recent Psychosocial Issues: Denies any current Psychiatric History: Pt reports that she goes to [facility] counseling for depression/anxiety; receives medication management with NP and has Lisa  therapist here as well. Substance Abuse History: Pt admits she was smoking up until this admission; 1/2-1ppd for hte last 40 years. Denies etoh/rec use.  Patient / Family Perceptions, Expectations & Goals Pt/Family understanding of illness & functional limitations: Pt and family have Lisa general understanding of pt care needs. Premorbid pt/family roles/activities: Independent Anticipated changes in roles/activities/participation: Assistance with ADLs/IADLs Pt/family expectations/goals: Pt goal is to continue to work on exercisign her arms and build up strength in legs.  Community Resources Express Scripts: None Premorbid Home Care/DME Agencies: None Transportation available at discharge: Husband Is the patient able to respond to transportation needs?: Yes In the past 12 months, has lack of  transportation kept you from medical appointments or from getting  medications?: No In the past 12 months, has lack of transportation kept you from meetings, work, or from getting things needed for daily living?: No Resource referrals recommended: Neuropsychology  Discharge Planning Living Arrangements: Spouse/significant other, Children, Other relatives Support Systems: Spouse/significant other, Children Type of Residence: Private residence Insurance Resources: Multimedia programmer (specify) Psychologist, counselling) Financial Resources: Fish farm manager, Employment Financial Screen Referred: No Living Expenses: Medical laboratory scientific officer Management: Spouse Does the patient have any problems obtaining your medications?: No Home Management: Dtr helps prepare meals and does housekeeping. Patient/Family Preliminary Plans: No changes Care Coordinator Barriers to Discharge: Decreased caregiver support, Lack of/limited family support Care Coordinator Anticipated Follow Up Needs: HH/OP  Clinical Impression Pt is not Lisa veteran. Husband is Lisa English as Lisa second language teacher but does not use VA benefits. No HCPOA. DME: RW, rollator, cane (3 prong), Riley, shower chair, grab bar in bathroom at tub, and uses exercise stool in tub.  Lisa [name] Lisa [name] 06/26/2021, 3:14 PM

## 2021-06-27 LAB — COMPREHENSIVE METABOLIC PANEL
ALT: 25 U/L (ref 0–44)
AST: 17 U/L (ref 15–41)
Albumin: 3.2 g/dL — ABNORMAL LOW (ref 3.5–5.0)
Alkaline Phosphatase: 51 U/L (ref 38–126)
Anion gap: 9 (ref 5–15)
BUN: 14 mg/dL (ref 8–23)
CO2: 26 mmol/L (ref 22–32)
Calcium: 9.1 mg/dL (ref 8.9–10.3)
Chloride: 100 mmol/L (ref 98–111)
Creatinine, Ser: 0.62 mg/dL (ref 0.44–1.00)
GFR, Estimated: >60 mL/min (ref >60)
Glucose, Bld: 138 mg/dL — ABNORMAL HIGH (ref 70–99)
Potassium: 4.2 mmol/L (ref 3.5–5.1)
Sodium: 135 mmol/L (ref 135–145)
Total Bilirubin: 0.5 mg/dL (ref 0.3–1.2)
Total Protein: 6.5 g/dL (ref 6.5–8.1)

## 2021-06-27 LAB — CBC
HCT: 38.8 % (ref 36.0–46.0)
Hemoglobin: 13.3 g/dL (ref 12.0–15.0)
MCH: 31.4 pg (ref 26.0–34.0)
MCHC: 34.3 g/dL (ref 30.0–36.0)
MCV: 91.7 fL (ref 80.0–100.0)
Platelets: 422 10*3/uL — ABNORMAL HIGH (ref 150–400)
RBC: 4.23 MIL/uL (ref 3.87–5.11)
RDW: 14.5 % (ref 11.5–15.5)
WBC: 6.5 10*3/uL (ref 4.0–10.5)
nRBC: 0 % (ref 0.0–0.2)

## 2021-06-27 LAB — GLUCOSE, CAPILLARY
Glucose-Capillary: 134 mg/dL — ABNORMAL HIGH (ref 70–99)
Glucose-Capillary: 115 mg/dL — ABNORMAL HIGH (ref 70–99)
Glucose-Capillary: 163 mg/dL — ABNORMAL HIGH (ref 70–99)
Glucose-Capillary: 186 mg/dL — ABNORMAL HIGH (ref 70–99)

## 2021-06-27 NOTE — Progress Notes (Signed)
Patient ID: Lisa [name], female   DOB: 1960-10-21, 61 y.o.   MRN: [id number]  Pt reports she scheduled new pt appt with Dr. Collene Leyden with [facility].   [name], MSW, [place] Office: [phone] Cell: [phone] Fax: [phone]

## 2021-06-27 NOTE — Progress Notes (Signed)
Physical Therapy Session Note  Patient Details  Name: Lisa [name] MRN: [id number] Date of Birth: 10-25-1960  Today's Date: 06/27/2021 PT Individual Time: 7371-0626 PT Individual Time Calculation (min): 54 min   Short Term Goals: Week 1:  PT Short Term Goal 1 (Week 1): Pt will perform rolling in bed from side to side for pressure relief with overall CGA. PT Short Term Goal 2 (Week 1): Pt will perform supine<>sit with MinA using bed features. PT Short Term Goal 3 (Week 1): Pt will perform sit<>stand to RW with MaxA +1. PT Short Term Goal 4 (Week 1): Pt will initiate gait training. PT Short Term Goal 5 (Week 1): Pt will perform stand pivot transfers with ModA.  Skilled Therapeutic Interventions/Progress Updates:     Pt received seated in [facility] and agrees to therapy. No complaint of pain. Husband present for family education. PT provides education on importance of regular ambulation at home, and recommendation for CGA/ close supervision with ambulation at this time. WC transport to gym for time management. Pt performs car transfer following PT demonstration, using RW and with CGA. Seated rest break. Pt ambulates x100' with RW and with husband providing CGA, and PT providing cues on positioning and guarding of pt. Following seated rest break, pt completes x1 5 inch step with RW and CGA, with PT cueing on optimal step sequencing for safety. Pt performs toss and catch activity with trampoline to work on core and upper body strength, tossing ball x20 times while seated in WC. Activity progressed by having pt stand with RW, then takes bilateral upper extremities and tosses ball to work on dynamic balance, 1x20 with CGA. Pt attempts overhead tosses but is unable to complete due to discomfort in R rotator cuff. Instead, pt performs x20 overhead tosses while seated with no complaint of pain. Pt left seated in WC with alarm intact and all needs within reach.  Therapy Documentation Precautions:   Precautions Precautions: Fall Precaution Comments: hx of back and R knee arthrtis, per pt can not lift more than 10 lbs Restrictions Weight Bearing Restrictions: No Other Position/Activity Restrictions: No lifting more than 10 lbs secondary to CSF leak in the past   Therapy/Group: Individual Therapy  Breck Coons, PT, DPT 06/27/2021, 3:40 PM

## 2021-06-27 NOTE — Progress Notes (Signed)
PROGRESS NOTE   Subjective/Complaints: Pt up in w/c. No new issues. A little tired from therapy yesterday  ROS: Patient denies fever, rash, sore throat, blurred vision, nausea, vomiting, diarrhea, cough, shortness of breath or chest pain, joint or back pain, headache, or mood change.     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 COMPARISON:  Recent prior MRIs FINDINGS: Brain: Punctate foci of diffusion hyperintensity along the tentorial incisura (series 2, images 22-24). This is in not seen on the coronal sequence. Possible trace dependent diffusion hyperintensity in the occipital horns and fourth ventricle no longer identified. A punctate focus of diffusion hyperintensity in the right temporal-occipital region appears to be extraventricular (series 2, image 22). There is no evidence of intracranial hemorrhage. There is no intracranial mass, mass effect, or edema. Ventricles and sulci are stable in size and configuration. There is no hydrocephalus or extra-axial fluid collection. Vascular: Major vessel flow voids at the skull base are preserved. Skull and upper cervical spine: Normal marrow signal is preserved. Sinuses/Orbits: Minor mucosal thickening.  Orbits are unremarkable. Other: Sella is unremarkable.  Persistent mastoid effusions. IMPRESSION: Punctate foci of diffusion hyperintensity along the tentorial incisura are probably artifactual. Less likely could reflect trace subdural blood products. Suspected trace abnormal signal within the ventricles on the prior study is no longer identified. A punctate focus of diffusion hyperintensity in the right temporal-occipital region appears to be  extra-articular and could reflect a tiny subacute infarct. Persistent nonspecific mastoid effusions. Electronically Signed   By: Macy Mis M.D.   On: 06/25/2021 13:55   Recent Labs    06/27/21 0531  WBC 6.5  HGB 13.3  HCT 38.8  PLT 422*   Recent Labs    06/27/21 0531  NA 135  K 4.2  CL 100  CO2 26  GLUCOSE 138*  BUN 14  CREATININE 0.62  CALCIUM 9.1    Intake/Output Summary (Last 24 hours) at 06/27/2021 5631 Last data filed at 06/27/2021 0805 Gross per 24 hour  Intake 1160 ml  Output --  Net 1160 ml     Pressure Injury 06/12/21 Sacrum Left;Upper Deep Tissue Pressure Injury - Purple or maroon localized area of discolored intact skin or blood-filled blister due to damage of underlying soft tissue from pressure and/or shear. (Active)  06/12/21 1644  Location: Sacrum  Location Orientation: Left;Upper  Staging: Deep Tissue Pressure Injury - Purple or maroon localized area of discolored intact skin or blood-filled blister due to damage of underlying soft tissue from pressure and/or shear.  Wound Description (Comments):   Present on Admission: No     Pressure Injury 06/12/21 Sacrum Mid Stage 1 -  Intact skin with non-blanchable redness of a localized area usually over a bony prominence. (Active)  06/12/21 2000  Location: Sacrum  Location Orientation: Mid  Staging: Stage 1 -  Intact skin with non-blanchable redness of a localized area usually over a bony prominence.  Wound Description (Comments):   Present on Admission: Yes  Physical Exam: Vital Signs Blood pressure 122/66, pulse 83, temperature 97.9 F (36.6 C), resp. rate 18, height 5\' 6"  (1.676 m), weight 119.7 kg, last menstrual period 11/02/2013, SpO2 99 %.   Constitutional: No distress . Vital signs reviewed. obese HEENT: NCAT, EOMI, oral membranes moist Neck: supple Cardiovascular: RRR without murmur. No JVD    Respiratory/Chest: CTA Bilaterally without wheezes or rales. Normal effort    GI/Abdomen: BS +,  non-tender, non-distended Ext: no clubbing, cyanosis, or edema Psych: pleasant and cooperative  Skin:: sacral wounds dressed Neuro:  Alert and oriented x 3. Fair insight and awareness. Intact Memory. Normal language and speech. Cranial nerve exam unremarkable. UE  strength 5/5. LLE 3-4/5 prox to 4/5 distally. RLE 1-2/5 prox (pain) to 4/5 distally. Subjective decrease in LT over dorsum of toes +/-.--no change today  Musculoskeletal: no effusion or erythema in knees. +jt line pain at knees Gyn: hard, movable nodule underneath left nipple. At least 1/2" in diameter.   Assessment/Plan: 1. Functional deficits which require 3+ hours per day of interdisciplinary therapy in a comprehensive inpatient rehab setting. Physiatrist is providing close team supervision and 24 hour management of active medical problems listed below. Physiatrist and rehab team continue to assess barriers to discharge/monitor patient progress toward functional and medical goals  Care Tool:  Bathing    Body parts bathed by patient: Right arm, Left arm, Chest, Abdomen, Right upper leg, Left upper leg, Face   Body parts bathed by helper: Front perineal area, Buttocks, Left lower leg, Right lower leg     Bathing assist Assist Level: Moderate Assistance - Patient 50 - 74%     Upper Body Dressing/Undressing Upper body dressing   What is the patient wearing?: Pull over shirt    Upper body assist Assist Level: Minimal Assistance - Patient > 75%    Lower Body Dressing/Undressing Lower body dressing    Lower body dressing activity did not occur: Safety/medical concerns What is the patient wearing?: Incontinence brief, Pants     Lower body assist Assist for lower body dressing: Moderate Assistance - Patient 50 - 74%     Toileting Toileting    Toileting assist Assist for toileting: Moderate Assistance - Patient 50 - 74%     Transfers Chair/bed transfer  Transfers assist     Chair/bed transfer assist level:  Moderate Assistance - Patient 50 - 74%     Locomotion Ambulation   Ambulation assist   Ambulation activity did not occur: Safety/medical concerns  Assist level: Minimal Assistance - Patient > 75% Assistive device: Walker-rolling Max distance: 10'   Walk 10 feet activity   Assist  Walk 10 feet activity did not occur: Safety/medical concerns        Walk 50 feet activity   Assist Walk 50 feet with 2 turns activity did not occur: Safety/medical concerns         Walk 150 feet activity   Assist Walk 150 feet activity did not occur: Safety/medical concerns         Walk 10 feet on uneven surface  activity   Assist Walk 10 feet on uneven surfaces activity did not occur: Safety/medical concerns         Wheelchair     Assist Is the patient using a wheelchair?: Yes Type of Wheelchair: Manual Wheelchair activity did not occur: Safety/medical concerns         Wheelchair 50 feet with 2 turns activity    Assist    Wheelchair 50 feet with 2  turns activity did not occur: Safety/medical concerns       Wheelchair 150 feet activity     Assist  Wheelchair 150 feet activity did not occur: Safety/medical concerns       Blood pressure 122/66, pulse 83, temperature 97.9 F (36.6 C), resp. rate 18, height 5\' 6"  (1.676 m), weight 119.7 kg, last menstrual period 11/02/2013, SpO2 99 %.  Medical Problem List and Plan: 1. Functional deficits secondary to H influenza bacteremia and meningitis in the setting of CSF leak/bilateral temporal bone abnormality             -patient may shower             -ELOS/Goals: 1/24, sup, MinA--pt has PCP appt with Collene Leyden [facility]) on 2/14            -10lb lifting restriction for objects   --Continue CIR therapies including PT, OT  2.  Antithrombotics: -DVT/anticoagulation:  Pharmaceutical: Lovenox             -antiplatelet therapy: Aspirin 81 mg daily 3. Bilateral knee pain R>L. 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  -Ice to bilateral knees   4. Mood: Wellbutrin 300 mg daily, Zoloft 100 mg daily, Xanax 0.25 mg twice daily as needed  -mood seems appropriate. Husband is supportive             -antipsychotic agents: N/A 5. Neuropsych: This patient is capable of making decisions on her own behalf. 6. Skin/Wound Care: Routine skin checks 7. Fluids/Electrolytes/Nutrition: encourage appropriate po intake  -protein supp for low albumin 8.  ID.  IV ceftriaxone 2 g every 12 hours as well as Flagyl 500 mg 3 times daily through 06/25/2021. --COMPLETE  -1/17 MRI demonstrated improvement, no abscesses -continue protonix and prn zofran for nausea  -cbc wnl today 9.  Hypertension.   continue Lisinopril 5 mg daily.     1/8 well controlled 10.  Diabetes mellitus.  Hemoglobin A1c 6.6.  SSI.   -Patient on Lantus insulin 10 units nightly PRIOR TO ADMISSION as well as Victoza 1.2 mg dose daily.    CBG (last 3)  Recent Labs    06/26/21 1621 06/26/21 2114 06/27/21 0633  GLUCAP 112* 202* 134*  1/18 resumes lantus at 5u qhs--observe today's numbers 11.  Tobacco abuse.  NicoDerm patch.  Provide counseling 12.  Right shoulder pain.  MRI right shoulder mild distal supraspinatus and infraspinatus tendinosis.  Focal high-grade partial width tearing of the far anterior fibers of the supraspinatus tendon.  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   -pain is improved   -Conservative care, rom, strengthening with therapy - continue Voltaren gel 13.  Obesity.  BMI 43.95.  Dietary follow-up. Caregiver training on ergonomic assistance techniques as she will likely continue to require some assistance upon discharge 14. Mild transaminitis  -LFT's remain slightly elevated but not too different from 1/5  -I personally reviewed the patient's labs today.     -resolved 15. Breast nodule:  -PA is working on mammogram at  breast center.     LOS: 7 days A FACE TO FACE EVALUATION WAS PERFORMED  Meredith Staggers 06/27/2021, 9:52 AM

## 2021-06-27 NOTE — Evaluation (Incomplete)
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 right-handed female with history of diabetes mellitus hypertension, hyperlipidemia and tobacco use as well as temporal bone abnormality leading to intermittent CSF leakage with failed attempt to close CSF leak in 2005 and patient has been followed at [facility] as well as [facility].  Per chart review patient lives with spouse.  1 level home one-step to entry.  Independent prior to admission with occasional cane.  Presented 06/11/2021 with altered mental status and fever.  She was placed on broad-spectrum antibiotics.  Cranial CT scan showed no acute intracranial abnormality.  Complete opacification of both mastoid air cells.  MRI of the brain diffuse leptomeningeal enhancement involving both cerebral hemispheres, highly suggestive of acute meningitis.   Patient did require intubation for airway protection and extubated 06/14/2021.  Lumbar puncture with 6004-25 white blood cells 84% neutrophils and abundant white blood cells but no organisms initially seen, protein 283 glucose 32.  Infectious disease consulted as well as neurology for H influenza bacteremia and meningitis in the setting of CSF leak.  Currently completing a 14-day course of IV ceftriaxone 2 g every 12 hours as well as IV/p.o. Flagyl 500 mg 3 times daily ending 06/25/2021.  Hospital course she did initially require Precedex for agitation.  Recommendations are repeat MRI of the brain before completion of antibiotic course per Dr. Drucilla Schmidt.  Lovenox initiated for DVT prophylaxis.  Currently on mechanical soft diet.  Therapy evaluations completed due to patient decreased functional ability altered mental status was admitted for a comprehensive rehab program. She complaints of right knee pain. .  Patient transferred to CIR on 06/20/2021 .    Pt presents with decreased activity tolerance, decreased functional mobility, decreased balance Limiting pt's independence with leisure/community pursuits.     Recommendations for other services: None   Discharge Criteria: Patient will be discharged from TR if patient refuses treatment 3 consecutive times without medical reason.  If treatment goals not met, if there is a change in medical status, if patient makes no progress towards goals or if patient is discharged from hospital.  The above assessment, treatment plan, treatment alternatives and goals were discussed and mutually agreed upon: by patient  [place] 06/27/2021, 4:14 PM

## 2021-06-27 NOTE — Progress Notes (Signed)
Occupational Therapy Session Note  Patient Details  Name: Lisa [name] MRN: [id number] Date of Birth: 24-Apr-1961  Today's Date: 06/27/2021 Session 1 OT Individual Time: 0950-1100 OT Individual Time Calculation (min): 70 min   Session 2 OT Individual Time: 1333-1430 OT Individual Time Calculation (min): 57 min   Short Term Goals: Week 1:  OT Short Term Goal 1 (Week 1): Pt will be able to rise to stand with RW with mod A of 1 in prep for toileting. OT Short Term Goal 2 (Week 1): Pt will be able to stand pivot to [facility] with RW with mod A of 1. OT Short Term Goal 3 (Week 1): Pt will don pants over feet with use of a reacher to avoid back pain. OT Short Term Goal 4 (Week 1): Pt will be able to wash feet with long handled sponge.  Skilled Therapeutic Interventions/Progress Updates:  Session 1   Pt greeted seated in wc and agreeable to OT treatment session. Pt declined need for any BADL tasks this morning. Pt brought to therapy gym in wc and completed stand-pivot to NuStep with RW and CGA. Pt completed 13 minutes on NuStep on level 3 with min cues to decrease hip external rotation and no rest break. Addressed sit<>stands and standing balance/endurance with standing slide board puzzle. Pt needed CGA to get to standing and CGA for balance. Pt needed frequent verbal and tactile cues to maintain hip and trunk extension as she would start to bend down, Pt reported this was due to her back. Pt tolerated 3 minutes at longest bout, then only 1.5-2 minutes on the last two bouts. UB there-ex using 1 lb dowel rod. 4 sets of 10 chest press, bicep curl, and straight arm raise. Pt returned to room and completed grooming tasks with set-up  A. PT left seated in wc with call bell in reach and needs met.   Session 2 Pt greeted seated EOB with spouse present for family education. Educated spouse on sit<>stands w/ RW, toilet transfers, and how patient is doing with BADL tasks. Pt needed 2 trials to stand, but able to  complete with RW and CGA. CGA to pivot to wc w/ RW. PT brought down to tub room and discussed home bathroom set-up with pt and spouse. OT made leg lfted out of gait belt and practiced lifting LE's over high tub ledge with supervision. Pt reported her tub ledge was very high and concerned about lifting legs over. Pt adament that it would be easier for her to step over.  Trialed one sit<>stand and attempted stepping over, but pt unable to get R LE high enough to get over ledge and OT expressed this would increase fall risk. Pt returned to sitting and able to scoot from tub bench over ledge using leg lifter again and no physical assist. OT educated on use of reacher and sock aid as well for increased independence with LB dressing. Pt ambulated 20 feet w/ RW and spouse providing close supervision. Pt returned to room and reported need to go to the  bathroom. Pt ambulated into bathroom w/ RW and close supervision. Pt voided bowel and bladder, then completed toileting steps with close supervision. Pt returned to wc and left seated in wc with spouse present and needs met.   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

## 2021-06-28 DIAGNOSIS — C50919 Malignant neoplasm of unspecified site of unspecified female breast: Secondary | ICD-10-CM

## 2021-06-28 HISTORY — DX: Malignant neoplasm of unspecified site of unspecified female breast: C50.919

## 2021-06-28 LAB — GLUCOSE, CAPILLARY: Glucose-Capillary: 139 mg/dL — ABNORMAL HIGH (ref 70–99)

## 2021-06-28 NOTE — Progress Notes (Signed)
Occupational Therapy Weekly Progress Note  Patient Details  Name: Lisa [name] MRN: [id number] Date of Birth: [birth year]  Beginning of progress report period: June 21, 2021 End of progress report period: June 28, 2021  Today's Date: 06/28/2021 OT Individual Time: 5400-8676 OT Individual Time Calculation (min): 44 min    Patient has met 4 of 4 short term goals.  Patient is making steady progress towards OT goals. Pt is at a CGA/min A level for all functional BADL  tasks, sit<>stands, and transfers. Patient continues to have low activity tolerance, but this is improving as well. Pt on target to meet supervision goals by dc date next week. Continue current POC.  Patient continues to demonstrate the following deficits: muscle weakness, decreased cardiorespiratoy endurance, and decreased standing balance and decreased balance strategies and therefore will continue to benefit from skilled OT intervention to enhance overall performance with BADL.  Patient progressing toward long term goals..  Continue plan of care.  OT Short Term Goals Week 1:  OT Short Term Goal 1 (Week 1): Pt will be able to rise to stand with RW with mod A of 1 in prep for toileting. OT Short Term Goal 1 - Progress (Week 1): Met OT Short Term Goal 2 (Week 1): Pt will be able to stand pivot to [facility] with RW with mod A of 1. OT Short Term Goal 2 - Progress (Week 1): Met OT Short Term Goal 3 (Week 1): Pt will don pants over feet with use of a reacher to avoid back pain. OT Short Term Goal 3 - Progress (Week 1): Met OT Short Term Goal 4 (Week 1): Pt will be able to wash feet with long handled sponge. OT Short Term Goal 4 - Progress (Week 1): Met Week 2:  OT Short Term Goal 1 (Week 2): LTG=STG 2/2 ELOS  Skilled Therapeutic Interventions/Progress Updates:  Session 1   Pt greeted seated EOB and agreeable to OT treatment session. Pt wanted to shower today. Pt needed 2 trials to get to standing with CGA. Pt ambulated to  bathroom and onto [facility] over toilet with CGA. PT voided bladder and completed 3/3 toileting steps with close supervision. Pt ambulated short distance to tub bench with L knee buckling during side stepping requiring mod A to correct. Bathing completed from tub bench using LH sponge with CGA when standing to wash buttocks. Pt transferred out of shower with CGA. Dressing tasks completed from wc with supervision using figure 4 position to thread pants, underwear, shoes and TED hose. Grooming tasks completed from wc at the sink. 10 minutes completed on UE Ergometer on level 2. Pt returned to room and left seated in wc with alarm belt on, call bell in reach, and needs met.  Pain: Pain Assessment Pain Scale: 0-10 Pain Score: 5 Pain Type: Chronic pain Pain Location: Generalized Pain Intervention(s): Repositioned  Session 2 Pt greeted seated in wc and agreeable to OT treatment session. Pt reported need to go to the bathroom. Pt completed sit<>stand from wc wih RW and CGA. CGA to ambulate to [facility] placed over toilet. Supervision for clothing management. Pt voided bladder with successful BM. Pt completed her own peri-care and palced barrier cream on buttocks. Pt brought down to therapy gym in wc for time management. Hip strengthening OTE[place] exercises completed standing outside of parallel bars with mirror feedback to maintain upright posture. Pt then performed side steps along parallel bars. Standing unilateral arm raise with back bend for back pain management. Progressed from unilateral  to bilateral arm raise and back bend with CGA for balance. Pt returned to room and left seated in wc with alarm belt on, call bell in reach, and needs met.    Therapy Documentation Precautions:  Precautions Precautions: Fall Precaution Comments: hx of back and R knee arthrtis, per pt can not lift more than 10 lbs Restrictions Weight Bearing Restrictions: No Other Position/Activity Restrictions: No lifting more than 10 lbs  secondary to CSF leak in the past Pain: Pain Assessment Pain Scale: 0-10 Pain Score: 5 Pain Type: Chronic pain Pain Location: Generalized Pain Intervention(s): Repositioned   Therapy/Group: Individual Therapy  Valma Cava 06/28/2021, 7:54 AM

## 2021-06-28 NOTE — Progress Notes (Signed)
Occupational Therapy Session Note  Patient Details  Name: Lisa [name] MRN: [id number] Date of Birth: Jul 19, 1960  Today's Date: 06/28/2021 OT Individual Time: 0930-1000 OT Individual Time Calculation (min): 30 min    Short Term Goals: Week 1:  OT Short Term Goal 1 (Week 1): Pt will be able to rise to stand with RW with mod A of 1 in prep for toileting. OT Short Term Goal 1 - Progress (Week 1): Met OT Short Term Goal 2 (Week 1): Pt will be able to stand pivot to [facility] with RW with mod A of 1. OT Short Term Goal 2 - Progress (Week 1): Met OT Short Term Goal 3 (Week 1): Pt will don pants over feet with use of a reacher to avoid back pain. OT Short Term Goal 3 - Progress (Week 1): Met OT Short Term Goal 4 (Week 1): Pt will be able to wash feet with long handled sponge. OT Short Term Goal 4 - Progress (Week 1): Met  Skilled Therapeutic Interventions/Progress Updates:    Pt received in wc dressed and ready for the day. She stated she was hesitant to work on standing as her L knee was uncomfortable from the weight bearing from earlier sessions.  Used this session to focus on teaching pt exercises for her R shoulder focusing on RTC as she has had pain with overhead lifting.   Provided pt with a level 1 theraband in a loop: -double arm abduction pushing band out at various sh angles -external rotation - arm rows for sh retraction   Pt did well with exercises.  Resting in room with all needs met.    Therapy Documentation Precautions:  Precautions Precautions: Fall Precaution Comments: hx of back and R knee arthrtis, per pt can not lift more than 10 lbs Restrictions Weight Bearing Restrictions: No Other Position/Activity Restrictions: No lifting more than 10 lbs secondary to CSF leak in the past   Pain:  pt stated her L knee was sore due to overuse, so rested her legs this session     Therapy/Group: Individual Therapy  Naelani Lafrance 06/28/2021, 12:35 PM

## 2021-06-28 NOTE — Progress Notes (Signed)
PROGRESS NOTE   Subjective/Complaints: Pt working with OT. No new complaints today. Had questions as to who would help her with basic medical until she saw her new PCP in February  ROS: Patient denies fever, rash, sore throat, blurred vision, nausea, vomiting, diarrhea, cough, shortness of breath or chest pain,  headache, or mood change.   Objective:   No results found. Recent Labs    06/27/21 0531  WBC 6.5  HGB 13.3  HCT 38.8  PLT 422*   Recent Labs    06/27/21 0531  NA 135  K 4.2  CL 100  CO2 26  GLUCOSE 138*  BUN 14  CREATININE 0.62  CALCIUM 9.1    Intake/Output Summary (Last 24 hours) at 06/28/2021 0911 Last data filed at 06/28/2021 7681 Gross per 24 hour  Intake 1080 ml  Output --  Net 1080 ml     Pressure Injury 06/12/21 Sacrum Left;Upper Deep Tissue Pressure Injury - Purple or maroon localized area of discolored intact skin or blood-filled blister due to damage of underlying soft tissue from pressure and/or shear. (Active)  06/12/21 1644  Location: Sacrum  Location Orientation: Left;Upper  Staging: Deep Tissue Pressure Injury - Purple or maroon localized area of discolored intact skin or blood-filled blister due to damage of underlying soft tissue from pressure and/or shear.  Wound Description (Comments):   Present on Admission: No     Pressure Injury 06/12/21 Sacrum Mid Stage 1 -  Intact skin with non-blanchable redness of a localized area usually over a bony prominence. (Active)  06/12/21 2000  Location: Sacrum  Location Orientation: Mid  Staging: Stage 1 -  Intact skin with non-blanchable redness of a localized area usually over a bony prominence.  Wound Description (Comments):   Present on Admission: Yes    Physical Exam: Vital Signs Blood pressure 114/70, pulse 85, temperature 97.9 F (36.6 C), resp. rate 16, height 5\' 6"  (1.676 m), weight 119.7 kg, last menstrual period 11/02/2013, SpO2 97  %.   Constitutional: No distress . Vital signs reviewed. obese HEENT: NCAT, EOMI, oral membranes moist Neck: supple Cardiovascular: RRR without murmur. No JVD    Respiratory/Chest: CTA Bilaterally without wheezes or rales. Normal effort    GI/Abdomen: BS +, non-tender, non-distended Ext: no clubbing, cyanosis, or edema Psych: pleasant and cooperative  Skin:: sacral wounds dressed Neuro:  Alert and oriented x 3. Fair insight and awareness. Intact Memory. Normal language and speech. Cranial nerve exam unremarkable. UE  strength 5/5. LLE 3-4/5 prox to 4/5 distally. RLE 1-2/5 prox (pain) to 4/5 distally. Subjective decrease in LT over dorsum of toes +/-.stable appearance  Musculoskeletal: no effusion or erythema in knees. +jt line pain at knees Gyn: persistent hard, movable nodule underneath left nipple. At least 1/2" in diameter.   Assessment/Plan: 1. Functional deficits which require 3+ hours per day of interdisciplinary therapy in a comprehensive inpatient rehab setting. Physiatrist is providing close team supervision and 24 hour management of active medical problems listed below. Physiatrist and rehab team continue to assess barriers to discharge/monitor patient progress toward functional and medical goals  Care Tool:  Bathing    Body parts bathed by patient: Right arm, Left arm,  Chest, Front perineal area, Abdomen, Face, Left lower leg, Right lower leg, Left upper leg, Right upper leg, Buttocks   Body parts bathed by helper: Front perineal area, Buttocks, Left lower leg, Right lower leg     Bathing assist Assist Level: Contact Guard/Touching assist     Upper Body Dressing/Undressing Upper body dressing   What is the patient wearing?: Pull over shirt    Upper body assist Assist Level: Minimal Assistance - Patient > 75%    Lower Body Dressing/Undressing Lower body dressing    Lower body dressing activity did not occur: Safety/medical concerns What is the patient wearing?:  Underwear/pull up, Pants     Lower body assist Assist for lower body dressing: Contact Guard/Touching assist     Toileting Toileting    Toileting assist Assist for toileting: Contact Guard/Touching assist     Transfers Chair/bed transfer  Transfers assist     Chair/bed transfer assist level: Contact Guard/Touching assist     Locomotion Ambulation   Ambulation assist   Ambulation activity did not occur: Safety/medical concerns  Assist level: Minimal Assistance - Patient > 75% Assistive device: Walker-rolling Max distance: 10'   Walk 10 feet activity   Assist  Walk 10 feet activity did not occur: Safety/medical concerns        Walk 50 feet activity   Assist Walk 50 feet with 2 turns activity did not occur: Safety/medical concerns         Walk 150 feet activity   Assist Walk 150 feet activity did not occur: Safety/medical concerns         Walk 10 feet on uneven surface  activity   Assist Walk 10 feet on uneven surfaces activity did not occur: Safety/medical concerns         Wheelchair     Assist Is the patient using a wheelchair?: Yes Type of Wheelchair: Manual Wheelchair activity did not occur: Safety/medical concerns         Wheelchair 50 feet with 2 turns activity    Assist    Wheelchair 50 feet with 2 turns activity did not occur: Safety/medical concerns       Wheelchair 150 feet activity     Assist  Wheelchair 150 feet activity did not occur: Safety/medical concerns       Blood pressure 114/70, pulse 85, temperature 97.9 F (36.6 C), resp. rate 16, height 5\' 6"  (1.676 m), weight 119.7 kg, last menstrual period 11/02/2013, SpO2 97 %.  Medical Problem List and Plan: 1. 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 I told her we would help with basic medical needs at [facility] until then            -10lb lifting restriction for objects   -Continue CIR therapies including PT, OT  2.  Antithrombotics: -DVT/anticoagulation:  Pharmaceutical: Lovenox             -antiplatelet therapy: Aspirin 81 mg daily 3. Bilateral knee pain R>L. 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  -Ice to bilateral knees   -1/20 ?benefit from knee brace for stability?  Discussed with therapy. Will hold off for now 4. Mood: Wellbutrin  300 mg daily, Zoloft 100 mg daily, Xanax 0.25 mg twice daily as needed  -mood seems appropriate. Husband is supportive             -antipsychotic agents: N/A 5. Neuropsych: This patient is capable of making decisions on her own behalf. 6. Skin/Wound Care: Routine skin checks 7. Fluids/Electrolytes/Nutrition: encourage appropriate po intake  -protein supp for low albumin 8.  ID.  IV ceftriaxone 2 g every 12 hours as well as Flagyl 500 mg 3 times daily through 06/25/2021. --COMPLETE  -1/17 MRI demonstrated improvement, no abscesses -continue protonix and prn zofran for nausea  -cbc wnl   9.  Hypertension.   continue Lisinopril 5 mg daily.     1/20 well controlled 10.  Diabetes mellitus.  Hemoglobin A1c 6.6.  SSI.   -Patient on Lantus insulin 10 units nightly PRIOR TO ADMISSION as well as Victoza 1.2 mg dose daily.    CBG (last 3)  Recent Labs    06/27/21 1653 06/27/21 2105 06/28/21 0606  GLUCAP 163* 186* 139*  -previously resumed lantus at 5u qhs--will increase to 10u qhs 1/20 11.  Tobacco abuse.  NicoDerm patch.  Provide counseling 12.  Right shoulder pain.  MRI right shoulder mild distal supraspinatus and infraspinatus tendinosis.  Focal high-grade partial width tearing of the far anterior fibers of the supraspinatus tendon.  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   -pain is improved    -Conservative care, rom, strengthening with therapy - continue Voltaren gel 13.  Obesity.  BMI 43.95.  Dietary follow-up. Caregiver training on ergonomic assistance techniques as she will likely continue to require some assistance upon discharge 14. Mild transaminitis  -LFT's remain slightly elevated but not too different from 1/5  -resolved 15. Breast nodule:  -PA is working on mammogram at breast center.     LOS: 8 days A FACE TO FACE EVALUATION WAS PERFORMED  Meredith Staggers 06/28/2021, 9:11 AM

## 2021-06-28 NOTE — Progress Notes (Signed)
Physical Therapy Weekly Progress Note  Patient Details  Name: Lisa [name] MRN: [id number] Date of Birth: Oct 13, 1960  Beginning of progress report period: June 21, 2021 End of progress report period: June 28, 2021  Today's Date: 06/28/2021 PT Individual Time: 1120-1200 PT Individual Time Calculation (min): 40 min   Patient has met 5 of 5 short term goals.  Pt is making excellent progress toward mobility goals, improving independence with bed mobility, balance, transfers, and ambulation. Pt is performing all mobility at CGA level and ambulating up to 100' with RW. Pt's husband has attended family education. Pt will benefit form additional strengthening and mobility training prior to DC next week.  Patient continues to demonstrate the following deficits muscle weakness, decreased cardiorespiratoy endurance, and decreased standing balance and decreased balance strategies and therefore will continue to benefit from skilled PT intervention to increase functional independence with mobility.  Patient progressing toward long term goals..  Continue plan of care.  PT Short Term Goals Week 1:  PT Short Term Goal 1 (Week 1): Pt will perform rolling in bed from side to side for pressure relief with overall CGA. PT Short Term Goal 1 - Progress (Week 1): Met PT Short Term Goal 2 (Week 1): Pt will perform supine<>sit with MinA using bed features. PT Short Term Goal 2 - Progress (Week 1): Met PT Short Term Goal 3 (Week 1): Pt will perform sit<>stand to RW with MaxA +1. PT Short Term Goal 3 - Progress (Week 1): Met PT Short Term Goal 4 (Week 1): Pt will initiate gait training. PT Short Term Goal 4 - Progress (Week 1): Met PT Short Term Goal 5 (Week 1): Pt will perform stand pivot transfers with ModA. PT Short Term Goal 5 - Progress (Week 1): Met Week 2:  PT Short Term Goal 1 (Week 2): STGs = LTGs  Skilled Therapeutic Interventions/Progress Updates:  Ambulation/gait training;Community  reintegration;DME/adaptive equipment instruction;Neuromuscular re-education;Psychosocial support;Stair training;UE/LE Strength taining/ROM;Wheelchair propulsion/positioning;UE/LE Coordination activities;Therapeutic Activities;Skin care/wound management;Functional electrical stimulation;Discharge planning;Balance/vestibular training;Pain management;Cognitive remediation/compensation;Disease management/prevention;Functional mobility training;Patient/family education;Therapeutic Exercise;Visual/perceptual remediation/compensation   Pt received seated in WC and agrees to therapy. Reports some pain in L knee and R shoulder, attributing it to rotator cuff pain. PT provides mobility and rest breaks to manage pain. WC transport to gym for time management. Pt performs sit to stand with CGA and cues for body mechanics. Stand step transfer to Nustep with CGA and cues for positioning. Pt completes nustep for strength and endurance training with low impact so as not to aggravate L knee and to assist with rotator cuff strengthening. Pt completes at workload of 5 for 15:00 with average steps per minute ~50. Stand step transfer back to [facility] with CGA and cues for anterior weight shifting and increased eccentric control of stand to sit for safety. Pt performs standing alternating toe tapping on 6 inch step with bilateral upper extremity support, for balance training and strengthening of contralateral hip flexors. 2x10 with cues for posture and maintaining adequate BOS for safety and balance. Seated rest break in between bouts. WC transport back to room. Left seated with alarm intact and all needs within reach.  Therapy Documentation Precautions:  Precautions Precautions: Fall Precaution Comments: hx of back and R knee arthrtis, per pt can not lift more than 10 lbs Restrictions Weight Bearing Restrictions: No Other Position/Activity Restrictions: No lifting more than 10 lbs secondary to CSF leak in the  past   Therapy/Group: Individual Therapy  Breck Coons, PT, DPT 06/28/2021, 4:37 PM

## 2021-06-29 LAB — GLUCOSE, CAPILLARY: Glucose-Capillary: 129 mg/dL — ABNORMAL HIGH (ref 70–99)

## 2021-06-29 MED ORDER — RISAQUAD PO CAPS
1.0000 | ORAL_CAPSULE | Freq: Every day | ORAL | Status: DC
  Administered 2021-06-29 – 2021-07-02 (×4): 1 via ORAL
  Filled 2021-06-29 (×4): qty 1

## 2021-06-29 NOTE — Progress Notes (Signed)
PROGRESS NOTE   Subjective/Complaints: She asks to be allowed to be more independent as she does not have therapy today, feels stiff in her chair, and does not like to have to ask staff for help to use bathroom- she notes she is going home Tuesday  ROS: Patient denies fever, rash, sore throat, blurred vision, nausea, vomiting, diarrhea, cough, shortness of breath or chest pain,  headache, or mood change.   Objective:   No results found. Recent Labs    06/27/21 0531  WBC 6.5  HGB 13.3  HCT 38.8  PLT 422*   Recent Labs    06/27/21 0531  NA 135  K 4.2  CL 100  CO2 26  GLUCOSE 138*  BUN 14  CREATININE 0.62  CALCIUM 9.1    Intake/Output Summary (Last 24 hours) at 06/29/2021 1346 Last data filed at 06/29/2021 0830 Gross per 24 hour  Intake 840 ml  Output --  Net 840 ml     Pressure Injury 06/12/21 Sacrum Left;Upper Deep Tissue Pressure Injury - Purple or maroon localized area of discolored intact skin or blood-filled blister due to damage of underlying soft tissue from pressure and/or shear. (Active)  06/12/21 1644  Location: Sacrum  Location Orientation: Left;Upper  Staging: Deep Tissue Pressure Injury - Purple or maroon localized area of discolored intact skin or blood-filled blister due to damage of underlying soft tissue from pressure and/or shear.  Wound Description (Comments):   Present on Admission: No     Pressure Injury 06/12/21 Sacrum Mid Stage 1 -  Intact skin with non-blanchable redness of a localized area usually over a bony prominence. (Active)  06/12/21 2000  Location: Sacrum  Location Orientation: Mid  Staging: Stage 1 -  Intact skin with non-blanchable redness of a localized area usually over a bony prominence.  Wound Description (Comments):   Present on Admission: Yes    Physical Exam: Vital Signs Blood pressure 105/69, pulse 86, temperature 97.8 F (36.6 C), temperature source Oral, resp.  rate 20, height 5\' 6"  (1.676 m), weight 119.7 kg, last menstrual period 11/02/2013, SpO2 99 %. Gen: no distress, normal appearing HEENT: oral mucosa pink and moist, NCAT Cardio: Reg rate Chest: normal effort, normal rate of breathing Abd: soft, non-distended Ext: no edema Psych: pleasant, normal affect Skin:: sacral wounds dressed Neuro:  Alert and oriented x 3. Fair insight and awareness. Intact Memory. Normal language and speech. Cranial nerve exam unremarkable. UE  strength 5/5. LLE 3-4/5 prox to 4/5 distally. RLE 1-2/5 prox (pain) to 4/5 distally. Subjective decrease in LT over dorsum of toes +/-.stable appearance  Musculoskeletal: no effusion or erythema in knees. +jt line pain at knees Gyn: persistent hard, movable nodule underneath left nipple. At least 1/2" in diameter.   Assessment/Plan: 1. Functional deficits which require 3+ hours per day of interdisciplinary therapy in a comprehensive inpatient rehab setting. Physiatrist is providing close team supervision and 24 hour management of active medical problems listed below. Physiatrist and rehab team continue to assess barriers to discharge/monitor patient progress toward functional and medical goals  Care Tool:  Bathing    Body parts bathed by patient: Right arm, Left arm, Chest, Front perineal area, Abdomen,  Face, Left lower leg, Right lower leg, Left upper leg, Right upper leg, Buttocks   Body parts bathed by helper: Front perineal area, Buttocks, Left lower leg, Right lower leg     Bathing assist Assist Level: Contact Guard/Touching assist     Upper Body Dressing/Undressing Upper body dressing   What is the patient wearing?: Pull over shirt    Upper body assist Assist Level: Minimal Assistance - Patient > 75%    Lower Body Dressing/Undressing Lower body dressing    Lower body dressing activity did not occur: Safety/medical concerns What is the patient wearing?: Underwear/pull up, Pants     Lower body assist  Assist for lower body dressing: Contact Guard/Touching assist     Toileting Toileting    Toileting assist Assist for toileting: Contact Guard/Touching assist     Transfers Chair/bed transfer  Transfers assist     Chair/bed transfer assist level: Contact Guard/Touching assist     Locomotion Ambulation   Ambulation assist   Ambulation activity did not occur: Safety/medical concerns  Assist level: Minimal Assistance - Patient > 75% Assistive device: Walker-rolling Max distance: 10'   Walk 10 feet activity   Assist  Walk 10 feet activity did not occur: Safety/medical concerns        Walk 50 feet activity   Assist Walk 50 feet with 2 turns activity did not occur: Safety/medical concerns         Walk 150 feet activity   Assist Walk 150 feet activity did not occur: Safety/medical concerns         Walk 10 feet on uneven surface  activity   Assist Walk 10 feet on uneven surfaces activity did not occur: Safety/medical concerns         Wheelchair     Assist Is the patient using a wheelchair?: Yes Type of Wheelchair: Manual Wheelchair activity did not occur: Safety/medical concerns         Wheelchair 50 feet with 2 turns activity    Assist    Wheelchair 50 feet with 2 turns activity did not occur: Safety/medical concerns       Wheelchair 150 feet activity     Assist  Wheelchair 150 feet activity did not occur: Safety/medical concerns       Blood pressure 105/69, pulse 86, temperature 97.8 F (36.6 C), temperature source Oral, resp. rate 20, height 5\' 6"  (1.676 m), weight 119.7 kg, last menstrual period 11/02/2013, SpO2 99 %.  Medical Problem List and Plan: 1. 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 I told her  we would help with basic medical needs at [facility] until then            -10lb lifting restriction for objects   -Continue CIR therapies including PT, OT   Therapy notes reviewed- ambulating 100 feet CG w/ RW: placed nursing order to allow patient to walk to and from bathroom herself.  2.  Antithrombotics: -DVT/anticoagulation:  Pharmaceutical: Lovenox             -antiplatelet therapy: Aspirin 81 mg daily 3. Bilateral knee pain R>L. 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  -Ice to  bilateral knees   -1/20 ?benefit from knee brace for stability?  Discussed with therapy. Will hold off for now 4. Depression: continue Wellbutrin 300 mg daily, Zoloft 100 mg daily, Xanax 0.25 mg twice daily as needed  -mood seems appropriate. Husband is supportive             -antipsychotic agents: N/A 5. Neuropsych: This patient is capable of making decisions on her own behalf. 6. Skin/Wound Care: Routine skin checks 7. Fluids/Electrolytes/Nutrition: encourage appropriate po intake  -protein supp for low albumin 8.  ID.  IV ceftriaxone 2 g every 12 hours as well as Flagyl 500 mg 3 times daily through 06/25/2021. --COMPLETE  -1/17 MRI demonstrated improvement, no abscesses -continue protonix and prn zofran for nausea  -cbc wnl   9.  Hypertension.   continue Lisinopril 5 mg daily.     1/20 well controlled 10.  Diabetes mellitus.  Hemoglobin A1c 6.6.  SSI.   -Patient on Lantus insulin 10 units nightly PRIOR TO ADMISSION as well as Victoza 1.2 mg dose daily.    CBG (last 3)  Recent Labs    06/27/21 2105 06/28/21 0606 06/29/21 0605  GLUCAP 186* 139* 129*  -previously resumed lantus at 5u qhs--will increase to 10u qhs 1/20 1/21 CBG 129-186: continue current regimen 11.  Tobacco abuse.  NicoDerm patch.  Provide counseling 12.  Right shoulder pain.  MRI right shoulder mild distal supraspinatus and infraspinatus tendinosis.  Focal high-grade partial  width tearing of the far anterior fibers of the supraspinatus tendon.  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   -pain is improved   -Conservative care, rom, strengthening with therapy - continue Voltaren gel 13.  Obesity.  BMI 43.95.  Dietary follow-up. Caregiver training on ergonomic assistance techniques as she will likely continue to require some assistance upon discharge 14. Mild transaminitis  -LFT's remain slightly elevated but not too different from 1/5  -resolved 15. Breast nodule:  -PA is working on mammogram at breast center.     LOS: 9 days A FACE TO FACE EVALUATION WAS PERFORMED  Martha Clan P Reshonda Koerber 06/29/2021, 1:46 PM

## 2021-06-29 NOTE — Progress Notes (Signed)
Occupational Therapy Session Note  Patient Details  Name: Lisa [name] MRN: [id number] Date of Birth: Mar 31, 1961  Today's Date: 06/30/2021 OT Individual Time: 1500-1534 OT Individual Time Calculation (min): 34 min   Skilled Therapeutic Interventions/Progress Updates:    Pt greeted sitting in her chair, stated she already completed ADLs today. Pt completed stand pivot<w/c with supervision and was then escorted to the hospital atrium. Pt was a little down so thought the trip would positively enhance psychosocial wellness. Pt ambulated with RW in the community at supervision level. She transferred to a couch with supervision. Pt ambulated back to her w/c and OT escorted her back up to the room. Supervision for stand pivot<chair. Pt remained sitting up at close of session, all needs within reach. Tx focus placed on activity tolerance and dynamic balance.   Therapy Documentation Precautions:  Precautions Precautions: Fall Precaution Comments: hx of back and R knee arthrtis, per pt can not lift more than 10 lbs Restrictions Weight Bearing Restrictions: No Other Position/Activity Restrictions: No lifting more than 10 lbs secondary to CSF leak in the past Vital Signs: Therapy Vitals Temp: 100.1 F (37.8 C) Temp Source: Oral Pulse Rate: 90 Resp: 20 BP: 112/65 Patient Position (if appropriate): Sitting Oxygen Therapy SpO2: 98 % O2 Device: Room Air ADL: ADL Eating: Set up Grooming: Setup Upper Body Bathing: Setup Lower Body Bathing: Maximal assistance Upper Body Dressing: Setup Lower Body Dressing: Maximal assistance Toileting: Dependent Where Assessed-Toileting: Bedside Commode Toilet Transfer: Maximal assistance, Other (comment) (wit stedy) Toilet Transfer Equipment: Other (comment), Bedside commode (stedy) Therapy/Group: Individual Therapy  Lewie Deman A Janeen Watson 06/30/2021, 3:39 PM

## 2021-06-30 LAB — GLUCOSE, CAPILLARY
Glucose-Capillary: 125 mg/dL — ABNORMAL HIGH (ref 70–99)
Glucose-Capillary: 105 mg/dL — ABNORMAL HIGH (ref 70–99)
Glucose-Capillary: 129 mg/dL — ABNORMAL HIGH (ref 70–99)
Glucose-Capillary: 225 mg/dL — ABNORMAL HIGH (ref 70–99)

## 2021-06-30 NOTE — Progress Notes (Signed)
Physical Therapy Session Note  Patient Details  Name: Lisa [name] MRN: [id number] Date of Birth: July 04, 1960  Today's Date: 06/30/2021 PT Individual Time: 1003-1058 PT Individual Time Calculation (min): 55 min   Short Term Goals: Week 1:  PT Short Term Goal 1 (Week 1): Pt will perform rolling in bed from side to side for pressure relief with overall CGA. PT Short Term Goal 1 - Progress (Week 1): Met PT Short Term Goal 2 (Week 1): Pt will perform supine<>sit with MinA using bed features. PT Short Term Goal 2 - Progress (Week 1): Met PT Short Term Goal 3 (Week 1): Pt will perform sit<>stand to RW with MaxA +1. PT Short Term Goal 3 - Progress (Week 1): Met PT Short Term Goal 4 (Week 1): Pt will initiate gait training. PT Short Term Goal 4 - Progress (Week 1): Met PT Short Term Goal 5 (Week 1): Pt will perform stand pivot transfers with ModA. PT Short Term Goal 5 - Progress (Week 1): Met  Skilled Therapeutic Interventions/Progress Updates:  Pt was bedside in the am sitting up in chair. Pt performed all sit to stand transfers with S and verbal cues with rolling walker. Pt ambulated 150 feet x 2 with rolling walker and S. Pt ambulates with slow cadence and required several standing rest breaks. In gym pt performed LE strengthening, step taps and alternating step taps 3 sets x 5 reps each. Pt returned to room following treatment and left sitting up in bedside chair with all needs within reach.   Therapy Documentation Precautions:  Precautions Precautions: Fall Precaution Comments: hx of back and R knee arthrtis, per pt can not lift more than 10 lbs Restrictions Weight Bearing Restrictions: No Other Position/Activity Restrictions: No lifting more than 10 lbs secondary to CSF leak in the past General:   Pain: Generalized c/o chronic pain R shoulder, knee and back.    Therapy/Group: Individual Therapy  [name] 06/30/2021, 12:15 PM

## 2021-06-30 NOTE — Discharge Summary (Signed)
Physician Discharge Summary  [name] ID: Lisa [name] MRN: [id number] DOB/AGE: [birth year] 61 y.o.  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 EXAM: CHEST  1 VIEW COMPARISON:  None. FINDINGS: Mild cardiomegaly. Left lung is clear. Mild right basilar atelectasis or infiltrate is noted. Lisa visualized skeletal structures are unremarkable. IMPRESSION: Mild right basilar atelectasis or infiltrate is noted. Electronically Signed   By: Lisa [name] M.D.   On: 06/11/2021 14:48   CT Head Wo Contrast  Result Date: 06/11/2021 CLINICAL DATA:  Mental status change. EXAM: CT HEAD WITHOUT CONTRAST TECHNIQUE: Contiguous axial images were obtained from Lisa base of Lisa skull through Lisa vertex without intravenous contrast. COMPARISON:  None. FINDINGS: Brain: No intracranial hemorrhage, mass effect, or midline shift. Normal for age brain volume. Slight periventricular chronic small vessel ischemia. No hydrocephalus. Lisa basilar cisterns are patent. No evidence of territorial infarct or acute ischemia. No extra-axial or intracranial fluid collection. Vascular: No hyperdense vessel or unexpected calcification. Skull: No skull fracture or acute findings. Presumed symmetric prominent arachnoid granulations in Lisa anterior temporal bones, nonaggressive in appearance. Sinuses/Orbits: Complete opacification of both mastoid air cells. Mucosal thickening of both sphenoid sinuses and scattered throughout ethmoid air cells. No sinus fluid levels. Orbits are unremarkable. Other: None. IMPRESSION: 1. No acute intracranial abnormality. 2.  Complete opacification of both mastoid air cells. Mucosal thickening of both sphenoid sinuses and scattered throughout ethmoid air cells. Findings are of unknown acuity, may represent mastoiditis or sinusitis in Lisa appropriate clinical setting. Electronically Signed   By: Lisa [name] M.D.   On: 06/11/2021 19:28   MR BRAIN WO CONTRAST  Result Date: 06/25/2021 CLINICAL DATA:  Vasculitis suspected, CNS; bacterial meningitis follow-up EXAM: MRI HEAD WITHOUT CONTRAST TECHNIQUE: Multiplanar, multiecho pulse sequences of Lisa brain and surrounding structures were obtained without intravenous contrast. COMPARISON:  Recent prior MRIs FINDINGS: Brain: Punctate foci of diffusion hyperintensity along Lisa tentorial incisura (series 2, images 22-24). This is in not seen on Lisa coronal sequence. Possible trace dependent diffusion hyperintensity in Lisa occipital horns and fourth ventricle no longer identified. A punctate focus of diffusion hyperintensity in Lisa right temporal-occipital region appears to be extraventricular (series 2, image 22). There is no evidence of intracranial hemorrhage. There is no intracranial mass, mass effect, or edema. Ventricles and sulci are stable in size and configuration. There is no hydrocephalus or extra-axial fluid collection. Vascular: Major vessel flow voids at Lisa skull base are preserved. Skull and upper cervical spine: Normal marrow signal is preserved. Sinuses/Orbits: Minor mucosal thickening.  Orbits are unremarkable. Other: Sella is unremarkable.  Persistent mastoid effusions. IMPRESSION: Punctate foci of diffusion hyperintensity along Lisa tentorial incisura are probably artifactual. Less likely could reflect trace subdural blood products. Suspected trace abnormal signal within Lisa ventricles on Lisa prior study is no longer identified. A punctate focus of diffusion hyperintensity in Lisa right temporal-occipital region appears to be extra-articular and could reflect a tiny subacute  infarct. Persistent nonspecific mastoid effusions. Electronically Signed   By: Lisa [name] M.D.   On: 06/25/2021 13:55   MR BRAIN WO CONTRAST  Result Date: 06/14/2021 CLINICAL DATA:  Neuro deficit, acute, stroke suspected; new onset RUE and RLE weakness in [name] with  bacterial meningitis EXAM: MRI HEAD WITHOUT CONTRAST TECHNIQUE: Multiplanar, multiecho pulse sequences of Lisa brain and surrounding structures were obtained without intravenous contrast. COMPARISON:  06/13/2021 FINDINGS: Only axial and coronal DWI sequences were obtained. [name] could not continue with remainder of study. Suspect trace abnormal diffusion signal in Lisa dependent occipital horns and dependent fourth ventricle on Lisa left. No acute infarction. Ventricles are stable in size without hydrocephalus. No parenchymal edema. IMPRESSION: Single sequence study.  [name] could not continue with remainder. Suspect minimal layering debris in Lisa ventricles suggesting ventriculitis. No hydrocephalus. No parenchymal lesion or edema. Electronically Signed   By: Lisa [name] M.D.   On: 06/14/2021 16:03   MR BRAIN W WO CONTRAST  Result Date: 06/13/2021 CLINICAL DATA:  Initial evaluation for CNS infection, meningitis. EXAM: MRI HEAD WITHOUT AND WITH CONTRAST TECHNIQUE: Multiplanar, multiecho pulse sequences of Lisa brain and surrounding structures were obtained without and with intravenous contrast. CONTRAST:  67mL GADAVIST GADOBUTROL 1 MMOL/ML IV SOLN COMPARISON:  Head CT from 06/11/2021 FINDINGS: Brain: Cerebral volume within normal limits for age. No significant cerebral white matter disease. No abnormal foci of restricted diffusion to suggest acute or subacute ischemia or changes related to seizure. No encephalomalacia to suggest chronic cortical infarction. No foci of susceptibility artifact to suggest acute or chronic intracranial hemorrhage. Probable 2.3 cm benign arachnoid cyst at Lisa left middle cranial fossa. No other mass lesion,  midline shift or mass effect. No hydrocephalus or extra-axial fluid collection. Pituitary gland suprasellar region within normal limits. Midline structures intact. Subtle diffuse left a meningeal enhancement seen throughout both cerebral hemispheres, best appreciated on coronal post gad sequences (series 19, image 12). Finding highly suggestive of acute meningitis. No visible ependymal enhancement or intraventricular debris to suggest associated ventriculitis. Again, no parenchymal signal changes to suggest associated encephalitis/cerebritis. No concerning extra-axial collections. Vascular: Major intracranial vascular flow voids are maintained. Major dural sinuses appear grossly patent following contrast administration. Skull and upper cervical spine: Craniocervical junction within normal limits. Bone marrow signal intensity within normal limits with no focal marrow replacing lesion. No scalp soft tissue abnormality. Sinuses/Orbits: Globes and orbital soft tissues demonstrate no acute finding. Extensive mucosal thickening present throughout Lisa paranasal sinuses. Large bilateral mastoid effusions present as well. [name] appears to be intubated. Other: None. IMPRESSION: 1. Diffuse leptomeningeal enhancement involving both cerebral hemispheres, highly suggestive of acute meningitis. No complicating features. 2. Extensive paranasal sinus disease with large bilateral mastoid effusions. While these findings are of unknown acuity, possible acute sinusitis and/or mastoiditis could be present, and could potentially serve as a source for seeding of CNS infection. Electronically Signed   By: Jeannine Boga M.D.   On: 06/13/2021 05:35   MR Shoulder Right W Wo Contrast  Result Date: 06/18/2021 CLINICAL DATA:  Septic arthritis suspected, shoulder, xray done EXAM: MRI OF Lisa RIGHT SHOULDER WITHOUT AND WITH CONTRAST TECHNIQUE: Multiplanar, multisequence MR imaging of Lisa shoulder was performed before and after Lisa  administration of intravenous contrast. CONTRAST:  23mL GADAVIST GADOBUTROL 1 MMOL/ML IV SOLN COMPARISON:  None. FINDINGS: Rotator cuff: There is mild distal supraspinatus infraspinatus tendinosis. There is focal high grade, partial width tearing of Lisa far anterior fibers of Lisa supraspinatus tendon at Lisa footprint (coronal T2 image 12. Teres minor tendon is intact. Subscapularis tendon is intact. Muscles: No significant muscle atrophy. Streaky muscle edema in Lisa anterior and mid fibers of Lisa deltoid. Biceps Long Head: Intraarticular and extraarticular portions of Lisa biceps tendon are intact. Acromioclavicular Joint: Moderate arthropathy of  Lisa acromioclavicular joint. Small amount of subacromial/subdeltoid bursal fluid. Glenohumeral Joint: No significant joint effusion.  Mild chondrosis. Labrum: Grossly intact, but evaluation is limited by lack of intraarticular fluid/contrast. Bones: No fracture or dislocation. No aggressive osseous lesion. Mild generalized low T1 marrow signal, as can be seen in hematologic derangement. Other: No fluid collection or hematoma. IMPRESSION: Mild distal supraspinatus and infraspinatus tendinosis. Focal high-grade, partial width tearing of Lisa far anterior fibers of Lisa supraspinatus tendon at Lisa footprint. Mild subacromial-subdeltoid bursitis. No convincing findings to suggest septic arthritis. No significant joint effusion or evidence of osteomyelitis. 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 Mild generalized low T1 marrow signal which, can be seen in Lisa setting of hematologic derangement including anemia, correlate with CBC. Electronically Signed   By: Maurine Simmering M.D.   On: 06/18/2021 15:58   IR Fluoro Guide Ndl Plmt / BX  Result Date: 06/12/2021 CLINICAL DATA:  [name] admitted with altered mental status, fevers, history of CSF leak. Interventional radiology asked to perform a diagnostic lumbar puncture. EXAM: DIAGNOSTIC LUMBAR PUNCTURE  UNDER FLUOROSCOPIC GUIDANCE COMPARISON:  None FLUOROSCOPY TIME:  Fluoroscopy Time:  18 seconds Radiation Exposure Index (if provided by Lisa fluoroscopic device): 16.1 mGy Number of Acquired Spot Images: 0 PROCEDURE: Informed consent was obtained from Lisa [name]'s husband prior to Lisa procedure, including potential complications of headache, allergy, and pain. With Lisa [name] prone, Lisa lower back was prepped with Betadine. 1% Lidocaine was used for local anesthesia. Lumbar puncture was performed at Lisa L3-L4 level using a 6 inch 20 gauge gauge needle with return of cloudy CSF with an opening pressure of 45.25 cm water. 12 ml of CSF were obtained for laboratory studies. Lisa [name] tolerated Lisa procedure well and there were no apparent complications. IMPRESSION: Technically successful L3-L4 lumbar puncture yielding 12 mL cloudy CSF for laboratory studies. Read by: Soyla Dryer, NP Electronically Signed   By: Corrie Mckusick D.O.   On: 06/12/2021 16:38   Portable Chest x-ray  Result Date: 06/12/2021 CLINICAL DATA:  Endotracheal tube present Z97.8 (ICD-10-CM) EXAM: PORTABLE CHEST 1 VIEW COMPARISON:  06/11/2021 FINDINGS: Endotracheal tube tip at Lisa level of Lisa clavicular heads. Increased left basilar and similar right midlung opacities. No visible pneumothorax. Left pleural effusion is not excluded. Cardiomediastinal silhouette is mildly enlarged and likely accentuated by AP technique. IMPRESSION: 1. Endotracheal tube tip at Lisa level of Lisa clavicular heads. 2. Increased left basilar and similar right midlung opacities, which could represent atelectasis or pneumonia. Electronically Signed   By: Margaretha Sheffield M.D.   On: 06/12/2021 17:18   DG Abd Portable 1V  Result Date: 06/12/2021 CLINICAL DATA:  Enteric tube placement. EXAM: PORTABLE ABDOMEN - 1 VIEW COMPARISON:  Chest x-ray from same day. FINDINGS: Enteric tube in Lisa stomach. Paucity of bowel gas. No radio-opaque calculi or other significant  radiographic abnormality are seen. Unchanged left basilar airspace disease versus atelectasis. IMPRESSION: 1. Enteric tube in Lisa stomach. Electronically Signed   By: Titus Dubin M.D.   On: 06/12/2021 18:49   EEG adult  Result Date: 06/13/2021 Lora Havens, MD     06/13/2021 11:49 AM [name] Name: Lisa [name] MRN: [id number] Epilepsy Attending: Lora Havens Referring Physician/Provider: Dr Donnetta Simpers Date: 06/13/2021 Duration: 23.43 mins [name] history: 61 y.o. female with PMH significant for DM2, HTN, HLD, insomnia, PCOS, BL temporal bone defects with intermittent CSF leak who presented to Lisa ED with encephalopathy, fever. EEG to evaluate for seizure Level of alertness:  lethargic  AEDs during EEG study: Propofol Technical aspects: This EEG study was done with scalp electrodes positioned according to Lisa 10-20 International system of electrode placement. Electrical activity was acquired at a sampling rate of 500Hz  and reviewed with a high frequency filter of 70Hz  and a low frequency filter of 1Hz . EEG data were recorded continuously and digitally stored. Description: EEG showed continuous generalized predominantly 5 to 6 Hz theta slowing admixed with intermittent generalized 2 to 3 Hz delta slowing. Hyperventilation and photic stimulation were not performed.   ABNORMALITY - Continuous slow, generalized IMPRESSION: This study is suggestive of moderate diffuse encephalopathy, nonspecific etiology. No seizures or epileptiform discharges were seen throughout Lisa recording. Priyanka Barbra Sarks   Overnight EEG with video  Result Date: 06/16/2021 Lora Havens, MD     06/17/2021 10:32 AM [name] Name: Lisa [name] MRN: [id number] Epilepsy Attending: Lora Havens Referring Physician/Provider: Dr Su Monks Duration: 06/15/2021 1833 to 06/16/2021 1450 [name] history:  61 y.o. 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 EEG to evaluate for seizure  Level of alertness:  lethargic AEDs during EEG study: None Technical aspects: This EEG study was done with scalp electrodes positioned according to Lisa 10-20 International system of electrode placement. Electrical activity was acquired at a sampling rate of 500Hz  and reviewed with a high frequency filter of 70Hz  and a low frequency filter of 1Hz . EEG data were recorded continuously and digitally stored. Description: EEG showed continuous generalized predominantly 5 to 6 Hz theta slowing admixed with intermittent generalized 2 to 3 Hz delta slowing. Hyperventilation and photic stimulation were not performed.    ABNORMALITY - Continuous slow, generalized  IMPRESSION: This study is suggestive of moderate diffuse encephalopathy, nonspecific etiology. No seizures or epileptiform discharges were seen throughout Lisa recording.  Priyanka O Yadav    VAS [place] ABI WITH/WO TBI  Result Date: 06/12/2021  LOWER EXTREMITY DOPPLER STUDY [name] Name:  ZYKERRIA TANTON  Date of Exam:   06/12/2021 Medical Rec #: [id number]         Accession #:    [id number] Date of Birth: 1960/10/13         [name] Gender: F [name] Age:   45 years Exam Location:  [facility] Procedure:      VAS [place] ABI WITH/WO TBI Referring Phys: CHI ELLISON --------------------------------------------------------------------------------  Indications: Cool feet. High Risk Factors: Hypertension, hyperlipidemia, Diabetes.  Comparison Study: No prior studies. Performing Technologist: Carlos Levering RVT  Examination Guidelines: A complete evaluation includes at minimum, Doppler waveform signals and systolic blood pressure reading at Lisa level of bilateral brachial, anterior tibial, and posterior tibial arteries, when vessel segments are accessible. Bilateral testing is considered an integral part of a complete examination. Photoelectric Plethysmograph (PPG) waveforms and toe systolic  pressure readings are included as required and additional duplex testing as needed. Limited examinations for reoccurring indications may be performed as noted.  ABI Findings: +---------+------------------+-----+---------+--------+  Right     Rt Pressure (mmHg) Index Waveform  Comment   +---------+------------------+-----+---------+--------+  Brachial  127                      triphasic           +---------+------------------+-----+---------+--------+  PTA       118                0.93  triphasic           +---------+------------------+-----+---------+--------+  DP        98                 0.77  triphasic           +---------+------------------+-----+---------+--------+  Great Toe 54                 0.43                      +---------+------------------+-----+---------+--------+ +--------+------------------+-----+---------+-------+  Left     Lt Pressure (mmHg) Index Waveform  Comment  +--------+------------------+-----+---------+-------+  Brachial 125                      triphasic          +--------+------------------+-----+---------+-------+  PTA      121                0.95  triphasic          +--------+------------------+-----+---------+-------+  DP       102                0.80  triphasic          +--------+------------------+-----+---------+-------+ +-------+-----------+-----------+------------+------------+  ABI/TBI Today's ABI Today's TBI Previous ABI Previous TBI  +-------+-----------+-----------+------------+------------+  Right   0.93        0.43                                   +-------+-----------+-----------+------------+------------+  Left    0.95                                               +-------+-----------+-----------+------------+------------+  Summary: Right: Resting right ankle-brachial index indicates mild right lower extremity arterial disease. Lisa right toe-brachial index is abnormal. Left: Resting left ankle-brachial index is within normal range. No evidence of significant left lower  extremity arterial disease. Unable to obtain TBI due to low amplitude waveforms.  *See table(s) above for measurements and observations.  Electronically signed by Harold Barban MD on 06/12/2021 at 11:31:26 PM.    Final    [place] EKG SITE RITE  Result Date: 06/20/2021 If Site Rite image not attached, placement could not be confirmed due to current cardiac rhythm.   Labs:  Basic Metabolic Panel: Recent Labs  Lab 06/27/21 0531  NA 135  K 4.2  CL 100  CO2 26  GLUCOSE 138*  BUN 14  CREATININE 0.62  CALCIUM 9.1    CBC: Recent Labs  Lab 06/27/21 0531 07/01/21 0926  WBC 6.5 7.0  HGB 13.3 13.3  HCT 38.8 40.2  MCV 91.7 93.1  PLT 422* 394    CBG: Recent Labs  Lab 06/30/21 2055 07/01/21 0554 07/01/21 1137 07/01/21 1714 07/01/21 2141  GLUCAP 225* 108* 102* 111* 116*   Family history.  Mother with hypertension.  Father with prostate cancer and CAD as well as Marfan syndrome.  Sister with hyperlipidemia and diabetes mellitus.  Denies any colon cancer esophageal cancer or rectal cancer  Brief HPI:   Lisa [name] is a 61 y.o. right-handed female with history of diabetes mellitus hypertension hyperlipidemia as well as tobacco use and temporal bone abnormality leading to intermittent CSF leakage with failed attempt to close CSF leak in 2005 and [name] has been followed at  [facility] as well as [facility].  Per chart review lives with spouse.  Independent prior to admission with occasional cane.  Presented 06/11/2021 with altered mental status and fever.  She was placed on broad-spectrum antibiotics.  Cranial CT scan showed no acute intracranial abnormality.  Complete opacification of both mastoid air cells.  MRI of Lisa brain diffuse leptomeningeal enhancement involving both cerebral hemispheres, highly suggestive of acute meningitis.  [name] did require intubation for airway protection extubated 06/14/2021.  Lumbar puncture with 6004-25 white blood cells 84 neutrophils and abundant white blood  cells but no organisms initially seen, protein 283 glucose 32.  Infectious disease consulted as well as neurology maintained on 14-day course of IV ceftriaxone 2 g every 12 hours as well as IV/p.o. Flagyl ending 06/25/2021.  Hospital course did initially require Precedex for agitation.  Recommendations were to follow-up MRI prior to completion of antibiotics.  Lovenox for DVT prophylaxis.  Therapy evaluations completed due to [name] decreased functional mobility altered mental status was admitted for a comprehensive rehab program.   Hospital Course: Lisa [name] was admitted to rehab 06/20/2021 for inpatient therapies to consist of PT, ST and OT at least three hours five days a week. Past admission physiatrist, therapy team and rehab RN have worked together to provide customized collaborative inpatient rehab.  Pertain to [name]'s bacterial meningitis in Lisa setting of CSF leak bilateral temporal bone abnormality remained stable she completed course of antibiotic therapy per infectious disease.  Subcutaneous Lovenox for DVT prophylaxis no bleeding episodes she is also maintained on low-dose aspirin.  She did have bilateral knee pain right greater than left severe tricompartmental osteoarthritis oxycodone as needed.  Mood stabilization with Wellbutrin as well as Zoloft Xanax as needed emotional support provided.  Infectious disease did follow-up in regards to [name]'s meningitis completing course of antibiotic therapy follow-up MRI demonstrated no abscess.  Blood pressure controlled on lisinopril follow-up outpatient.  Blood sugars monitored hemoglobin A1c 6.6 maintained on Semglee [name] also been on Victoza prior to admission.  Obesity BMI 43.95 dietary follow-up.  Noted breast nodule contact made to [facility] breast center for mammogram of which [name] family would need to call to follow-up on scheduling of appointment.  [name] with history of tobacco abuse receive counts regards to cessation of nicotine  products.   Blood pressures were monitored on TID basis and controlled  Diabetes has been monitored with ac/hs CBG checks and SSI was use prn for tighter BS control.    Rehab course: During [name]'s stay in rehab weekly team conferences were held to monitor [name]'s progress, set goals and discuss barriers to discharge. At admission, [name] required moderate assist supine to sit moderate assist sit to supine total assist upper lower body bathing  Physical exam.  Blood pressure 134/81 pulse 78 temperature 98.3 respirations 18 oxygen saturation 92% room air Constitutional.  No acute distress HEENT Head.  Normocephalic and atraumatic Eyes.  Pupils round and reactive to light no discharge.nystagmus Neck.  Supple nontender no JVD without thyromegaly Cardiac regular rate rhythm any extra sounds or murmur heard Abdomen.  Soft nontender positive bowel sounds without rebound Respiratory effort normal no respiratory distress without wheeze Skin.  Intact Neurologic.  Alert oriented makes eye contact with examiner.  Follows simple commands.  Avinger.  Was unable to move right lower extremity off bed currently seems to be limited by right knee pain, 5/5 strength in other extremities.  Right greater than left knee tenderness  He/She  has had improvement  in activity tolerance, balance, postural control as well as ability to compensate for deficits. He/She has had improvement in functional use RUE/LUE  and RLE/LLE as well as improvement in awareness.  [name] with excellent overall progress towards mobility goals ambulating 100 feet rolling walker contact-guard.  Stand step transfers contact-guard assist.  [name] perform standing alternating toe tapping on a 6 inch step with bilateral upper extremity support for balance training strengthening contact-guard.  [name] is at a contact-guard min assist level for all functional basic ADLs.  Full family teaching completed plan discharged to  home       Disposition: Discharged home    Diet: Diabetic diet  Special Instructions: No driving smoking or alcohol  Follow-up with PCP Dr. Delfino Lovett [phone] [place] to Schedule for Mammogram for Follow-Up of Breast Cancer Nodule Needing Referral Prior to Scheduling Appointment  Medications at discharge 1.  Tylenol as needed 2.  Acidophilus 1 capsule daily 3.  Xanax 0.25 mg twice daily as needed anxiety 4.  Aspirin 81 mg p.o. daily 5.  Wellbutrin XL 300 mg p.o. daily 6.  Voltaren gel 4 g every 6 hours to affected area 7.  Semglee 5 units nightly 8.  Lisinopril 5 mg daily 9.  NicoDerm patch taper as directed 10.  Oxycodone 5 mg every 6 hours as needed severe pain 11.  Protonix 40 mg nightly 12.  Zoloft 100 mg p.o. daily 13.  Albuterol inhaler 2 puffs every 6 hours as needed 14.Eszopiclone 3 mg nightly 15.  Lisinopril 5 mg daily 16.  Aldactone 25 mg daily  30-35 minutes were spent completing discharge summary and discharge planning    Follow-up Information     Meredith Staggers, MD Follow up.   Specialty: Physical Medicine and Rehabilitation Why: Office to call for appointment Contact information: [address] [place] [address] [phone]         Tommy Medal, Lavell Islam, MD Follow up.   Specialty: Infectious Diseases Why: Call for appointment Contact information: [address] [address] [phone]         Linward Headland, MD .   Specialty: Family Medicine Contact information: [address] [place] [address] [phone]                 Signed: Cathlyn Parsons 07/02/2021, 5:42 AM

## 2021-07-01 ENCOUNTER — Other Ambulatory Visit (HOSPITAL_COMMUNITY): Payer: Self-pay

## 2021-07-01 LAB — CBC
HCT: 40.2 % (ref 36.0–46.0)
Hemoglobin: 13.3 g/dL (ref 12.0–15.0)
MCH: 30.8 pg (ref 26.0–34.0)
MCHC: 33.1 g/dL (ref 30.0–36.0)
MCV: 93.1 fL (ref 80.0–100.0)
Platelets: 394 10*3/uL (ref 150–400)
RBC: 4.32 MIL/uL (ref 3.87–5.11)
RDW: 14.1 % (ref 11.5–15.5)
WBC: 7 10*3/uL (ref 4.0–10.5)
nRBC: 0 % (ref 0.0–0.2)

## 2021-07-01 LAB — GLUCOSE, CAPILLARY
Glucose-Capillary: 118 mg/dL — ABNORMAL HIGH (ref 70–99)
Glucose-Capillary: 177 mg/dL — ABNORMAL HIGH (ref 70–99)
Glucose-Capillary: 177 mg/dL — ABNORMAL HIGH (ref 70–99)
Glucose-Capillary: 113 mg/dL — ABNORMAL HIGH (ref 70–99)
Glucose-Capillary: 120 mg/dL — ABNORMAL HIGH (ref 70–99)
Glucose-Capillary: 157 mg/dL — ABNORMAL HIGH (ref 70–99)
Glucose-Capillary: 108 mg/dL — ABNORMAL HIGH (ref 70–99)
Glucose-Capillary: 102 mg/dL — ABNORMAL HIGH (ref 70–99)
Glucose-Capillary: 111 mg/dL — ABNORMAL HIGH (ref 70–99)
Glucose-Capillary: 116 mg/dL — ABNORMAL HIGH (ref 70–99)

## 2021-07-01 MED ORDER — BUPROPION HCL ER (XL) 300 MG PO TB24
300.0000 mg | ORAL_TABLET | Freq: Every day | ORAL | 0 refills | Status: DC
Start: 2021-07-01 — End: 2021-08-16
  Filled 2021-07-01 – 2021-07-24 (×2): qty 30, 30d supply, fill #0

## 2021-07-01 MED ORDER — DICLOFENAC SODIUM 1 % EX GEL
4.0000 g | Freq: Four times a day (QID) | CUTANEOUS | 0 refills | Status: DC | PRN
  Filled 2021-07-01: qty 100, 7d supply, fill #0

## 2021-07-01 MED ORDER — SERTRALINE HCL 100 MG PO TABS
100.0000 mg | ORAL_TABLET | Freq: Every day | ORAL | 0 refills | Status: DC
  Filled 2021-07-01: qty 30, 30d supply, fill #0

## 2021-07-01 MED ORDER — LISINOPRIL 5 MG PO TABS
5.0000 mg | ORAL_TABLET | Freq: Every day | ORAL | 0 refills | Status: DC
  Filled 2021-07-01: qty 30, 30d supply, fill #0

## 2021-07-01 MED ORDER — OXYCODONE HCL 5 MG PO TABS
5.0000 mg | ORAL_TABLET | Freq: Four times a day (QID) | ORAL | 0 refills | Status: DC | PRN
  Filled 2021-07-01: qty 30, 7d supply, fill #0

## 2021-07-01 MED ORDER — PANTOPRAZOLE SODIUM 40 MG PO TBEC
40.0000 mg | DELAYED_RELEASE_TABLET | Freq: Every day | ORAL | 0 refills | Status: DC
  Filled 2021-07-01: qty 30, 30d supply, fill #0

## 2021-07-01 MED ORDER — RISAQUAD PO CAPS
1.0000 | ORAL_CAPSULE | Freq: Every day | ORAL | 0 refills | Status: DC
  Filled 2021-07-01: qty 30, 30d supply, fill #0

## 2021-07-01 MED ORDER — ALPRAZOLAM 0.25 MG PO TABS
0.2500 mg | ORAL_TABLET | Freq: Two times a day (BID) | ORAL | 0 refills | Status: DC | PRN
  Filled 2021-07-01: qty 20, 10d supply, fill #0

## 2021-07-01 MED ORDER — ALBUTEROL SULFATE HFA 108 (90 BASE) MCG/ACT IN AERS
2.0000 | INHALATION_SPRAY | Freq: Four times a day (QID) | RESPIRATORY_TRACT | 0 refills | Status: AC | PRN
  Filled 2021-07-01: qty 8.5, 25d supply, fill #0

## 2021-07-01 MED ORDER — NICOTINE 14 MG/24HR TD PT24
MEDICATED_PATCH | TRANSDERMAL | 0 refills | Status: DC
  Filled 2021-07-01: qty 28, 30d supply, fill #0

## 2021-07-01 NOTE — Plan of Care (Signed)
Problem: RH Balance Goal: LTG: Patient will maintain dynamic sitting balance (OT) Description: LTG:  Patient will maintain dynamic sitting balance with assistance during activities of daily living (OT) Outcome: Completed/Met Goal: LTG Patient will maintain dynamic standing with ADLs (OT) Description: LTG:  Patient will maintain dynamic standing balance with assist during activities of daily living (OT)  Outcome: Completed/Met   Problem: Sit to Stand Goal: LTG:  Patient will perform sit to stand in prep for activites of daily living with assistance level (OT) Description: LTG:  Patient will perform sit to stand in prep for activites of daily living with assistance level (OT) Outcome: Completed/Met   Problem: RH Bathing Goal: LTG Patient will bathe all body parts with assist levels (OT) Description: LTG: Patient will bathe all body parts with assist levels (OT) Outcome: Completed/Met   Problem: RH Dressing Goal: LTG Patient will perform lower body dressing w/assist (OT) Description: LTG: Patient will perform lower body dressing with assist, with/without cues in positioning using equipment (OT) Outcome: Completed/Met   Problem: RH Toileting Goal: LTG Patient will perform toileting task (3/3 steps) with assistance level (OT) Description: LTG: Patient will perform toileting task (3/3 steps) with assistance level (OT)  Outcome: Completed/Met   Problem: RH Toilet Transfers Goal: LTG Patient will perform toilet transfers w/assist (OT) Description: LTG: Patient will perform toilet transfers with assist, with/without cues using equipment (OT) Outcome: Completed/Met   Problem: RH Tub/Shower Transfers Goal: LTG Patient will perform tub/shower transfers w/assist (OT) Description: LTG: Patient will perform tub/shower transfers with assist, with/without cues using equipment (OT) Outcome: Completed/Met   Problem: RH Dressing Goal: LTG Patient will perform lower body dressing w/assist  (OT) Description: LTG: Patient will perform lower body dressing with assist, with/without cues in positioning using equipment (OT) Outcome: Completed/Met   Problem: RH Furniture Transfers Goal: LTG Patient will perform furniture transfers w/assist (OT/PT) Description: LTG: Patient will perform furniture transfers  with assistance (OT/PT). Outcome: Completed/Met

## 2021-07-01 NOTE — Progress Notes (Signed)
Occupational Therapy Session Note  Patient Details  Name: Lisa [name] MRN: [id number] Date of Birth: May 04, 1961  Today's Date: 07/01/2021 OT Individual Time: 4591-3685 OT Individual Time Calculation (min): 56 min   Short Term Goals: Week 2:  OT Short Term Goal 1 (Week 2): LTG=STG 2/2 ELOS  Skilled Therapeutic Interventions/Progress Updates:    Pt greeted seated in chair and agreeable to OT treatment session. Pt requesting to shower today. Pt able to collect clothing using RW with min cues for safety. Functional ambulation to bathroom mod I. Bathing completed with supervision from tub bench and HH shower hose. Dressing tasks also completed sit<>stand from EOB with increased time, but overall mod I. Pt ambulated to therapy gym and back with RW and no LOB or knee buckling. Pt left seated in chair with call bell in reach and needs met.   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

## 2021-07-01 NOTE — Progress Notes (Signed)
Patient ID: Lisa [name], female   DOB: 09/24/1960, 61 y.o.   MRN: [id number]  SW ordered 3in1 BSC with [employer] via parachute.   SW informed pt on item ordered. Pt requested due to urgency in which she knows she will need in her room since she can not walk down to the hall as quickly as she would like too.  [name], MSW, [place] Office: [phone] Cell: [phone] Fax: [phone]

## 2021-07-01 NOTE — Progress Notes (Signed)
Physical Therapy Discharge Summary  Patient Details  Name: Lisa [name] MRN: [id number] Date of Birth: March 10, 1961  Today's Date: 07/01/2021 PT Individual Time: [id number] and 6606-3016 PT Individual Time Calculation (min): 56 min and 25 min   Patient has met 9 of 9 long term goals due to improved activity tolerance, improved balance, improved postural control, and increased strength.  Patient to discharge at an ambulatory level Supervision.   Patient's husband attended family education and is independent to provide the necessary physical assistance at discharge.  Reasons goals not met: NAS  Recommendation: Patient will benefit from ongoing skilled PT services in outpatient setting to continue to advance safe functional mobility, address ongoing impairments in strength, balance, ambulation, and minimize fall risk.  Equipment: No equipment provided  Reasons for discharge: treatment goals met and discharge from hospital  Patient/family agrees with progress made and goals achieved: Yes  Skilled therapeutic Interventions: Pt received seated in [facility] and agrees to therapy. Reports some pain in R shoulder from "over working". Number not provided. PT provides rest breaks as needed and education to manage pain. WC transport to gym for time management. Pt performs car transfer with RW and cues for sequencing, then ramp navigation with cues to increase step height for safety. Following seated rest break, pt completes x12 6" steps with bilateral handrails and cues for step sequencing. Seated rest break. Pt performs sit to stand with cues for hand placement and body mechanics. Pt ambulates x175' with RW and cues to decrease WB through RW for energy conservation. Pt completes 5x Sit to Stand in 27.0 seconds. Pt ambulates to mat table with RW. Mat raised up to simulate high bed height at home. Pt cued on stepping up backward on stool to sit in bed. Pt completes with cues. Sit <> supine independent. WC  transport back to room. Stand step transfer to hard backed chair with RW. Left seated with all needs within reach.   2nd Session: Pt received seated in hard backed chair and agrees to therapy. No complaint of pain. Stand step transfer to [facility] mod(I) with RW. WC transport to gym for time management. Pt transfers to Nustep with RW. Pt completes x10:00 at workload of 5 with average steps per minute ~75. Performed for strength and endurance training. Stand step transfer back to WC>hard backed chair mod(I) with RW. Pt made mod(I) in room.    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 Rarely or not at all Pain Interference with Therapy Activities: 1. Rarely or not at all Pain Interference with Day-to-Day Activities: 1. Rarely or not at all Vision/Perception  Perception Perception: Within Functional Limits Praxis Praxis: Intact  Cognition Overall Cognitive Status: Within Functional Limits for tasks assessed Arousal/Alertness: Awake/alert Orientation Level: Oriented X4 Memory: Appears intact Safety/Judgment: Appears intact Sensation Sensation Light Touch: Appears Intact Coordination Gross Motor Movements are Fluid and Coordinated: Yes Fine Motor Movements are Fluid and Coordinated: Yes Heel Shin Test: able but limited initially by body habitus  Motor  Motor Motor: Within Functional Limits  Mobility Bed  Mobility Bed Mobility: Sit to Supine;Supine to Sit Supine to Sit: Independent with assistive device Sit to Supine: Independent Sit to Sidelying Right: Independent Transfers Transfers: Sit to Stand;Stand to Sit Sit to Stand: Independent with assistive device Stand to Sit: Independent with assistive device Transfer (Assistive device): Rolling walker Locomotion  Gait Ambulation: Yes Gait Assistance: Supervision/Verbal cueing Gait Distance (Feet): 175 Feet Assistive device: Rolling walker Gait Assistance Details: Verbal cues for sequencing;Verbal cues for gait pattern;Verbal cues for safe use of DME/AE Gait Gait: Yes Gait Pattern: Impaired Gait Pattern: Decreased stride length (Increased WB through RW) Stairs / Additional Locomotion Stairs: Yes Stairs Assistance: Supervision/Verbal cueing Stair Management Technique: Two rails Number of Stairs: 12 Height of Stairs: 6 Ramp: Supervision/Verbal cueing Curb: Supervision/Verbal cueing Wheelchair Mobility Wheelchair Mobility: No  Trunk/Postural Assessment  Cervical Assessment Cervical Assessment: Exceptions to [facility] (forward head) Thoracic Assessment Thoracic Assessment: Exceptions to [facility] (rounded shoulders) Lumbar Assessment Lumbar Assessment: Exceptions to [facility] (posterior pelvic tilt) Postural Control Postural Control: Deficits on evaluation Trunk Control: has difficulty with full trunk extension d/t chronic back pain 2/2 bulging discs  Balance Balance Balance Assessed: Yes Static Sitting Balance Static Sitting - Level of Assistance: 7: Independent;6: Modified independent (Device/Increase time) Dynamic Sitting Balance Dynamic Sitting - Level of Assistance: 7: Independent Static Standing Balance Static Standing - Level of Assistance: 6: Modified independent (Device/Increase time) Dynamic Standing Balance Dynamic Standing - Level of Assistance: 6: Modified independent (Device/Increase time) Extremity Assessment  RLE  Assessment RLE Assessment: Exceptions to [name] RLE Strength Right Hip Flexion: 3/5 Right Hip Extension: 3+/5 Right Hip ABduction: 4/5 Right Hip ADduction: 4/5 Right Knee Flexion: 4/5 Right Knee Extension: 4+/5 Right Ankle Dorsiflexion: 4/5 Right Ankle Plantar Flexion: 4/5 LLE Assessment LLE Assessment: Exceptions to [facility] LLE Strength Left Hip Flexion: 3/5 Left Hip Extension: 3+/5 Left Hip ABduction: 4/5 Left Hip ADduction: 4/5 Left Knee Flexion: 4/5 Left Knee Extension: 4+/5 Left Ankle Dorsiflexion: 4+/5 Left Ankle Plantar Flexion: 4+/5    Breck Coons, PT, DPT 07/01/2021, 8:43 AM

## 2021-07-01 NOTE — Progress Notes (Signed)
Occupational Therapy Discharge Summary  Patient Details  Name: Lisa [name] MRN: [id number] Date of Birth: [birth year]  Today's Date: 07/01/2021 OT Individual Time: 1403-1500 OT Individual Time Calculation (min): 57 min   Pt greeted seated in wc and agreeable to OT treatment session. Pt ambulated to the bathroom with continent void of bladder mod I. Pt washed hands at the sink mod I, then sat in wc. OT propelled wc to therapy gym for time management. OT issued UB there-ex handout using medium resistance red theraband. Went through home exercise handout including straight arm pulls, straight arm raise, overhead pull down, side arm raise, forearm pull, triceps elbow extension, and bicep curl. Pt then completed 10 minutes on NuStep on level 3 for there-ex. Pt returned to room and pivoted back to straight back chair mod I with RW. Pt left with call bell in reach and needs met.   Patient has met 9 of 9 long term goals due to improved activity tolerance, improved balance, postural control, ability to compensate for deficits, and improved coordination.  Patient to discharge at overall Modified Independent/supervision level.  Patient's care partner is independent to provide the necessary physical assistance at discharge.    Reasons goals not met: n/a  Recommendation:  Patient will benefit from ongoing skilled OT services in outpatient setting to continue to advance functional skills in the area of BADL.  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 Pain  Denies pain ADL ADL Eating: Independent Grooming: Independent Upper Body Bathing: Independent Lower Body Bathing: Supervision/safety Upper  Body Dressing: Independent Lower Body Dressing: Modified independent Toileting: Modified independent Where Assessed-Toileting: Bedside Commode Toilet Transfer: Modified independent Toilet Transfer Equipment: Other (comment), Bedside commode (stedy) Tub/Shower Transfer: Close supervison Perception  Perception: Within Functional Limits Praxis Praxis: Intact Cognition Overall Cognitive Status: Within Functional Limits for tasks assessed Arousal/Alertness: Awake/alert Orientation Level: Oriented X4 Year: 2023 Month: January Day of Week: Correct Attention: Alternating Memory: Appears intact Immediate Memory Recall: Sock;Blue;Bed Memory Recall Sock: Without Cue Memory Recall Blue: Without Cue Memory Recall Bed: Without Cue Awareness: Appears intact Problem Solving: Appears intact Safety/Judgment: Appears intact Sensation Sensation Light Touch: Appears Intact Hot/Cold: Appears Intact Proprioception: Appears Intact Stereognosis: Appears Intact Coordination Gross Motor Movements are Fluid and Coordinated: Yes Fine Motor Movements are Fluid and Coordinated: Yes Coordination and Movement Description: coordination much improved since eval Motor  Motor Motor: Within Functional Limits Mobility  Bed Mobility Supine to Sit: Independent with assistive device Sit to Supine: Independent with assistive device Sit to Sidelying Right: Independent with assistive device Transfers Sit to Stand: Independent with assistive device Stand to Sit: Independent with assistive device  Balance Static Sitting Balance Static Sitting - Level of Assistance: 7: Independent;6: Modified independent (Device/Increase time) Dynamic Sitting Balance Dynamic Sitting - Level of Assistance: 7: Independent Static Standing Balance Static Standing - Level of Assistance: 6: Modified independent (Device/Increase time) (with help from stedy) Dynamic Standing Balance Dynamic Standing - Level of Assistance: 6:  Modified independent (Device/Increase time) (unable to perform) Extremity/Trunk Assessment RUE Assessment General Strength Comments: 4+/5 (has RTC injury but does not severely limit) LUE Assessment General Strength Comments: 5/5   Daneen Schick Liela Rylee 07/01/2021, 3:05 PM

## 2021-07-01 NOTE — Progress Notes (Signed)
PROGRESS NOTE   Subjective/Complaints: Anxious that she had a 100.1 temp over the weekend. F/u temp was 99.7. all others in normal range. Denies any associated sx.  ROS: Patient denies  rash, sore throat, blurred vision, nausea, vomiting, diarrhea, cough, shortness of breath or chest pain, joint or back pain, headache, or mood change. .   Objective:   No results found. No results for input(s): WBC, HGB, HCT, PLT in the last 72 hours.  No results for input(s): NA, K, CL, CO2, GLUCOSE, BUN, CREATININE, CALCIUM in the last 72 hours.   Intake/Output Summary (Last 24 hours) at 07/01/2021 7262 Last data filed at 07/01/2021 0355 Gross per 24 hour  Intake 1200 ml  Output --  Net 1200 ml     Pressure Injury 06/12/21 Sacrum Left;Upper Deep Tissue Pressure Injury - Purple or maroon localized area of discolored intact skin or blood-filled blister due to damage of underlying soft tissue from pressure and/or shear. (Active)  06/12/21 1644  Location: Sacrum  Location Orientation: Left;Upper  Staging: Deep Tissue Pressure Injury - Purple or maroon localized area of discolored intact skin or blood-filled blister due to damage of underlying soft tissue from pressure and/or shear.  Wound Description (Comments):   Present on Admission: No     Pressure Injury 06/12/21 Sacrum Mid Stage 1 -  Intact skin with non-blanchable redness of a localized area usually over a bony prominence. (Active)  06/12/21 2000  Location: Sacrum  Location Orientation: Mid  Staging: Stage 1 -  Intact skin with non-blanchable redness of a localized area usually over a bony prominence.  Wound Description (Comments):   Present on Admission: Yes    Physical Exam: Vital Signs Blood pressure 111/67, pulse 81, temperature 97.8 F (36.6 C), temperature source Oral, resp. rate 18, height 5\' 6"  (1.676 m), weight 119.7 kg, last menstrual period 11/02/2013, SpO2 98  %. Constitutional: No distress . Vital signs reviewed. obese HEENT: NCAT, EOMI, oral membranes moist Neck: supple Cardiovascular: RRR without murmur. No JVD    Respiratory/Chest: CTA Bilaterally without wheezes or rales. Normal effort    GI/Abdomen: BS +, non-tender, non-distended Ext: no clubbing, cyanosis, or edema Psych: pleasant and cooperative  Skin:: sacral wounds dressed Neuro:  Alert and oriented x 3. Fair insight and awareness. Intact Memory. Normal language and speech. Cranial nerve exam unremarkable. UE  strength 5/5. LLE 3-4/5 prox to 4/5 distally. RLE 1-2/5 prox (pain) to 4/5 distally. Subjective decrease in LT over dorsum of toes +/-.no changes  Musculoskeletal: no effusion or erythema in knees. +jt line pain at knees Gyn: continued hard, movable nodule underneath left nipple. At least 1/2" in diameter.   Assessment/Plan: 1. Functional deficits which require 3+ hours per day of interdisciplinary therapy in a comprehensive inpatient rehab setting. Physiatrist is providing close team supervision and 24 hour management of active medical problems listed below. Physiatrist and rehab team continue to assess barriers to discharge/monitor patient progress toward functional and medical goals  Care Tool:  Bathing    Body parts bathed by patient: Right arm, Left arm, Chest, Front perineal area, Abdomen, Face, Left lower leg, Right lower leg, Left upper leg, Right upper leg, Buttocks  Body parts bathed by helper: Front perineal area, Buttocks, Left lower leg, Right lower leg     Bathing assist Assist Level: Contact Guard/Touching assist     Upper Body Dressing/Undressing Upper body dressing   What is the patient wearing?: Pull over shirt    Upper body assist Assist Level: Minimal Assistance - Patient > 75%    Lower Body Dressing/Undressing Lower body dressing    Lower body dressing activity did not occur: Safety/medical concerns What is the patient wearing?:  Underwear/pull up, Pants     Lower body assist Assist for lower body dressing: Contact Guard/Touching assist     Toileting Toileting    Toileting assist Assist for toileting: Contact Guard/Touching assist     Transfers Chair/bed transfer  Transfers assist     Chair/bed transfer assist level: Supervision/Verbal cueing     Locomotion Ambulation   Ambulation assist   Ambulation activity did not occur: Safety/medical concerns  Assist level: Supervision/Verbal cueing Assistive device: Walker-rolling Max distance: 150   Walk 10 feet activity   Assist  Walk 10 feet activity did not occur: Safety/medical concerns  Assist level: Supervision/Verbal cueing Assistive device: Walker-rolling   Walk 50 feet activity   Assist Walk 50 feet with 2 turns activity did not occur: Safety/medical concerns  Assist level: Supervision/Verbal cueing Assistive device: Walker-rolling    Walk 150 feet activity   Assist Walk 150 feet activity did not occur: Safety/medical concerns  Assist level: Supervision/Verbal cueing Assistive device: Walker-rolling    Walk 10 feet on uneven surface  activity   Assist Walk 10 feet on uneven surfaces activity did not occur: Safety/medical concerns         Wheelchair     Assist Is the patient using a wheelchair?: Yes Type of Wheelchair: Manual Wheelchair activity did not occur: Safety/medical concerns         Wheelchair 50 feet with 2 turns activity    Assist    Wheelchair 50 feet with 2 turns activity did not occur: Safety/medical concerns       Wheelchair 150 feet activity     Assist  Wheelchair 150 feet activity did not occur: Safety/medical concerns       Blood pressure 111/67, pulse 81, temperature 97.8 F (36.6 C), temperature source Oral, resp. rate 18, height 5\' 6"  (1.676 m), weight 119.7 kg, last menstrual period 11/02/2013, SpO2 98 %.  Medical Problem List and Plan: 1. 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 I told her we would help with basic medical needs at [facility] until then            -10lb lifting restriction for objects   -finalize dc planning -outpt f/u with [facility] as well as PCP above and ID  2.  Antithrombotics: -DVT/anticoagulation:  Pharmaceutical: Lovenox             -antiplatelet therapy: Aspirin 81 mg daily 3. Bilateral knee pain R>L. 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  -Ice to bilateral knees     4. Depression: continue Wellbutrin 300 mg daily, Zoloft 100 mg daily, Xanax 0.25 mg twice daily as needed  -mood seems appropriate.  Husband is supportive             -antipsychotic agents: N/A 5. Neuropsych: This patient is capable of making decisions on her own behalf. 6. Skin/Wound Care: Routine skin checks 7. Fluids/Electrolytes/Nutrition: encourage appropriate po intake  -protein supp for low albumin 8.  ID.  IV ceftriaxone 2 g every 12 hours as well as Flagyl 500 mg 3 times daily through 06/25/2021. --COMPLETE  -1/17 MRI demonstrated improvement, no abscesses -continue protonix and prn zofran for nausea  -1/23 given low grade temp over weekend, will check cbc today   9.  Hypertension.   continue Lisinopril 5 mg daily.     1/23 well controlled 10.  Diabetes mellitus.  Hemoglobin A1c 6.6.  SSI.   -Patient on Lantus insulin 10 units nightly PRIOR TO ADMISSION as well as Victoza 1.2 mg dose daily.    CBG (last 3)  Recent Labs    06/30/21 1653 06/30/21 2055 07/01/21 0554  GLUCAP 129* 225* 108*  -previously resumed lantus at 5u qhs--will increase to 10u qhs 1/20 1/23 cbg's higher in evening. Might benefit from am dose of insulin  -resume victoza once home 11.  Tobacco abuse.   NicoDerm patch.  Provide counseling 12.  Right shoulder pain.  MRI right shoulder mild distal supraspinatus and infraspinatus tendinosis.  Focal high-grade partial width tearing of the far anterior fibers of the supraspinatus tendon.  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   -pain is improved   -Conservative care, rom, strengthening with therapy - continue Voltaren gel 13.  Obesity.  BMI 43.95.  Dietary follow-up. Caregiver training on ergonomic assistance techniques as she will likely continue to require some assistance upon discharge 14. Mild transaminitis  -LFT's remain slightly elevated but not too different from 1/5  -resolved 15. Breast nodule:  -PA is working on mammogram at breast center.     LOS: 11 days A FACE TO FACE EVALUATION WAS PERFORMED  Meredith Staggers 07/01/2021, 9:09 AM

## 2021-07-02 ENCOUNTER — Other Ambulatory Visit (HOSPITAL_COMMUNITY): Payer: Self-pay

## 2021-07-02 DIAGNOSIS — N63 Unspecified lump in unspecified breast: Secondary | ICD-10-CM

## 2021-07-02 LAB — GLUCOSE, CAPILLARY: Glucose-Capillary: 125 mg/dL — ABNORMAL HIGH (ref 70–99)

## 2021-07-02 MED ORDER — INSULIN GLARGINE 100 UNIT/ML SOLOSTAR PEN
5.0000 [IU] | PEN_INJECTOR | Freq: Every day | SUBCUTANEOUS | 11 refills | Status: DC
  Filled 2021-07-02: qty 3, 60d supply, fill #0
  Filled 2021-09-16: qty 3, 60d supply, fill #1

## 2021-07-02 NOTE — Progress Notes (Signed)
Pt discharged home with husband. Given home meds from [facility]. Pt taken via wheelchair with belongings. No complications noted.   Melene Plan, LPN

## 2021-07-02 NOTE — Progress Notes (Signed)
Inpatient Rehabilitation Discharge Medication Review by a Pharmacist  A complete drug regimen review was completed for this patient to identify any potential clinically significant medication issues.  High Risk Drug Classes Is patient taking? Indication by Medication  Antipsychotic No   Anticoagulant No   Antibiotic No   Opioid Yes PRN oxycodone as needed for severe pain  Antiplatelet No   Hypoglycemics/insulin Yes Semglee insulin for DM  Vasoactive Medication Yes Lisinopril for BP, Aldactone for fluid/BP  Chemotherapy No   Other Yes Xanax as needed for anxiety Wellbutrin XL, Zoloft for mood Protonix for reflux     Type of Medication Issue Identified Description of Issue Recommendation(s)  Drug Interaction(s) (clinically significant)     Duplicate Therapy     Allergy     No Medication Administration End Date     Incorrect Dose     Additional Drug Therapy Needed     Significant med changes from prior encounter (inform family/care partners about these prior to discharge).    Other       Clinically significant medication issues were identified that warrant physician communication and completion of prescribed/recommended actions by midnight of the next day:  No  Pharmacist comments: None  Time spent performing this drug regimen review (minutes):  20 minutes   Tad Moore 07/01/2021 9:01 AM

## 2021-07-02 NOTE — Progress Notes (Signed)
Inpatient Rehabilitation Care Coordinator Discharge Note   Patient Details  Name: Lisa [name] MRN: [id number] Date of Birth: Nov 29, 1960   Discharge location: D/c to home with support from her husband and dtr.  Length of Stay: 11 days  Discharge activity level: Supervision to Mod I  Home/community participation: Limited  Patient response WU:[id number] Literacy - How often do you need to have someone help you when you read instructions, pamphlets, or other written material from your doctor or pharmacy?: Never  Patient response YN:WGNFAO Isolation - How often do you feel lonely or isolated from those around you?: Rarely  Services provided included: MD, RD, PT, OT, RN, CM, TR, Pharmacy, SW, Neuropsych  Financial Services:  Charity fundraiser Utilized: Chartered certified accountant  Choices offered to/list presented to: Yes  Follow-up services arranged:  Outpatient, DME    Outpatient Servicies: [facility] for outpatient PT/OT DME : [place] for [facility]in[facility]  [facility]    Patient response to transportation need: Is the patient able to respond to transportation needs?: Yes In the past 12 months, has lack of transportation kept you from medical appointments or from getting medications?: No In the past 12 months, has lack of transportation kept you from meetings, work, or from getting things needed for daily living?: No  Comments (or additional information):  Patient/Family verbalized understanding of follow-up arrangements:  Yes  Individual responsible for coordination of the follow-up plan: contact pt [phone]  Confirmed correct DME delivered: [name] 07/02/2021    [name]

## 2021-07-02 NOTE — Progress Notes (Signed)
[facility] for home meds.

## 2021-07-02 NOTE — Progress Notes (Signed)
PROGRESS NOTE   Subjective/Complaints: Pt in good spirits. No new issues. No fevers  ROS: Patient denies fever, rash, sore throat, blurred vision, nausea, vomiting, diarrhea, cough, shortness of breath or chest pain, joint or back pain, headache, or mood change.   Objective:   No results found. Recent Labs    07/01/21 0926  WBC 7.0  HGB 13.3  HCT 40.2  PLT 394    No results for input(s): NA, K, CL, CO2, GLUCOSE, BUN, CREATININE, CALCIUM in the last 72 hours.   Intake/Output Summary (Last 24 hours) at 07/02/2021 0855 Last data filed at 07/02/2021 0700 Gross per 24 hour  Intake 760 ml  Output --  Net 760 ml     Pressure Injury 06/12/21 Sacrum Left;Upper Deep Tissue Pressure Injury - Purple or maroon localized area of discolored intact skin or blood-filled blister due to damage of underlying soft tissue from pressure and/or shear. (Active)  06/12/21 1644  Location: Sacrum  Location Orientation: Left;Upper  Staging: Deep Tissue Pressure Injury - Purple or maroon localized area of discolored intact skin or blood-filled blister due to damage of underlying soft tissue from pressure and/or shear.  Wound Description (Comments):   Present on Admission: No     Pressure Injury 06/12/21 Sacrum Mid Stage 1 -  Intact skin with non-blanchable redness of a localized area usually over a bony prominence. (Active)  06/12/21 2000  Location: Sacrum  Location Orientation: Mid  Staging: Stage 1 -  Intact skin with non-blanchable redness of a localized area usually over a bony prominence.  Wound Description (Comments):   Present on Admission: Yes    Physical Exam: Vital Signs Blood pressure 107/60, pulse 84, temperature 97.9 F (36.6 C), resp. rate 18, height 5\' 6"  (1.676 m), weight 119.7 kg, last menstrual period 11/02/2013, SpO2 97 %. Constitutional: No distress . Vital signs reviewed. HEENT: NCAT, EOMI, oral membranes moist Neck:  supple Cardiovascular: RRR without murmur. No JVD    Respiratory/Chest: CTA Bilaterally without wheezes or rales. Normal effort    GI/Abdomen: BS +, non-tender, non-distended Ext: no clubbing, cyanosis, or edema Psych: pleasant and cooperative  Skin: small sacral wounds Neuro:  Alert and oriented x 3. Fair insight and awareness. Intact Memory. Normal language and speech. Cranial nerve exam unremarkable. UE  strength 5/5. LLE 3-4/5 prox to 4/5 distally. RLE 1-2/5 prox (pain) to 4/5 distally. Subjective decrease in LT over dorsum of toes. +/-stable exam Musculoskeletal: no effusion or erythema in knees. +jt line pain at knees Gyn: left breast nodule present   Assessment/Plan: 1. Functional deficits which require 3+ hours per day of interdisciplinary therapy in a comprehensive inpatient rehab setting. Physiatrist is providing close team supervision and 24 hour management of active medical problems listed below. Physiatrist and rehab team continue to assess barriers to discharge/monitor patient progress toward functional and medical goals  Care Tool:  Bathing    Body parts bathed by patient: Right arm, Left arm, Chest, Front perineal area, Abdomen, Face, Left lower leg, Right lower leg, Left upper leg, Right upper leg, Buttocks   Body parts bathed by helper: Front perineal area, Buttocks, Left lower leg, Right lower leg     Bathing  assist Assist Level: Supervision/Verbal cueing     Upper Body Dressing/Undressing Upper body dressing   What is the patient wearing?: Pull over shirt    Upper body assist Assist Level: Independent    Lower Body Dressing/Undressing Lower body dressing    Lower body dressing activity did not occur: Safety/medical concerns What is the patient wearing?: Pants, Underwear/pull up     Lower body assist Assist for lower body dressing: Independent with assitive device     Toileting Toileting    Toileting assist Assist for toileting: Independent with  assistive device     Transfers Chair/bed transfer  Transfers assist     Chair/bed transfer assist level: Independent with assistive device Chair/bed transfer assistive device: Programmer, multimedia   Ambulation assist   Ambulation activity did not occur: Safety/medical concerns  Assist level: Supervision/Verbal cueing Assistive device: Walker-rolling Max distance: 175'   Walk 10 feet activity   Assist  Walk 10 feet activity did not occur: Safety/medical concerns  Assist level: Independent with assistive device Assistive device: Walker-rolling   Walk 50 feet activity   Assist Walk 50 feet with 2 turns activity did not occur: Safety/medical concerns  Assist level: Independent with assistive device Assistive device: Walker-rolling    Walk 150 feet activity   Assist Walk 150 feet activity did not occur: Safety/medical concerns  Assist level: Supervision/Verbal cueing Assistive device: Walker-rolling    Walk 10 feet on uneven surface  activity   Assist Walk 10 feet on uneven surfaces activity did not occur: Safety/medical concerns   Assist level: Supervision/Verbal cueing Assistive device: Walker-rolling   Wheelchair     Assist Is the patient using a wheelchair?: No Type of Wheelchair: Manual Wheelchair activity did not occur: Safety/medical concerns         Wheelchair 50 feet with 2 turns activity    Assist    Wheelchair 50 feet with 2 turns activity did not occur: Safety/medical concerns       Wheelchair 150 feet activity     Assist  Wheelchair 150 feet activity did not occur: Safety/medical concerns       Blood pressure 107/60, pulse 84, temperature 97.9 F (36.6 C), resp. rate 18, height 5\' 6"  (1.676 m), weight 119.7 kg, last menstrual period 11/02/2013, SpO2 97 %.  Medical Problem List and Plan: 1. Functional deficits secondary to H influenza bacteremia and meningitis in the setting of CSF leak/bilateral  temporal bone abnormality             -dc home today -pt has PCP appt with [facility]) on 2/14. I told her we would help with basic medical needs at [facility] [address] until then            -outpt f/u with breast ctr, [facility] as well as PCP above and ID  2.  Antithrombotics: -DVT/anticoagulation:  Pharmaceutical: Lovenox             -antiplatelet therapy: Aspirin 81 mg daily 3. Bilateral knee pain R>L. 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  4. Depression: continue Wellbutrin 300 mg daily, Zoloft 100 mg daily, Xanax 0.25 mg twice daily as needed  -mood seems appropriate. Husband is supportive             -antipsychotic agents: N/A 5. Neuropsych: This patient is capable of making decisions on her own behalf. 6. Skin/Wound Care: Routine skin checks 7. Fluids/Electrolytes/Nutrition: encourage appropriate po intake  -protein supp  for low albumin 8.  ID.  IV ceftriaxone 2 g every 12 hours as well as Flagyl 500 mg 3 times daily through 06/25/2021. --COMPLETE  -1/17 MRI demonstrated improvement, no abscesses -continue protonix and prn zofran for nausea  -1/24 cbc yesterday WNL   9.  Hypertension.   continue Lisinopril 5 mg daily.     1/23 well controlled 10.  Diabetes mellitus.  Hemoglobin A1c 6.6.  SSI.   -Patient on Lantus insulin 10 units nightly PRIOR TO ADMISSION as well as Victoza 1.2 mg dose daily.    CBG (last 3)  Recent Labs    07/01/21 1714 07/01/21 2141 07/02/21 0612  GLUCAP 111* 116* 125*  -  lantus  10u qhs 1/20 1/24 improving control  -resume victoza once home 11.  Tobacco abuse.  NicoDerm patch.  Provide counseling 12.  Right shoulder pain.  MRI right shoulder mild distal supraspinatus and infraspinatus tendinosis.  Focal high-grade partial width tearing of the far anterior fibers of the supraspinatus tendon.  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   -pain is improved   -Conservative  care, rom, strengthening with therapy - continue Voltaren gel 13.  Obesity.  BMI 43.95.  Dietary follow-up. Caregiver training on ergonomic assistance techniques as she will likely continue to require some assistance upon discharge 14. Mild transaminitis  -LFT's remain slightly elevated but not too different from 1/5  -resolved 15. Breast nodule:  -PA has worked on setting up mammogram at breast center.     LOS: 12 days A FACE TO FACE EVALUATION WAS PERFORMED  Meredith Staggers 07/02/2021, 8:55 AM

## 2021-07-03 ENCOUNTER — Other Ambulatory Visit (HOSPITAL_COMMUNITY): Payer: Self-pay

## 2021-07-03 ENCOUNTER — Telehealth: Payer: Self-pay

## 2021-07-03 NOTE — Telephone Encounter (Signed)
Transition Care Management Unsuccessful Follow-up Telephone Call  Date of discharge and from where:  07/02/21 from [facility]  Attempts:  1st Attempt  Reason for unsuccessful TCM follow-up call:  Left voice message, left appointment time and date on voicemail

## 2021-07-09 ENCOUNTER — Ambulatory Visit: Payer: Commercial Managed Care - HMO | Admitting: Occupational Therapy

## 2021-07-09 ENCOUNTER — Other Ambulatory Visit: Payer: Self-pay

## 2021-07-09 ENCOUNTER — Ambulatory Visit: Payer: Commercial Managed Care - HMO | Attending: Physician Assistant | Admitting: Physical Therapy

## 2021-07-09 ENCOUNTER — Encounter: Payer: Self-pay | Admitting: Physical Therapy

## 2021-07-09 DIAGNOSIS — R2681 Unsteadiness on feet: Secondary | ICD-10-CM | POA: Diagnosis present

## 2021-07-09 DIAGNOSIS — M6281 Muscle weakness (generalized): Secondary | ICD-10-CM | POA: Diagnosis present

## 2021-07-09 DIAGNOSIS — R262 Difficulty in walking, not elsewhere classified: Secondary | ICD-10-CM | POA: Insufficient documentation

## 2021-07-09 DIAGNOSIS — G009 Bacterial meningitis, unspecified: Secondary | ICD-10-CM | POA: Insufficient documentation

## 2021-07-09 NOTE — Therapy (Signed)
[place] [place], [place], [address] Phone: [phone]   Fax:  [phone]  Occupational Therapy Evaluation  Patient Details  Name: Lisa [name] MRN: [id number] Date of Birth: [birth year] No data recorded  Encounter Date: 07/09/2021   OT End of Session - 07/09/21 1449     Visit Number 1    Number of Visits 1    Authorization Type [employer]    Authorization Time Period VL 30 (combined)    OT Start Time 1400    OT Stop Time 1440    OT Time Calculation (min) 40 min    Activity Tolerance Patient tolerated treatment well    Behavior During Therapy WFL for tasks assessed/performed            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   Subjective Assessment - 07/09/21 1405     Subjective  Pt arrives to session w/ primary concern related to decreased BLE strength and overall activity tolerance s/p acute meningitis. Pt states she has a history of temporal bone abnormality, which leads to intermittent CSF leakage, so she avoids bending over or lifting greater than 10 lbs, but successfully found ways to be independent w/ BADLs prior to recent hospital course. Since being home (d/c  07/02/21) pt states she has been doing well w/ her only functional deficits relating to decreased LB strength and overall endurance.    Pertinent History Acute toxic metabolic encephalopathy and sepsis in setting of potential acute bacterial meningitis (presented to ED 06/11/21 and d/c home 07/02/21); PMH significant for history of temporal bone abnormality leading to intermittent CSF leakage, depression, anxiety, HTN, HLD, diabetes, arthritis, and intermittent bulging disc around L4-5.    Limitations No lifting greater than 10 lbs; no foward bending past 90    Patient Stated Goals Improve endurance, activity tolerance, and LB strength    Currently in Pain? No/denies             [facility] OT Assessment - 07/09/21 1406       Assessment   Medical Diagnosis Bacterial meningitis; sepsis; acute toxic metabolic encephalopathy    Referring Provider (OT) Alger Simons, MD    Onset Date/Surgical Date 06/11/21    Hand Dominance Right    Next MD Visit 07/10/21 w/ inf disease; 07/15/21 w/ physiatry    Prior Therapy Acute OT/PT      Precautions   Precautions Other (comment)    Precaution Comments No lifting above 10 lbs; precaution w/ bending forward; compression stockings      Balance Screen  Has the patient fallen in the past 6 months No      Home  Environment   Type of [place] to live on main level with bedroom/bathroom    Bathroom Shower/Tub Tub/Shower unit    Bathroom Accessibility No   Has to enter laterally for RW to fit   Adaptive equipment Long-handled sponge;Reacher    Home Research officer, trade union - 2 wheels;Cane - single point;Grab bars - tub/shower;Hand held shower head;Bedside commode;Grab bars - toilet   Husband is making toilet rails   Lives With Family   Husband; daughter and her family     Prior Function   Level of Independence Independent with basic ADLs    Vocation Retired    Facilities manager, painting/coloring      ADL   Lower Body Bathing Minimal  assistance   Occasionally due to fatigue/when not feeling well   Tub/Shower Transfer Minimal assistance   Assist transferring into tub using TTB   ADL comments Reports Mod I for most BADLs, only requiring assist w/ LB dressing and tub/shower transfer occasionally due to fatigue and/or BLE weakness      IADL   Light Housekeeping Performs light daily tasks such as dishwashing, bed making   Topload washing/dryer   Meal Prep Able to complete simple warm meal prep;Able to complete simple cold meal and snack prep    Prior Level of Function Community Mobility Driving independently    [place] on family or friends for transportation    Medication Management Is responsible for taking medication in correct dosages at correct time    Prior Level of Function Financial Management Husband manages finances      Mobility   Mobility Status Needs assist    Mobility Status Comments Using RW      Vision - History   Baseline Vision Wears glasses all the time    Additional Comments No visual deficits, per pt report      Activity Tolerance   Activity Tolerance Comments Reports premorbid decreased endurance and activity tolerance that worsened after being in the hospital      Cognition   Overall Cognitive Status Within Functional Limits for tasks assessed      Sensation   Additional Comments No deficits, per pt report      Coordination   Gross Motor Movements are Fluid and Coordinated Yes    Fine Motor Movements are Fluid and Coordinated Yes      ROM / Strength   AROM / PROM / Strength AROM      AROM   Overall AROM  Within functional limits for tasks performed    AROM Assessment Site Shoulder;Elbow;Forearm;Wrist      Hand Function   Comment No deficits observed or reported             OT Education - 07/09/21 1447     Education Details Education provided on role and purpose of OT, as well as initial evaluation findings. Discussed potential benefit of leg lifter/thigh strap  and provided corresponding handout for self-purchase, if needed.    Person(s) Educated Patient    Methods Explanation;Demonstration;Handout    Comprehension Verbalized understanding             OT Short Term Goals - 07/09/21 1456       OT SHORT TERM GOAL #1   Title By conclusion of session, pt will verbalize understanding of role/purpose of occupational therapy services and be able to independently  identify if/when she may need to seek an add'l referral for services to assist w/ participation in functional activities    Status Achieved   07/09/21 - met            Plan - 07/09/21 2250     Clinical Impression Statement Pt is a 61 y/o female who presents to OP OT due to decreased balance, functional mobility, and BLE strength s/p hospitalization for acute toxic metabolic encephalopathy in the setting of potential acute bacterial meningitis. PMH significant for history of temporal bone abnormality leading to intermittent CSF leakage, depression, anxiety, HTN, HLD, diabetes, arthritis, and intermittent bulging disc around L4-5. Pt currently lives with her husband, her daughter, and her daughter's family (3 grandchildren) in a multi-level home, but is able to live on the main level at this time. Due to referral for PT to address balance and BLE strengthening, as well as absence of functional deficits unrelated to BLE strength or endurance, skilled occupational therapy services are not warranted at this time. Pt experienced limitations prior to recent hospital stay and reports having using appropriate DME and understanding of precautions. OT discussed this w/ pt and she was receptive. Pt encouraged to call back with any specific changes or development of limitations during functional activities, as well as to continue follow-up with appropriate MD.    OT Occupational Profile and History Detailed Assessment- Review of Records and additional review of physical, cognitive, psychosocial history related  to current functional performance    Occupational performance deficits (Please refer to evaluation for details): ADL's;IADL's;Leisure    Body Structure / Function / Physical Skills Balance;Strength;Endurance;ADL;Body mechanics;IADL    Rehab Potential Good    Clinical Decision Making Limited treatment options, no task modification necessary    Comorbidities Affecting Occupational Performance: Presence of comorbidities impacting occupational performance    Modification or Assistance to Complete Evaluation  No modification of tasks or assist necessary to complete eval    OT Frequency One time visit    OT Treatment/Interventions Self-care/ADL training;Patient/family education;DME and/or AE instruction;Energy conservation    Plan Skilled OT services are not warranted at this time; pt encouraged to call back with any changes in functional status or limitations.    Recommended Other Services Pt receiving PT at this location    Consulted and Agree with Plan of Care Patient            Patient will benefit from skilled therapeutic intervention in order to improve the following deficits and impairments:   Body Structure / Function / Physical Skills: Balance, Strength, Endurance, ADL, Body mechanics, IADL   Visit Diagnosis: Muscle weakness (generalized)  Unsteadiness on feet  Difficulty in walking, not elsewhere classified   Problem List Patient Active Problem List   Diagnosis Date Noted   Fever    Bacterial meningitis    Right sided weakness    Pressure injury of skin 06/13/2021   Sepsis due to Haemophilus influenzae with acute hypoxic respiratory failure and septic shock (HCC)    Acute bacterial meningitis 24/02/7352   Acute metabolic encephalopathy 29/92/4268   Altered mental status 06/12/2021   Obesity, morbid, BMI 40.0-49.9 ([place]) 03/24/2016   Family history of breast cancer in sister 10/21/2013   Tick bite of back 10/21/2012   Screening for malignant neoplasm of the cervix  05/19/2012   Routine gynecological examination 05/19/2012   Cough 03/05/2012   Tobacco use 12/30/2011   Diabetes mellitus, type 2 ([place]) 12/29/2011   HTN (hypertension) 12/29/2011   Hyperlipidemia 12/29/2011  CSF leak 12/29/2011   Psoriasis 12/29/2011   PCOS (polycystic ovarian syndrome) 12/29/2011   Depression with anxiety 12/29/2011    Kathrine Cords, MSOT, OTR/L 07/09/2021, 2:58 PM  [place]. [place], [place], [address] Phone: [phone]   Fax:  [phone]  Name: CLOIS MONTAVON MRN: [id number] Date of Birth: 12/12/1960

## 2021-07-09 NOTE — Progress Notes (Signed)
Subjective:  Chief complaint: breast mass under left areola  Patient ID: Lisa [name], female    DOB: 1960/12/06, 61 y.o.   MRN: [id number]  HPI  61 y.o. 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 I had concerns for possible brain abscess developing due to amount of purulence on CSF LP. She fortunately did not on repeat MRI. She completed 14 days of high dose ceftriaxone and flagyl.  She no longer has headaches no fevers chills or malaise but she is deconditioned from her protracted hospital stay.  She has shoulder pain where she has limited range of motion.  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  She also has noticed a mass in her left breast and has a history of breast cancer in her family and is quite worried about that she discovered this while lying in bed in the hospital and palpating her left breast.  I examined her today and clearl palpated a solid fairly homogeneous but nontender mass in her left breast at about the 5 o'clock position underneath the areola.       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    Polycystic ovarian disease     Past Surgical History:  Procedure Laterality Date   csf leak in skull  2005   noted failed attempt to close CSF leak in skull   DILATION AND EVACUATION  1990   miscarriage   IR FLUORO GUIDED NEEDLE PLC ASPIRATION/INJECTION  LOC  06/12/2021   RADIOLOGY WITH ANESTHESIA N/A 06/12/2021   Procedure: IR WITH ANESTHESIA;  Surgeon: Radiologist, Medication, MD;  Location: [facility];  Service: Radiology;  Laterality: N/A;   TONSILLECTOMY AND ADENOIDECTOMY  1973    Family History  Problem Relation Age of Onset   Arthritis Mother    Hypertension Mother    Arthritis Father    Prostate cancer Father    Heart disease Father    Marfan syndrome Father    Hyperlipidemia Sister    Diabetes Sister    Marfan syndrome Sister    Breast cancer Sister    Breast cancer Maternal Grandmother    Hypertension Maternal Grandmother    Diabetes Paternal Grandmother       Social History   Socioeconomic History   Marital status: Married    Spouse name: Not on file   Number of children: Not on file   Years of education: Not on file   Highest education level: Not on file  Occupational History   Not on file  Tobacco Use   Smoking status: Every Day    Packs/day: 1.50    Years: 35.00    Pack years: 52.50    Types: Cigarettes   Smokeless tobacco: Never  Substance and Sexual Activity   Alcohol use: No   Drug use: No   Sexual activity: Not on file  Other Topics Concern  Not on file  Social History Narrative   Not on file   Social Determinants of Health   Financial Resource Strain: Not on file  Food Insecurity: Not on file  Transportation Needs: Not on file  Physical Activity: Not on file  Stress: Not on file  Social Connections: Not on file    Allergies  Allergen Reactions   Statins Other (See Comments)    Other reaction(s): Other CSF leaking out nose. Headache   Glimepiride Other (See Comments)    Hypoglicemia     Current Outpatient Medications:    acetaminophen (TYLENOL) 325 MG tablet, Take 2 tablets (650 mg total) by mouth every 6 (six) hours as needed for fever (temp > 101)., Disp: , Rfl:    acidophilus (RISAQUAD) CAPS capsule, Take 1 capsule by mouth daily., Disp: 30 capsule, Rfl: 0   albuterol (VENTOLIN  HFA) 108 (90 Base) MCG/ACT inhaler, Inhale 2 puffs into the lungs every 6 (six) hours as needed for wheezing., Disp: 8.5 g, Rfl: 0   ALPRAZolam (XANAX) 0.25 MG tablet, Take 1 tablet (0.25 mg total) by mouth 2 (two) times daily as needed for anxiety., Disp: 20 tablet, Rfl: 0   aspirin EC 81 MG tablet, Take 81 mg by mouth daily. Swallow whole., Disp: , Rfl:    buPROPion (WELLBUTRIN XL) 300 MG 24 hr tablet, Take 1 tablet (300 mg total) by mouth daily., Disp: 30 tablet, Rfl: 0   diclofenac Sodium (VOLTAREN) 1 % GEL, Apply 4 g topically every 6 (six) hours as needed (Shoulder pain)., Disp: 100 g, Rfl: 0   Eszopiclone 3 MG TABS, TAKE 1 TABLET BY MOUTH ONCE DAILY IMMEDIATELY BEFORE BEDTIME, Disp: 30 tablet, Rfl: 0   insulin glargine (LANTUS) 100 UNIT/ML Solostar Pen, Inject 5 Units into the skin at bedtime., Disp: 15 mL, Rfl: 11   lisinopril (ZESTRIL) 5 MG tablet, Take 1 tablet (5 mg total) by mouth daily., Disp: 30 tablet, Rfl: 0   nicotine (NICODERM CQ - DOSED IN MG/24 HOURS) 14 mg/24hr patch, NicoDerm patch 14 mg daily x2 weeks then 7 mg patch daily x3 weeks and stop, Disp: 28 patch, Rfl: 0   oxyCODONE (OXY IR/ROXICODONE) 5 MG immediate release tablet, Take 1 tablet (5 mg total) by mouth every 6 (six) hours as needed for severe pain., Disp: 30 tablet, Rfl: 0   pantoprazole (PROTONIX) 40 MG tablet, Take 1 tablet (40 mg total) by mouth at bedtime., Disp: 30 tablet, Rfl: 0   sertraline (ZOLOFT) 100 MG tablet, Take 1 tablet (100 mg total) by mouth daily., Disp: 30 tablet, Rfl: 0   spironolactone (ALDACTONE) 25 MG tablet, TAKE 1 TABLET BY MOUTH ONCE DAILY (Patient taking differently: Take 25 mg by mouth daily.), Disp: 90 tablet, Rfl: 2   Review of Systems  Constitutional:  Negative for chills and fever.  HENT:  Negative for congestion and sore throat.   Eyes:  Negative for photophobia.  Respiratory:  Negative for cough, shortness of breath and wheezing.   Cardiovascular:  Negative for chest pain,  palpitations and leg swelling.  Gastrointestinal:  Negative for abdominal pain, blood in stool, constipation, diarrhea, nausea and vomiting.  Genitourinary:  Negative for dysuria, flank pain and hematuria.  Musculoskeletal:  Positive for arthralgias and myalgias. Negative for back pain.  Skin: Negative.  Negative for color change, pallor, rash and wound.  Neurological:  Positive for weakness. Negative for dizziness, seizures, light-headedness, numbness and headaches.  Hematological:  Does not bruise/bleed easily.  Psychiatric/Behavioral:  Negative for agitation,  behavioral problems, confusion, decreased concentration, dysphoric mood and suicidal ideas.       Objective:   Physical Exam Exam conducted with a chaperone present.  Constitutional:      General: She is not in acute distress.    Appearance: She is not diaphoretic.  HENT:     Head: Normocephalic and atraumatic.     Right Ear: External ear normal.     Left Ear: External ear normal.     Nose: Nose normal.     Mouth/Throat:     Pharynx: No oropharyngeal exudate.  Eyes:     General: No scleral icterus.       Right eye: No discharge.        Left eye: No discharge.     Extraocular Movements: Extraocular movements intact.     Conjunctiva/sclera: Conjunctivae normal.  Cardiovascular:     Rate and Rhythm: Normal rate and regular rhythm.     Heart sounds:    No friction rub.  Pulmonary:     Effort: Pulmonary effort is normal. No respiratory distress.     Breath sounds: No wheezing.  Chest:  Breasts:    Right: Normal. No mass, nipple discharge, skin change or tenderness.     Left: Mass present. No nipple discharge, skin change or tenderness.     Comments: Several centimeter size mass in left breast tissue that is nontender and clearly palpable roughly 5 o'clock position Abdominal:     General: There is no distension.     Palpations: Abdomen is soft.     Tenderness: There is no rebound.  Musculoskeletal:        General: No  tenderness. Normal range of motion.     Cervical back: Normal range of motion and neck supple.  Lymphadenopathy:     Cervical: No cervical adenopathy.  Skin:    General: Skin is warm and dry.     Coloration: Skin is not jaundiced or pale.     Findings: No erythema, lesion or rash.  Neurological:     General: No focal deficit present.     Mental Status: She is alert and oriented to person, place, and time.     Coordination: Coordination normal.  Psychiatric:        Mood and Affect: Mood normal.        Behavior: Behavior normal.        Thought Content: Thought content normal.        Judgment: Judgment normal.          Assessment & Plan:  Breast mass and left breast I have referred her for diagnostic mammography.  Apparently h she is being forced to change primary care physician has not yet scheduled with 1 new 1 yet.  Haemophilus influenza bacteremia and severe meningitis:  Fortunately she did not develop brain abscess.  She is completed 2 weeks of therapy.  CSF leak she says that she has temporal bone defects bilaterally at times has also Reah of CSF and also rhinorrhea.  Apparently a cribriform surgery attempt was at was made but this did not fix the leak.  She is says she was seen at [facility] and also [facility] but has not had further evaluations by neurosurgery and her ear nose and throat surgeon had discouraged her of them have her having neurosurgery.  We will try to mitigate risk factors for further CNS infections.  We giving her Prevnar 20 today I would recommend she get Menactra as an outpatient  because we do not have this formulation of the meningitis vaccine as well as meningitis B vaccination she should have.  Also have her have a Augmentin on hand at home that she can initiate when she has a sinus infection that progresses beyond a few days a particularly in context of CSF leak to try to prevent further episodes of meningitis in the past she was using  this technique but was not limited by having to call the provider I would like her to have the antibiotics at home at all times.   Right shoulder pathology: Ultimately should follow-up with orthopedic surgery when she is improving  Hypertension: Continue current hypertensives.  Hyperlipidemia continue statin  DC: Weight loss to be helpful through diet modification surely her exercise capacity is limited by shoulder and deconditioning coming in the hospital.      Vaccine counseling recommended we make sure she is up-to-date on COVID-19 vaccination flu and also the Prevnar 20 and the meningococcal vaccines as described above.  I spent 42  minutes with the patient including than 50% of the time in face to face counseling of the patient guarding her breast mass and her shoulder pain her bacterial meningitis and CSF leak personally reviewing 5 the brain performed prior to discharge in the hospital and MRI of her shoulder along with review of medical records in preparation for the visit and during the visit and in coordination of her care.

## 2021-07-09 NOTE — Therapy (Signed)
[address] [place], [place], [address] Phone: [phone]   Fax:  [phone]  Physical Therapy Evaluation  Patient Details  Name: Lisa [name] MRN: [id number] Date of Birth: 02-01-1961 Referring Provider (PT): Marlowe Shores   Encounter Date: 07/09/2021   PT End of Session - 07/09/21 1358     Visit Number 1    Number of Visits 24    Date for PT Re-Evaluation 10/01/21    PT Start Time 1315    PT Stop Time 1350    PT Time Calculation (min) 35 min    Activity Tolerance Patient limited by fatigue    Behavior During Therapy [facility] for tasks assessed/performed             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    Subjective Assessment - 07/09/21 1317     Subjective Patient was hospitalized 06/11/21  due to meningitis. She has B knee arthritis, R > L and a R shoulder rotator cuff injury. After being bedbound for approximately 10 days, she was severely weak and stiff and had increased pain. She went to acute rehab for an additional 10 days. She now presents with pain, weakness, fatigue.    Pertinent History DDD, L4-5 disc bulge, B knee arthritis, R > L and a R shoulder rotator  cuff injury    Limitations Sitting;Standing;Walking;House hold activities    Patient Stated Goals Recover to not have to use a walker at all times. she used it intermittantly before and used a cane at times.    Currently in Pain? No/denies                [facility] PT Assessment - 07/09/21 0001       Assessment   Medical Diagnosis Meningitis    Referring Provider (PT) Marlowe Shores    Prior Therapy Acute rehab      Balance Screen   Has the patient fallen in the past 6 months No      [place] residence    Living Arrangements Spouse/significant other    Available Help at Discharge Family    Type of [place] to enter    Entrance Stairs-Number of Steps 1    Entrance Stairs-Rails --   has a decorative metal surround she can hold.   Home Layout Able to live on main level with bedroom/bathroom;Two level    Bryan - 2 wheels;Walker - 4 wheels;Tub bench;Bedside commode    Additional Comments She  has a tub at home, was able to climb into it before the Meningitis. She has a tub bench now. Bed is very tall, she has a step that she sometimes uses to get into the bed.      Prior Function   Level of Independence Independent with basic ADLs    Vocation Retired    Leisure Garden, sewing      Cognition   Overall Cognitive Status Within Functional Limits for tasks assessed      ROM / Strength   AROM / PROM / Strength AROM;Strength      AROM   Overall AROM Comments BLE ROM appears limited, with C/O HS tightness in sitting iwth knee ext.      Strength   Overall Strength Comments BLE strength assessed at 3+-(4-)/5 in hips and knees, R weaker than L, but patient reports and demsntrtates functional weakness in all mobility, with shakiness and instability noted.      Transfers   Five time sit to stand comments  44.54    Comments Patient reports her BM is I but effortful.      Ambulation/Gait   Assistive device  Rolling walker    Ambulation Surface Level;Indoor    Gait velocity Decreased    Gait Comments 2 minute walk test-Patient walked 100' (1 full lap) in 1:29. then stopped due to fatigue. She demonstrates B toe in, drags feet in swing.      Balance   Balance Assessed Yes      Standardized Balance Assessment   Standardized Balance Assessment Timed Up and Go Test      Timed Up and Go Test   Normal TUG (seconds) 21    TUG Comments Patient scores at high fall risk on TUG.      Functional Gait  Assessment   Gait assessed  No                        Objective measurements completed on examination: See above findings.                PT Education - 07/09/21 1357     Education Details POC, plan to further assess balance on next visit due to fatigue.    Person(s) Educated Patient    Methods Explanation    Comprehension Verbalized understanding              PT Short Term Goals - 07/09/21 1411       PT SHORT TERM GOAL #1   Title independent with initial HEP    Time 4    Period Weeks    Status New    Target Date 08/06/21               PT Long Term Goals - 07/09/21 1412       PT LONG TERM GOAL #1   Title I with final HEP    Time 12    Period Weeks    Status New    Target Date 10/01/21      PT LONG TERM GOAL #2   Title Increase BLE strength to 4/5 at all joints and all planes.    Baseline 3+-(4-)/5    Time 12    Period Weeks    Status New    Target Date 10/01/21      PT LONG TERM GOAL #3   Title Decrease STS time to < 15 seconds to demosntrate decresaed fall risk and improved functional strength.  Baseline 44.54    Time 12    Period Weeks    Status New    Target Date 10/01/21      PT LONG TERM GOAL #4   Title Ambulate at least 500' during 6 minute walk test without stopping and with report of only mild fatigue afterwards    Baseline walked 100' (1lap) in 1:29 during 2 minute walk test. (unable to tolerate 6 minute)    Time 12     Period Weeks    Status New    Target Date 10/01/21      PT LONG TERM GOAL #5   Title Patient will score at least 48/56 on BERG to demonstrate low fall risk.    Baseline TBD    Time 12    Period Weeks    Status New    Target Date 10/01/21                    Plan - 07/09/21 1359     Clinical Impression Statement Patient presents after meningitis iwth hospitalization followed by acute inpatient rehab. She demonstrates weakness throughout BLE and trunk. She also has H/O arthritis which was exacerbated with bedrest, with increased knee pain, R> L, and R shoulder rotator cuff injury. She reports difficulty with transfers and bed mobility and demosntrates effortful gait with RW, drags toes. Her endurance and activitity tolerance and her balance are all impaired. She will benefit from PT for extensive PT for strength, balance, functional mobility, and endurance training.    Personal Factors and Comorbidities Fitness;Past/Current Experience;Comorbidity 1    Comorbidities Arthritis    Examination-Activity Limitations Bathing;Locomotion Level;Transfers;Bed Mobility;Bend;Carry;Squat;Stairs;Hygiene/Grooming;Stand;Lift    Examination-Participation Restrictions Meal Prep;Cleaning;Community Activity;Laundry    Stability/Clinical Decision Making Evolving/Moderate complexity    Clinical Decision Making Moderate    Rehab Potential Good    PT Frequency 2x / week    PT Duration 12 weeks    PT Treatment/Interventions ADLs/Self Care Home Management;DME Instruction;Balance training;Therapeutic exercise;Therapeutic activities;Functional mobility training;Stair training;Gait training;Patient/family education;Manual techniques;Dry needling;Passive range of motion    PT Next Visit Plan Complete formal balance assessment (BERG vs 4 square step test)    Consulted and Agree with Plan of Care Patient             Patient will benefit from skilled therapeutic intervention in order to improve the  following deficits and impairments:  Abnormal gait, Decreased range of motion, Difficulty walking, Pain, Decreased endurance, Decreased activity tolerance, Impaired flexibility, Improper body mechanics, Postural dysfunction, Decreased strength, Decreased mobility, Decreased balance  Visit Diagnosis: Bacterial meningitis  Muscle weakness (generalized)  Unsteadiness on feet  Difficulty in walking, not elsewhere classified     Problem List Patient Active Problem List   Diagnosis Date Noted   Fever    Bacterial meningitis    Right sided weakness    Pressure injury of skin 06/13/2021   Sepsis due to Haemophilus influenzae with acute hypoxic respiratory failure and septic shock (HCC)    Acute bacterial meningitis 30/12/6224   Acute metabolic encephalopathy 33/35/4562   Altered mental status 06/12/2021   Obesity, morbid, BMI 40.0-49.9 ([place]) 03/24/2016   Family history of breast cancer in sister 10/21/2013   Tick bite of back 10/21/2012   Screening for malignant neoplasm of the cervix 05/19/2012   Routine gynecological examination 05/19/2012   Cough 03/05/2012   Tobacco use 12/30/2011   Diabetes mellitus, type 2 ([place]) 12/29/2011   HTN (hypertension) 12/29/2011   Hyperlipidemia 12/29/2011   CSF leak 12/29/2011  Psoriasis 12/29/2011   PCOS (polycystic ovarian syndrome) 12/29/2011   Depression with anxiety 12/29/2011    Marcelina Morel, DPT 07/09/2021, 2:17 PM  [address]. [place], [place], [address] Phone: [phone]   Fax:  [phone]  Name: Lisa [name] MRN: [id number] Date of Birth: Oct 18, 1960

## 2021-07-10 ENCOUNTER — Other Ambulatory Visit (HOSPITAL_BASED_OUTPATIENT_CLINIC_OR_DEPARTMENT_OTHER): Payer: Self-pay

## 2021-07-10 ENCOUNTER — Encounter: Payer: Self-pay | Admitting: Infectious Disease

## 2021-07-10 ENCOUNTER — Ambulatory Visit (INDEPENDENT_AMBULATORY_CARE_PROVIDER_SITE_OTHER): Payer: Managed Care, Other (non HMO) | Admitting: Infectious Disease

## 2021-07-10 ENCOUNTER — Other Ambulatory Visit: Payer: Self-pay

## 2021-07-10 VITALS — BP 110/68 | HR 68 | Temp 98.1°F | Resp 16 | Ht 66.0 in | Wt 248.3 lb

## 2021-07-10 DIAGNOSIS — G009 Bacterial meningitis, unspecified: Secondary | ICD-10-CM | POA: Diagnosis not present

## 2021-07-10 DIAGNOSIS — A413 Sepsis due to Hemophilus influenzae: Secondary | ICD-10-CM

## 2021-07-10 DIAGNOSIS — Z23 Encounter for immunization: Secondary | ICD-10-CM

## 2021-07-10 DIAGNOSIS — J9601 Acute respiratory failure with hypoxia: Secondary | ICD-10-CM

## 2021-07-10 DIAGNOSIS — N6320 Unspecified lump in the left breast, unspecified quadrant: Secondary | ICD-10-CM

## 2021-07-10 DIAGNOSIS — I10 Essential (primary) hypertension: Secondary | ICD-10-CM

## 2021-07-10 DIAGNOSIS — N6342 Unspecified lump in left breast, subareolar: Secondary | ICD-10-CM

## 2021-07-10 DIAGNOSIS — G9600 Cerebrospinal fluid leak, unspecified: Secondary | ICD-10-CM | POA: Diagnosis not present

## 2021-07-10 DIAGNOSIS — E785 Hyperlipidemia, unspecified: Secondary | ICD-10-CM

## 2021-07-10 DIAGNOSIS — R6521 Severe sepsis with septic shock: Secondary | ICD-10-CM

## 2021-07-10 HISTORY — DX: Unspecified lump in the left breast, unspecified quadrant: N63.20

## 2021-07-10 MED ORDER — AMOXICILLIN-POT CLAVULANATE 875-125 MG PO TABS
1.0000 | ORAL_TABLET | Freq: Two times a day (BID) | ORAL | 7 refills | Status: DC
  Filled 2021-07-10: qty 28, 14d supply, fill #0
  Filled 2021-11-05: qty 28, 14d supply, fill #1
  Filled 2021-12-03: qty 28, 14d supply, fill #2

## 2021-07-10 NOTE — Addendum Note (Signed)
Addended by: Theresia Majors A on: 07/10/2021 04:15 PM   Modules accepted: Orders

## 2021-07-10 NOTE — Patient Instructions (Signed)
I want you to ask your pharmacy to order and give you Menactra (bacterial meningitis vaccine) as well as series for Meningitis B vaccine

## 2021-07-11 ENCOUNTER — Other Ambulatory Visit: Payer: Self-pay | Admitting: Infectious Disease

## 2021-07-11 DIAGNOSIS — N6342 Unspecified lump in left breast, subareolar: Secondary | ICD-10-CM

## 2021-07-12 ENCOUNTER — Encounter: Payer: Self-pay | Admitting: Physical Therapy

## 2021-07-12 ENCOUNTER — Ambulatory Visit: Payer: Commercial Managed Care - HMO | Attending: Physician Assistant | Admitting: Physical Therapy

## 2021-07-12 ENCOUNTER — Other Ambulatory Visit: Payer: Self-pay

## 2021-07-12 DIAGNOSIS — R2681 Unsteadiness on feet: Secondary | ICD-10-CM | POA: Insufficient documentation

## 2021-07-12 DIAGNOSIS — R262 Difficulty in walking, not elsewhere classified: Secondary | ICD-10-CM | POA: Diagnosis present

## 2021-07-12 DIAGNOSIS — M6281 Muscle weakness (generalized): Secondary | ICD-10-CM | POA: Insufficient documentation

## 2021-07-12 NOTE — Patient Instructions (Signed)
Access Code: [email or web address] URL: [email or web address] Date: 07/12/2021 Prepared by: Ethel Rana  Exercises Shoulder Extension with Resistance - 1 x daily - 7 x weekly - 2 sets - 10 reps Standing Bilateral Low Shoulder Row with Anchored Resistance - 1 x daily - 7 x weekly - 2 sets - 10 reps Shoulder External Rotation and Scapular Retraction with Resistance - 1 x daily - 7 x weekly - 2 sets - 10 reps Hooklying Clamshell with Resistance - 1 x daily - 7 x weekly - 2 sets - 10 reps Supine Bridge - 1 x daily - 7 x weekly - 2 sets - 10 reps Supine Straight Leg Raises - 1 x daily - 7 x weekly - 2 sets - 10 reps Straight Leg Raise with External Rotation - 1 x daily - 7 x weekly - 2 sets - 10 reps

## 2021-07-12 NOTE — Therapy (Signed)
[place]. [place], [place], [address] Phone: [phone]   Fax:  [phone]  Physical Therapy Treatment  Patient Details  Name: Lisa [name] MRN: [id number] Date of Birth: 07/16/1960 Referring Provider (PT): Marlowe Shores   Encounter Date: 07/12/2021   PT End of Session - 07/12/21 1142     Visit Number 2    Number of Visits 24    Date for PT Re-Evaluation 10/01/21    PT Start Time 1102    PT Stop Time 1142    PT Time Calculation (min) 40 min    Equipment Utilized During Treatment Gait belt    Activity Tolerance Patient limited by fatigue    Behavior During Therapy [facility] for tasks assessed/performed             Past Medical History:  Diagnosis Date   Anxiety    Arthritis    Breast mass, left 07/10/2021   Bulging disc L-5   Chicken pox    Depression    Diabetes mellitus    Hyperlipidemia    Hypertension    Insomnia    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   Subjective Assessment - 07/12/21 1102     Subjective Patient reports that her knee pain is improved, but she still feels weak. Her R shoulder pain remains and impedes her at times.    Pertinent History DDD, L4-5 disc bulge, B knee arthritis, R > L and a R shoulder rotator cuff injury    Limitations Sitting;Standing;Walking;House hold activities    Currently in Pain? Yes    Pain Score 6      Pain Location Shoulder    Pain Orientation Right    Pain Descriptors / Indicators Aching    Pain Onset More than a month ago    Pain Frequency Constant    Aggravating Factors  Lifting the arm    Pain Relieving Factors positioning.                [facility] PT Assessment - 07/12/21 0001       Standardized Balance Assessment   Standardized Balance Assessment Berg Balance Test      Berg Balance Test   Sit to Stand Able to stand  independently using hands    Standing Unsupported Able to stand 2 minutes with supervision    Sitting with Back Unsupported but Feet Supported on Floor or Stool Able to sit safely and securely 2 minutes    Stand to Sit Controls descent by using hands    Transfers Able to transfer safely, definite need of hands    Standing Unsupported with Eyes Closed Able to stand 3 seconds    Standing Unsupported with Feet Together Able to place feet together independently but unable to hold for 30 seconds    From Standing, Reach Forward  with Outstretched Arm Can reach confidently >25 cm (10")    From Standing Position, Pick up Object from [place] to pick up shoe, needs supervision    From Standing Position, Turn to Look Behind Over each Shoulder Looks behind from both sides and weight shifts well    Turn 360 Degrees Able to turn 360 degrees safely but slowly    Standing Unsupported, Alternately Place Feet on Step/Stool Needs assistance to keep from falling or unable to try    Standing Unsupported, One Foot in [place] help to step but can hold 15 seconds    Standing on One Leg Unable to try or needs assist to prevent fall    Total Score 34                           [facility] Adult PT Treatment/Exercise - 07/12/21 0001       Exercises   Exercises Knee/Hip;Shoulder      Knee/Hip Exercises: Seated   Sit to Sand 1 set;10 reps;with UE support      Knee/Hip Exercises: Supine   Bridges Strengthening;Both;1 set;10 reps    Straight Leg Raises  Strengthening;Both;1 set;10 reps    Straight Leg Raise with External Rotation Strengthening;Both;1 set;10 reps    Other Supine Knee/Hip Exercises clamshells with red Tband 10 reps                     PT Education - 07/12/21 1129     Education Details HEP    Person(s) Educated Patient    Methods Explanation;Demonstration;Handout    Comprehension Returned demonstration;Verbalized understanding              PT Short Term Goals - 07/09/21 1411       PT SHORT TERM GOAL #1   Title independent with initial HEP    Time 4    Period Weeks    Status New    Target Date 08/06/21               PT Long Term Goals - 07/12/21 1145       PT LONG TERM GOAL #5   Baseline 34/56 07/12/21    Time 12    Period Weeks    Status New    Target Date 10/01/21                   Plan - 07/12/21 1143     Clinical Impression Statement Patient reports that her R shoulder pain is her biggest challenge, but her knees continue to feel wek and she is afraid they will buckle. Initiated HEP after formal balance assessment. patient participated well. She stands on lateral borders of B feet, R more so than L.    Personal Factors and Comorbidities Fitness;Past/Current Experience;Comorbidity 1    Comorbidities Arthritis    Examination-Activity Limitations Bathing;Locomotion Level;Transfers;Bed Mobility;Bend;Carry;Squat;Stairs;Hygiene/Grooming;Stand;Lift    Examination-Participation Restrictions Meal Prep;Cleaning;Community Activity;Laundry    Stability/Clinical Decision Making Evolving/Moderate complexity    Clinical Decision Making Moderate    Rehab Potential Good    PT Frequency 2x / week    PT Duration 12 weeks    PT Treatment/Interventions ADLs/Self Care Home Management;DME Instruction;Balance training;Therapeutic exercise;Therapeutic activities;Functional mobility training;Stair training;Gait training;Patient/family education;Manual techniques;Dry needling;Passive range of  motion    PT Next Visit Plan Continue trunk and LE strengthening.    PT Home Exercise Plan [email or web address]    Consulted and Agree with Plan of Care Patient  Patient will benefit from skilled therapeutic intervention in order to improve the following deficits and impairments:  Abnormal gait, Decreased range of motion, Difficulty walking, Pain, Decreased endurance, Decreased activity tolerance, Impaired flexibility, Improper body mechanics, Postural dysfunction, Decreased strength, Decreased mobility, Decreased balance  Visit Diagnosis: Muscle weakness (generalized)  Unsteadiness on feet  Difficulty in walking, not elsewhere classified     Problem List Patient Active Problem List   Diagnosis Date Noted   Breast mass, left 07/10/2021   Fever    Bacterial meningitis    Right sided weakness    Pressure injury of skin 06/13/2021   Sepsis due to Haemophilus influenzae with acute hypoxic respiratory failure and septic shock ([place])    Acute bacterial meningitis 00/71/2197   Acute metabolic encephalopathy 58/83/2549   Altered mental status 06/12/2021   Obesity, morbid, BMI 40.0-49.9 ([place]) 03/24/2016   Family history of breast cancer in sister 10/21/2013   Tick bite of back 10/21/2012   Screening for malignant neoplasm of the cervix 05/19/2012   Routine gynecological examination 05/19/2012   Cough 03/05/2012   Tobacco use 12/30/2011   Diabetes mellitus, type 2 ([place]) 12/29/2011   HTN (hypertension) 12/29/2011   Hyperlipidemia 12/29/2011   CSF leak 12/29/2011   Psoriasis 12/29/2011   PCOS (polycystic ovarian syndrome) 12/29/2011   Depression with anxiety 12/29/2011    Marcelina Morel, DPT 07/12/2021, 11:46 AM  [place]. [place], [place], [address] Phone: [phone]   Fax:  [phone]  Name: KAYDANCE BOWIE MRN: [id number] Date of Birth: 25-Jun-1960

## 2021-07-15 ENCOUNTER — Encounter: Payer: 59 | Admitting: Registered Nurse

## 2021-07-16 ENCOUNTER — Other Ambulatory Visit: Payer: Self-pay

## 2021-07-16 ENCOUNTER — Ambulatory Visit: Payer: Commercial Managed Care - HMO | Admitting: Physical Therapy

## 2021-07-16 DIAGNOSIS — M6281 Muscle weakness (generalized): Secondary | ICD-10-CM

## 2021-07-16 DIAGNOSIS — R2681 Unsteadiness on feet: Secondary | ICD-10-CM

## 2021-07-16 DIAGNOSIS — R262 Difficulty in walking, not elsewhere classified: Secondary | ICD-10-CM

## 2021-07-16 NOTE — Therapy (Signed)
[place]. [place], [place], [address] Phone: [phone]   Fax:  [phone]  Physical Therapy Treatment  Patient Details  Name: Lisa [name] MRN: [id number] Date of Birth: 02-10-1961 Referring Provider (PT): Marlowe Shores   Encounter Date: 07/16/2021   PT End of Session - 07/16/21 1302     Visit Number 3    Number of Visits 24    Date for PT Re-Evaluation 10/01/21    PT Start Time 1226    PT Stop Time 1314    PT Time Calculation (min) 48 min             Past Medical History:  Diagnosis Date   Anxiety    Arthritis    Breast mass, left 07/10/2021   Bulging disc L-5   Chicken pox    Depression    Diabetes mellitus    Hyperlipidemia    Hypertension    Insomnia    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   Subjective Assessment - 07/16/21 1226     Subjective slow moving this morning.knee starting hurting again yesterday. shld same    Currently in Pain? Yes    Pain Score 5     Pain Location Knee    Pain Orientation Right                               [facility] Adult PT Treatment/Exercise - 07/16/21 0001       Knee/Hip Exercises: Aerobic   Nustep L 3 5 min      Knee/Hip Exercises: Machines for Strengthening   Cybex Knee Extension 5# 2 sets 10    Cybex Knee Flexion 15# 15x then 10 x      Knee/Hip Exercises: Standing    Hip Flexion Stengthening;Both;10 reps;Knee bent;Knee straight   3# with walker   Hip Abduction Stengthening;Both;10 reps   3# with walker   Hip Extension Stengthening;Both;10 reps   3# with RW     Knee/Hip Exercises: Seated   Other Seated Knee/Hip Exercises blue tband trunk flex and ext 15 x    Sit to Sand 2 sets;5 reps;without UE support   with correct cuing     Shoulder Exercises: Seated   Extension Strengthening;Both;20 reps    Theraband Level (Shoulder Extension) Level 2 (Red)    Row Strengthening;Both;20 reps   red tband   External Rotation Strengthening;Both;20 reps    Theraband Level (Shoulder External Rotation) Level 2 (Red)                       PT Short Term Goals - 07/09/21 1411       PT SHORT TERM GOAL #1   Title independent with initial HEP    Time 4  Period Weeks    Status New    Target Date 08/06/21               PT Long Term Goals - 07/12/21 1145       PT LONG TERM GOAL #5   Baseline 34/56 07/12/21    Time 12    Period Weeks    Status New    Target Date 10/01/21                   Plan - 07/16/21 1302     Clinical Impression Statement progressed ther ex UE/LE and core with cuing and SBA- pt tolerated initial ther ex progression well and states doing HEP    PT Treatment/Interventions ADLs/Self Care Home Management;DME Instruction;Balance training;Therapeutic exercise;Therapeutic activities;Functional mobility training;Stair training;Gait training;Patient/family education;Manual techniques;Dry needling;Passive range of motion    PT Next Visit Plan Continue trunk and LE strengthening. update HEP as needed             Patient will benefit from skilled therapeutic intervention in order to improve the following deficits and impairments:  Abnormal gait, Decreased range of motion, Difficulty walking, Pain, Decreased endurance, Decreased activity tolerance, Impaired flexibility, Improper body mechanics, Postural dysfunction,  Decreased strength, Decreased mobility, Decreased balance  Visit Diagnosis: Muscle weakness (generalized)  Unsteadiness on feet  Difficulty in walking, not elsewhere classified     Problem List Patient Active Problem List   Diagnosis Date Noted   Breast mass, left 07/10/2021   Fever    Bacterial meningitis    Right sided weakness    Pressure injury of skin 06/13/2021   Sepsis due to Haemophilus influenzae with acute hypoxic respiratory failure and septic shock ([place])    Acute bacterial meningitis 12/45/8099   Acute metabolic encephalopathy 83/38/2505   Altered mental status 06/12/2021   Obesity, morbid, BMI 40.0-49.9 ([place]) 03/24/2016   Family history of breast cancer in sister 10/21/2013   Tick bite of back 10/21/2012   Screening for malignant neoplasm of the cervix 05/19/2012   Routine gynecological examination 05/19/2012   Cough 03/05/2012   Tobacco use 12/30/2011   Diabetes mellitus, type 2 ([place]) 12/29/2011   HTN (hypertension) 12/29/2011   Hyperlipidemia 12/29/2011   CSF leak 12/29/2011   Psoriasis 12/29/2011   PCOS (polycystic ovarian syndrome) 12/29/2011   Depression with anxiety 12/29/2011    Caysie Minnifield,ANGIE, PTA 07/16/2021, 1:04 PM  [place]. [place], [place], [address] Phone: [phone]   Fax:  [phone]  Name: Lisa [name] MRN: [id number] Date of Birth: 08/17/1960

## 2021-07-18 ENCOUNTER — Ambulatory Visit: Payer: Commercial Managed Care - HMO | Admitting: Physical Therapy

## 2021-07-22 NOTE — Addendum Note (Signed)
Addended by: Kathrine Cords on: 07/22/2021 05:14 PM   Modules accepted: Orders

## 2021-07-24 ENCOUNTER — Other Ambulatory Visit (HOSPITAL_BASED_OUTPATIENT_CLINIC_OR_DEPARTMENT_OTHER): Payer: Self-pay

## 2021-07-24 ENCOUNTER — Other Ambulatory Visit: Payer: Self-pay

## 2021-07-24 ENCOUNTER — Ambulatory Visit: Payer: Commercial Managed Care - HMO | Admitting: Physical Therapy

## 2021-07-24 ENCOUNTER — Encounter: Payer: Self-pay | Admitting: Physical Therapy

## 2021-07-24 DIAGNOSIS — M6281 Muscle weakness (generalized): Secondary | ICD-10-CM

## 2021-07-24 DIAGNOSIS — R262 Difficulty in walking, not elsewhere classified: Secondary | ICD-10-CM

## 2021-07-24 DIAGNOSIS — R2681 Unsteadiness on feet: Secondary | ICD-10-CM

## 2021-07-24 NOTE — Therapy (Signed)
[place]. [place], [place], [address] Phone: [phone]   Fax:  [phone]  Physical Therapy Treatment  Patient Details  Name: Lisa [name] MRN: [id number] Date of Birth: [birth year] Referring Provider (PT): Marlowe Shores   Encounter Date: 07/24/2021   PT End of Session - 07/24/21 1656     Visit Number 4    Number of Visits 24    Date for PT Re-Evaluation 10/01/21    PT Start Time 1631    PT Stop Time 1711    PT Time Calculation (min) 40 min    Activity Tolerance Patient tolerated treatment well    Behavior During Therapy [facility] for tasks assessed/performed             Past Medical History:  Diagnosis Date   Anxiety    Arthritis    Breast mass, left 07/10/2021   Bulging disc L-5   Chicken pox    Depression    Diabetes mellitus    Hyperlipidemia    Hypertension    Insomnia    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   Subjective Assessment - 07/24/21 1636     Subjective Not a good day. R shoulder pain and R knee pain have increased. She also reports that she may have to drop to 1 tx/week due to finances and transportation.    Pertinent History DDD, L4-5 disc bulge, B knee arthritis, R > L and a R shoulder rotator cuff injury    Currently in Pain? Yes    Pain Score 7     Pain Location Shoulder    Pain Orientation Right    Pain Descriptors /  Indicators Aching    Pain Type Acute pain    Pain Onset More than a month ago    Pain Frequency Intermittent    Aggravating Factors  Lifting    Multiple Pain Sites No                               [facility] Adult PT Treatment/Exercise - 07/24/21 0001       Knee/Hip Exercises: Aerobic   Nustep L3 x 6 minutes.      Knee/Hip Exercises: Standing   Knee Flexion Strengthening;Both;2 sets;10 reps    Knee Flexion Limitations 3#    Hip Abduction Stengthening;Both;15 reps    Abduction Limitations 3    Hip Extension Stengthening;Both;15 reps      Knee/Hip Exercises: Seated   Long Arc Quad Strengthening;Both;2 sets;10 reps;Weights    Long Arc Quad Weight 3 lbs.      Shoulder Exercises: Standing   External Rotation Strengthening;Both;20 reps;Theraband    Theraband Level (Shoulder External Rotation) Level 2 (Red)    Extension Strengthening;Both;20 reps;Theraband    Theraband Level (Shoulder Extension) Level 2 (  Red)    Row Strengthening;Both;20 reps;Theraband    Theraband Level (Shoulder Row) Level 2 (Red)                     PT Education - 07/24/21 1655     Education Details pateint encouraged to perform half her exercise program every day, alternating the exercises each day.    Person(s) Educated Patient    Methods Explanation    Comprehension Verbalized understanding              PT Short Term Goals - 07/24/21 1700       PT SHORT TERM GOAL #1   Title independent with initial HEP    Baseline Patient reports she is inconsistently performing.    Time 4    Period Weeks    Status On-going    Target Date 08/06/21               PT Long Term Goals - 07/12/21 1145       PT LONG TERM GOAL #5   Baseline 34/56 07/12/21    Time 12    Period Weeks    Status New    Target Date 10/01/21                   Plan - 07/24/21 1648     Clinical Impression Statement Patient reported increased pain in R shoulder. Treatment focused on  strengthening in non-painful activities, alternating upper and lower body to increase tolerance to activity.    PT Treatment/Interventions ADLs/Self Care Home Management;DME Instruction;Balance training;Therapeutic exercise;Therapeutic activities;Functional mobility training;Stair training;Gait training;Patient/family education;Manual techniques;Dry needling;Passive range of motion    PT Next Visit Plan Continue trunk and LE strengthening. update HEP as needed    PT Home Exercise Plan [email or web address]             Patient will benefit from skilled therapeutic intervention in order to improve the following deficits and impairments:  Abnormal gait, Decreased range of motion, Difficulty walking, Pain, Decreased endurance, Decreased activity tolerance, Impaired flexibility, Improper body mechanics, Postural dysfunction, Decreased strength, Decreased mobility, Decreased balance  Visit Diagnosis: Muscle weakness (generalized)  Unsteadiness on feet  Difficulty in walking, not elsewhere classified     Problem List Patient Active Problem List   Diagnosis Date Noted   Breast mass, left 07/10/2021   Fever    Bacterial meningitis    Right sided weakness    Pressure injury of skin 06/13/2021   Sepsis due to Haemophilus influenzae with acute hypoxic respiratory failure and septic shock ([place])    Acute bacterial meningitis 89/21/1941   Acute metabolic encephalopathy 74/01/1447   Altered mental status 06/12/2021   Obesity, morbid, BMI 40.0-49.9 ([place]) 03/24/2016   Family history of breast cancer in sister 10/21/2013   Tick bite of back 10/21/2012   Screening for malignant neoplasm of the cervix 05/19/2012   Routine gynecological examination 05/19/2012   Cough 03/05/2012   Tobacco use 12/30/2011   Diabetes mellitus, type 2 ([place]) 12/29/2011   HTN (hypertension) 12/29/2011   Hyperlipidemia 12/29/2011   CSF leak 12/29/2011   Psoriasis 12/29/2011   PCOS (polycystic ovarian syndrome) 12/29/2011    Depression with anxiety 12/29/2011    Marcelina Morel, DPT 07/24/2021, 5:12 PM  [place]. [place], [place], [address] Phone: [phone]   Fax:  [phone]  Name: DULSE RUTAN MRN: [id number] Date of Birth: 1961-04-14

## 2021-07-26 ENCOUNTER — Ambulatory Visit: Payer: Commercial Managed Care - HMO | Admitting: Physical Therapy

## 2021-07-27 ENCOUNTER — Other Ambulatory Visit: Payer: Self-pay | Admitting: Infectious Disease

## 2021-07-27 ENCOUNTER — Ambulatory Visit
Admission: RE | Admit: 2021-07-27 | Discharge: 2021-07-27 | Disposition: A | Discharge status: Home / Self Care | Payer: Managed Care, Other (non HMO) | Source: Ambulatory Visit | Attending: Infectious Disease | Admitting: Infectious Disease

## 2021-07-27 ENCOUNTER — Ambulatory Visit
Admission: RE | Admit: 2021-07-27 | Discharge: 2021-07-27 | Disposition: A | Discharge status: Home / Self Care | Payer: 59 | Source: Ambulatory Visit | Attending: Infectious Disease | Admitting: Infectious Disease

## 2021-07-27 DIAGNOSIS — N6342 Unspecified lump in left breast, subareolar: Secondary | ICD-10-CM

## 2021-07-29 ENCOUNTER — Telehealth: Payer: Self-pay

## 2021-07-29 ENCOUNTER — Ambulatory Visit: Payer: Commercial Managed Care - HMO | Admitting: Physical Therapy

## 2021-07-29 ENCOUNTER — Other Ambulatory Visit: Payer: Self-pay

## 2021-07-29 ENCOUNTER — Encounter: Payer: Self-pay | Admitting: Physical Therapy

## 2021-07-29 DIAGNOSIS — R2681 Unsteadiness on feet: Secondary | ICD-10-CM

## 2021-07-29 DIAGNOSIS — M6281 Muscle weakness (generalized): Secondary | ICD-10-CM

## 2021-07-29 DIAGNOSIS — R262 Difficulty in walking, not elsewhere classified: Secondary | ICD-10-CM

## 2021-07-29 NOTE — Patient Instructions (Signed)
Access Code: [email or web address] URL: [email or web address] Date: 07/29/2021 Prepared by: Ethel Rana  Exercises Shoulder Extension with Resistance - 1 x daily - 7 x weekly - 2 sets - 10 reps Standing Bilateral Low Shoulder Row with Anchored Resistance - 1 x daily - 7 x weekly - 2 sets - 10 reps Shoulder External Rotation and Scapular Retraction with Resistance - 1 x daily - 7 x weekly - 2 sets - 10 reps Hooklying Clamshell with Resistance - 1 x daily - 7 x weekly - 2 sets - 10 reps Supine Bridge - 1 x daily - 7 x weekly - 2 sets - 10 reps Supine Straight Leg Raises - 1 x daily - 7 x weekly - 2 sets - 10 reps Straight Leg Raise with External Rotation - 1 x daily - 7 x weekly - 2 sets - 10 reps Sit to Stand - 1 x daily - 7 x weekly - 3 sets - 10 reps Sidestepping - 1 x daily - 7 x weekly - 3 sets - 10 reps

## 2021-07-29 NOTE — Telephone Encounter (Signed)
I spoke to [place] with patient's pcp office and relayed mammogram results. Patient is now being seen with [facility] and no longer seeing Dr. Esmeralda Links. I have faxed the mammogram and breast US results to their office to follow up with the patient as well.  [place] Loyde Orth, CMA

## 2021-07-29 NOTE — Therapy (Signed)
[place] [place], [place], [address] Phone: [phone]   Fax:  [phone]  Physical Therapy Treatment PHYSICAL THERAPY DISCHARGE SUMMARY  Visits from Start of Care: 5  Current functional level related to goals / functional outcomes: See objective data in note.   Remaining deficits: Weakness, pain, stiffness, decreased balance.   Education / Equipment: HEP   Patient agrees to discharge. Patient goals were partially met. Patient is being discharged due to financial reasons.  Patient Details  Name: Lisa [name] MRN: [id number] Date of Birth: 30-Apr-1961 Referring Provider (PT): Marlowe Shores   Encounter Date: 07/29/2021   PT End of Session - 07/29/21 1342     Visit Number 5    Number of Visits 24    Date for PT Re-Evaluation 10/01/21    PT Start Time 1316    PT Stop Time 1350    PT Time Calculation (min) 34 min    Activity Tolerance Patient tolerated treatment well    Behavior During Therapy [facility] for tasks assessed/performed             Past Medical History:  Diagnosis Date   Anxiety    Arthritis    Breast mass, left 07/10/2021   Bulging disc L-5   Chicken pox    Depression    Diabetes mellitus    Hyperlipidemia    Hypertension    Insomnia    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   Subjective Assessment - 07/29/21 1343      Subjective Patient reports that she is 95% sure she has breast cancer. Scheduled for a biopsy. However, she will need to return to her therapist for her mental health. She cannot afford the co pay for both therapies. Therefore, today with be her last day of PT.    Currently in Pain? No/denies                               [facility] Adult PT Treatment/Exercise - 07/29/21 0001       Knee/Hip Exercises: Aerobic   Nustep L4 x 5 minutes      Knee/Hip Exercises: Standing   Other Standing Knee Exercises side to side stepping x 10 reps in each direction.      Knee/Hip Exercises: Seated   Sit to Sand 1 set;10 reps                     PT Education - 07/29/21 1341     Education Details Updated HEP.    Person(s) Educated Patient    Methods Explanation;Demonstration;Handout    Comprehension Returned demonstration;Verbalized understanding              PT Short Term Goals - 07/29/21 1337  PT SHORT TERM GOAL #1   Status Achieved               PT Long Term Goals - 07/29/21 1337       PT LONG TERM GOAL #2   Title Increase BLE strength to 4/5 at all joints and all planes.    Baseline 3+-(4-)/5    Period Weeks    Status Not Met      PT LONG TERM GOAL #3   Title Decrease STS time to < 15 seconds to demosntrate decresaed fall risk and improved functional strength.    Baseline 26.09    Status Partially Met      PT LONG TERM GOAL #4   Title Ambulate at least 500' during 6 minute walk test without stopping and with report of only mild fatigue afterwards    Baseline unable to perform due to increased knee pain.    Status Not Met      PT LONG TERM GOAL #5   Title Patient will score at least 48/56 on BERG to demonstrate low fall risk.    Baseline Unable to re-assess due to unexpected D/C.    Status Unable to assess                   Plan - 07/29/21 1342     Clinical Impression Statement Pateint arrives today, reporting that she  most likely has breast cancer. She is getting a biopsy soon to confirm. She needs to return to her mental health specialist and cannot afford the co-pays for both that and PT, so today is her last day. Treatment was spent updating HEP and performing some re-assessments, as tolerated.    Personal Factors and Comorbidities Fitness;Past/Current Experience;Comorbidity 1    Examination-Activity Limitations Bathing;Locomotion Level;Transfers;Bed Mobility;Bend;Carry;Squat;Stairs;Hygiene/Grooming;Stand;Lift    Examination-Participation Restrictions Meal Prep;Cleaning;Community Activity;Laundry    Stability/Clinical Decision Making Evolving/Moderate complexity    Clinical Decision Making Moderate    PT Treatment/Interventions ADLs/Self Care Home Management;DME Instruction;Balance training;Therapeutic exercise;Therapeutic activities;Functional mobility training;Stair training;Gait training;Patient/family education;Manual techniques;Dry needling;Passive range of motion    PT Next Visit Plan Continue trunk and LE strengthening. update HEP as needed    PT Home Exercise Plan [id number]    Consulted and Agree with Plan of Care Patient             Patient will benefit from skilled therapeutic intervention in order to improve the following deficits and impairments:  Abnormal gait, Decreased range of motion, Difficulty walking, Pain, Decreased endurance, Decreased activity tolerance, Impaired flexibility, Improper body mechanics, Postural dysfunction, Decreased strength, Decreased mobility, Decreased balance  Visit Diagnosis: Muscle weakness (generalized)  Unsteadiness on feet  Difficulty in walking, not elsewhere classified     Problem List Patient Active Problem List   Diagnosis Date Noted   Breast mass, left 07/10/2021   Fever    Bacterial meningitis    Right sided weakness    Pressure injury of skin 06/13/2021   Sepsis due to Haemophilus influenzae with acute hypoxic respiratory failure and  septic shock ([place])    Acute bacterial meningitis 06/01/8249   Acute metabolic encephalopathy 03/70/4888   Altered mental status 06/12/2021   Obesity, morbid, BMI 40.0-49.9 ([place]) 03/24/2016   Family history of breast cancer in sister 10/21/2013   Tick bite of back 10/21/2012   Screening for malignant neoplasm of the cervix 05/19/2012   Routine gynecological examination 05/19/2012   Cough 03/05/2012   Tobacco use 12/30/2011   Diabetes mellitus, type 2 ([place]) 12/29/2011  HTN (hypertension) 12/29/2011   Hyperlipidemia 12/29/2011   CSF leak 12/29/2011   Psoriasis 12/29/2011   PCOS (polycystic ovarian syndrome) 12/29/2011   Depression with anxiety 12/29/2011    Marcelina Morel, DPT 07/29/2021, 1:56 PM  [place]. [place], [place], [address] Phone: [phone]   Fax:  [phone]  Name: Lisa [name] MRN: [id number] Date of Birth: April 06, 1961

## 2021-07-29 NOTE — Telephone Encounter (Signed)
-----   Message from Truman Hayward, MD sent at 07/28/2021  7:11 PM EST ----- Pt has what ishighly suspicious for breast cancer I ordered as favor to pt before shecouldget to pcp[ ----- Message ----- From: Interface, Rad Results In Sent: 07/27/2021   2:16 PM EST To: Truman Hayward, MD

## 2021-07-31 ENCOUNTER — Ambulatory Visit: Payer: Commercial Managed Care - HMO | Admitting: Physical Therapy

## 2021-08-05 ENCOUNTER — Ambulatory Visit: Payer: Commercial Managed Care - HMO | Admitting: Physical Therapy

## 2021-08-06 ENCOUNTER — Other Ambulatory Visit (HOSPITAL_BASED_OUTPATIENT_CLINIC_OR_DEPARTMENT_OTHER): Payer: Self-pay

## 2021-08-06 ENCOUNTER — Ambulatory Visit
Admission: RE | Admit: 2021-08-06 | Discharge: 2021-08-06 | Disposition: A | Discharge status: Home / Self Care | Payer: Managed Care, Other (non HMO) | Source: Ambulatory Visit | Attending: Infectious Disease | Admitting: Infectious Disease

## 2021-08-06 DIAGNOSIS — N6342 Unspecified lump in left breast, subareolar: Secondary | ICD-10-CM

## 2021-08-06 MED ORDER — EZETIMIBE 10 MG PO TABS
ORAL_TABLET | ORAL | 0 refills | Status: DC
  Filled 2021-08-06: qty 30, 30d supply, fill #0
  Filled 2021-10-04 (×3): qty 30, 30d supply, fill #1

## 2021-08-06 MED ORDER — JARDIANCE 10 MG PO TABS
ORAL_TABLET | ORAL | 0 refills | Status: DC
  Filled 2021-08-06: qty 30, 30d supply, fill #0

## 2021-08-06 MED ORDER — DICLOFENAC SODIUM 75 MG PO TBEC
DELAYED_RELEASE_TABLET | ORAL | 0 refills | Status: DC
  Filled 2021-08-06: qty 60, 30d supply, fill #0

## 2021-08-07 ENCOUNTER — Other Ambulatory Visit (HOSPITAL_BASED_OUTPATIENT_CLINIC_OR_DEPARTMENT_OTHER): Payer: Self-pay

## 2021-08-07 ENCOUNTER — Ambulatory Visit: Payer: Managed Care, Other (non HMO) | Admitting: Physical Therapy

## 2021-08-07 HISTORY — PX: BREAST LUMPECTOMY: SHX2

## 2021-08-08 ENCOUNTER — Telehealth: Payer: Self-pay | Admitting: Hematology

## 2021-08-08 NOTE — Telephone Encounter (Signed)
Left message for patient to return call to confirm upcoming clinic appointment for 3/8 @ 815 ?

## 2021-08-08 NOTE — Telephone Encounter (Signed)
Patient returned called and confirmed morning appointment for 3/8, packet will be mailed to patient ?

## 2021-08-09 ENCOUNTER — Encounter: Payer: Self-pay | Admitting: *Deleted

## 2021-08-12 ENCOUNTER — Other Ambulatory Visit: Payer: Self-pay | Admitting: *Deleted

## 2021-08-12 ENCOUNTER — Other Ambulatory Visit (HOSPITAL_BASED_OUTPATIENT_CLINIC_OR_DEPARTMENT_OTHER): Payer: Self-pay

## 2021-08-12 ENCOUNTER — Other Ambulatory Visit: Payer: Self-pay | Admitting: General Surgery

## 2021-08-12 DIAGNOSIS — Z170 Estrogen receptor positive status [ER+]: Secondary | ICD-10-CM

## 2021-08-12 DIAGNOSIS — C50412 Malignant neoplasm of upper-outer quadrant of left female breast: Secondary | ICD-10-CM

## 2021-08-13 ENCOUNTER — Other Ambulatory Visit (HOSPITAL_BASED_OUTPATIENT_CLINIC_OR_DEPARTMENT_OTHER): Payer: Self-pay

## 2021-08-13 NOTE — Progress Notes (Signed)
Radiation Oncology         [phone] ________________________________  Initial Outpatient Consultation  Name: Lisa [name] MRN: [id number]  Date: 08/14/2021  DOB: Dec 09, 1960  JW:[id number], Lollie Marrow, FNP  Stark Klein, MD   REFERRING PHYSICIAN: Stark Klein, MD  DIAGNOSIS:    ICD-10-CM   1. Malignant neoplasm of upper-outer quadrant of left breast in female, estrogen receptor positive ([place])  C50.412    Z17.0       Cancer Staging  Malignant neoplasm of upper-outer quadrant of left breast in female, estrogen receptor positive ([place]) Staging form: Breast, AJCC 8th Edition - Clinical stage from 08/06/2021: Stage IA (cT1c, cN0, cM0, G2, ER+, PR+, HER2-) - Signed by Truitt Merle, MD on 08/13/2021 Stage prefix: Initial diagnosis Histologic grading system: 3 grade system   Stage IA (cT1c, cN0, cM0) Left Breast UOQ, Invasive Mammary Carcinoma with DCIS, ER+ / PR+ / Her2-, Grade 2  CHIEF COMPLAINT: Here to discuss management of left breast cancer  HISTORY OF PRESENT ILLNESS::Lisa [name] is a 61 y.o. female who presented a palpable lump in the left breast. Subsequent diagnostic left breast mammogram and ultrasound on 07/28/21 revealed a highly suspicious irregular mass in the left breast, 3 o'clock retroareolar region, measuring 13 x 14 x 11 mm, correlating with the palpable site of concern. No axillary adenopathy was appreciated on [place].     Biopsy of the 3 o'clock retroareolar left breast mass on date of 08/06/21 showed grade 2 invasive mammary carcinoma measuring 1.5 cm in the greatest linear extent, with ductal carcinoma in-situ.  ER status: 100% positive with strong-moderate staining intensity; PR status 100% positive with strong staining intensity; Proliferation marker Ki67 at 5%; Her2 status negative; Grade 2. No lymph nodes were examined.   She is with her husband today. She uses a walker. 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 She stopped smoking during the ICU stay and since then. 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 Not currently working.  PREVIOUS RADIATION THERAPY: No  PAST MEDICAL HISTORY:  has a past medical history of Anxiety, Arthritis, Breast cancer ([place]) (06/28/21), Breast mass, left (07/10/2021), Bulging disc (L-5), Chicken pox, Depression, Diabetes mellitus, Hyperlipidemia, Hypertension, Insomnia, Major bone defects, Meningitis (06/11/2021), and Polycystic ovarian disease.    PAST SURGICAL HISTORY: Past Surgical History:  Procedure Laterality Date   csf leak in skull  2005   noted failed attempt to close CSF leak in skull   DILATION AND EVACUATION  1990   miscarriage   IR FLUORO GUIDED NEEDLE PLC ASPIRATION/INJECTION LOC  06/12/2021   RADIOLOGY WITH ANESTHESIA N/A 06/12/2021   Procedure: IR WITH ANESTHESIA;  Surgeon: Radiologist, Medication, MD;  Location: [place];  Service: Radiology;  Laterality: N/A;   TONSILLECTOMY AND ADENOIDECTOMY  1973    FAMILY HISTORY: family history includes Arthritis in her father, mother, and sister; Bladder Cancer in her maternal uncle; Breast cancer in her maternal grandmother, paternal aunt, paternal grandmother, sister, and sister; Diabetes in her maternal aunt, maternal uncle, paternal grandmother, and sister; Early death in her sister; Heart disease in her father, maternal grandmother, paternal grandfather, and paternal grandmother; Hyperlipidemia in her sister; Hypertension in her maternal grandmother and mother; Marfan syndrome in her father and sister; Obesity in her mother; Prostate cancer in her father; Stroke in her paternal grandmother and sister.  SOCIAL HISTORY:  reports that she quit  smoking about 2 months ago. Her smoking use included cigarettes. She has a 66.00 pack-year smoking history. She has never used smokeless tobacco. She reports that she does not drink alcohol and does not use  drugs.  ALLERGIES: Statins and Glimepiride  MEDICATIONS:  Current Outpatient Medications  Medication Sig Dispense Refill   acetaminophen (TYLENOL) 325 MG tablet Take 2 tablets (650 mg total) by mouth every 6 (six) hours as needed for fever (temp > 101).     albuterol (VENTOLIN HFA) 108 (90 Base) MCG/ACT inhaler Inhale 2 puffs into the lungs every 6 (six) hours as needed for wheezing. 8.5 g 0   ALPRAZolam (XANAX) 0.25 MG tablet Take 1 tablet (0.25 mg total) by mouth 2 (two) times daily as needed for anxiety. 20 tablet 0   aspirin EC 81 MG tablet Take 81 mg by mouth daily. Swallow whole.     buPROPion (WELLBUTRIN XL) 300 MG 24 hr tablet Take 1 tablet (300 mg total) by mouth daily. 30 tablet 0   diclofenac (VOLTAREN) 75 MG EC tablet Take 1 tablet by mouth twice a day as needed 180 tablet 0   diclofenac Sodium (VOLTAREN) 1 % GEL Apply 4 g topically every 6 (six) hours as needed (Shoulder pain). 100 g 0   Eszopiclone 3 MG TABS TAKE 1 TABLET BY MOUTH ONCE DAILY IMMEDIATELY BEFORE BEDTIME 30 tablet 0   ezetimibe (ZETIA) 10 MG tablet Take 1 tablet by mouth once a day 60 tablet 0   insulin glargine (LANTUS) 100 UNIT/ML Solostar Pen Inject 5 Units into the skin at bedtime. 15 mL 11   liraglutide (VICTOZA) 18 MG/3ML SOPN Inject 0.6 mg into the skin daily.     lisinopril (ZESTRIL) 5 MG tablet Take 1 tablet (5 mg total) by mouth daily. 30 tablet 0   sertraline (ZOLOFT) 100 MG tablet Take 1 tablet (100 mg total) by mouth daily. (Patient taking differently: Take 200 mg by mouth daily.) 30 tablet 0   spironolactone (ALDACTONE) 25 MG tablet TAKE 1 TABLET BY MOUTH ONCE DAILY (Patient taking differently: Take 25 mg by mouth daily.) 90 tablet 2   No current facility-administered medications for this encounter.    REVIEW OF SYSTEMS: As above in HPI.   PHYSICAL EXAM:  vitals were not taken for this visit.   General: Alert and oriented, in no acute distress HEENT: Head is normocephalic. Extraocular movements  are intact. Oropharynx is clear. Neck: Neck is supple, no palpable cervical or supraclavicular lymphadenopathy. Heart: Regular in rate and rhythm with no murmurs, rubs, or gallops. Chest: Clear to auscultation bilaterally, with no rhonchi, wheezes, or rales. Abdomen: Soft, nontender, nondistended, with no rigidity or guarding. Extremities: No cyanosis or edema. Lymphatics: see Neck Exam Skin: No concerning lesions. Musculoskeletal: symmetric strength and muscle tone throughout. Neurologic: Cranial nerves II through XII are grossly intact. No obvious focalities. Speech is fluent. Coordination is intact. Psychiatric: Judgment and insight are intact. Affect is appropriate. Breasts: 1cm mass in the LOQ left breast . No other palpable masses appreciated in the breasts or axillae b/l .   ECOG = 2  0 - Asymptomatic (Fully active, able to carry on all predisease activities without restriction)  1 - Symptomatic but completely ambulatory (Restricted in physically strenuous activity but ambulatory and able to carry out work of a light or sedentary nature. For example, light housework, office work)  2 - Symptomatic, <50% in bed during the day (Ambulatory and capable of all self care but unable to carry out  any work activities. Up and about more than 50% of waking hours)  3 - Symptomatic, >50% in bed, but not bedbound (Capable of only limited self-care, confined to bed or chair 50% or more of waking hours)  4 - Bedbound (Completely disabled. Cannot carry on any self-care. Totally confined to bed or chair)  5 - Death   Eustace Pen MM, Creech RH, Tormey DC, et al. [phone]). "Toxicity and response criteria of the [facility]". [place] Oncol. 5 (6): 649-55   LABORATORY DATA:  Lab Results  Component Value Date   WBC 7.2 08/14/2021   HGB 13.3 08/14/2021   HCT 38.7 08/14/2021   MCV 89.2 08/14/2021   PLT 325 08/14/2021   CMP     Component Value Date/Time   NA 135 08/14/2021  0811   K 3.9 08/14/2021 0811   CL 102 08/14/2021 0811   CO2 25 08/14/2021 0811   GLUCOSE 222 (H) 08/14/2021 0811   BUN 14 08/14/2021 0811   CREATININE 0.77 08/14/2021 0811   CALCIUM 9.4 08/14/2021 0811   PROT 7.0 08/14/2021 0811   ALBUMIN 4.1 08/14/2021 0811   AST 12 (L) 08/14/2021 0811   ALT 14 08/14/2021 0811   ALKPHOS 62 08/14/2021 0811   BILITOT 0.2 (L) 08/14/2021 0811   GFRNONAA >60 08/14/2021 0811         RADIOGRAPHY: US BREAST LTD UNI LEFT INC AXILLA  Result Date: 07/27/2021 CLINICAL DATA:  Palpable lump in the left breast. EXAM: DIGITAL DIAGNOSTIC BILATERAL MAMMOGRAM WITH TOMOSYNTHESIS AND CAD; ULTRASOUND LEFT BREAST LIMITED TECHNIQUE: Bilateral digital diagnostic mammography and breast tomosynthesis was performed. The images were evaluated with computer-aided detection.; Targeted ultrasound examination of the left breast was performed. COMPARISON:  Previous exam(s). ACR Breast Density Category b: There are scattered areas of fibroglandular density. FINDINGS: There is a spiculated mass in the left breast at 3 o'clock accounting accounting for the patient's lump. No other suspicious findings in either breast. Targeted ultrasound is performed, showing an irregular mass in the left breast at 3 o'clock in the retroareolar region measuring 13 x 14 x 11 mm. No axillary adenopathy. IMPRESSION: Highly suspicious left breast mass. No left axillary adenopathy. No other abnormalities. RECOMMENDATION: Recommend ultrasound-guided biopsy of the left breast mass. I have discussed the findings and recommendations with the patient. If applicable, a reminder letter will be sent to the patient regarding the next appointment. BI-RADS CATEGORY  5: Highly suggestive of malignancy. Electronically Signed   By: Dorise Bullion III M.D.   On: 07/27/2021 12:16  MM DIAG BREAST TOMO BILATERAL  Result Date: 07/27/2021 CLINICAL DATA:  Palpable lump in the left breast. EXAM: DIGITAL DIAGNOSTIC BILATERAL MAMMOGRAM  WITH TOMOSYNTHESIS AND CAD; ULTRASOUND LEFT BREAST LIMITED TECHNIQUE: Bilateral digital diagnostic mammography and breast tomosynthesis was performed. The images were evaluated with computer-aided detection.; Targeted ultrasound examination of the left breast was performed. COMPARISON:  Previous exam(s). ACR Breast Density Category b: There are scattered areas of fibroglandular density. FINDINGS: There is a spiculated mass in the left breast at 3 o'clock accounting accounting for the patient's lump. No other suspicious findings in either breast. Targeted ultrasound is performed, showing an irregular mass in the left breast at 3 o'clock in the retroareolar region measuring 13 x 14 x 11 mm. No axillary adenopathy. IMPRESSION: Highly suspicious left breast mass. No left axillary adenopathy. No other abnormalities. RECOMMENDATION: Recommend ultrasound-guided biopsy of the left breast mass. I have discussed the findings and recommendations with the patient. If applicable,  a reminder letter will be sent to the patient regarding the next appointment. BI-RADS CATEGORY  5: Highly suggestive of malignancy. Electronically Signed   By: Dorise Bullion III M.D.   On: 07/27/2021 12:16  MM CLIP PLACEMENT LEFT  Result Date: 08/06/2021 CLINICAL DATA:  Post biopsy mammogram of the left breast for clip placement. EXAM: 3D DIAGNOSTIC LEFT MAMMOGRAM POST ULTRASOUND BIOPSY COMPARISON:  Previous exam(s). FINDINGS: 3D Mammographic images were obtained following ultrasound guided biopsy of a mass in the lateral retroareolar left breast. The biopsy marking clip is in expected position at the site of biopsy. IMPRESSION: Appropriate positioning of the ribbon shaped biopsy marking clip at the site of biopsy in the lateral retroareolar left breast. Final Assessment: Post Procedure Mammograms for Marker Placement Electronically Signed   By: Ammie Ferrier M.D.   On: 08/06/2021 13:32  [place] LT BREAST BX W LOC DEV 1ST LESION IMG BX SPEC US  GUIDE  Addendum Date: 08/08/2021   ADDENDUM REPORT: 08/08/2021 08:18 ADDENDUM: Pathology revealed GRADE II INVASIVE MAMMARY CARCINOMA, DUCTAL CARCINOMA IN SITU of the LEFT breast, retroareolar, 3:00 o'clock, (ribbon clip). This was found to be concordant by Dr. Ammie Ferrier. Pathology results were discussed with the patient by telephone. The patient reported doing well after the biopsy with minimal tenderness at the site. Post biopsy instructions and care were reviewed and questions were answered. The patient was encouraged to call [facility] for any additional concerns. My direct phone number was provided. The patient was referred to [facility] on August 14, 2021. Pathology results reported by Terie Purser, RN on 08/07/2021. Electronically Signed   By: Ammie Ferrier M.D.   On: 08/08/2021 08:18   Result Date: 08/08/2021 CLINICAL DATA:  61 year old female presenting for ultrasound-guided biopsy of a left breast mass. She has strong family history of breast cancer. EXAM: ULTRASOUND GUIDED LEFT BREAST CORE NEEDLE BIOPSY COMPARISON:  Previous exam(s). PROCEDURE: I met with the patient and we discussed the procedure of ultrasound-guided biopsy, including benefits and alternatives. We discussed the high likelihood of a successful procedure. We discussed the risks of the procedure, including infection, bleeding, tissue injury, clip migration, and inadequate sampling. Informed written consent was given. The usual time-out protocol was performed immediately prior to the procedure. Lesion quadrant: Lower outer quadrant Using sterile technique and 1% Lidocaine as local anesthetic, under direct ultrasound visualization, a 14 gauge spring-loaded device was used to perform biopsy of a mass in the retroareolar left breast at 3 o'clock using an inferior approach. At the conclusion of the procedure ribbon tissue marker  clip was deployed into the biopsy cavity. Follow up 2 view mammogram was performed and dictated separately. IMPRESSION: Ultrasound guided biopsy of a mass in the retroareolar left breast at 3 o'clock. No apparent complications. Electronically Signed: By: Ammie Ferrier M.D. On: 08/06/2021 13:26     IMPRESSION/PLAN: This is a very pleasant 61 yo woman with stage I breast cancer  She has been discussed at our multidisciplinary tumor board.  The consensus is that she would be a good candidate for breast conservation. I talked to her about the option of a mastectomy and informed her that her expected overall survival would be equivalent between mastectomy and breast conservation, based upon randomized controlled data. She is enthusiastic about breast conservation.  It was a pleasure meeting the patient today. We discussed the risks, benefits, and side effects of radiotherapy. I recommend radiotherapy  to the left breast to reduce her risk of locoregional recurrence by 2/3.  We discussed that radiation would take approximately 4 weeks to complete and that I would give the patient a few weeks to heal following surgery before starting treatment planning.  If chemotherapy were to be given, this would precede radiotherapy. We spoke about acute effects including skin irritation and fatigue as well as much less common late effects including internal organ injury or irritation. We spoke about the latest technology that is used to minimize the risk of late effects for patients undergoing radiotherapy to the breast or chest wall. No guarantees of treatment were given. The patient is enthusiastic about proceeding with treatment. I look forward to participating in the patient's care.  I will await her referral back to me for postoperative follow-up and eventual CT simulation/treatment planning.  On date of service, in total, I spent 40 minutes on this encounter. Patient was seen in person.    __________________________________________   Eppie Gibson, MD  This document serves as a record of services personally performed by Eppie Gibson, MD. It was created on her behalf by Roney Mans, a trained medical scribe. The creation of this record is based on the scribe's personal observations and the provider's statements to them. This document has been checked and approved by the attending provider.

## 2021-08-14 ENCOUNTER — Encounter: Payer: Self-pay | Admitting: Hematology

## 2021-08-14 ENCOUNTER — Other Ambulatory Visit: Payer: Self-pay

## 2021-08-14 ENCOUNTER — Inpatient Hospital Stay (HOSPITAL_BASED_OUTPATIENT_CLINIC_OR_DEPARTMENT_OTHER): Payer: Managed Care, Other (non HMO) | Admitting: Hematology

## 2021-08-14 ENCOUNTER — Ambulatory Visit
Admission: RE | Admit: 2021-08-14 | Discharge: 2021-08-14 | Disposition: A | Discharge status: Home / Self Care | Payer: Managed Care, Other (non HMO) | Source: Ambulatory Visit | Attending: Radiation Oncology | Admitting: Radiation Oncology

## 2021-08-14 ENCOUNTER — Inpatient Hospital Stay: Payer: Managed Care, Other (non HMO) | Attending: Hematology

## 2021-08-14 ENCOUNTER — Encounter: Payer: Self-pay | Admitting: Genetic Counselor

## 2021-08-14 ENCOUNTER — Encounter: Payer: Self-pay | Admitting: Radiation Oncology

## 2021-08-14 ENCOUNTER — Ambulatory Visit (HOSPITAL_BASED_OUTPATIENT_CLINIC_OR_DEPARTMENT_OTHER): Payer: Managed Care, Other (non HMO) | Admitting: Genetic Counselor

## 2021-08-14 ENCOUNTER — Encounter: Payer: Self-pay | Admitting: *Deleted

## 2021-08-14 ENCOUNTER — Inpatient Hospital Stay: Payer: Managed Care, Other (non HMO) | Admitting: Emergency Medicine

## 2021-08-14 ENCOUNTER — Inpatient Hospital Stay: Payer: Managed Care, Other (non HMO) | Admitting: Licensed Clinical Social Worker

## 2021-08-14 VITALS — BP 135/78 | HR 93 | Temp 98.8°F | Resp 18 | Ht 66.0 in | Wt 254.6 lb

## 2021-08-14 DIAGNOSIS — Z794 Long term (current) use of insulin: Secondary | ICD-10-CM | POA: Insufficient documentation

## 2021-08-14 DIAGNOSIS — Z8042 Family history of malignant neoplasm of prostate: Secondary | ICD-10-CM | POA: Diagnosis not present

## 2021-08-14 DIAGNOSIS — Z803 Family history of malignant neoplasm of breast: Secondary | ICD-10-CM | POA: Diagnosis not present

## 2021-08-14 DIAGNOSIS — Z170 Estrogen receptor positive status [ER+]: Secondary | ICD-10-CM | POA: Diagnosis not present

## 2021-08-14 DIAGNOSIS — C50412 Malignant neoplasm of upper-outer quadrant of left female breast: Secondary | ICD-10-CM | POA: Diagnosis not present

## 2021-08-14 DIAGNOSIS — Z87891 Personal history of nicotine dependence: Secondary | ICD-10-CM

## 2021-08-14 DIAGNOSIS — M1990 Unspecified osteoarthritis, unspecified site: Secondary | ICD-10-CM | POA: Diagnosis not present

## 2021-08-14 DIAGNOSIS — I10 Essential (primary) hypertension: Secondary | ICD-10-CM

## 2021-08-14 DIAGNOSIS — F32A Depression, unspecified: Secondary | ICD-10-CM | POA: Insufficient documentation

## 2021-08-14 DIAGNOSIS — E119 Type 2 diabetes mellitus without complications: Secondary | ICD-10-CM

## 2021-08-14 DIAGNOSIS — E669 Obesity, unspecified: Secondary | ICD-10-CM

## 2021-08-14 DIAGNOSIS — Z8052 Family history of malignant neoplasm of bladder: Secondary | ICD-10-CM

## 2021-08-14 DIAGNOSIS — Z79899 Other long term (current) drug therapy: Secondary | ICD-10-CM | POA: Insufficient documentation

## 2021-08-14 LAB — CBC WITH DIFFERENTIAL (CANCER CENTER ONLY)
Abs Immature Granulocytes: 0.02 10*3/uL (ref 0.00–0.07)
Basophils Absolute: 0.1 10*3/uL (ref 0.0–0.1)
Basophils Relative: 1 %
Eosinophils Absolute: 0.5 10*3/uL (ref 0.0–0.5)
Eosinophils Relative: 7 %
HCT: 38.7 % (ref 36.0–46.0)
Hemoglobin: 13.3 g/dL (ref 12.0–15.0)
Immature Granulocytes: 0 %
Lymphocytes Relative: 27 %
Lymphs Abs: 1.9 10*3/uL (ref 0.7–4.0)
MCH: 30.6 pg (ref 26.0–34.0)
MCHC: 34.4 g/dL (ref 30.0–36.0)
MCV: 89.2 fL (ref 80.0–100.0)
Monocytes Absolute: 0.6 10*3/uL (ref 0.1–1.0)
Monocytes Relative: 9 %
Neutro Abs: 4.1 10*3/uL (ref 1.7–7.7)
Neutrophils Relative %: 56 %
Platelet Count: 325 10*3/uL (ref 150–400)
RBC: 4.34 MIL/uL (ref 3.87–5.11)
RDW: 13.1 % (ref 11.5–15.5)
WBC Count: 7.2 10*3/uL (ref 4.0–10.5)
nRBC: 0 % (ref 0.0–0.2)

## 2021-08-14 LAB — CMP (CANCER CENTER ONLY)
ALT: 14 U/L (ref 0–44)
AST: 12 U/L — ABNORMAL LOW (ref 15–41)
Albumin: 4.1 g/dL (ref 3.5–5.0)
Alkaline Phosphatase: 62 U/L (ref 38–126)
Anion gap: 8 (ref 5–15)
BUN: 14 mg/dL (ref 8–23)
CO2: 25 mmol/L (ref 22–32)
Calcium: 9.4 mg/dL (ref 8.9–10.3)
Chloride: 102 mmol/L (ref 98–111)
Creatinine: 0.77 mg/dL (ref 0.44–1.00)
GFR, Estimated: >60 mL/min (ref >60)
Glucose, Bld: 222 mg/dL — ABNORMAL HIGH (ref 70–99)
Potassium: 3.9 mmol/L (ref 3.5–5.1)
Sodium: 135 mmol/L (ref 135–145)
Total Bilirubin: 0.2 mg/dL — ABNORMAL LOW (ref 0.3–1.2)
Total Protein: 7 g/dL (ref 6.5–8.1)

## 2021-08-14 LAB — GENETIC SCREENING ORDER

## 2021-08-14 NOTE — Progress Notes (Signed)
[place] Clinical Social Work  ?Initial Assessment ? ? ?Lisa [name] is a 61 y.o. year old female accompanied by husband, Shaun. Clinical Social Work was referred by  [facility]  for assessment of psychosocial needs.  ? ?SDOH (Social Determinants of Health) assessments performed: Yes ?SDOH Interventions   ? ?Flowsheet Row Most Recent Value  ?SDOH Interventions   ?Food Insecurity Interventions Intervention Not Indicated  ?Financial Strain Interventions Other (Comment)  [breast cancer foundations]  ?Housing Interventions Intervention Not Indicated  ?Transportation Interventions Other (Comment)  [information on transport through insurance, [facility]]  ? ?  ?  ?Distress Screen completed: Yes ?ONCBCN DISTRESS SCREENING 08/14/2021  ?Screening Type Initial Screening  ?Distress experienced in past week (1-10) 7  ?Practical problem type Insurance;Transportation  ?Family Problem type Other (comment)  ?Emotional problem type Depression;Nervousness/Anxiety;Adjusting to illness;Isolation/feeling alone;Feeling hopeless  ?Physical Problem type Pain;Sleep/insomnia;Getting around;Bathing/dressing;Skin dry/itchy;Swollen arms/legs  ? ? ? ? ?Family/Social Information:  ?Housing Arrangement: patient lives with husband. Daughter, son-in-law, and their three kids live with pt.  Son lives in [place] ?Family members/support persons in your life? 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 Income source: applied for SSDI. Working on Medical laboratory scientific officer ?Financial concerns:  Yes, from medical bills from unrelated hospital stay ?Type of concern: Medical bills ?Food access concerns: no ?Services Currently in place:  professional counseling and psychiatric support [facility]) ? ?Coping/ Adjustment to diagnosis: ?Patient  understands treatment plan and what happens next? yes ?Concerns about diagnosis and/or treatment: How I will pay for the services I need and getting to daily radiation appts ?Patient reported stressors: Insurance, Transportation, Depression, Anxiety, and Adjusting to my illness ?Current coping skills/ strengths: Supportive family/friends  and Work skills  ? ? ? SUMMARY: ?Current SDOH Barriers:  ?Financial constraints related to medical bills and Transportation ? ?Clinical Social Work Clinical Goal(s):  ?Patient will apply for cancer foundation grants as eligible with assistance from [place].  ?Patient will contact CSW if needing assistance with transportation to appts if family and/or insurance cannot assist ? ?Interventions: ?Discussed common feeling and emotions when being diagnosed with cancer, and the importance of support during treatment ?Informed patient of the support team roles and support services at [facility] ?Provided CSW contact information and encouraged patient to call with any questions or concerns ?Provided patient with information about breast cancer foundations ? ? ?Follow Up Plan: Patient will work on Careers information officer as eligible and contact CSW for assistance submitting ?Patient verbalizes understanding of plan: Yes ? ? ? ?Khadeejah Castner E Rashawna Scoles, LCSW ?

## 2021-08-14 NOTE — Progress Notes (Signed)
REFERRING PROVIDER: Truitt Merle, MD [place], [place] [address]  PRIMARY PROVIDER:  Bertell Maria, FNP  PRIMARY REASON FOR VISIT:  1. Malignant neoplasm of upper-outer quadrant of left breast in female, estrogen receptor positive ([place])   2. Family history of breast cancer     HISTORY OF PRESENT ILLNESS:   Lisa [name], a 61 y.o. female, was seen for a [facility] cancer genetics consultation during the breast multidisciplinary clinic at the request of Dr. Burr Medico due to a personal and family history of cancer.  Lisa [name] presents to clinic today to discuss the possibility of a hereditary predisposition to cancer, to discuss genetic testing, and to further clarify her future cancer risks, as well as potential cancer risks for family members.   In February 2023, at the age of 38, Lisa [name] was diagnosed with invasive mammary carcinoma of the left breast.    CANCER HISTORY:  Oncology History Overview Note   Cancer Staging  Malignant neoplasm of upper-outer quadrant of left breast in female, estrogen receptor positive ([place]) Staging form: Breast, AJCC 8th Edition - Clinical stage from 08/06/2021: Stage IA (cT1c, cN0, cM0, G2, ER+, PR+, HER2-) - Signed by Truitt Merle, MD on 08/13/2021    Malignant neoplasm of upper-outer quadrant of left breast in female, estrogen receptor positive ([place])  07/27/2021 Mammogram   CLINICAL DATA:  Palpable lump in the left breast.   EXAM: DIGITAL DIAGNOSTIC BILATERAL MAMMOGRAM WITH TOMOSYNTHESIS AND CAD; ULTRASOUND LEFT BREAST LIMITED  IMPRESSION: Highly suspicious left breast mass. No left axillary adenopathy. No other abnormalities.   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 Based on the biopsy, the carcinoma appears Nottingham grade 2 of 3 and measures 1.5 cm in greatest linear extent.  E-cadherin is POSITIVE supporting a ductal origin.  PROGNOSTIC INDICATORS Results: The tumor cells are EQUIVOCAL for Her2 (2+). Her2 by FISH will be performed and the results reported separately. Estrogen Receptor: 100%, POSITIVE, STRONG-MODERATE STAINING INTENSITY Progesterone Receptor: 100%, POSITIVE, STRONG STAINING INTENSITY Proliferation Marker Ki67: 5%  FLUORESCENCE IN-SITU HYBRIDIZATION Results: GROUP 5: HER2 **NEGATIVE**   08/12/2021 Initial Diagnosis   Malignant neoplasm of upper-outer quadrant of left breast in female, estrogen receptor positive ([place])      RISK FACTORS:  Menarche was at age 24.  First live birth at age 31.  OCP use for <1 year Ovaries intact: yes.  Uterus intact: yes.  Menopausal status: postmenopausal.  HRT use: 0 years. Colonoscopy: yes; 2014 Mammogram within the last year: yes. 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    Meningitis 06/11/2021   bacterial meningitis   Polycystic ovarian disease     Past Surgical History:  Procedure Laterality Date   csf leak in skull  2005   noted failed attempt to close CSF leak in skull   DILATION AND EVACUATION  1990   miscarriage   IR FLUORO GUIDED NEEDLE PLC ASPIRATION/INJECTION LOC   06/12/2021   RADIOLOGY WITH ANESTHESIA N/A 06/12/2021   Procedure: IR WITH ANESTHESIA;  Surgeon: Radiologist, Medication, MD;  Location: [facility];  Service: Radiology;  Laterality: N/A;   TONSILLECTOMY AND ADENOIDECTOMY  1973    Social History   Socioeconomic History   Marital status: Married    Spouse name: Not on file   Number of children: 2   Years of education: Not on file   Highest education level: Not on file  Occupational History   Not on file  Tobacco Use   Smoking status: Former    Packs/day: 1.50    Years: 44.00    Pack years: 66.00    Types: Cigarettes    Quit date: 06/10/2021    Years since quitting: 0.1   Smokeless tobacco: Never  Substance and Sexual Activity   Alcohol use: No   Drug use: No   Sexual activity: Yes    Birth control/protection: Other-see comments    Comment: vasectomy  Other Topics Concern   Not on file  Social History Narrative   Not on file   Social Determinants of Health   Financial Resource Strain: Medium Risk   Difficulty of Paying Living Expenses: Somewhat hard  Food Insecurity: No Food Insecurity   Worried About Charity fundraiser in the Last Year: Never true   Ran Out of Food in the Last Year: Never true  Transportation Needs: No Transportation Needs   Lack of Transportation (Medical): No   Lack of Transportation (Non-Medical): No  Physical Activity: Not on file  Stress: Not on file  Social Connections: Not on file     FAMILY HISTORY:  We obtained a detailed, 4-generation family history.  Significant diagnoses are listed below: Family History  Problem Relation Age of Onset   Prostate cancer Father    Breast cancer Sister    Breast cancer Sister    Bladder Cancer Maternal Uncle    Breast cancer Paternal 60    Breast cancer Paternal Grandmother      Lisa [name]'s sister was diagnosed with breast cancer at age 64 and died at age 97. She has another sister who was diagnosed with breast cancer at age 43, she is currently 31. Both  sisters reportedly had negative hereditary cancer genetic testing. Her maternal uncle was diagnosed with cancer, possibly bladder cancer at an unknown age. Her father possibly had prostate cancer, he died at age 61 due to CHF. Her paternal half-aunt was diagnosed with breast cancer at an unknown age (>50), she is deceased. Her paternal grandmother was diagnosed with breast cancer in her 71s, she is deceased. There is no reported Ashkenazi Jewish ancestry.   GENETIC COUNSELING ASSESSMENT: Lisa [name] is a 61 y.o. female with a personal and family history of cancer which is somewhat suggestive of a hereditary cancer syndrome and predisposition to cancer. We, therefore, discussed and recommended the following at today's visit.   DISCUSSION: We discussed that 5 - 10% of cancer is hereditary, with most cases of hereditary breast cancer associated with mutations in BRCA1/2.  There are other genes that can be associated with  hereditary breast cancer syndromes. Type of cancer risk and level of risk are gene-specific. We discussed that testing is beneficial for several reasons including knowing how to follow individuals after completing their treatment, identifying whether potential treatment options would be beneficial, and understanding if other family members could be at risk for cancer and allowing them to undergo genetic testing.   We reviewed the characteristics, features and inheritance patterns of hereditary cancer syndromes. We also discussed genetic testing, including the appropriate family members to test, the process of testing, insurance coverage and turn-around-time for results. We discussed the implications of a negative, positive and/or variant of uncertain significant result. In order to get genetic test results in a timely manner so that Lisa [name] can use these genetic test results for surgical decisions, we recommended Lisa [name] pursue genetic testing for the BRCAplus. Once complete, we recommend  Lisa [name] pursue reflex genetic testing to a more comprehensive gene panel.   Lisa [name]  was offered a common hereditary cancer panel (47 genes) and an expanded pan-cancer panel (77 genes). Lisa [name] was informed of the benefits and limitations of each panel, including that expanded pan-cancer panels contain genes that do not have clear management guidelines at this point in time.  We also discussed that as the number of genes included on a panel increases, the chances of variants of uncertain significance increases.  After considering the benefits and limitations of each gene panel, Lisa [name] elected to have [place] Panel+RNA.  The CustomNext-Cancer+RNAinsight panel offered by [facility] includes sequencing and rearrangement analysis for the following 47 genes:  APC, ATM, AXIN2, BARD1, BMPR1A, BRCA1, BRCA2, BRIP1, CDH1, CDK4, CDKN2A, CHEK2, CTNNA1, DICER1, EPCAM, GREM1, HOXB13, KIT, MEN1, MLH1, MSH2, MSH3, MSH6, MUTYH, NBN, NF1, NTHL1, PALB2, PDGFRA, PMS2, POLD1, POLE, PTEN, RAD50, RAD51C, RAD51D, SDHA, SDHB, SDHC, SDHD, SMAD4, SMARCA4, STK11, TP53, TSC1, TSC2, and VHL.  RNA data is routinely analyzed for use in variant interpretation for all genes.  Based on Ms. Pritt's personal and family history of cancer, she meets medical criteria for genetic testing. Despite that she meets criteria, she may still have an out of pocket cost. We discussed that if her out of pocket cost for testing is over $100, the laboratory should contact them to discuss self-pay prices, patient pay assistance programs, if applicable, and other billing options.   PLAN: After considering the risks, benefits, and limitations, Lisa [name] provided informed consent to pursue genetic testing and the blood sample was sent to [facility] for analysis of the CustomNext Panel+RNA. Results should be available within approximately 1-2 weeks' time, at which point they will be disclosed by telephone to Lisa [name], as will  any additional recommendations warranted by these results. Lisa [name] will receive a summary of her genetic counseling visit and a copy of her results once available. This information will also be available in Epic.   Lisa [name] questions were answered to her satisfaction today. Our contact information was provided should additional questions or concerns arise. Thank you for the referral and allowing [place] to share in the care of your patient.   Lucille Passy, MS, [facility] Genetic Counselor [email or web address] (P) [phone]  The patient was seen for a total of 20 minutes in face-to-face genetic counseling.  The patient brought her husband. Drs. Lindi Adie and/or Burr Medico were available to discuss this case as needed.  _______________________________________________________________________ For Office Staff:  Number of people involved in session: 2 Was an Intern/ student involved with case: no

## 2021-08-14 NOTE — Research (Signed)
[employer] 2021-05 - Specimen Collection Study to Evaluate Biomarkers in Subjects with Cancer  ? ?INTRO STUDY/CONSENT ? ?Patient Lisa [name] was identified by MD Burr Medico as a potential candidate for the above listed study.  This Clinical Research Nurse met with Lisa [name], [id number], on 08/14/21 in a manner and location that ensures patient privacy to discuss participation in the above listed research study.  Patient is Accompanied by husband Lisa [name] .  A copy of the informed consent document with embedded HIPAA language was provided to the patient.  Patient reads, speaks, and understands [place].   ?Patient was provided with the business card of this Nurse and encouraged to contact the research team with any questions.  Approximately 15 minutes were spent with the patient reviewing the informed consent documents.  Patient was provided the option of taking informed consent documents home to review and was encouraged to review at their convenience with their support network, including other care providers. Patient took the consent documents home to review.  Will f/u with patient in next few days to determine interest. ? ?Wells Guiles 'LizaNeysa Bonito, RN, BSN ?Clinical Research Nurse I ?08/14/21 ?12:21 PM ? ? ?

## 2021-08-14 NOTE — Progress Notes (Signed)
[place]   [phone] Fax:([phone] Bear Note   Patient Care Team: Bertell Maria, FNP as PCP - General (Family Medicine) Mauro Kaufmann, RN as Oncology Nurse Navigator Rockwell Germany, RN as Oncology Nurse Navigator Stark Klein, MD as Consulting Physician (General Surgery) Truitt Merle, MD as Consulting Physician (Hematology) Eppie Gibson, MD as Attending Physician (Radiation Oncology)  Date of Service:  08/14/2021   CHIEF COMPLAINTS/PURPOSE OF CONSULTATION:  Left Breast Cancer, ER+  REFERRING PHYSICIAN:  [facility]   ASSESSMENT & PLAN:  Lisa [name] is a 61 y.o. postmenopausal female with   1. 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 B/l diagnostic MM and left [place] on 07/27/21 showed 14 mm mass at 3 o'clock. Biopsy on 08/06/21 showed invasive and in situ mammary carcinoma, e-cadherin positive. --We discussed her imaging findings and the biopsy results in great details. -Given the early stage disease, she likely need a lumpectomy and sentinel lymph node biopsy. She is agreeable with that. She was seen by Dr. Barry Dienes today and likely will proceed with surgery soon.  -I recommend a Oncotype Dx test on the surgical sample and we'll make a decision about adjuvant chemotherapy based on the Oncotype result. Written material of this test was given to her. She is a candidate for chemotherapy if her Oncotype recurrence score is high. -Giving the strong ER and PR expression in her postmenopausal status, I recommend adjuvant endocrine therapy with aromatase inhibitor for a total of 5-10 years to reduce the risk of cancer recurrence. I would recommend exemestane given her arthritis and potential interaction of Tamoxifen with her antidepressants. Potential benefits and side effects were discussed with  patient and she is interested. -She was also seen by radiation oncologist Dr. Isidore Moos today. She will likely benefit from breast radiation if she undergo lumpectomy to decrease the risk of breast cancer.  -We also discussed the breast cancer surveillance after her surgery. She will continue annual screening mammogram, self exam, and a routine office visit with lab and exam with [place]. -I encouraged her to have healthy diet and exercise regularly.    2. DM, depression, HTN, obesity, arthritis  -f/u with PC   PLAN:  -proceed with lumpectomy and sentinel lymph node biopsy with Dr. Barry Dienes -oncotype on surgical sample -I will see her back either after surgery or radiation   Oncology History Overview Note   Cancer Staging  Malignant neoplasm of upper-outer quadrant of left breast in female, estrogen receptor positive ([place]) Staging form: Breast, AJCC 8th Edition - Clinical stage from 08/06/2021: Stage IA (cT1c, cN0, cM0, G2, ER+, PR+, HER2-) - Signed by Truitt Merle, MD on 08/13/2021    Malignant neoplasm of upper-outer quadrant of left breast in female, estrogen receptor positive ([place])  07/27/2021 Mammogram   CLINICAL DATA:  Palpable lump in the left breast.   EXAM: DIGITAL DIAGNOSTIC BILATERAL MAMMOGRAM WITH TOMOSYNTHESIS AND CAD; ULTRASOUND LEFT BREAST LIMITED  IMPRESSION: Highly suspicious left breast mass. No left axillary adenopathy. No other abnormalities.   08/06/2021 Cancer Staging   Staging form: Breast, AJCC 8th Edition - Clinical stage from 08/06/2021: Stage IA (cT1c, cN0, cM0, G2, ER+, PR+, HER2-) - Signed by Truitt Merle, MD on 08/13/2021 Stage prefix: Initial diagnosis Histologic grading system: 3 grade system    08/06/2021 Initial Biopsy   Diagnosis Breast, left, needle core biopsy,  Retroareolar 1.6cm left mass 3:00 (ribbon clip) - INVASIVE MAMMARY CARCINOMA - DUCTAL CARCINOMA IN SITU - SEE COMMENT Microscopic Comment The biopsy material shows an infiltrative proliferation of  cells arranged linearly and in small clusters. Based on the biopsy, the carcinoma appears Nottingham grade 2 of 3 and measures 1.5 cm in greatest linear extent.  E-cadherin is POSITIVE supporting a ductal origin.  PROGNOSTIC INDICATORS Results: The tumor cells are EQUIVOCAL for Her2 (2+). Her2 by FISH will be performed and the results reported separately. Estrogen Receptor: 100%, POSITIVE, STRONG-MODERATE STAINING INTENSITY Progesterone Receptor: 100%, POSITIVE, STRONG STAINING INTENSITY Proliferation Marker Ki67: 5%  FLUORESCENCE IN-SITU HYBRIDIZATION Results: GROUP 5: HER2 **NEGATIVE**   08/12/2021 Initial Diagnosis   Malignant neoplasm of upper-outer quadrant of left breast in female, estrogen receptor positive ([place])      HISTORY OF PRESENTING ILLNESS:  Lisa [name] 61 y.o. female is a here because of breast cancer. The patient was referred by [facility]. The patient presents to the clinic today accompanied by her husband.   She presented with a palpable left breast mass. She initially noticed the lump while she was in the hospital for meningitis in 06/2021. She underwent bilateral diagnostic mammography and left breast ultrasonography on 07/27/21 showing: suspicious 14 mm retroareolar mass at 3 o'clock.  Biopsy on 08/06/21 showed: invasive mammary carcinoma, e-cadherin positive, grade 2; DCIS. Prognostic indicators significant for: estrogen receptor, 100% positive and progesterone receptor, 100% positive. Proliferation marker Ki67 at 5%. HER2 negative by FISH.   Today the patient notes they felt/feeling prior/after... -she denies any other breast concerns such as associated pain or skin changes. -she initially lost weight while in the hospital  She has a PMHx of.... 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 -she worked as a Marine scientist in the hospital for 35 years. -she is married with two children. -she reports breast cancer in both sisters and their paternal grandmother. She notes both sisters had negative genetic testing. -she notes she quit smoking with her hospital visit  GYN HISTORY  Menarchal: 61 years old (polycystic ovarian disease) LMP: ~2015 Contraceptive: used BCP for about a year following miscarriage HRT: none G3P2: first at age 15   REVIEW OF SYSTEMS:  Constitutional: Denies fevers, chills or abnormal night sweats Eyes: Denies blurriness of vision, double vision or watery eyes Ears, nose, mouth, throat, and face: Denies mucositis or sore throat Respiratory: Denies cough, dyspnea or wheezes Cardiovascular: Denies palpitation, chest discomfort or lower extremity swelling Gastrointestinal:  Denies nausea, heartburn or change in bowel habits Skin: Denies abnormal skin rashes Lymphatics: Denies new lymphadenopathy or easy bruising Neurological:Denies numbness, tingling or new weaknesses Behavioral/Psych: Mood is stable, no new changes  All other systems were reviewed with the patient and are negative.   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    Meningitis 06/11/2021   bacterial meningitis   Polycystic ovarian  disease     SURGICAL HISTORY: Past Surgical History:  Procedure Laterality Date   csf leak in skull  2005   noted failed attempt to close CSF leak in skull   DILATION AND EVACUATION  1990   miscarriage   IR FLUORO GUIDED NEEDLE PLC ASPIRATION/INJECTION LOC  06/12/2021   RADIOLOGY WITH ANESTHESIA  N/A 06/12/2021   Procedure: IR WITH ANESTHESIA;  Surgeon: Radiologist, Medication, MD;  Location: [place];  Service: Radiology;  Laterality: N/A;   TONSILLECTOMY AND ADENOIDECTOMY  1973    SOCIAL HISTORY: Social History   Socioeconomic History   Marital status: Married    Spouse name: Not on file   Number of children: 2   Years of education: Not on file   Highest education level: Not on file  Occupational History   Not on file  Tobacco Use   Smoking status: Former    Packs/day: 1.50    Years: 44.00    Pack years: 66.00    Types: Cigarettes    Quit date: 06/10/2021    Years since quitting: 0.1   Smokeless tobacco: Never  Substance and Sexual Activity   Alcohol use: No   Drug use: No   Sexual activity: Yes    Birth control/protection: Other-see comments    Comment: vasectomy  Other Topics Concern   Not on file  Social History Narrative   Not on file   Social Determinants of Health   Financial Resource Strain: Medium Risk   Difficulty of Paying Living Expenses: Somewhat hard  Food Insecurity: No Food Insecurity   Worried About Charity fundraiser in the Last Year: Never true   Ran Out of Food in the Last Year: Never true  Transportation Needs: No Transportation Needs   Lack of Transportation (Medical): No   Lack of Transportation (Non-Medical): No  Physical Activity: Not on file  Stress: Not on file  Social Connections: Not on file  Intimate Partner Violence: Not on file    FAMILY HISTORY: Family History  Problem Relation Age of Onset   Arthritis Mother    Hypertension Mother    Obesity Mother    Arthritis Father    Prostate cancer Father    Heart disease Father    Marfan syndrome Father    Hyperlipidemia Sister    Diabetes Sister    Marfan syndrome Sister    Breast cancer Sister    Early death Sister    Stroke Sister    Breast cancer Sister    Arthritis Sister    Diabetes Maternal Aunt    Diabetes Maternal Uncle    Bladder Cancer Maternal Uncle     Breast cancer Paternal Aunt    Hypertension Maternal Grandmother    Heart disease Maternal Grandmother    Diabetes Paternal Grandmother    Heart disease Paternal Grandmother    Stroke Paternal Grandmother    Breast cancer Paternal Grandmother    Heart disease Paternal Grandfather     ALLERGIES:  is allergic to statins and glimepiride.  MEDICATIONS:  Current Outpatient Medications  Medication Sig Dispense Refill   acetaminophen (TYLENOL) 325 MG tablet Take 2 tablets (650 mg total) by mouth every 6 (six) hours as needed for fever (temp > 101).     albuterol (VENTOLIN HFA) 108 (90 Base) MCG/ACT inhaler Inhale 2 puffs into the lungs every 6 (six) hours as needed for wheezing. 8.5 g 0   ALPRAZolam (XANAX) 0.25 MG tablet Take 1 tablet (0.25 mg total) by mouth 2 (  two) times daily as needed for anxiety. 20 tablet 0   aspirin EC 81 MG tablet Take 81 mg by mouth daily. Swallow whole.     buPROPion (WELLBUTRIN XL) 300 MG 24 hr tablet Take 1 tablet (300 mg total) by mouth daily. 30 tablet 0   diclofenac (VOLTAREN) 75 MG EC tablet Take 1 tablet by mouth twice a day as needed 180 tablet 0   diclofenac Sodium (VOLTAREN) 1 % GEL Apply 4 g topically every 6 (six) hours as needed (Shoulder pain). 100 g 0   Eszopiclone 3 MG TABS TAKE 1 TABLET BY MOUTH ONCE DAILY IMMEDIATELY BEFORE BEDTIME 30 tablet 0   ezetimibe (ZETIA) 10 MG tablet Take 1 tablet by mouth once a day 60 tablet 0   insulin glargine (LANTUS) 100 UNIT/ML Solostar Pen Inject 5 Units into the skin at bedtime. 15 mL 11   liraglutide (VICTOZA) 18 MG/3ML SOPN Inject 0.6 mg into the skin daily.     lisinopril (ZESTRIL) 5 MG tablet Take 1 tablet (5 mg total) by mouth daily. 30 tablet 0   sertraline (ZOLOFT) 100 MG tablet Take 1 tablet (100 mg total) by mouth daily. (Patient taking differently: Take 200 mg by mouth daily.) 30 tablet 0   spironolactone (ALDACTONE) 25 MG tablet TAKE 1 TABLET BY MOUTH ONCE DAILY (Patient taking differently: Take 25 mg by  mouth daily.) 90 tablet 2   No current facility-administered medications for this visit.    PHYSICAL EXAMINATION: ECOG PERFORMANCE STATUS: 1 - Symptomatic but completely ambulatory  Vitals:   08/14/21 0903  BP: 135/78  Pulse: 93  Resp: 18  Temp: 98.8 F (37.1 C)  SpO2: 97%   Filed Weights   08/14/21 0903  Weight: 254 lb 9.6 oz (115.5 kg)    GENERAL:alert, no distress and comfortable SKIN: skin color, texture, turgor are normal, no rashes or significant lesions EYES: normal, Conjunctiva are pink and non-injected, sclera clear  NECK: supple, thyroid normal size, non-tender, without nodularity LYMPH:  no palpable lymphadenopathy in the cervical, axillary  Musculoskeletal:no cyanosis of digits and no clubbing  NEURO: alert & oriented x 3 with fluent speech, no focal motor/sensory deficits BREAST: palpable 1 cm mass in UOQ of left breast, 1 cmfn. No axillary adenopathy   LABORATORY DATA:  I have reviewed the data as listed CBC Latest Ref Rng & Units 08/14/2021 07/01/2021 06/27/2021  WBC 4.0 - 10.5 K/uL 7.2 7.0 6.5  Hemoglobin 12.0 - 15.0 g/dL 13.3 13.3 13.3  Hematocrit 36.0 - 46.0 % 38.7 40.2 38.8  Platelets 150 - 400 K/uL 325 394 422(H)    CMP Latest Ref Rng & Units 08/14/2021 06/27/2021 06/21/2021  Glucose 70 - 99 mg/dL 222(H) 138(H) 130(H)  BUN 8 - 23 mg/dL 14 14 5(L)  Creatinine 0.44 - 1.00 mg/dL 0.77 0.62 0.74  Sodium 135 - 145 mmol/L 135 135 136  Potassium 3.5 - 5.1 mmol/L 3.9 4.2 3.9  Chloride 98 - 111 mmol/L 102 100 103  CO2 22 - 32 mmol/L 25 26 25   Calcium 8.9 - 10.3 mg/dL 9.4 9.1 8.3(L)  Total Protein 6.5 - 8.1 g/dL 7.0 6.5 5.7(L)  Total Bilirubin 0.3 - 1.2 mg/dL 0.2(L) 0.5 0.4  Alkaline Phos 38 - 126 U/L 62 51 48  AST 15 - 41 U/L 12(L) 17 46(H)  ALT 0 - 44 U/L 14 25 55(H)     RADIOGRAPHIC STUDIES: I have personally reviewed the radiological images as listed and agreed with the findings in the report. US  BREAST LTD UNI LEFT INC AXILLA  Result Date:  07/27/2021 CLINICAL DATA:  Palpable lump in the left breast. EXAM: DIGITAL DIAGNOSTIC BILATERAL MAMMOGRAM WITH TOMOSYNTHESIS AND CAD; ULTRASOUND LEFT BREAST LIMITED TECHNIQUE: Bilateral digital diagnostic mammography and breast tomosynthesis was performed. The images were evaluated with computer-aided detection.; Targeted ultrasound examination of the left breast was performed. COMPARISON:  Previous exam(s). ACR Breast Density Category b: There are scattered areas of fibroglandular density. FINDINGS: There is a spiculated mass in the left breast at 3 o'clock accounting accounting for the patient's lump. No other suspicious findings in either breast. Targeted ultrasound is performed, showing an irregular mass in the left breast at 3 o'clock in the retroareolar region measuring 13 x 14 x 11 mm. No axillary adenopathy. IMPRESSION: Highly suspicious left breast mass. No left axillary adenopathy. No other abnormalities. RECOMMENDATION: Recommend ultrasound-guided biopsy of the left breast mass. I have discussed the findings and recommendations with the patient. If applicable, a reminder letter will be sent to the patient regarding the next appointment. BI-RADS CATEGORY  5: Highly suggestive of malignancy. Electronically Signed   By: Dorise Bullion III M.D.   On: 07/27/2021 12:16  MM DIAG BREAST TOMO BILATERAL  Result Date: 07/27/2021 CLINICAL DATA:  Palpable lump in the left breast. EXAM: DIGITAL DIAGNOSTIC BILATERAL MAMMOGRAM WITH TOMOSYNTHESIS AND CAD; ULTRASOUND LEFT BREAST LIMITED TECHNIQUE: Bilateral digital diagnostic mammography and breast tomosynthesis was performed. The images were evaluated with computer-aided detection.; Targeted ultrasound examination of the left breast was performed. COMPARISON:  Previous exam(s). ACR Breast Density Category b: There are scattered areas of fibroglandular density. FINDINGS: There is a spiculated mass in the left breast at 3 o'clock accounting accounting for the patient's  lump. No other suspicious findings in either breast. Targeted ultrasound is performed, showing an irregular mass in the left breast at 3 o'clock in the retroareolar region measuring 13 x 14 x 11 mm. No axillary adenopathy. IMPRESSION: Highly suspicious left breast mass. No left axillary adenopathy. No other abnormalities. RECOMMENDATION: Recommend ultrasound-guided biopsy of the left breast mass. I have discussed the findings and recommendations with the patient. If applicable, a reminder letter will be sent to the patient regarding the next appointment. BI-RADS CATEGORY  5: Highly suggestive of malignancy. Electronically Signed   By: Dorise Bullion III M.D.   On: 07/27/2021 12:16  MM CLIP PLACEMENT LEFT  Result Date: 08/06/2021 CLINICAL DATA:  Post biopsy mammogram of the left breast for clip placement. EXAM: 3D DIAGNOSTIC LEFT MAMMOGRAM POST ULTRASOUND BIOPSY COMPARISON:  Previous exam(s). FINDINGS: 3D Mammographic images were obtained following ultrasound guided biopsy of a mass in the lateral retroareolar left breast. The biopsy marking clip is in expected position at the site of biopsy. IMPRESSION: Appropriate positioning of the ribbon shaped biopsy marking clip at the site of biopsy in the lateral retroareolar left breast. Final Assessment: Post Procedure Mammograms for Marker Placement Electronically Signed   By: Ammie Ferrier M.D.   On: 08/06/2021 13:32  [place] LT BREAST BX W LOC DEV 1ST LESION IMG BX SPEC US GUIDE  Addendum Date: 08/08/2021   ADDENDUM REPORT: 08/08/2021 08:18 ADDENDUM: Pathology revealed GRADE II INVASIVE MAMMARY CARCINOMA, DUCTAL CARCINOMA IN SITU of the LEFT breast, retroareolar, 3:00 o'clock, (ribbon clip). This was found to be concordant by Dr. Ammie Ferrier. Pathology results were discussed with the patient by telephone. The patient reported doing well after the biopsy with minimal tenderness at the site. Post biopsy instructions and care were reviewed and questions were  answered.  The patient was encouraged to call [facility] for any additional concerns. My direct phone number was provided. The patient was referred to [facility] on August 14, 2021. Pathology results reported by Terie Purser, RN on 08/07/2021. Electronically Signed   By: Ammie Ferrier M.D.   On: 08/08/2021 08:18   Result Date: 08/08/2021 CLINICAL DATA:  61 year old female presenting for ultrasound-guided biopsy of a left breast mass. She has strong family history of breast cancer. EXAM: ULTRASOUND GUIDED LEFT BREAST CORE NEEDLE BIOPSY COMPARISON:  Previous exam(s). PROCEDURE: I met with the patient and we discussed the procedure of ultrasound-guided biopsy, including benefits and alternatives. We discussed the high likelihood of a successful procedure. We discussed the risks of the procedure, including infection, bleeding, tissue injury, clip migration, and inadequate sampling. Informed written consent was given. The usual time-out protocol was performed immediately prior to the procedure. Lesion quadrant: Lower outer quadrant Using sterile technique and 1% Lidocaine as local anesthetic, under direct ultrasound visualization, a 14 gauge spring-loaded device was used to perform biopsy of a mass in the retroareolar left breast at 3 o'clock using an inferior approach. At the conclusion of the procedure ribbon tissue marker clip was deployed into the biopsy cavity. Follow up 2 view mammogram was performed and dictated separately. IMPRESSION: Ultrasound guided biopsy of a mass in the retroareolar left breast at 3 o'clock. No apparent complications. Electronically Signed: By: Ammie Ferrier M.D. On: 08/06/2021 13:26    No orders of the defined types were placed in this encounter.   All questions were answered. The patient knows to call the clinic with any problems, questions or concerns. The total time spent  in the appointment was 60 minutes.     Truitt Merle, MD 08/14/2021   I, Wilburn Mylar, am acting as scribe for Truitt Merle, MD.   I have reviewed the above documentation for accuracy and completeness, and I agree with the above.

## 2021-08-16 ENCOUNTER — Encounter (HOSPITAL_BASED_OUTPATIENT_CLINIC_OR_DEPARTMENT_OTHER): Payer: Self-pay | Admitting: General Surgery

## 2021-08-16 ENCOUNTER — Other Ambulatory Visit: Payer: Self-pay

## 2021-08-16 ENCOUNTER — Other Ambulatory Visit (HOSPITAL_BASED_OUTPATIENT_CLINIC_OR_DEPARTMENT_OTHER): Payer: Self-pay

## 2021-08-16 MED ORDER — BUPROPION HCL ER (XL) 300 MG PO TB24
ORAL_TABLET | ORAL | 0 refills | Status: DC
  Filled 2021-08-16: qty 30, 30d supply, fill #0
  Filled 2021-09-16: qty 30, 30d supply, fill #1

## 2021-08-16 MED ORDER — METFORMIN HCL 500 MG PO TABS
ORAL_TABLET | ORAL | 0 refills | Status: DC
  Filled 2021-08-16: qty 90, 30d supply, fill #0

## 2021-08-19 ENCOUNTER — Other Ambulatory Visit (HOSPITAL_BASED_OUTPATIENT_CLINIC_OR_DEPARTMENT_OTHER): Payer: Self-pay

## 2021-08-19 MED ORDER — SERTRALINE HCL 100 MG PO TABS
ORAL_TABLET | ORAL | 1 refills | Status: DC
Start: 2021-08-19 — End: 2022-01-21
  Filled 2021-08-19: qty 60, 30d supply, fill #0
  Filled 2021-09-16: qty 60, 30d supply, fill #1

## 2021-08-21 ENCOUNTER — Other Ambulatory Visit: Payer: Self-pay

## 2021-08-21 ENCOUNTER — Encounter: Payer: Self-pay | Admitting: Physical Therapy

## 2021-08-21 ENCOUNTER — Telehealth: Payer: Self-pay | Admitting: Emergency Medicine

## 2021-08-21 ENCOUNTER — Ambulatory Visit: Payer: Managed Care, Other (non HMO) | Attending: Hematology | Admitting: Physical Therapy

## 2021-08-21 DIAGNOSIS — C50412 Malignant neoplasm of upper-outer quadrant of left female breast: Secondary | ICD-10-CM | POA: Insufficient documentation

## 2021-08-21 DIAGNOSIS — R293 Abnormal posture: Secondary | ICD-10-CM | POA: Diagnosis present

## 2021-08-21 DIAGNOSIS — Z170 Estrogen receptor positive status [ER+]: Secondary | ICD-10-CM | POA: Diagnosis present

## 2021-08-21 MED ORDER — CHLORHEXIDINE GLUCONATE CLOTH 2 % EX PADS: 6.0000 | MEDICATED_PAD | Freq: Once | CUTANEOUS | Status: DC

## 2021-08-21 NOTE — H&P (Signed)
?REFERRING PHYSICIAN: Ammie Ferrier, MD ? ?PROVIDER: Georgianne Fick, MD ? ?Care Team: Patient Care Team: ?Collene Leyden, MD as PCP - General (Family Medicine) ?Georgianne Fick, MD as Consulting Provider (Surgical Oncology) ?Acquanetta Belling, MD (Radiation Oncology) ?Truitt Merle, MD (Hematology and Oncology) ?Bertell Maria, NP (Family Medicine)  ? ?MRN: [id number] ?DOB: 1960-10-06 ?DATE OF ENCOUNTER: 08/14/2021 ? ?Subjective  ? ?Chief Complaint: Breast Cancer ? ? ?History of Present Illness: ?Lisa [name] is a 61 y.o. female who is seen today as an office consultation at the request of Dr. Theda Sers for evaluation of Breast Cancer ?.  ? ?Patient is a 61 year old female who presented with a palpable left breast mass. She was in the hospital in January for meningitis. While there, she felt a suspicious mass in the left breast. When she was discharged, she underwent work-up. This showed a 14 mm mass at 3:00 in the retroareolar location. A core needle biopsy was performed. 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 The receptors were strongly ER and PR positive, HER2 negative, and Ki-67 of 5%. ? ?The patient does have an extensive family history of breast cancer. She has had 2 sisters with breast cancer. They were both in their 79s. One of them died of disease. She also had a paternal grandmother and paternal aunt with breast cancer. Her maternal uncle had bladder cancer. ? ?She had menarche at age 56. She is postmenopausal. This occurred approximately age 63. She did not use any hormone replacement. She has had 2 children and did have a miscarriage. She used hormonal contraception for a brief period of time. She is up-to-date with colonoscopy. She had a last Pap smear in 2021. ? ?Her admission for meningitis was quite extensive. She required intubation and discharged to rehab facility. She is still recovering from this. ? ?Diagnostic mammogram/us: 07/27/2021 ?  ?ACR Breast  Density Category b: There are scattered areas of ?fibroglandular density. ?  ?FINDINGS: ?There is a spiculated mass in the left breast at 3 o'clock ?accounting accounting for the patient's lump. No other suspicious ?findings in either breast. ?  ?Targeted ultrasound is performed, showing an irregular mass in the ?left breast at 3 o'clock in the retroareolar region measuring 13 x ?14 x 11 mm. No axillary adenopathy. ?  ?IMPRESSION: ?Highly suspicious left breast mass. No left axillary adenopathy. No ?other abnormalities. ?  ?RECOMMENDATION: ?Recommend ultrasound-guided biopsy of the left breast mass. ?  ?I have discussed the findings and recommendations with the patient. ?If applicable, a reminder letter will be sent to the patient ?regarding the next appointment. ?  ?BI-RADS CATEGORY 5: Highly suggestive of malignancy. ? ?Pathology core needle biopsy: 08/06/2021 ?Breast, left, needle core biopsy, Retroareolar 1.6cm left mass 3:00 (ribbon clip) ?- INVASIVE MAMMARY CARCINOMA ?- DUCTAL CARCINOMA IN SITU ?Based on the biopsy, the carcinoma appears Nottingham grade 2 of 3 and measures 1.5 cm in greatest linear extent. ?E-cadherin is POSITIVE supporting a ductal origin. ? ?Receptors: ?Estrogen Receptor: 100%, POSITIVE, STRONG-MODERATE STAINING INTENSITY ?Progesterone Receptor: 100%, POSITIVE, STRONG STAINING INTENSITY ?HER2 Negative ?Proliferation Marker Ki67: 5% ? ?Review of Systems: ?A complete review of systems was obtained from the patient. I have reviewed this information and discussed as appropriate with the patient. See HPI as well for other ROS. ? ?Review of Systems  ?Constitutional: Negative.  ?HENT: Negative.  ?Eyes: Negative.  ?Respiratory: Positive for shortness of breath.  ?Cardiovascular: Positive for leg swelling.  ?Gastrointestinal: Negative.  ?Genitourinary: Negative.  ?Musculoskeletal: Positive  for joint pain.  ?Neurological: Positive for headaches.  ?Endo/Heme/Allergies: Negative.   ?Psychiatric/Behavioral: Positive for depression. The patient is nervous/anxious.  ?All other systems reviewed and are negative. ? ? ?Medical History: ?Past Medical History:  ?Diagnosis Date  ? Anxiety  ? Arthritis  ? Diabetes mellitus without complication (CMS-HCC)  ? Hyperlipidemia  ? Hypertension  ? ?Patient Active Problem List  ?Diagnosis  ? Breast mass, left  ? Essential hypertension  ? Family history of breast cancer in sister  ? Hyperlipidemia  ? Malignant neoplasm of upper-outer quadrant of left breast in female, estrogen receptor positive (CMS-HCC)  ? ?Past Surgical History:  ?Procedure Laterality Date  ? Dilation and evacuation N/A  ?1990  ? Tonsilectomy and adenoidectomy N/A  ?1973  ? ? ?Allergies  ?Allergen Reactions  ? Statins-Hmg-Coa Reductase Inhibitors Other (See Comments)  ?CSF leaking out nose.  ?Other reaction(s): Other ?CSF leaking out nose. Headache ? ? Glimepiride Other (See Comments)  ?Hypoglicemia ? ? ?Current Outpatient Medications on File Prior to Visit  ?Medication Sig Dispense Refill  ? acetaminophen (TYLENOL) 325 MG tablet Take by mouth  ? albuterol 90 mcg/actuation inhaler Inhale into the lungs  ? buPROPion (WELLBUTRIN XL) 300 MG XL tablet Wellbutrin XL 300 mg 24 hr tablet, extended release  ? diclofenac (VOLTAREN) 1 % topical gel Apply topically  ? diclofenac (VOLTAREN) 75 MG EC tablet diclofenac sodium 75 mg tablet,delayed release  ? ezetimibe (ZETIA) 10 mg tablet 1 tablet  ? pantoprazole (PROTONIX) 40 MG DR tablet Take 40 mg by mouth at bedtime  ? sertraline (ZOLOFT) 100 MG tablet sertraline 100 mg tablet  ? aspirin 81 MG EC tablet Take by mouth  ? LANTUS SOLOSTAR U-100 INSULIN pen injector (concentration 100 units/mL) INJECT 20 UNITS INTO THE SKIN AT BEDTIME.  ? lisinopriL (ZESTRIL) 5 MG tablet 1 tablet  ? spironolactone (ALDACTONE) 25 MG tablet TAKE ONE TABLET (25 MG DOSE) BY MOUTH DAILY.  ? ?No current facility-administered medications on file prior to visit.  ? ?Family History   ?Problem Relation Age of Onset  ? High blood pressure (Hypertension) Mother  ? Heart valve disease Father  ? Prostate cancer Father  ? Breast cancer Sister  ? Diabetes Sister  ? Hyperlipidemia (Elevated cholesterol) Sister  ? High blood pressure (Hypertension) Maternal Grandmother  ? Breast cancer Maternal Grandmother  ? Diabetes Paternal Grandmother  ? ? ?Social History  ? ?Tobacco Use  ?Smoking Status Every Day  ? Packs/day: 1.50  ? Years: 35.00  ? Pack years: 52.50  ? Types: Cigarettes  ?Smokeless Tobacco Never  ? ? ?Social History  ? ?Socioeconomic History  ? Marital status: Married  ?Tobacco Use  ? Smoking status: Every Day  ?Packs/day: 1.50  ?Years: 35.00  ?Pack years: 52.50  ?Types: Cigarettes  ? Smokeless tobacco: Never  ? ?Objective:  ? ?Vitals:  ?08/14/21 1709  ?BP: 135/78  ?Pulse: 93  ?Resp: 18  ?Temp: 37.1 ?C (98.8 ?F)  ?Weight: (!) 115.5 kg (254 lb 9.6 oz)  ?Height: 167.6 cm (5' 6")  ? ?Body mass index is 41.09 kg/m?. ? ?Gen: No acute distress. Well nourished and well groomed.  ?Neurological: Alert and oriented to person, place, and time. Coordination normal.  ?Head: Normocephalic and atraumatic.  ?Eyes: Conjunctivae are normal. Pupils are equal, round, and reactive to light. No scleral icterus.  ?Neck: Normal range of motion. Neck supple. No tracheal deviation or thyromegaly present.  ?Cardiovascular: Normal rate, regular rhythm, normal heart sounds and intact distal pulses. Exam reveals  no gallop and no friction rub. No murmur heard. ?Breast: Ptotic bilaterally. Relatively symmetric. No nipple retraction or nipple discharge. No skin dimpling. No lymphadenopathy. On the left side, there is approximately a 1 to 1-1/2 cm mass at 3:00. This is near the edge of the areola. It is mobile. It appears to be separate from the skin. ?Respiratory: Effort normal. No respiratory distress. No chest wall tenderness. Breath sounds normal. No wheezes, rales or rhonchi.  ?GI: Soft. Bowel sounds are normal. The abdomen  is soft and nontender. There is no rebound and no guarding.  ?Musculoskeletal: Normal range of motion. Extremities are nontender.  ?Lymphadenopathy: No cervical, preauricular, postauricular or axillary adenopathy is present ?

## 2021-08-21 NOTE — Therapy (Signed)
?OUTPATIENT PHYSICAL THERAPY BREAST CANCER BASELINE EVALUATION ? ? ?Patient Name: Lisa [name] ?MRN: [id number] ?DOB:1961-04-04, 61 y.o., female ?Today's Date: 08/21/2021 ? ? PT End of Session - 08/21/21 1449   ? ? Visit Number 1   for cancer rehab  ? Number of Visits 2   for cancer rehab  ? Date for PT Re-Evaluation 09/18/21   for cancer rehab  ? PT Start Time 1405   ? PT Stop Time 6962   ? PT Time Calculation (min) 42 min   ? Activity Tolerance Patient tolerated treatment well   ? Behavior During Therapy [facility] for tasks assessed/performed   ? ?  ?  ? ?  ? ? ?Past Medical History:  ?Diagnosis Date  ? Anxiety   ? Arthritis   ? Breast cancer ([place]) 06/28/21  ? lump found  ? Breast cancer ([place])   ? left breast IMC  ? Breast mass, left 07/10/2021  ? Bulging disc L-5  ? Chicken pox   ? Depression   ? Diabetes mellitus   ? Hyperlipidemia   ? Hypertension   ? Insomnia   ? Major bone defects   ? 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 Meningitis 06/11/2021  ? bacterial meningitis  ? Polycystic ovarian disease   ? ?Past Surgical History:  ?Procedure Laterality Date  ? csf leak in skull  2005  ? noted failed attempt to close CSF leak in skull  ? [place]  ? miscarriage  ? IR FLUORO GUIDED NEEDLE PLC ASPIRATION/INJECTION LOC  06/12/2021  ? RADIOLOGY WITH ANESTHESIA N/A 06/12/2021  ? Procedure: IR WITH ANESTHESIA;  Surgeon: Radiologist, Medication, MD;  Location: [facility];  Service: Radiology;  Laterality: N/A;  ? [place]  ? ?Patient Active Problem List  ? Diagnosis Date Noted  ? Family history of breast cancer 08/14/2021  ? Malignant neoplasm of upper-outer quadrant of left breast in female, estrogen receptor positive ([place]) 08/12/2021  ? Breast mass, left 07/10/2021  ? Fever   ? Bacterial meningitis   ? Right sided weakness   ? Pressure injury of skin 06/13/2021  ? Sepsis due to Haemophilus influenzae with acute hypoxic respiratory failure and septic shock ([place])   ? Acute  bacterial meningitis 06/12/2021  ? Acute metabolic encephalopathy 95/28/4132  ? Altered mental status 06/12/2021  ? Obesity, morbid, BMI 40.0-49.9 ([place]) 03/24/2016  ? Family history of breast cancer in sister 10/21/2013  ? Tick bite of back 10/21/2012  ? Screening for malignant neoplasm of the cervix 05/19/2012  ? Routine gynecological examination 05/19/2012  ? Cough 03/05/2012  ? Tobacco use 12/30/2011  ? Diabetes mellitus, type 2 ([place]) 12/29/2011  ? HTN (hypertension) 12/29/2011  ? Hyperlipidemia 12/29/2011  ? CSF leak 12/29/2011  ? Psoriasis 12/29/2011  ? PCOS (polycystic ovarian syndrome) 12/29/2011  ? Depression with anxiety 12/29/2011  ? ? ?PCP: Bertell Maria, FNP ? ?REFERRING PROVIDER: Truitt Merle, MD ? ?REFERRING DIAG: C50.412,Z17.0 (ICD-10-CM) - Malignant neoplasm of upper-outer quadrant of left breast in female, estrogen receptor positive ([place])  ? ?THERAPY DIAG:  ?Abnormal posture ? ?Malignant neoplasm of upper-outer quadrant of left breast in female, estrogen receptor positive ([place]) ? ?ONSET DATE: 08/06/21 ? ?SUBJECTIVE                                                                                                                                                                                          ? ?  SUBJECTIVE STATEMENT: ?Patient reports she is here today to be seen by her medical team for her newly diagnosed left breast cancer.  ? ?PERTINENT HISTORY:  ?Patient was diagnosed on 08/06/21 with left grade 2. It measures 1.5 cm and is located in the upper outer quadrant. It is ER+/PR+/Her2- with a Ki67 of 5%. 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 ?PATIENT GOALS   reduce lymphedema risk and learn post op HEP.  ? ?PAIN:  ?Are you having pain? No Pt reports not currently but last night had pain in rotator cuff on R side.  ? ? ?PRECAUTIONS: Active CA Other: R rotator cuff tear , no lifting more than 5-10  lbs due to spontaneous spinal cord leak ? ?HAND DOMINANCE: right ? ?WEIGHT BEARING RESTRICTIONS No ? ?FALLS:  ?Has patient fallen in last 6 months? No, Number of falls: 0 ? ?LIVING ENVIRONMENT: ?Patient lives with: husband, daughter, son in law and 3 granddaughters ?Lives in: House/apartment ?Has following equipment at home: Quad cane small base, Environmental consultant - 2 wheeled, and [place] - 4 wheeled ? ?OCCUPATION: retired ? ?LEISURE: pt does not exercise ? ?PRIOR LEVEL OF FUNCTION: Independent with basic ADLs, Independent with household mobility with device, and Needs assistance with homemaking ? ? ?OBJECTIVE ? ?COGNITION: ? Overall cognitive status: Within functional limits for tasks assessed   ? ?POSTURE:  ?Forward head and rounded shoulders posture ? ?UPPER EXTREMITY AROM/PROM: ? ?A/PROM RIGHT  08/21/2021 ?  ?Shoulder extension 52  ?Shoulder flexion 110  ?Shoulder abduction 104  ?Shoulder internal rotation 30  ?Shoulder external rotation 56  ?  (Blank rows = not tested) ? ?A/PROM LEFT  08/21/2021  ?Shoulder extension 67  ?Shoulder flexion 163  ?Shoulder abduction 165  ?Shoulder internal rotation 56  ?Shoulder external rotation 76  ?  (Blank rows = not tested) ? ? ? ?UPPER EXTREMITY STRENGTH: not tested on R due to increased pain from rotator cuff tear ? ? ?LYMPHEDEMA ASSESSMENTS:  ? ?Gle[place] RIGHT  08/21/2021  ?10 cm proximal to olecranon process 30.2  ?Olecranon process 26  ?10 cm proximal to ulnar styloid process 21.3  ?Just proximal to ulnar styloid process 17  ?Across hand at thumb web space 19.6  ?At base of 2nd digit 6.4  ?(Blank rows = not tested) ? ?[place] LEFT  08/21/2021  ?10 cm proximal to olecranon process 29.7  ?Olecranon process 26.5  ?10 cm proximal to ulnar styloid process 21.5  ?Just proximal to ulnar styloid process 17.1  ?Across hand at thumb web space 20.2  ?At base of 2nd digit 6.4  ?(Blank rows = not tested) ? ? ?L-DEX LYMPHEDEMA SCREENING: ? ?The patient was assessed using the L-Dex machine today to  produce a lymphedema index baseline score. The patient will be reassessed on a regular basis (typically every 3 months) to obtain new L-Dex scores. If the score is > 6.5 points away from his/her baseline score indicating onset of subclinical lymphedema, it will be recommended to wear a compression garment for 4 weeks, 12 hours per day and then be reassessed. If the score continues to be > 6.5 points from baseline at reassessment, we will initiate lymphedema treatment. Assessing in this manner has a 95% rate of preventing clinically significant lymphedema. ? ? L-DEX FLOWSHEETS - 08/21/21 1400   ? ?  ? L-DEX LYMPHEDEMA SCREENING  ? Measurement Type Unilateral   ? L-DEX MEASUREMENT EXTREMITY Upper Extremity   ?  POSITION  Standing   ? DOMINANT SIDE Right   ? At Risk Side Left   ? BASELINE SCORE (UNILATERAL) 5.6   ? ?  ?  ? ?  ? ? ? ?QUICK DASH SURVEY:  ? Katina Dung - 08/21/21 0001   ? ? Open a tight or new jar Unable   ? Do heavy household chores (wash walls, wash floors) Unable   ? Carry a shopping bag or briefcase Severe difficulty   ? Wash your back Moderate difficulty   ? Use a knife to cut food No difficulty   ? Recreational activities in which you take some force or impact through your arm, shoulder, or hand (golf, hammering, tennis) Unable   ? During the past week, to what extent has your arm, shoulder or hand problem interfered with your normal social activities with family, friends, neighbors, or groups? Extremely   ? During the past week, to what extent has your arm, shoulder or hand problem limited your work or other regular daily activities Quite a bit   ? Arm, shoulder, or hand pain. Severe   ? Tingling (pins and needles) in your arm, shoulder, or hand Severe   ? Difficulty Sleeping Severe difficulty   ? DASH Score 75 %   ? ?  ?  ? ?  ? ? ? ? ?PATIENT EDUCATION:  ?Education details: Lymphedema risk reduction and post op shoulder/posture HEP ?Person educated: Patient ?Education method: Explanation,  Demonstration, Handout ?Education comprehension: Patient verbalized understanding and returned demonstration ? ? ?HOME EXERCISE PROGRAM: ?Patient was instructed today in a home exercise program today for post op s

## 2021-08-21 NOTE — Telephone Encounter (Signed)
[employer] 2021-05 - Specimen Collection Study to Evaluate Biomarkers in Subjects with Cancer  ? ?Called patient to f/u on potential interest in study, no answer.  Contacted spouse who states he will have patient call back today to discuss interest in study. ? ?Wells Guiles 'Learta Codding' Danell Vazquez, RN, BSN ?Clinical Research Nurse I ?08/21/21 ?12:22 PM ? ?

## 2021-08-21 NOTE — Progress Notes (Signed)

## 2021-08-22 ENCOUNTER — Telehealth: Payer: Self-pay | Admitting: Genetic Counselor

## 2021-08-22 ENCOUNTER — Encounter: Payer: Self-pay | Admitting: *Deleted

## 2021-08-22 ENCOUNTER — Encounter: Payer: Self-pay | Admitting: Genetic Counselor

## 2021-08-22 ENCOUNTER — Telehealth: Payer: Self-pay | Admitting: *Deleted

## 2021-08-22 ENCOUNTER — Telehealth: Payer: Self-pay | Admitting: Emergency Medicine

## 2021-08-22 ENCOUNTER — Other Ambulatory Visit (HOSPITAL_BASED_OUTPATIENT_CLINIC_OR_DEPARTMENT_OTHER): Payer: Self-pay

## 2021-08-22 NOTE — Telephone Encounter (Signed)
Spoke with patient to follow up from [facility] 3/8 and assess navigation needs. She denies any questions or concerns at this time.  Encouraged her to call should anything arise. Patient verbalized understanding.  ?

## 2021-08-22 NOTE — Telephone Encounter (Signed)
I contacted Ms. Deerman to discuss her genetic testing results. No pathogenic variants were identified in the first 8 genes analyzed. Of note, we are still waiting on additional genes to be analyzed and will call her when they are available. Detailed clinic note to follow. ? ?The test report has been scanned into EPIC and is located under the Molecular Pathology section of the Results Review tab.  A portion of the result report is included below for reference.  ? ?Lucille Passy, MS, [place] ?Genetic Counselor ?[email or web address] ?(P) [phone] ? ? ?

## 2021-08-22 NOTE — Telephone Encounter (Signed)
[employer] 2021-05 - Specimen Collection Study to Evaluate Biomarkers in Subjects with Cancer  ? ?Called pt to f/u on potential interest in study, no answer.  Left VM with contact information and request to f/u if patient is interested in study. ? ?Wells Guiles 'Learta Codding' Chestina Komatsu, RN, BSN ?Clinical Research Nurse I ?08/22/21 ?10:03 AM ? ?

## 2021-08-22 NOTE — Anesthesia Preprocedure Evaluation (Addendum)
Anesthesia Evaluation  ?Patient identified by MRN, date of birth, ID band ?Patient awake ? ? ? ?Reviewed: ?Allergy & Precautions, NPO status , Patient's Chart, lab work & pertinent test results ? ?Airway ?Mallampati: II ? ?TM Distance: >3 FB ?Neck ROM: Full ? ? ? Dental ? ?(+) Teeth Intact, Dental Advisory Given ?  ?Pulmonary ?former smoker,  ?  ?Pulmonary exam normal ?breath sounds clear to auscultation ? ? ? ? ? ? Cardiovascular ?hypertension, Pt. on medications ?Normal cardiovascular exam ?Rhythm:Regular Rate:Normal ? ? ?  ?Neuro/Psych ?PSYCHIATRIC DISORDERS Anxiety Depression negative neurological ROS ?   ? GI/Hepatic ?negative GI ROS, Neg liver ROS,   ?Endo/Other  ?diabetes, Type 2, Insulin Dependent, Oral Hypoglycemic AgentsMorbid obesity ? Renal/GU ?negative Renal ROS  ? ?  ?Musculoskeletal ? ?(+) Arthritis ,  ? Abdominal ?  ?Peds ? Hematology ?negative hematology ROS ?(+)   ?Anesthesia Other Findings ?Left breast cancer ? ? Reproductive/Obstetrics ? ?  ? ? ? ? ? ? ? ? ? ? ? ? ? ?  ?  ? ? ? ? ? ? ? ?Anesthesia Physical ?Anesthesia Plan ? ?ASA: 3 ? ?Anesthesia Plan: General  ? ?Post-op Pain Management: Regional block* and Tylenol PO (pre-op)*  ? ?Induction: Intravenous ? ?PONV Risk Score and Plan: 3 and Dexamethasone, Ondansetron and Midazolam ? ?Airway Management Planned: LMA ? ?Additional Equipment:  ? ?Intra-op Plan:  ? ?Post-operative Plan: Extubation in OR ? ?Informed Consent: I have reviewed the patients History and Physical, chart, labs and discussed the procedure including the risks, benefits and alternatives for the proposed anesthesia with the patient or authorized representative who has indicated his/her understanding and acceptance.  ? ? ? ?Dental advisory given ? ?Plan Discussed with: CRNA ? ?Anesthesia Plan Comments:   ? ? ? ? ? ?Anesthesia Quick Evaluation ? ?

## 2021-08-22 NOTE — Telephone Encounter (Signed)
I attempted to contact Ms. Kyllo to discuss her genetic testing results (8 genes). I left a voicemail requesting she call me back at [phone]. ? ?Lucille Passy, MS, LCGC ?Genetic Counselor ?[email or web address] ?(P) [phone] ? ?

## 2021-08-23 ENCOUNTER — Encounter (HOSPITAL_BASED_OUTPATIENT_CLINIC_OR_DEPARTMENT_OTHER)
Admission: RE | Disposition: A | Discharge status: Home / Self Care | Payer: Self-pay | Source: Home / Self Care | Attending: General Surgery

## 2021-08-23 ENCOUNTER — Other Ambulatory Visit: Payer: Self-pay

## 2021-08-23 ENCOUNTER — Ambulatory Visit (HOSPITAL_BASED_OUTPATIENT_CLINIC_OR_DEPARTMENT_OTHER)
Admission: RE | Admit: 2021-08-23 | Discharge: 2021-08-23 | Disposition: A | Discharge status: Home / Self Care | Payer: Commercial Managed Care - HMO | Attending: General Surgery | Admitting: General Surgery

## 2021-08-23 ENCOUNTER — Other Ambulatory Visit (HOSPITAL_BASED_OUTPATIENT_CLINIC_OR_DEPARTMENT_OTHER): Payer: Self-pay

## 2021-08-23 ENCOUNTER — Ambulatory Visit (HOSPITAL_BASED_OUTPATIENT_CLINIC_OR_DEPARTMENT_OTHER): Payer: Commercial Managed Care - HMO | Admitting: Certified Registered"

## 2021-08-23 ENCOUNTER — Encounter (HOSPITAL_BASED_OUTPATIENT_CLINIC_OR_DEPARTMENT_OTHER): Payer: Self-pay | Admitting: General Surgery

## 2021-08-23 DIAGNOSIS — Z803 Family history of malignant neoplasm of breast: Secondary | ICD-10-CM | POA: Insufficient documentation

## 2021-08-23 DIAGNOSIS — F32A Depression, unspecified: Secondary | ICD-10-CM | POA: Insufficient documentation

## 2021-08-23 DIAGNOSIS — E119 Type 2 diabetes mellitus without complications: Secondary | ICD-10-CM | POA: Insufficient documentation

## 2021-08-23 DIAGNOSIS — C773 Secondary and unspecified malignant neoplasm of axilla and upper limb lymph nodes: Secondary | ICD-10-CM | POA: Diagnosis not present

## 2021-08-23 DIAGNOSIS — C50412 Malignant neoplasm of upper-outer quadrant of left female breast: Secondary | ICD-10-CM

## 2021-08-23 DIAGNOSIS — I10 Essential (primary) hypertension: Secondary | ICD-10-CM

## 2021-08-23 DIAGNOSIS — Z8249 Family history of ischemic heart disease and other diseases of the circulatory system: Secondary | ICD-10-CM | POA: Insufficient documentation

## 2021-08-23 DIAGNOSIS — M1990 Unspecified osteoarthritis, unspecified site: Secondary | ICD-10-CM | POA: Diagnosis not present

## 2021-08-23 DIAGNOSIS — Z170 Estrogen receptor positive status [ER+]: Secondary | ICD-10-CM

## 2021-08-23 DIAGNOSIS — Z6841 Body Mass Index (BMI) 40.0 and over, adult: Secondary | ICD-10-CM | POA: Diagnosis not present

## 2021-08-23 DIAGNOSIS — Z794 Long term (current) use of insulin: Secondary | ICD-10-CM | POA: Insufficient documentation

## 2021-08-23 DIAGNOSIS — F419 Anxiety disorder, unspecified: Secondary | ICD-10-CM | POA: Insufficient documentation

## 2021-08-23 DIAGNOSIS — Z833 Family history of diabetes mellitus: Secondary | ICD-10-CM | POA: Insufficient documentation

## 2021-08-23 DIAGNOSIS — Z79899 Other long term (current) drug therapy: Secondary | ICD-10-CM | POA: Insufficient documentation

## 2021-08-23 HISTORY — PX: BREAST LUMPECTOMY WITH SENTINEL LYMPH NODE BIOPSY: SHX5597

## 2021-08-23 LAB — GLUCOSE, CAPILLARY
Glucose-Capillary: 128 mg/dL — ABNORMAL HIGH (ref 70–99)
Glucose-Capillary: 143 mg/dL — ABNORMAL HIGH (ref 70–99)

## 2021-08-23 SURGERY — BREAST LUMPECTOMY WITH SENTINEL LYMPH NODE BX
Anesthesia: General | Site: Breast | Laterality: Left

## 2021-08-23 MED ORDER — ACETAMINOPHEN 500 MG PO TABS
ORAL_TABLET | ORAL | Status: AC
  Filled 2021-08-23: qty 2

## 2021-08-23 MED ORDER — PROPOFOL 500 MG/50ML IV EMUL
INTRAVENOUS | Status: DC | PRN
  Administered 2021-08-23: 25 ug/kg/min via INTRAVENOUS

## 2021-08-23 MED ORDER — FENTANYL CITRATE (PF) 100 MCG/2ML IJ SOLN: 25.0000 ug | INTRAMUSCULAR | Status: DC | PRN

## 2021-08-23 MED ORDER — LACTATED RINGERS IV SOLN: INTRAVENOUS | Status: DC

## 2021-08-23 MED ORDER — FENTANYL CITRATE (PF) 100 MCG/2ML IJ SOLN
INTRAMUSCULAR | Status: DC | PRN
Start: 2021-08-23 — End: 2021-08-23
  Administered 2021-08-23 (×2): 25 ug via INTRAVENOUS
  Administered 2021-08-23: 50 ug via INTRAVENOUS

## 2021-08-23 MED ORDER — CEFAZOLIN SODIUM-DEXTROSE 2-4 GM/100ML-% IV SOLN
INTRAVENOUS | Status: AC
  Filled 2021-08-23: qty 100

## 2021-08-23 MED ORDER — FENTANYL CITRATE (PF) 100 MCG/2ML IJ SOLN
INTRAMUSCULAR | Status: AC
  Filled 2021-08-23: qty 2

## 2021-08-23 MED ORDER — 0.9 % SODIUM CHLORIDE (POUR BTL) OPTIME
TOPICAL | Status: DC | PRN
  Administered 2021-08-23: 400 mL

## 2021-08-23 MED ORDER — ACETAMINOPHEN 500 MG PO TABS
1000.0000 mg | ORAL_TABLET | ORAL | Status: AC
  Administered 2021-08-23: 1000 mg via ORAL

## 2021-08-23 MED ORDER — OXYCODONE HCL 5 MG PO TABS
5.0000 mg | ORAL_TABLET | Freq: Four times a day (QID) | ORAL | 0 refills | Status: DC | PRN
  Filled 2021-08-23: qty 8, 2d supply, fill #0

## 2021-08-23 MED ORDER — PROPOFOL 10 MG/ML IV BOLUS
INTRAVENOUS | Status: AC
  Filled 2021-08-23: qty 20

## 2021-08-23 MED ORDER — LIDOCAINE-EPINEPHRINE (PF) 1 %-1:200000 IJ SOLN
INTRAMUSCULAR | Status: DC | PRN
  Administered 2021-08-23: 15 mL

## 2021-08-23 MED ORDER — BUPIVACAINE-EPINEPHRINE (PF) 0.5% -1:200000 IJ SOLN
INTRAMUSCULAR | Status: DC | PRN
  Administered 2021-08-23: 30 mL via PERINEURAL

## 2021-08-23 MED ORDER — MIDAZOLAM HCL 2 MG/2ML IJ SOLN
INTRAMUSCULAR | Status: AC
  Filled 2021-08-23: qty 2

## 2021-08-23 MED ORDER — LIDOCAINE-EPINEPHRINE (PF) 1 %-1:200000 IJ SOLN
INTRAMUSCULAR | Status: AC
  Filled 2021-08-23: qty 120

## 2021-08-23 MED ORDER — PHENYLEPHRINE HCL (PRESSORS) 10 MG/ML IV SOLN
INTRAVENOUS | Status: DC | PRN
  Administered 2021-08-23 (×2): 80 ug via INTRAVENOUS

## 2021-08-23 MED ORDER — ONDANSETRON HCL 4 MG PO TABS
4.0000 mg | ORAL_TABLET | Freq: Three times a day (TID) | ORAL | 0 refills | Status: AC | PRN
  Filled 2021-08-23: qty 20, 7d supply, fill #0

## 2021-08-23 MED ORDER — ONDANSETRON HCL 4 MG/2ML IJ SOLN
INTRAMUSCULAR | Status: DC | PRN
  Administered 2021-08-23: 4 mg via INTRAVENOUS

## 2021-08-23 MED ORDER — PROPOFOL 10 MG/ML IV BOLUS
INTRAVENOUS | Status: DC | PRN
  Administered 2021-08-23: 150 mg via INTRAVENOUS

## 2021-08-23 MED ORDER — PROPOFOL 500 MG/50ML IV EMUL
INTRAVENOUS | Status: AC
  Filled 2021-08-23: qty 50

## 2021-08-23 MED ORDER — CEFAZOLIN SODIUM-DEXTROSE 2-4 GM/100ML-% IV SOLN
2.0000 g | INTRAVENOUS | Status: AC
  Administered 2021-08-23: 2 g via INTRAVENOUS

## 2021-08-23 MED ORDER — FENTANYL CITRATE (PF) 100 MCG/2ML IJ SOLN
100.0000 ug | Freq: Once | INTRAMUSCULAR | Status: AC
  Administered 2021-08-23: 50 ug via INTRAVENOUS

## 2021-08-23 MED ORDER — ONDANSETRON HCL 4 MG/2ML IJ SOLN: 4.0000 mg | Freq: Once | INTRAMUSCULAR | Status: DC | PRN

## 2021-08-23 MED ORDER — BUPIVACAINE HCL (PF) 0.5 % IJ SOLN
INTRAMUSCULAR | Status: AC
  Filled 2021-08-23: qty 120

## 2021-08-23 MED ORDER — MAGTRACE LYMPHATIC TRACER
INTRAMUSCULAR | Status: DC | PRN
  Administered 2021-08-23: 2 mL via INTRAMUSCULAR

## 2021-08-23 MED ORDER — MIDAZOLAM HCL 2 MG/2ML IJ SOLN
2.0000 mg | Freq: Once | INTRAMUSCULAR | Status: AC
  Administered 2021-08-23: 2 mg via INTRAVENOUS

## 2021-08-23 MED ORDER — DEXAMETHASONE SODIUM PHOSPHATE 10 MG/ML IJ SOLN
INTRAMUSCULAR | Status: DC | PRN
  Administered 2021-08-23: 10 mg
  Administered 2021-08-23: 4 mg

## 2021-08-23 MED ORDER — LIDOCAINE HCL (CARDIAC) PF 100 MG/5ML IV SOSY
PREFILLED_SYRINGE | INTRAVENOUS | Status: DC | PRN
Start: 2021-08-23 — End: 2021-08-23
  Administered 2021-08-23: 60 mg via INTRAVENOUS

## 2021-08-23 SURGICAL SUPPLY — 63 items
ADH SKN CLS APL DERMABOND .7 (GAUZE/BANDAGES/DRESSINGS) ×1
APL PRP STRL LF DISP 70% ISPRP (MISCELLANEOUS) ×1
BINDER BREAST 3XL (GAUZE/BANDAGES/DRESSINGS) ×1 IMPLANT
BINDER BREAST LRG (GAUZE/BANDAGES/DRESSINGS) IMPLANT
BINDER BREAST MEDIUM (GAUZE/BANDAGES/DRESSINGS) IMPLANT
BINDER BREAST XLRG (GAUZE/BANDAGES/DRESSINGS) IMPLANT
BINDER BREAST XXLRG (GAUZE/BANDAGES/DRESSINGS) IMPLANT
BLADE CLIPPER SURG (BLADE) ×1 IMPLANT
BLADE SURG 10 STRL SS (BLADE) ×2 IMPLANT
BLADE SURG 15 STRL LF DISP TIS (BLADE) ×1 IMPLANT
BLADE SURG 15 STRL SS (BLADE) ×2
BNDG GAUZE ELAST 4 BULKY (GAUZE/BANDAGES/DRESSINGS) ×1 IMPLANT
CANISTER SUCT 1200ML W/VALVE (MISCELLANEOUS) ×2 IMPLANT
CHLORAPREP W/TINT 26 (MISCELLANEOUS) ×2 IMPLANT
CLIP TI LARGE 6 (CLIP) ×2 IMPLANT
CLIP TI MEDIUM 6 (CLIP) ×4 IMPLANT
CLIP TI WIDE RED SMALL 6 (CLIP) IMPLANT
COVER MAYO STAND STRL (DRAPES) ×2 IMPLANT
COVER PROBE W GEL 5X96 (DRAPES) ×2 IMPLANT
DERMABOND ADVANCED (GAUZE/BANDAGES/DRESSINGS) ×1
DERMABOND ADVANCED .7 DNX12 (GAUZE/BANDAGES/DRESSINGS) ×1 IMPLANT
DRAPE UTILITY XL STRL (DRAPES) ×2 IMPLANT
DRSG PAD ABDOMINAL 8X10 ST (GAUZE/BANDAGES/DRESSINGS) ×2 IMPLANT
ELECT COATED BLADE 2.86 ST (ELECTRODE) ×2 IMPLANT
ELECT REM PT RETURN 9FT ADLT (ELECTROSURGICAL) ×2
ELECTRODE REM PT RTRN 9FT ADLT (ELECTROSURGICAL) ×1 IMPLANT
GAUZE SPONGE 4X4 12PLY STRL (GAUZE/BANDAGES/DRESSINGS) ×4 IMPLANT
GLOVE SURG ENC MOIS LTX SZ6 (GLOVE) ×2 IMPLANT
GLOVE SURG POLYISO LF SZ6.5 (GLOVE) ×1 IMPLANT
GLOVE SURG UNDER POLY LF SZ6.5 (GLOVE) ×2 IMPLANT
GLOVE SURG UNDER POLY LF SZ7 (GLOVE) ×1 IMPLANT
GOWN STRL REUS W/ TWL LRG LVL3 (GOWN DISPOSABLE) ×1 IMPLANT
GOWN STRL REUS W/TWL 2XL LVL3 (GOWN DISPOSABLE) ×2 IMPLANT
GOWN STRL REUS W/TWL LRG LVL3 (GOWN DISPOSABLE) ×2
KIT MARKER MARGIN INK (KITS) ×1 IMPLANT
LIGHT WAVEGUIDE WIDE FLAT (MISCELLANEOUS) ×1 IMPLANT
NDL HYPO 25X1 1.5 SAFETY (NEEDLE) ×2 IMPLANT
NDL SAFETY ECLIPSE 18X1.5 (NEEDLE) ×1 IMPLANT
NEEDLE HYPO 18GX1.5 SHARP (NEEDLE)
NEEDLE HYPO 25X1 1.5 SAFETY (NEEDLE) ×4 IMPLANT
NS IRRIG 1000ML POUR BTL (IV SOLUTION) ×2 IMPLANT
PACK BASIN DAY SURGERY FS (CUSTOM PROCEDURE TRAY) ×2 IMPLANT
PACK UNIVERSAL I (CUSTOM PROCEDURE TRAY) ×2 IMPLANT
PENCIL SMOKE EVACUATOR (MISCELLANEOUS) ×2 IMPLANT
SLEEVE SCD COMPRESS KNEE MED (STOCKING) ×1 IMPLANT
SPIKE FLUID TRANSFER (MISCELLANEOUS) IMPLANT
SPONGE T-LAP 18X18 ~~LOC~~+RFID (SPONGE) ×2 IMPLANT
SPONGE T-LAP 4X18 ~~LOC~~+RFID (SPONGE) IMPLANT
STOCKINETTE IMPERVIOUS LG (DRAPES) ×2 IMPLANT
STRIP CLOSURE SKIN 1/2X4 (GAUZE/BANDAGES/DRESSINGS) ×2 IMPLANT
SUT MON AB 4-0 PC3 18 (SUTURE) ×2 IMPLANT
SUT SILK 2 0 SH (SUTURE) IMPLANT
SUT VIC AB 2-0 SH 27 (SUTURE)
SUT VIC AB 2-0 SH 27XBRD (SUTURE) IMPLANT
SUT VIC AB 3-0 SH 27 (SUTURE) ×4
SUT VIC AB 3-0 SH 27X BRD (SUTURE) ×2 IMPLANT
SYR BULB EAR ULCER 3OZ GRN STR (SYRINGE) IMPLANT
SYR CONTROL 10ML LL (SYRINGE) ×3 IMPLANT
TOWEL GREEN STERILE FF (TOWEL DISPOSABLE) ×2 IMPLANT
TRACER MAGTRACE VIAL (MISCELLANEOUS) ×1 IMPLANT
TRAY FAXITRON CT DISP (TRAY / TRAY PROCEDURE) IMPLANT
TUBE CONNECTING 20X1/4 (TUBING) ×2 IMPLANT
YANKAUER SUCT BULB TIP NO VENT (SUCTIONS) ×2 IMPLANT

## 2021-08-23 NOTE — Interval H&P Note (Signed)
History and Physical Interval Note: ? ?08/23/2021 ?7:34 AM ? ?Lisa [name]  has presented today for surgery, with the diagnosis of LEFT BREAST CANCER.  The various methods of treatment have been discussed with the patient and family. After consideration of risks, benefits and other options for treatment, the patient has consented to  Procedure(s): ?LEFT BREAST LUMPECTOMY WITH SENTINEL LYMPH NODE BX (Left) as a surgical intervention.  The patient's history has been reviewed, patient examined, no change in status, stable for surgery.  I have reviewed the patient's chart and labs.  Questions were answered to the patient's satisfaction.   ? ? ?Stark Klein ? ? ?

## 2021-08-23 NOTE — Anesthesia Procedure Notes (Signed)
Procedure Name: LMA Insertion ?Date/Time: 08/23/2021 7:49 AM ?Performed by: Signe Colt, CRNA ?Pre-anesthesia Checklist: Patient identified, Emergency Drugs available, Suction available and Patient being monitored ?Patient Re-evaluated:Patient Re-evaluated prior to induction ?Oxygen Delivery Method: Circle System Utilized ?Preoxygenation: Pre-oxygenation with 100% oxygen ?Induction Type: IV induction ?Ventilation: Mask ventilation without difficulty ?LMA: LMA inserted ?LMA Size: 4.0 ?Number of attempts: 1 ?Airway Equipment and Method: bite block ?Placement Confirmation: positive ETCO2 ?Tube secured with: Tape ?Dental Injury: Teeth and Oropharynx as per pre-operative assessment  ? ? ? ? ?

## 2021-08-23 NOTE — Anesthesia Postprocedure Evaluation (Signed)
Anesthesia Post Note ? ?Patient: Lisa [name] ? ?Procedure(s) Performed: LEFT BREAST LUMPECTOMY WITH SENTINEL LYMPH NODE BIOPSY (Left: Breast) ? ?  ? ?Patient location during evaluation: PACU ?Anesthesia Type: General ?Level of consciousness: awake and alert ?Pain management: pain level controlled ?Vital Signs Assessment: post-procedure vital signs reviewed and stable ?Respiratory status: spontaneous breathing, nonlabored ventilation, respiratory function stable and patient connected to nasal cannula oxygen ?Cardiovascular status: blood pressure returned to baseline and stable ?Postop Assessment: no apparent nausea or vomiting ?Anesthetic complications: no ? ? ?No notable events documented. ? ?Last Vitals:  ?Vitals:  ? 08/23/21 1015 08/23/21 1029  ?BP: 129/81 119/76  ?Pulse: 89 91  ?Resp: 18 16  ?Temp:  36.7 ?C  ?SpO2: 93% 94%  ?  ?Last Pain:  ?Vitals:  ? 08/23/21 1029  ?TempSrc:   ?PainSc: 0-No pain  ? ? ?  ?  ?  ?  ?  ?  ? ?[place] L[place] ? ? ? ? ?

## 2021-08-23 NOTE — Transfer of Care (Signed)
Immediate Anesthesia Transfer of Care Note ? ?Patient: Lisa [name] ? ?Procedure(s) Performed: LEFT BREAST LUMPECTOMY WITH SENTINEL LYMPH NODE BIOPSY (Left: Breast) ? ?Patient Location: PACU ? ?Anesthesia Type:GA combined with regional for post-op pain ? ?Level of Consciousness: drowsy and patient cooperative ? ?Airway & Oxygen Therapy: Patient Spontanous Breathing and Patient connected to face mask oxygen ? ?Post-op Assessment: Report given to RN and Post -op Vital signs reviewed and stable ? ?Post vital signs: Reviewed and stable ? ?Last Vitals:  ?Vitals Value Taken Time  ?BP 101/67 08/23/21 0935  ?Temp    ?Pulse 95 08/23/21 0936  ?Resp 14 08/23/21 0936  ?SpO2 92 % 08/23/21 0936  ?Vitals shown include unvalidated device data. ? ?Last Pain:  ?Vitals:  ? 08/23/21 0648  ?TempSrc: Oral  ?PainSc: 5   ?   ? ?Patients Stated Pain Goal: 3 (08/23/21 3419) ? ?Complications: No notable events documented. ?

## 2021-08-23 NOTE — Op Note (Signed)
Left Breast Lumpectomy with Sentinel Node Mapping and Biopsy Procedure Note ? ?Indications: This patient presents with history of left breast cancer with clinically negative axillary lymph node exam. ? 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 ?Post-operative Diagnosis: left breast cancer ? ?Surgeon: Stark Klein  ? ?Anesthesia: General LMA anesthesia and and pectoral block ? ?ASA Class: 3 ? ?Procedure Details  ?The patient was seen in the Holding Room. The risks, benefits, complications, treatment options, and expected outcomes were discussed with the patient. The possibilities of reaction to medication, pulmonary aspiration, bleeding, infection, the need for additional procedures, failure to diagnose a condition, and creating a complication requiring transfusion or operation were discussed with the patient. The patient concurred with the proposed plan, giving informed consent.  The site of surgery properly noted/marked. The patient was taken to Operating Room # 8, identified as Lisa [name] and the procedure verified as left Breast Lumpectomy and Sentinel Node Biopsy. After induction of anesthesia, the left arm, breast, and chest were prepped and draped in standard fashion.  A Time Out was held and the above information confirmed. The MagTrace was injected into the subareolar location and massaged for 5 minutes.  ? ?The lumpectomy was performed by creating a lateral circumareolar incision near the mass.  Skin hooks were used to elevate the skin and the cautery was used to dissect around the mass.  The specimen was marked with the margin marker paint kit.  The specimen was placed in the faxatron to confirm presence on the biopsy clip.  Hemostasis was achieved with cautery.  Large clips were placed on the specimen cavity edges for radiation.   The wound was irrigated and closed with a 3-0 Vicryl  deep dermal interrupted and a 4-0 Monocryl subcuticular closure in layers.  ? ?Using the sentimag probe, axillary sentinel nodes were identified transcutaneously.  An oblique incision was created below the axillary hairline.  Dissection was carried through the clavipectoral fascia.  5 deep level 2 axillary sentinel nodes were removed. Lymphovascular channels were clipped.  The wound was irrigated.  Hemostasis was achieved with cautery and clips.  The axillary incision was closed with 3-0 vicryl deep dermal interrupted sutures and 4-0 monocryl subcuticular closure in layers. ?     ?Sterile dressings were applied. At the end of the operation, all sponge, instrument, and needle counts were correct. ? ?Findings: ?grossly clear surgical margins and SLN 1-5 high signal with Mag Trace.  #3 was the highest at >5000. Background was <50 ? ?Estimated Blood Loss:  Minimal ?        ?Specimens: Left breast lumpectomy, SLN 1-5 ?               ?Complications:  None; patient tolerated the procedure well. ?        ?Disposition: PACU - hemodynamically stable. ?        ?Condition: stable ? ?   ? ?

## 2021-08-23 NOTE — Anesthesia Procedure Notes (Signed)
Anesthesia Regional Block: Pectoralis block  ? ?Pre-Anesthetic Checklist: , timeout performed,  Correct Patient, Correct Site, Correct Laterality,  Correct Procedure, Correct Position, site marked,  Risks and benefits discussed,  Surgical consent,  Pre-op evaluation,  At surgeon's request and post-op pain management ? ?Laterality: Left ? ?Prep: chloraprep     ?  ?Needles:  ?Injection technique: Single-shot ? ?Needle Type: Echogenic Needle   ? ? ?Needle Length: 9cm  ?Needle Gauge: 21  ? ? ? ?Additional Needles: ? ? ?Procedures:,,,, ultrasound used (permanent image in chart),,    ?Narrative:  ?Start time: 08/23/2021 7:07 AM ?End time: 08/23/2021 7:13 AM ?Injection made incrementally with aspirations every 5 mL. ? ?Performed by: Personally  ?Anesthesiologist: Santa Lighter, MD ? ?Additional Notes: ?No pain on injection. No increased resistance to injection. Injection made in 5cc increments.  Good needle visualization.  Patient tolerated procedure well. ? ? ? ? ?

## 2021-08-23 NOTE — Discharge Instructions (Addendum)
[facility] ?Office Phone Number [phone] ? ?BREAST BIOPSY/ PARTIAL MASTECTOMY: POST OP INSTRUCTIONS ? ?Always review your discharge instruction sheet given to you by the facility where your surgery was performed. ? ?IF YOU HAVE DISABILITY OR FAMILY LEAVE FORMS, YOU MUST BRING THEM TO THE OFFICE FOR PROCESSING.  DO NOT GIVE THEM TO YOUR DOCTOR. ? ?Take 2 tylenol (acetominophen) three times a day for 3 days.  If you still have pain, add ibuprofen with food in between if able to take this (if you have kidney issues or stomach issues, do not take ibuprofen).  If both of those are not enough, add the narcotic pain pill.  If you find you are needing a lot of this overnight after surgery, call the next morning for a refill.   ? ?Last dose of Tylenol received at 7am on 08/23/2021.  ? ? Prescriptions will not be filled after 5pm or on week-ends. ?Take your usually prescribed medications unless otherwise directed ?You should eat very light the first 24 hours after surgery, such as soup, crackers, pudding, etc.  Resume your normal diet the day after surgery. ?Most patients will experience some swelling and bruising in the breast.  Ice packs and a good support bra will help.  Swelling and bruising can take several days to resolve.  ?It is common to experience some constipation if taking pain medication after surgery.  Increasing fluid intake and taking a stool softener will usually help or prevent this problem from occurring.  A mild laxative (Milk of Magnesia or Miralax) should be taken according to package directions if there are no bowel movements after 48 hours. ?Unless discharge instructions indicate otherwise, you may remove your bandages 48 hours after surgery, and you may shower at that time.  You may have steri-strips (small skin tapes) in place directly over the incision.  These strips should be left on the skin at least for for 7-10 days.    ?ACTIVITIES:  You may resume regular daily activities  (gradually increasing) beginning the next day.  Wearing a good support bra or sports bra (or the breast binder) minimizes pain and swelling.  You may have sexual intercourse when it is comfortable. ?No heavy lifting for 1-2 weeks (not over around 10 pounds).  ?You may drive when you no longer are taking prescription pain medication, you can comfortably wear a seatbelt, and you can safely maneuver your car and apply brakes. ?RETURN TO WORK:  __________3-14 days depending on job. _______________ ?You should see your doctor in the office for a follow-up appointment approximately two weeks after your surgery.  Your doctor?s nurse will typically make your follow-up appointment when she calls you with your pathology report.  Expect your pathology report 3-4 business days after your surgery.  You may call to check if you do not hear from [place] after three days. ? ? ?WHEN TO CALL YOUR DOCTOR: ?Fever over 101.0 ?Nausea and/or vomiting. ?Extreme swelling or bruising. ?Continued bleeding from incision. ?Increased pain, redness, or drainage from the incision. ? ?The clinic staff is available to answer your questions during regular business hours.  Please don?t hesitate to call and ask to speak to one of the nurses for clinical concerns.  If you have a medical emergency, go to the nearest emergency room or call 911.  A surgeon from [facility] is always on call at the hospital. ? ?For further questions, please visit [email or web address]  ? ?Regional Anesthesia Blocks ? ?1. Numbness or the inability to move  the "blocked" extremity may last from 3-48 hours after placement. The length of time depends on the medication injected and your individual response to the medication. If the numbness is not going away after 48 hours, call your surgeon. ? ?2. The extremity that is blocked will need to be protected until the numbness is gone and the  Strength has returned. Because you cannot feel it, you will need to take extra  care to avoid injury. Because it may be weak, you may have difficulty moving it or using it. You may not know what position it is in without looking at it while the block is in effect. ? ?3. For blocks in the legs and feet, returning to weight bearing and walking needs to be done carefully. You will need to wait until the numbness is entirely gone and the strength has returned. You should be able to move your leg and foot normally before you try and bear weight or walk. You will need someone to be with you when you first try to ensure you do not fall and possibly risk injury. ? ?4. Bruising and tenderness at the needle site are common side effects and will resolve in a few days. ? ?5. Persistent numbness or new problems with movement should be communicated to the surgeon or the [facility] [place] [phone] [place] [phone]).  ? ? ?Post Anesthesia Home Care Instructions ? ?Activity: ?Get plenty of rest for the remainder of the day. A responsible individual must stay with you for 24 hours following the procedure.  ?For the next 24 hours, DO NOT: ?-Drive a car ?-Paediatric nurse ?-Drink alcoholic beverages ?-Take any medication unless instructed by your physician ?-Make any legal decisions or sign important papers. ? ?Meals: ?Start with liquid foods such as gelatin or soup. Progress to regular foods as tolerated. Avoid greasy, spicy, heavy foods. If nausea and/or vomiting occur, drink only clear liquids until the nausea and/or vomiting subsides. Call your physician if vomiting continues. ? ?Special Instructions/Symptoms: ?Your throat may feel dry or sore from the anesthesia or the breathing tube placed in your throat during surgery. If this causes discomfort, gargle with warm salt water. The discomfort should disappear within 24 hours. ? ?If you had a scopolamine patch placed behind your ear for the management of post- operative nausea and/or vomiting: ? ?1. The medication in the patch  is effective for 72 hours, after which it should be removed.  Wrap patch in a tissue and discard in the trash. Wash hands thoroughly with soap and water. ?2. You may remove the patch earlier than 72 hours if you experience unpleasant side effects which may include dry mouth, dizziness or visual disturbances. ?3. Avoid touching the patch. Wash your hands with soap and water after contact with the patch. ?    ? ?

## 2021-08-23 NOTE — Progress Notes (Signed)
Assisted Dr. Turk with left, ultrasound guided, pectoralis block. Side rails up, monitors on throughout procedure. See vital signs in flow sheet. Tolerated Procedure well. 

## 2021-08-26 ENCOUNTER — Encounter: Payer: Self-pay | Admitting: *Deleted

## 2021-08-26 ENCOUNTER — Telehealth: Payer: Self-pay | Admitting: *Deleted

## 2021-08-26 ENCOUNTER — Encounter (HOSPITAL_BASED_OUTPATIENT_CLINIC_OR_DEPARTMENT_OTHER): Payer: Self-pay | Admitting: General Surgery

## 2021-08-26 LAB — SURGICAL PATHOLOGY

## 2021-08-26 NOTE — Telephone Encounter (Signed)
Received order for oncotype testing. Requisition faxed to pathology and [facility] °

## 2021-08-29 ENCOUNTER — Telehealth: Payer: Self-pay | Admitting: *Deleted

## 2021-08-29 NOTE — Telephone Encounter (Signed)
Received VM from patient asking about oncotype results. Left her VM stating we do not have these available yet and will call her as soon as we get them. ?

## 2021-09-02 ENCOUNTER — Telehealth: Payer: Self-pay | Admitting: Genetic Counselor

## 2021-09-02 NOTE — Telephone Encounter (Signed)
I attempted to contact Ms. Atayde to discuss her genetic testing results (47 genes). I left a voicemail requesting she call me back at [phone]. ? ?Lucille Passy, MS, LCGC ?Genetic Counselor ?[email or web address] ?(P) [phone] ? ?

## 2021-09-02 NOTE — Telephone Encounter (Signed)
I contacted Ms. Granville to discuss her genetic testing results. No pathogenic variants were identified in the 47 genes analyzed. 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 Detailed clinic note to follow. ? ?The test report has been scanned into EPIC and is located under the Molecular Pathology section of the Results Review tab.  A portion of the result report is included below for reference.  ? ?Lucille Passy, MS, [place] ?Genetic Counselor ?[email or web address] ?(P) [phone] ? ? ?

## 2021-09-03 ENCOUNTER — Ambulatory Visit: Payer: Self-pay | Admitting: Genetic Counselor

## 2021-09-03 DIAGNOSIS — Z1379 Encounter for other screening for genetic and chromosomal anomalies: Secondary | ICD-10-CM

## 2021-09-03 NOTE — Progress Notes (Signed)
HPI:   ?Lisa [name] was previously seen in the [facility] clinic due to a personal and family history of cancer and concerns regarding a hereditary predisposition to cancer. Please refer to our prior cancer genetics clinic note for more information regarding our discussion, assessment and recommendations, at the time. Lisa [name] recent genetic test results were disclosed to her, as were recommendations warranted by these results. These results and recommendations are discussed in more detail below. ? ?CANCER HISTORY:  ?Oncology History Overview Note  ? Cancer Staging  ?Malignant neoplasm of upper-outer quadrant of left breast in female, estrogen receptor positive (Lisa [name]) ?Staging form: Breast, AJCC 8th Edition ?- Clinical stage from 08/06/2021: Stage IA (cT1c, cN0, cM0, G2, ER+, PR+, HER2-) - Signed by Truitt Merle, MD on 08/13/2021 ? ?  ?Malignant neoplasm of upper-outer quadrant of left breast in female, estrogen receptor positive (Lisa [name])  ?07/27/2021 Mammogram  ? CLINICAL DATA:  Palpable lump in the left breast. ?  ?EXAM: ?DIGITAL DIAGNOSTIC BILATERAL MAMMOGRAM WITH TOMOSYNTHESIS AND CAD; ?ULTRASOUND LEFT BREAST LIMITED ? ?IMPRESSION: ?Highly suspicious left breast mass. No left axillary adenopathy. No ?other abnormalities. ?  ?08/06/2021 Cancer Staging  ? Staging form: Breast, AJCC 8th Edition ?- Clinical stage from 08/06/2021: Stage IA (cT1c, cN0, cM0, G2, ER+, PR+, HER2-) - Signed by Truitt Merle, MD on 08/13/2021 ?Stage prefix: Initial diagnosis ?Histologic grading system: 3 grade system ? ?  ?08/06/2021 Initial Biopsy  ? Diagnosis ?Breast, left, needle core biopsy, Retroareolar 1.6cm left mass 3:00 (ribbon clip) ?- INVASIVE MAMMARY CARCINOMA ?- DUCTAL CARCINOMA IN SITU ?- SEE COMMENT ?Microscopic Comment ?The biopsy material shows an infiltrative proliferation of cells arranged linearly and in small clusters. Based on the biopsy, the carcinoma appears Nottingham grade 2 of 3 and measures 1.5 cm in greatest  linear extent. ? ?E-cadherin is POSITIVE supporting a ductal origin. ? ?PROGNOSTIC INDICATORS ?Results: ?The tumor cells are EQUIVOCAL for Her2 (2+). Her2 by FISH will be performed and the results reported separately. ?Estrogen Receptor: 100%, POSITIVE, STRONG-MODERATE STAINING INTENSITY ?Progesterone Receptor: 100%, POSITIVE, STRONG STAINING INTENSITY ?Proliferation Marker Ki67: 5% ? ?FLUORESCENCE IN-SITU HYBRIDIZATION ?Results: ?GROUP 5: HER2 **NEGATIVE** ?  ?08/12/2021 Initial Diagnosis  ? Malignant neoplasm of upper-outer quadrant of left breast in female, estrogen receptor positive (Lisa [name]) ?  ? Genetic Testing  ? [facility] CustomNext Panel was Negative. 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 Report date is 08/28/2021. ? ?The CustomNext-Cancer+RNAinsight panel offered by [facility] includes sequencing and rearrangement analysis for the following 47 genes:  APC, ATM, AXIN2, BARD1, BMPR1A, BRCA1, BRCA2, BRIP1, CDH1, CDK4, CDKN2A, CHEK2, CTNNA1, DICER1, EPCAM, GREM1, HOXB13, KIT, MEN1, MLH1, MSH2, MSH3, MSH6, MUTYH, NBN, NF1, NTHL1, PALB2, PDGFRA, PMS2, POLD1, POLE, PTEN, RAD50, RAD51C, RAD51D, SDHA, SDHB, SDHC, SDHD, SMAD4, SMARCA4, STK11, TP53, TSC1, TSC2, and VHL.  RNA data is routinely analyzed for use in variant interpretation for all genes. ?  ? ? ?FAMILY HISTORY:  ?We obtained a detailed, 4-generation family history.  Significant diagnoses are listed below: ?     ?Family History  ?Problem Relation Age of Onset  ? Prostate cancer Father    ? Breast cancer Sister    ? Breast cancer Sister    ? Bladder Cancer Maternal Uncle    ? Breast cancer Paternal Aunt    ? Breast cancer Paternal Grandmother    ?  ?  ? ?Lisa [name]'s sister was diagnosed with breast cancer at age 78 and died at age  54. She has another sister who was diagnosed with breast cancer at age 71, she is currently 39. Both sisters reportedly had negative hereditary cancer genetic testing. Her maternal uncle  was diagnosed with cancer, possibly bladder cancer at an unknown age. Her father possibly had prostate cancer, he died at age 25 due to CHF. Her paternal half-aunt was diagnosed with breast cancer at an unknown age (>50), she is deceased. Her paternal grandmother was diagnosed with breast cancer in her 60s, she is deceased. There is no reported Ashkenazi Jewish ancestry.  ? ?GENETIC TEST RESULTS:  ?The [facility] CustomNext Panel found no pathogenic mutations.  ? ?The CustomNext-Cancer+RNAinsight panel offered by [facility] includes sequencing and rearrangement analysis for the following 47 genes:  APC, ATM, AXIN2, BARD1, BMPR1A, BRCA1, BRCA2, BRIP1, CDH1, CDK4, CDKN2A, CHEK2, CTNNA1, DICER1, EPCAM, GREM1, HOXB13, KIT, MEN1, MLH1, MSH2, MSH3, MSH6, MUTYH, NBN, NF1, NTHL1, PALB2, PDGFRA, PMS2, POLD1, POLE, PTEN, RAD50, RAD51C, RAD51D, SDHA, SDHB, SDHC, SDHD, SMAD4, SMARCA4, STK11, TP53, TSC1, TSC2, and VHL.  RNA data is routinely analyzed for use in variant interpretation for all genes. ? ?The test report has been scanned into EPIC and is located under the Molecular Pathology section of the Results Review tab.  A portion of the result report is included below for reference. Genetic testing reported out on 08/28/2021.  ? ? ? ? ? 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  At this time, it is unknown if this variant is associated with an increased risk for cancer or if it is benign, but most uncertain variants are reclassified to benign. It should not be used to make medical management decisions. With time, we suspect the laboratory will determine the significance of this variant, if any. If the laboratory reclassifies this variant, we will attempt to contact Ms. Lisa [name] to discuss it further.  ? ?Even though a pathogenic variant was not identified, possible explanations for her personal history of cancer may include: ?There may be no hereditary risk for cancer  in the family. The cancers in Lisa [name] and/or her family may be due to other genetic or environmental factors. ?There may be a gene mutation in one of these genes that current testing methods cannot detect, but that chance is small. ?There could be another gene that has not yet been discovered, or that we have not yet tested, that is responsible for the cancer diagnoses in the family.  ? ?Therefore, it is important to remain in touch with cancer genetics in the future so that we can continue to offer Lisa [name] the most up to date genetic testing.  ? ?ADDITIONAL GENETIC TESTING:  ?We discussed with Lisa [name] that her genetic testing was fairly extensive.  If there are genes identified to increase cancer risk that can be analyzed in the future, we would be happy to discuss and coordinate this testing at that time.   ? ?CANCER SCREENING RECOMMENDATIONS:  ?Ms. Akopyan's test result is considered negative (normal).  This means that we have not identified a hereditary cause for her personal and family history of cancer at this time.  ? ?An individual's cancer risk and medical management are not determined by genetic test results alone. Overall cancer risk assessment incorporates additional factors, including personal medical history, family history, and any available genetic information that may result in a personalized plan for cancer prevention and surveillance. Therefore, it is recommended she continue to follow the cancer management and screening guidelines  provided by her oncology and primary healthcare provider. ? ?RECOMMENDATIONS FOR FAMILY MEMBERS:   ?Since she did not inherit a mutation in a cancer predisposition gene included on this panel, her children could not have inherited a mutation from her in one of these genes. ?Individuals in this family might be at some increased risk of developing cancer, over the general population risk, due to the family history of cancer. We recommend women in this family have  a yearly mammogram beginning at age 63, or 66 years younger than the earliest onset of cancer, an annual clinical breast exam, and perform monthly breast self-exams.  ?We do not recommend familial test

## 2021-09-05 ENCOUNTER — Telehealth: Payer: Self-pay | Admitting: *Deleted

## 2021-09-05 DIAGNOSIS — C50412 Malignant neoplasm of upper-outer quadrant of left female breast: Secondary | ICD-10-CM

## 2021-09-05 NOTE — Telephone Encounter (Signed)
Received Oncotype score of 15. Physician team notified. Called pt with results. Discussed chemo not recommended and next step is xrt. Received verbal understanding. ? ?Pt not swelling and pain to breast and axillary incision. No redness or drainage noted.  ?Msg sent to Dr. Barry Dienes nurse with request to call pt for assessment. Discussed with pt use of ice packs and ibuprofen for pain relief. ? ?

## 2021-09-06 ENCOUNTER — Encounter (HOSPITAL_COMMUNITY): Payer: Self-pay

## 2021-09-06 ENCOUNTER — Other Ambulatory Visit (HOSPITAL_BASED_OUTPATIENT_CLINIC_OR_DEPARTMENT_OTHER): Payer: Self-pay

## 2021-09-06 MED ORDER — FARXIGA 5 MG PO TABS
ORAL_TABLET | ORAL | 3 refills | Status: AC
  Filled 2021-09-06: qty 30, 30d supply, fill #0
  Filled 2021-10-04: qty 30, 30d supply, fill #1
  Filled 2021-11-05: qty 30, 30d supply, fill #2
  Filled 2021-12-03: qty 30, 30d supply, fill #3
  Filled 2022-01-02: qty 30, 30d supply, fill #4
  Filled 2022-02-03: qty 30, 30d supply, fill #5
  Filled 2022-03-16: qty 30, 30d supply, fill #6
  Filled 2022-04-16: qty 30, 30d supply, fill #7
  Filled 2022-05-16: qty 30, 30d supply, fill #8
  Filled 2022-06-16: qty 30, 30d supply, fill #9
  Filled 2022-07-22: qty 30, 30d supply, fill #10
  Filled 2022-08-14: qty 30, 30d supply, fill #11

## 2021-09-06 MED ORDER — EZETIMIBE 10 MG PO TABS
ORAL_TABLET | ORAL | 0 refills | Status: DC
  Filled 2021-09-06: qty 30, 30d supply, fill #0

## 2021-09-10 ENCOUNTER — Encounter: Payer: Self-pay | Admitting: *Deleted

## 2021-09-11 ENCOUNTER — Encounter: Payer: Self-pay | Admitting: Physical Therapy

## 2021-09-11 ENCOUNTER — Ambulatory Visit: Payer: Managed Care, Other (non HMO) | Attending: Hematology | Admitting: Physical Therapy

## 2021-09-11 ENCOUNTER — Other Ambulatory Visit (HOSPITAL_BASED_OUTPATIENT_CLINIC_OR_DEPARTMENT_OTHER): Payer: Self-pay

## 2021-09-11 DIAGNOSIS — Z170 Estrogen receptor positive status [ER+]: Secondary | ICD-10-CM | POA: Diagnosis present

## 2021-09-11 DIAGNOSIS — C50412 Malignant neoplasm of upper-outer quadrant of left female breast: Secondary | ICD-10-CM | POA: Insufficient documentation

## 2021-09-11 DIAGNOSIS — R293 Abnormal posture: Secondary | ICD-10-CM | POA: Diagnosis present

## 2021-09-11 MED ORDER — SPIRONOLACTONE 25 MG PO TABS
25.0000 mg | ORAL_TABLET | Freq: Every day | ORAL | 1 refills | Status: DC
  Filled 2021-09-11: qty 30, 30d supply, fill #0
  Filled 2021-10-20: qty 30, 30d supply, fill #1

## 2021-09-11 NOTE — Therapy (Signed)
?OUTPATIENT PHYSICAL THERAPY BREAST CANCER POST OP FOLLOW UP ? ? ?Patient Name: Lisa [name] ?MRN: [id number] ?DOB:Aug 22, 1960, 61 y.o., female ?Today's Date: 09/11/2021 ? ? PT End of Session - 09/11/21 1502   ? ? Visit Number 2   ? Number of Visits 2   ? Date for PT Re-Evaluation 09/18/21   ? PT Start Time 1503   ? PT Stop Time 1525   ? PT Time Calculation (min) 22 min   ? Activity Tolerance Patient tolerated treatment well   ? Behavior During Therapy [facility] for tasks assessed/performed   ? ?  ?  ? ?  ? ? ?Past Medical History:  ?Diagnosis Date  ? Anxiety   ? Arthritis   ? Breast cancer ([place]) 06/28/21  ? lump found  ? Breast cancer ([place])   ? left breast IMC  ? Breast mass, left 07/10/2021  ? Bulging disc L-5  ? Chicken pox   ? Depression   ? Diabetes mellitus   ? Hyperlipidemia   ? Hypertension   ? Insomnia   ? Major bone defects   ? 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 Meningitis 06/11/2021  ? bacterial meningitis  ? Polycystic ovarian disease   ? ?Past Surgical History:  ?Procedure Laterality Date  ? BREAST LUMPECTOMY WITH SENTINEL LYMPH NODE BIOPSY Left 08/23/2021  ? Procedure: LEFT BREAST LUMPECTOMY WITH SENTINEL LYMPH NODE BIOPSY;  Surgeon: Stark Klein, MD;  Location: [facility];  Service: General;  Laterality: Left;  ? csf leak in skull  2005  ? noted failed attempt to close CSF leak in skull  ? [place]  ? miscarriage  ? IR FLUORO GUIDED NEEDLE PLC ASPIRATION/INJECTION LOC  06/12/2021  ? RADIOLOGY WITH ANESTHESIA N/A 06/12/2021  ? Procedure: IR WITH ANESTHESIA;  Surgeon: Radiologist, Medication, MD;  Location: [facility];  Service: Radiology;  Laterality: N/A;  ? [place]  ? ?Patient Active Problem List  ? Diagnosis Date Noted  ? Genetic testing 08/22/2021  ? Family history of breast cancer 08/14/2021  ? Malignant neoplasm of upper-outer quadrant of left breast in female, estrogen receptor positive ([place]) 08/12/2021  ? Breast mass, left  07/10/2021  ? Fever   ? Bacterial meningitis   ? Right sided weakness   ? Pressure injury of skin 06/13/2021  ? Sepsis due to Haemophilus influenzae with acute hypoxic respiratory failure and septic shock ([place])   ? Acute bacterial meningitis 06/12/2021  ? Acute metabolic encephalopathy 44/06/270  ? Altered mental status 06/12/2021  ? Obesity, morbid, BMI 40.0-49.9 ([place]) 03/24/2016  ? Family history of breast cancer in sister 10/21/2013  ? Tick bite of back 10/21/2012  ? Screening for malignant neoplasm of the cervix 05/19/2012  ? Routine gynecological examination 05/19/2012  ? Cough 03/05/2012  ? Tobacco use 12/30/2011  ? Diabetes mellitus, type 2 ([place]) 12/29/2011  ? HTN (hypertension) 12/29/2011  ? Hyperlipidemia 12/29/2011  ? CSF leak 12/29/2011  ? Psoriasis 12/29/2011  ? PCOS (polycystic ovarian syndrome) 12/29/2011  ? Depression with anxiety 12/29/2011  ? ? ?PCP: Bertell Maria, FNP ? ?REFERRING PROVIDER: Truitt Merle, MD ? ?REFERRING DIAG: REFERRING DIAG: C50.412,Z17.0 (ICD-10-CM) - Malignant neoplasm of upper-outer quadrant of left breast in female, estrogen receptor positive ([place])  ? ?THERAPY DIAG:  ?Abnormal posture ? ?Malignant neoplasm of upper-outer quadrant of left breast in female, estrogen receptor positive ([place]) ? ?ONSET DATE: 08/06/21 ? ?SUBJECTIVE:                                                                                                                                                                                          ? ?  SUBJECTIVE STATEMENT: ?Everything I am having trouble with is on the opposite side of my surgery and is not related to the surgery. I was having some irritation and some swelling in my left axilla and they said it was fluid. The doctor said we could let it go on its own. I was afraid to have it aspirated due to fear of infection. It has gone down some.  ? ?PERTINENT HISTORY:  ?Patient was diagnosed on 08/06/21 with left grade 2. It measures 1.5 cm and is located in the upper  outer quadrant. It is ER+/PR+/Her2- with a Ki67 of 5%. 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 ?PATIENT GOALS:  Reassess how my recovery is going related to arm function, pain, and swelling. ? ?PAIN:  ?Are you having pain? Yes - In right arm and R leg, shooting pain, burning pain to touch, 5/10, worse at night time, repositioning during the night improves the pain ? ?PRECAUTIONS: Recent Surgery, left UE Lymphedema risk,  ? ?ACTIVITY LEVEL / LEISURE: pt does not exercise ? ? ?OBJECTIVE:  ? ?PATIENT SURVEYS:  ?QUICK DASH: - pt reports problems are on her non surgical side ? Katina Dung - 09/11/21 0001   ? ? Open a tight or new jar Severe difficulty   ? Do heavy household chores (wash walls, wash floors) Severe difficulty   ? Carry a shopping bag or briefcase Severe difficulty   ? Wash your back Moderate difficulty   ? Use a knife to cut food No difficulty   ? Recreational activities in which you take some force or impact through your arm, shoulder, or hand (golf, hammering, tennis) Unable   ? During the past week, to what extent has your arm, shoulder or hand problem interfered with your normal social activities with family, friends, neighbors, or groups? Quite a bit   ? During the past week, to what extent has your arm, shoulder or hand problem limited your work or other regular daily activities Quite a bit   ? Arm, shoulder, or hand pain. Severe   ? Tingling (pins and needles) in your arm, shoulder, or hand Severe   ? Difficulty Sleeping Severe difficulty   ? DASH Score 68.18 %   ? ?  ?  ? ?  ? ? Katina Dung - 09/11/21 0001   ? ? Open a tight or new jar Severe difficulty   ? Do heavy household chores (wash walls, wash floors) Severe difficulty   ? Carry a shopping bag or briefcase Severe difficulty   ? Wash your back Moderate difficulty   ? Use a knife to cut food No difficulty    ? Recreational activities in which you take some force or impact through your arm, shoulder, or hand (golf, hammering, tennis) Unable   ? During the past week, to what extent has your arm, shoulder or hand problem interfered with your normal social activities with family, friends, neighbors, or groups? Quite a bit   ? During the past week, to what extent has your arm, shoulder or hand problem limited your work or other regular daily activities Quite a bit   ? Arm, shoulder, or hand pain. Severe   ? Tingling (pins and needles) in your arm, shoulder, or hand Severe   ? Difficulty Sleeping Severe difficulty   ? DASH Score 68.18 %   ? ?  ?  ? ?  ?  ? ? ?  OBSERVATIONS: ? Well healing lumpectomy and SLNB scars on the L, some scar tissue palpable - educated pt in scar mobilization at 6 weeks, some slight edema present in area of lymph node biopsy scar but pt reports this has been improving ? ?POSTURE:  ?Forward head, rounded shoulders ? ?LYMPHEDEMA ASSESSMENT:  ? ?UPPER EXTREMITY AROM/PROM: ?  ?A/PROM RIGHT  08/21/2021 ?   ?Shoulder extension 52  ?Shoulder flexion 110  ?Shoulder abduction 104  ?Shoulder internal rotation 30  ?Shoulder external rotation 56  ?                        (Blank rows = not tested) ?  ?A/PROM LEFT  08/21/2021 LEFT 09/11/21  ?Shoulder extension 67 73  ?Shoulder flexion 163 165  ?Shoulder abduction 165 175  ?Shoulder internal rotation 56 60  ?Shoulder external rotation 76 78  ?                        (Blank rows = not tested) ?  ?  ?  ?UPPER EXTREMITY STRENGTH: not tested on R due to increased pain from rotator cuff tear ?  ?  ?LYMPHEDEMA ASSESSMENTS:  ?  ?[place] RIGHT  08/21/2021  ?10 cm proximal to olecranon process 30.2  ?Olecranon process 26  ?10 cm proximal to ulnar styloid process 21.3  ?Just proximal to ulnar styloid process 17  ?Across hand at thumb web space 19.6  ?At base of 2nd digit 6.4  ?(Blank rows = not tested) ?  ?[place] LEFT  08/21/2021  ?10 cm proximal to olecranon process 29.7   ?Olecranon process 26.5  ?10 cm proximal to ulnar styloid process 21.5  ?Just proximal to ulnar styloid process 17.1  ?Across hand at thumb web space 20.2  ?At base of 2nd digit 6.4  ?(Blank rows = not tested)

## 2021-09-12 ENCOUNTER — Encounter: Payer: Managed Care, Other (non HMO) | Admitting: Genetic Counselor

## 2021-09-12 ENCOUNTER — Other Ambulatory Visit: Payer: Managed Care, Other (non HMO)

## 2021-09-16 NOTE — Progress Notes (Signed)
Location of Breast Cancer:  ?Malignant neoplasm of upper-outer quadrant of left breast in female, estrogen receptor positive ? ?Histology per Pathology Report:  ?08/23/2021 ?FINAL MICROSCOPIC DIAGNOSIS:  ?A. BREAST, LEFT, LUMPECTOMY:  ?- Invasive and in situ ductal carcinoma, 1.9 cm.  ?- Invasive carcinoma 0.2 cm from posterior margin and 0.3 cm from anterior margin.  ?- DCIS 0.1 cm from medial margin.  ?- Biopsy site and biopsy clip.  ?- See oncology table.  ?B. LYMPH NODE, LEFT AXILLARY #1, SENTINEL, EXCISION:  ?- One lymph node negative for metastatic carcinoma (0/1).  ?C. LYMPH NODE, LEFT AXILLARY #2, SENTINEL, EXCISION:  ?- Metastatic carcinoma in one lymph node (1/1).  ?- Metastasis is 0.4 cm.  ?D. LYMPH NODE, LEFT AXILLARY #3, SENTINEL, EXCISION:  ?- One lymph node negative for metastatic carcinoma (0/1).  ?E. LYMPH NODE, LEFT AXILLARY #4, SENTINEL, EXCISION:  ?- One lymph node negative for metastatic carcinoma (0/1).  ?F. LYMPH NODE, LEFT AXILLARY #5, SENTINEL, EXCISION:  ?- One lymph node negative for metastatic carcinoma (0/1).  ? ?Receptor Status: ER(100%), PR (100%), Her2-neu (Negative via FISH), Ki-67(5%) ? ?Did patient present with symptoms (if so, please note symptoms) or was this found on screening mammography?: Patient presented with palpable left breast mass. B/L diagnostic MM and left [place] on 07/27/21 showed 14 mm mass at 3 o'clock. ? ?Past/Anticipated interventions by surgeon, if any:  ?09/06/2021 ?--Puja Gosai-PA (Office visit)  ?She presented to the office today with a possible seroma under her axillary incision, but no signs of infection. There is no seroma at the lumpectomy incision.  ?We discussed possible aspiration, but given that she has a history of bacterial meningitis, she is hesitant to do a procedure that could introduce infection.  ?She is comfortable with continuing to monitor the area and calling if she develops any worsening issues.  ?If radiation oncology would like [place] to aspirate  the fluid collection, we will work her in to re-evaluate. ? ?08/23/2021 ?--Dr. Stark Klein ?Left Breast Lumpectomy with Sentinel Node Mapping and Biopsy Procedure Note ? ?Past/Anticipated interventions by medical oncology, if any:  ?Under care of Dr. Truitt Merle ?08/14/2021 ?Given the early stage disease, she likely need a lumpectomy and sentinel lymph node biopsy.  ?She is agreeable with that. She was seen by Dr. Barry Dienes today and likely will proceed with surgery soon.  ?I recommend a Oncotype Dx test on the surgical sample and we'll make a decision about adjuvant chemotherapy based on the Oncotype result.  ?09/05/2021: "Received Oncotype score of 15. Physician team notified. Called pt with results. Discussed chemo not recommended and next step is xrt. Received verbal understanding." ?Giving the strong ER and PR expression in her postmenopausal status, I recommend adjuvant endocrine therapy with aromatase inhibitor for a total of 5-10 years to reduce the risk of cancer recurrence.  ?I would recommend exemestane given her arthritis and potential interaction of Tamoxifen with her antidepressants.  ?She was also seen by radiation oncologist Dr. Isidore Moos today.  ?She will likely benefit from breast radiation if she undergo lumpectomy to decrease the risk of breast cancer.  ?We also discussed the breast cancer surveillance after her surgery.  ?She will continue annual screening mammogram, self exam, and a routine office visit with lab and exam with [place].  ?--PLAN:  ?proceed with lumpectomy and sentinel lymph node biopsy with Dr. Barry Dienes ?oncotype on surgical sample ?I will see her back either after surgery or radiation ? ?Lymphedema issues, if any:  Patient denies; had  PT  evaluation on 09/11/21 ? ?Pain issues, if any:  Soreness to left breast, as well as chronic pain issues (managed by her PCP Dr. Collene Leyden) ? ?SAFETY ISSUES: ?Prior radiation? No ?Pacemaker/ICD? No ?Possible current pregnancy? No--postmenopausal ?Is the patient  on methotrexate? No ? ?Current Complaints / other details:  Has 2 sister with breast cancer. Was hospitalized earlier this year for bacterial meningitis.  ?   ? ? ? ?

## 2021-09-16 NOTE — Progress Notes (Signed)
?Radiation Oncology         [phone] ?________________________________ ? ?Name: Lisa [name] MRN: [id number]  ?Date: 09/17/2021  DOB: 07-Feb-1961 ? ?Follow-Up Visit Note ? ?Outpatient ? ?CC: Collene Leyden, MD  Truitt Merle, MD ? ?Diagnosis:    ?  ICD-10-CM   ?1. Malignant neoplasm of upper-outer quadrant of left breast in female, estrogen receptor positive ([place])  C50.412   ? Z17.0   ?  ?  ?S/p left lumpectomy: Left Breast UOQ, Invasive ductal carcinoma with intermediate grade ductal carcinoma in-situ, ER+ / PR+ / Her2-, Grade 2 ? ? Cancer Staging  ?Malignant neoplasm of upper-outer quadrant of left breast in female, estrogen receptor positive ([place]) ?Staging form: Breast, AJCC 8th Edition ?- Clinical stage from 08/06/2021: Stage IA (cT1c, cN0, cM0, G2, ER+, PR+, HER2-) - Signed by Truitt Merle, MD on 08/13/2021 ?pT1c, pN1a  ? ?CHIEF COMPLAINT: Here to discuss management of left breast cancer ? ?Narrative:  The patient returns today for follow-up.   ?  ?Since breast clinic consultation date of 08/14/21, she underwent genetic testing on that same date which revealed no clinically significant variants detected by BRCAplus or +RNAinsight testing. 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  ? ?The patient opted to proceed with left breast lumpectomy and nodal biopsies on 08/23/21 under the care of Dr. Barry Dienes. Pathology from the procedure revealed: tumor size of 1.9 cm; histology of invasive ductal carcinoma and intermediate grade ductal carcinoma in-situ; all margins negative for both invasive and in situ carcinoma; margin status to invasive disease of 0.2 cm from the posterior margin and 0.3 cm from the anterior margin; margin status to in situ disease of 0.1 cm from the medial margin; nodal status of 1/5 left axillary sentinel lymph nodes excisions positive for metastatic carcinoma (metastatic deposit measuring 0.4 cm) ;  ER status: 100% positive with  strong-moderate staining intensity; PR status 100% positive with strong staining intensity; Proliferation marker Ki67 at 5%; Her2 status negative; Grade 2. ? ?Oncotype DX was obtained on the final surgical sample and the recurrence score of 15 predicts a risk of recurrence outside the breast over the next 9 years of 14%, if the patient's only systemic therapy is an antiestrogen for 5 years.  It also predicts no significant benefit from chemotherapy. ? ?Given her Oncotype results, the patient will return to Dr. Burr Medico in the near future to further discuss adjuvant endocrine therapy with AI. Per her initial consultation with Dr. Burr Medico on 08/14/21, Dr. Burr Medico recommended treatment for a total of 5-10 years to reduce the risk of cancer recurrence. In particular, Dr. Burr Medico recommended exemestane given her arthritis, and to avoid any potential interactions with Tamoxifen and her antidepressants.  ? ?The patient recently presented for an urgent post-op follow up on 09/06/21 due to axillary pain and swelling. The patient noted the she felt as though there was fluid in the axilla and breast. Physical exam performed revealed a possible small fluid collection beneath the surgical incision site. No evidence of infection was appreciated. Given that the patient has a history of bacterial meningitis, she was understandably hesitant to do a procedure for this that could risk infection. The patient will continue to monitor this area and call back if she has any issues. ? ?Symptomatically, the patient reports:  ?Lymphedema issues, if any:  Patient denies; had  PT evaluation on 09/11/21 ? ?Pain issues, if any:  Soreness to left breast, as well as  chronic pain issues (managed by her PCP Dr. Collene Leyden) ? ?SAFETY ISSUES: ?Prior radiation? No ?Pacemaker/ICD? No ?Possible current pregnancy? No--postmenopausal ?Is the patient on methotrexate? No ? ?Current Complaints / other details:  Has 2 sister with breast cancer. Was hospitalized earlier this  year for bacterial meningitis.  ?   ? ?       ?ALLERGIES:  is allergic to statins and glimepiride. ? ?Meds: ?Current Outpatient Medications  ?Medication Sig Dispense Refill  ? albuterol (VENTOLIN HFA) 108 (90 Base) MCG/ACT inhaler Inhale 2 puffs into the lungs every 6 (six) hours as needed for wheezing. 8.5 g 0  ? ALPRAZolam (XANAX) 0.25 MG tablet Take 1 tablet (0.25 mg total) by mouth 2 (two) times daily as needed for anxiety. 20 tablet 0  ? aspirin EC 81 MG tablet Take 81 mg by mouth daily. Swallow whole.    ? buPROPion (WELLBUTRIN XL) 300 MG 24 hr tablet Take 1 tablet by mouth in the morning 90 tablet 0  ? buPROPion (WELLBUTRIN XL) 300 MG 24 hr tablet Take 1 tablet by mouth every morning.    ? dapagliflozin propanediol (FARXIGA) 5 MG TABS tablet Take 1 tablet by mouth once a day 90 tablet 3  ? diclofenac (VOLTAREN) 75 MG EC tablet Take 1 tablet by mouth twice a day as needed 180 tablet 0  ? diclofenac Sodium (VOLTAREN) 1 % GEL Apply 4 g topically every 6 (six) hours as needed (Shoulder pain). 100 g 0  ? Eszopiclone 3 MG TABS TAKE 1 TABLET BY MOUTH ONCE DAILY IMMEDIATELY BEFORE BEDTIME 30 tablet 0  ? ezetimibe (ZETIA) 10 MG tablet Take 1 tablet by mouth once a day 60 tablet 0  ? insulin glargine (LANTUS) 100 UNIT/ML Solostar Pen Inject 5 Units into the skin at bedtime. 15 mL 11  ? liraglutide (VICTOZA) 18 MG/3ML SOPN Inject 0.6 mg into the skin daily.    ? lisinopril (ZESTRIL) 5 MG tablet Take 1 tablet (5 mg total) by mouth daily. 30 tablet 0  ? metFORMIN (GLUCOPHAGE) 500 MG tablet Take 1 tablet by mouth in the morning, and 2 tablets at night 90 tablet 0  ? ondansetron (ZOFRAN) 4 MG tablet Take 1 tablet (4 mg total) by mouth every 8 (eight) hours as needed for nausea or vomiting. 20 tablet 0  ? oxyCODONE (OXY IR/ROXICODONE) 5 MG immediate release tablet Take 1 tablet (5 mg total) by mouth every 6 (six) hours as needed for severe pain. 8 tablet 0  ? oxycodone (OXY-IR) 5 MG capsule Take 5 mg by mouth every 4 (four)  hours as needed.    ? sertraline (ZOLOFT) 100 MG tablet Take 2 tablets by mouth once daily 60 tablet 1  ? sertraline (ZOLOFT) 100 MG tablet Take 2 tablets by mouth daily.    ? spironolactone (ALDACTONE) 25 MG tablet TAKE 1 TABLET BY MOUTH ONCE DAILY (Patient taking differently: Take 25 mg by mouth daily.) 90 tablet 2  ? spironolactone (ALDACTONE) 25 MG tablet Take 1 tablet (25 mg total) by mouth daily. 30 tablet 1  ? tiZANidine (ZANAFLEX) 4 MG capsule Take 4 mg by mouth 3 (three) times daily.    ? ?No current facility-administered medications for this encounter.  ? ? ?Physical Findings: ? height is 5' 6" (1.676 m) and weight is 258 lb 2 oz (117.1 kg). Her temporal temperature is 96.1 ?F (35.6 ?C) (abnormal). Her blood pressure is 114/51 (abnormal) and her pulse is 84. Her respiration is 20 and oxygen saturation  is 99%. .    ? ?General: Alert and oriented, in no acute distress ?Musculoskeletal: Satisfactory range of motion in left shoulder ?Neurologic: No obvious focalities. Speech is fluent.  ?Psychiatric: Judgment and insight are intact. Affect is appropriate. ?Breast exam reveals satisfactory healing of left breast surgical scars ? ?Lab Findings: ?Lab Results  ?Component Value Date  ? WBC 7.2 08/14/2021  ? HGB 13.3 08/14/2021  ? HCT 38.7 08/14/2021  ? MCV 89.2 08/14/2021  ? PLT 325 08/14/2021  ? ? ?_0 @ ? ?Radiographic Findings: ?No results found. ? ?Impression/Plan: ?We discussed adjuvant radiotherapy today.  We also discussed the positive sentinel lymph node.  Given that only 1 of 5 sentinel lymph nodes was positive, and this node was clinically negative, I think that she is a good fit for being treated with high tangents per the ACOSOG Z-011 trial.  The risk of disease in the supraclavicular fossa is extremely small and therefore we will not treat those nodes. I recommend 3 weeks of radiation therapy to the left breast and axilla followed by 1 week of radiation therapy as a lumpectomy boost.   Hypofractionation will be used. Radiation will be given in order to reduce risk of local regional recurrence by two thirds.  I reviewed the logistics, benefits, risks, and potential side effects of this treatment

## 2021-09-17 ENCOUNTER — Other Ambulatory Visit: Payer: Self-pay | Admitting: Family Medicine

## 2021-09-17 ENCOUNTER — Other Ambulatory Visit (HOSPITAL_BASED_OUTPATIENT_CLINIC_OR_DEPARTMENT_OTHER): Payer: Self-pay

## 2021-09-17 ENCOUNTER — Other Ambulatory Visit (HOSPITAL_COMMUNITY): Payer: Self-pay

## 2021-09-17 ENCOUNTER — Ambulatory Visit
Admission: RE | Admit: 2021-09-17 | Discharge: 2021-09-17 | Disposition: A | Discharge status: Home / Self Care | Payer: Commercial Managed Care - HMO | Source: Ambulatory Visit | Attending: Radiation Oncology | Admitting: Radiation Oncology

## 2021-09-17 ENCOUNTER — Encounter: Payer: Self-pay | Admitting: Radiation Oncology

## 2021-09-17 ENCOUNTER — Other Ambulatory Visit: Payer: Self-pay

## 2021-09-17 VITALS — BP 114/51 | HR 84 | Temp 96.1°F | Resp 20 | Ht 66.0 in | Wt 258.1 lb

## 2021-09-17 DIAGNOSIS — Z7982 Long term (current) use of aspirin: Secondary | ICD-10-CM | POA: Insufficient documentation

## 2021-09-17 DIAGNOSIS — Z170 Estrogen receptor positive status [ER+]: Secondary | ICD-10-CM | POA: Insufficient documentation

## 2021-09-17 DIAGNOSIS — Z803 Family history of malignant neoplasm of breast: Secondary | ICD-10-CM | POA: Insufficient documentation

## 2021-09-17 DIAGNOSIS — Z7985 Long-term (current) use of injectable non-insulin antidiabetic drugs: Secondary | ICD-10-CM | POA: Insufficient documentation

## 2021-09-17 DIAGNOSIS — Z7984 Long term (current) use of oral hypoglycemic drugs: Secondary | ICD-10-CM | POA: Insufficient documentation

## 2021-09-17 DIAGNOSIS — C50412 Malignant neoplasm of upper-outer quadrant of left female breast: Secondary | ICD-10-CM | POA: Insufficient documentation

## 2021-09-17 DIAGNOSIS — Z79899 Other long term (current) drug therapy: Secondary | ICD-10-CM | POA: Insufficient documentation

## 2021-09-17 DIAGNOSIS — Z510 Encounter for antineoplastic radiation therapy: Secondary | ICD-10-CM | POA: Insufficient documentation

## 2021-09-17 DIAGNOSIS — Z791 Long term (current) use of non-steroidal anti-inflammatories (NSAID): Secondary | ICD-10-CM | POA: Insufficient documentation

## 2021-09-17 MED ORDER — BUPROPION HCL ER (XL) 300 MG PO TB24
300.0000 mg | ORAL_TABLET | Freq: Every morning | ORAL | 0 refills | Status: DC
  Filled 2021-09-17: qty 30, 30d supply, fill #0
  Filled 2021-10-20: qty 30, 30d supply, fill #1
  Filled 2021-11-20: qty 30, 30d supply, fill #2

## 2021-09-18 ENCOUNTER — Other Ambulatory Visit (HOSPITAL_BASED_OUTPATIENT_CLINIC_OR_DEPARTMENT_OTHER): Payer: Self-pay

## 2021-09-18 ENCOUNTER — Encounter: Payer: Self-pay | Admitting: Genetic Counselor

## 2021-09-18 MED ORDER — LANTUS SOLOSTAR 100 UNIT/ML ~~LOC~~ SOPN
PEN_INJECTOR | SUBCUTANEOUS | 0 refills | Status: DC
  Filled 2021-09-18: qty 9, 90d supply, fill #0
  Filled 2021-12-23: qty 6, 60d supply, fill #1

## 2021-09-18 MED ORDER — SERTRALINE HCL 100 MG PO TABS
ORAL_TABLET | ORAL | 0 refills | Status: DC
  Filled 2021-09-18: qty 60, 30d supply, fill #0
  Filled 2021-10-21: qty 60, 30d supply, fill #1
  Filled 2021-11-20: qty 60, 30d supply, fill #2

## 2021-09-20 DIAGNOSIS — Z510 Encounter for antineoplastic radiation therapy: Secondary | ICD-10-CM | POA: Diagnosis not present

## 2021-09-23 ENCOUNTER — Encounter: Payer: Self-pay | Admitting: *Deleted

## 2021-09-24 ENCOUNTER — Telehealth: Payer: Self-pay | Admitting: Hematology

## 2021-09-24 NOTE — Telephone Encounter (Signed)
.  Called pt per 4/17 inbasket , Patient was unavailable, a message with appt time and date was left with number on file.   ?

## 2021-09-25 ENCOUNTER — Other Ambulatory Visit: Payer: Self-pay

## 2021-09-25 ENCOUNTER — Ambulatory Visit
Admission: RE | Admit: 2021-09-25 | Discharge: 2021-09-25 | Disposition: A | Discharge status: Home / Self Care | Payer: Commercial Managed Care - HMO | Source: Ambulatory Visit | Attending: Radiation Oncology | Admitting: Radiation Oncology

## 2021-09-25 ENCOUNTER — Other Ambulatory Visit (HOSPITAL_BASED_OUTPATIENT_CLINIC_OR_DEPARTMENT_OTHER): Payer: Self-pay

## 2021-09-25 DIAGNOSIS — Z510 Encounter for antineoplastic radiation therapy: Secondary | ICD-10-CM | POA: Diagnosis not present

## 2021-09-25 LAB — RAD ONC ARIA SESSION SUMMARY
Course Elapsed Days: 0
Plan Fractions Treated to Date: 1
Plan Prescribed Dose Per Fraction: 2.67 Gy
Plan Total Fractions Prescribed: 15
Plan Total Prescribed Dose: 40.05 Gy
Reference Point Dosage Given to Date: 2.67 Gy
Reference Point Session Dosage Given: 2.67 Gy
Session Number: 1

## 2021-09-25 MED ORDER — METFORMIN HCL 500 MG PO TABS
ORAL_TABLET | ORAL | 2 refills | Status: DC
  Filled 2021-09-25: qty 90, 30d supply, fill #0
  Filled 2021-10-22: qty 90, 30d supply, fill #1
  Filled 2021-11-20: qty 90, 30d supply, fill #2

## 2021-09-26 ENCOUNTER — Other Ambulatory Visit: Payer: Self-pay

## 2021-09-26 ENCOUNTER — Ambulatory Visit
Admission: RE | Admit: 2021-09-26 | Discharge: 2021-09-26 | Disposition: A | Discharge status: Home / Self Care | Payer: Commercial Managed Care - HMO | Source: Ambulatory Visit | Attending: Radiation Oncology | Admitting: Radiation Oncology

## 2021-09-26 ENCOUNTER — Telehealth: Payer: Self-pay | Admitting: *Deleted

## 2021-09-26 DIAGNOSIS — Z510 Encounter for antineoplastic radiation therapy: Secondary | ICD-10-CM | POA: Diagnosis not present

## 2021-09-26 LAB — RAD ONC ARIA SESSION SUMMARY
Course Elapsed Days: 1
Plan Fractions Treated to Date: 2
Plan Prescribed Dose Per Fraction: 2.67 Gy
Plan Total Fractions Prescribed: 15
Plan Total Prescribed Dose: 40.05 Gy
Reference Point Dosage Given to Date: 5.34 Gy
Reference Point Session Dosage Given: 2.67 Gy
Session Number: 2

## 2021-09-26 NOTE — Telephone Encounter (Signed)
Returned call to the patient who called with complaints of creamy white/bloody discharge from her Left nipple.  She states she had just spoken with Dr. Marlowe Aschoff nurse who will pass a message to Dr. Barry Dienes.  I spoke with Dr. Sondra Come who states it is not unusual to have some swelling or discharge during the early days of treatment especially if there was some fluid prior to starting treatment.  Patient was informed that we would be happy to take a look when she is here tomorrow for treatment.  She verbalized understanding and appreciated the return call. ? ?Gloriajean Dell. Leonie Green, BSN  ?

## 2021-09-27 ENCOUNTER — Other Ambulatory Visit: Payer: Self-pay

## 2021-09-27 ENCOUNTER — Ambulatory Visit
Admission: RE | Admit: 2021-09-27 | Discharge: 2021-09-27 | Disposition: A | Discharge status: Home / Self Care | Payer: Commercial Managed Care - HMO | Source: Ambulatory Visit | Attending: Radiation Oncology | Admitting: Radiation Oncology

## 2021-09-27 DIAGNOSIS — Z510 Encounter for antineoplastic radiation therapy: Secondary | ICD-10-CM | POA: Diagnosis not present

## 2021-09-27 LAB — RAD ONC ARIA SESSION SUMMARY
Course Elapsed Days: 2
Plan Fractions Treated to Date: 3
Plan Prescribed Dose Per Fraction: 2.67 Gy
Plan Total Fractions Prescribed: 15
Plan Total Prescribed Dose: 40.05 Gy
Reference Point Dosage Given to Date: 8.01 Gy
Reference Point Session Dosage Given: 2.67 Gy
Session Number: 3

## 2021-09-30 ENCOUNTER — Ambulatory Visit
Admission: RE | Admit: 2021-09-30 | Discharge: 2021-09-30 | Disposition: A | Discharge status: Home / Self Care | Payer: Commercial Managed Care - HMO | Source: Ambulatory Visit | Attending: Radiation Oncology | Admitting: Radiation Oncology

## 2021-09-30 ENCOUNTER — Other Ambulatory Visit: Payer: Self-pay

## 2021-09-30 DIAGNOSIS — Z170 Estrogen receptor positive status [ER+]: Secondary | ICD-10-CM

## 2021-09-30 DIAGNOSIS — Z510 Encounter for antineoplastic radiation therapy: Secondary | ICD-10-CM | POA: Diagnosis not present

## 2021-09-30 LAB — RAD ONC ARIA SESSION SUMMARY
Course Elapsed Days: 5
Plan Fractions Treated to Date: 4
Plan Prescribed Dose Per Fraction: 2.67 Gy
Plan Total Fractions Prescribed: 15
Plan Total Prescribed Dose: 40.05 Gy
Reference Point Dosage Given to Date: 10.68 Gy
Reference Point Session Dosage Given: 2.67 Gy
Session Number: 4

## 2021-09-30 MED ORDER — ALRA NON-METALLIC DEODORANT (RAD-ONC)
1.0000 "application " | Freq: Once | TOPICAL | Status: AC
  Administered 2021-09-30: 1 via TOPICAL

## 2021-09-30 MED ORDER — RADIAPLEXRX EX GEL: Freq: Once | CUTANEOUS | Status: AC

## 2021-09-30 NOTE — Progress Notes (Signed)

## 2021-10-01 ENCOUNTER — Ambulatory Visit
Admission: RE | Admit: 2021-10-01 | Discharge: 2021-10-01 | Disposition: A | Discharge status: Home / Self Care | Payer: Commercial Managed Care - HMO | Source: Ambulatory Visit | Attending: Radiation Oncology | Admitting: Radiation Oncology

## 2021-10-01 ENCOUNTER — Other Ambulatory Visit: Payer: Self-pay

## 2021-10-01 ENCOUNTER — Other Ambulatory Visit (HOSPITAL_BASED_OUTPATIENT_CLINIC_OR_DEPARTMENT_OTHER): Payer: Self-pay

## 2021-10-01 DIAGNOSIS — Z510 Encounter for antineoplastic radiation therapy: Secondary | ICD-10-CM | POA: Diagnosis not present

## 2021-10-01 LAB — RAD ONC ARIA SESSION SUMMARY
Course Elapsed Days: 6
Plan Fractions Treated to Date: 5
Plan Prescribed Dose Per Fraction: 2.67 Gy
Plan Total Fractions Prescribed: 15
Plan Total Prescribed Dose: 40.05 Gy
Reference Point Dosage Given to Date: 13.35 Gy
Reference Point Session Dosage Given: 2.67 Gy
Session Number: 5

## 2021-10-01 MED ORDER — ALPRAZOLAM 0.25 MG PO TABS
ORAL_TABLET | ORAL | 0 refills | Status: DC
  Filled 2021-10-01: qty 20, 10d supply, fill #0

## 2021-10-01 MED ORDER — ESZOPICLONE 3 MG PO TABS
ORAL_TABLET | ORAL | 0 refills | Status: DC
  Filled 2021-10-01: qty 30, 30d supply, fill #0

## 2021-10-01 MED ORDER — TIZANIDINE HCL 4 MG PO TABS
ORAL_TABLET | ORAL | 1 refills | Status: AC
  Filled 2021-10-01: qty 30, 10d supply, fill #0
  Filled 2022-02-03: qty 30, 10d supply, fill #1

## 2021-10-01 MED ORDER — DICLOFENAC SODIUM 75 MG PO TBEC
DELAYED_RELEASE_TABLET | ORAL | 0 refills | Status: DC
  Filled 2021-10-01: qty 60, 30d supply, fill #0
  Filled 2021-11-20: qty 60, 30d supply, fill #1
  Filled 2022-01-02: qty 60, 30d supply, fill #2

## 2021-10-02 ENCOUNTER — Ambulatory Visit
Admission: RE | Admit: 2021-10-02 | Discharge: 2021-10-02 | Disposition: A | Discharge status: Home / Self Care | Payer: Commercial Managed Care - HMO | Source: Ambulatory Visit | Attending: Radiation Oncology | Admitting: Radiation Oncology

## 2021-10-02 ENCOUNTER — Other Ambulatory Visit: Payer: Self-pay

## 2021-10-02 DIAGNOSIS — Z510 Encounter for antineoplastic radiation therapy: Secondary | ICD-10-CM | POA: Diagnosis not present

## 2021-10-02 LAB — RAD ONC ARIA SESSION SUMMARY
Course Elapsed Days: 7
Plan Fractions Treated to Date: 6
Plan Prescribed Dose Per Fraction: 2.67 Gy
Plan Total Fractions Prescribed: 15
Plan Total Prescribed Dose: 40.05 Gy
Reference Point Dosage Given to Date: 16.02 Gy
Reference Point Session Dosage Given: 2.67 Gy
Session Number: 6

## 2021-10-03 ENCOUNTER — Ambulatory Visit
Admission: RE | Admit: 2021-10-03 | Discharge: 2021-10-03 | Disposition: A | Discharge status: Home / Self Care | Payer: Commercial Managed Care - HMO | Source: Ambulatory Visit | Attending: Radiation Oncology | Admitting: Radiation Oncology

## 2021-10-03 ENCOUNTER — Other Ambulatory Visit: Payer: Self-pay

## 2021-10-03 DIAGNOSIS — Z510 Encounter for antineoplastic radiation therapy: Secondary | ICD-10-CM | POA: Diagnosis not present

## 2021-10-03 LAB — RAD ONC ARIA SESSION SUMMARY
Course Elapsed Days: 8
Plan Fractions Treated to Date: 7
Plan Prescribed Dose Per Fraction: 2.67 Gy
Plan Total Fractions Prescribed: 15
Plan Total Prescribed Dose: 40.05 Gy
Reference Point Dosage Given to Date: 18.69 Gy
Reference Point Session Dosage Given: 2.67 Gy
Session Number: 7

## 2021-10-04 ENCOUNTER — Other Ambulatory Visit: Payer: Self-pay

## 2021-10-04 ENCOUNTER — Ambulatory Visit
Admission: RE | Admit: 2021-10-04 | Discharge: 2021-10-04 | Disposition: A | Discharge status: Home / Self Care | Payer: Commercial Managed Care - HMO | Source: Ambulatory Visit | Attending: Radiation Oncology | Admitting: Radiation Oncology

## 2021-10-04 ENCOUNTER — Other Ambulatory Visit (HOSPITAL_BASED_OUTPATIENT_CLINIC_OR_DEPARTMENT_OTHER): Payer: Self-pay

## 2021-10-04 DIAGNOSIS — Z510 Encounter for antineoplastic radiation therapy: Secondary | ICD-10-CM | POA: Diagnosis not present

## 2021-10-04 LAB — RAD ONC ARIA SESSION SUMMARY
Course Elapsed Days: 9
Plan Fractions Treated to Date: 8
Plan Prescribed Dose Per Fraction: 2.67 Gy
Plan Total Fractions Prescribed: 15
Plan Total Prescribed Dose: 40.05 Gy
Reference Point Dosage Given to Date: 21.36 Gy
Reference Point Session Dosage Given: 2.67 Gy
Session Number: 8

## 2021-10-07 ENCOUNTER — Other Ambulatory Visit: Payer: Self-pay

## 2021-10-07 ENCOUNTER — Ambulatory Visit
Admission: RE | Admit: 2021-10-07 | Discharge: 2021-10-07 | Disposition: A | Discharge status: Home / Self Care | Payer: Commercial Managed Care - HMO | Source: Ambulatory Visit | Attending: Radiation Oncology | Admitting: Radiation Oncology

## 2021-10-07 DIAGNOSIS — Z79899 Other long term (current) drug therapy: Secondary | ICD-10-CM | POA: Diagnosis not present

## 2021-10-07 DIAGNOSIS — I10 Essential (primary) hypertension: Secondary | ICD-10-CM | POA: Diagnosis not present

## 2021-10-07 DIAGNOSIS — Z510 Encounter for antineoplastic radiation therapy: Secondary | ICD-10-CM | POA: Diagnosis not present

## 2021-10-07 DIAGNOSIS — C773 Secondary and unspecified malignant neoplasm of axilla and upper limb lymph nodes: Secondary | ICD-10-CM | POA: Diagnosis not present

## 2021-10-07 DIAGNOSIS — C50412 Malignant neoplasm of upper-outer quadrant of left female breast: Secondary | ICD-10-CM | POA: Insufficient documentation

## 2021-10-07 DIAGNOSIS — E669 Obesity, unspecified: Secondary | ICD-10-CM | POA: Diagnosis not present

## 2021-10-07 DIAGNOSIS — Z170 Estrogen receptor positive status [ER+]: Secondary | ICD-10-CM | POA: Diagnosis not present

## 2021-10-07 DIAGNOSIS — E119 Type 2 diabetes mellitus without complications: Secondary | ICD-10-CM | POA: Diagnosis not present

## 2021-10-07 LAB — RAD ONC ARIA SESSION SUMMARY
Course Elapsed Days: 12
Plan Fractions Treated to Date: 9
Plan Prescribed Dose Per Fraction: 2.67 Gy
Plan Total Fractions Prescribed: 15
Plan Total Prescribed Dose: 40.05 Gy
Reference Point Dosage Given to Date: 24.03 Gy
Reference Point Session Dosage Given: 2.67 Gy
Session Number: 9

## 2021-10-08 ENCOUNTER — Other Ambulatory Visit: Payer: Self-pay

## 2021-10-08 ENCOUNTER — Ambulatory Visit
Admission: RE | Admit: 2021-10-08 | Discharge: 2021-10-08 | Disposition: A | Discharge status: Home / Self Care | Payer: Commercial Managed Care - HMO | Source: Ambulatory Visit | Attending: Radiation Oncology | Admitting: Radiation Oncology

## 2021-10-08 DIAGNOSIS — Z510 Encounter for antineoplastic radiation therapy: Secondary | ICD-10-CM | POA: Diagnosis not present

## 2021-10-08 LAB — RAD ONC ARIA SESSION SUMMARY
Course Elapsed Days: 13
Plan Fractions Treated to Date: 10
Plan Prescribed Dose Per Fraction: 2.67 Gy
Plan Total Fractions Prescribed: 15
Plan Total Prescribed Dose: 40.05 Gy
Reference Point Dosage Given to Date: 26.7 Gy
Reference Point Session Dosage Given: 2.67 Gy
Session Number: 10

## 2021-10-09 ENCOUNTER — Ambulatory Visit
Admission: RE | Admit: 2021-10-09 | Discharge: 2021-10-09 | Disposition: A | Discharge status: Home / Self Care | Payer: Commercial Managed Care - HMO | Source: Ambulatory Visit | Attending: Radiation Oncology | Admitting: Radiation Oncology

## 2021-10-09 ENCOUNTER — Other Ambulatory Visit: Payer: Self-pay

## 2021-10-09 DIAGNOSIS — Z510 Encounter for antineoplastic radiation therapy: Secondary | ICD-10-CM | POA: Diagnosis not present

## 2021-10-09 LAB — RAD ONC ARIA SESSION SUMMARY
Course Elapsed Days: 14
Plan Fractions Treated to Date: 11
Plan Prescribed Dose Per Fraction: 2.67 Gy
Plan Total Fractions Prescribed: 15
Plan Total Prescribed Dose: 40.05 Gy
Reference Point Dosage Given to Date: 29.37 Gy
Reference Point Session Dosage Given: 2.67 Gy
Session Number: 11

## 2021-10-10 ENCOUNTER — Ambulatory Visit
Admission: RE | Admit: 2021-10-10 | Discharge: 2021-10-10 | Disposition: A | Discharge status: Home / Self Care | Payer: Commercial Managed Care - HMO | Source: Ambulatory Visit | Attending: Radiation Oncology | Admitting: Radiation Oncology

## 2021-10-10 ENCOUNTER — Other Ambulatory Visit: Payer: Self-pay

## 2021-10-10 DIAGNOSIS — Z510 Encounter for antineoplastic radiation therapy: Secondary | ICD-10-CM | POA: Diagnosis not present

## 2021-10-10 LAB — RAD ONC ARIA SESSION SUMMARY
Course Elapsed Days: 15
Plan Fractions Treated to Date: 12
Plan Prescribed Dose Per Fraction: 2.67 Gy
Plan Total Fractions Prescribed: 15
Plan Total Prescribed Dose: 40.05 Gy
Reference Point Dosage Given to Date: 32.04 Gy
Reference Point Session Dosage Given: 2.67 Gy
Session Number: 12

## 2021-10-11 ENCOUNTER — Other Ambulatory Visit: Payer: Self-pay

## 2021-10-11 ENCOUNTER — Ambulatory Visit
Admission: RE | Admit: 2021-10-11 | Discharge: 2021-10-11 | Disposition: A | Discharge status: Home / Self Care | Payer: Commercial Managed Care - HMO | Source: Ambulatory Visit | Attending: Radiation Oncology | Admitting: Radiation Oncology

## 2021-10-11 DIAGNOSIS — Z510 Encounter for antineoplastic radiation therapy: Secondary | ICD-10-CM | POA: Diagnosis not present

## 2021-10-11 LAB — RAD ONC ARIA SESSION SUMMARY
Course Elapsed Days: 16
Plan Fractions Treated to Date: 13
Plan Prescribed Dose Per Fraction: 2.67 Gy
Plan Total Fractions Prescribed: 15
Plan Total Prescribed Dose: 40.05 Gy
Reference Point Dosage Given to Date: 34.71 Gy
Reference Point Session Dosage Given: 2.67 Gy
Session Number: 13

## 2021-10-14 ENCOUNTER — Ambulatory Visit: Payer: Commercial Managed Care - HMO | Admitting: Radiation Oncology

## 2021-10-14 ENCOUNTER — Other Ambulatory Visit: Payer: Self-pay

## 2021-10-14 ENCOUNTER — Ambulatory Visit
Admission: RE | Admit: 2021-10-14 | Discharge: 2021-10-14 | Disposition: A | Discharge status: Home / Self Care | Payer: Commercial Managed Care - HMO | Source: Ambulatory Visit | Attending: Radiation Oncology | Admitting: Radiation Oncology

## 2021-10-14 DIAGNOSIS — Z510 Encounter for antineoplastic radiation therapy: Secondary | ICD-10-CM | POA: Diagnosis not present

## 2021-10-14 LAB — RAD ONC ARIA SESSION SUMMARY
Course Elapsed Days: 19
Plan Fractions Treated to Date: 14
Plan Prescribed Dose Per Fraction: 2.67 Gy
Plan Total Fractions Prescribed: 15
Plan Total Prescribed Dose: 40.05 Gy
Reference Point Dosage Given to Date: 37.38 Gy
Reference Point Session Dosage Given: 2.67 Gy
Session Number: 14

## 2021-10-15 ENCOUNTER — Other Ambulatory Visit: Payer: Self-pay

## 2021-10-15 ENCOUNTER — Ambulatory Visit
Admission: RE | Admit: 2021-10-15 | Discharge: 2021-10-15 | Disposition: A | Discharge status: Home / Self Care | Payer: Commercial Managed Care - HMO | Source: Ambulatory Visit | Attending: Radiation Oncology | Admitting: Radiation Oncology

## 2021-10-15 DIAGNOSIS — Z510 Encounter for antineoplastic radiation therapy: Secondary | ICD-10-CM | POA: Diagnosis not present

## 2021-10-15 LAB — RAD ONC ARIA SESSION SUMMARY
Course Elapsed Days: 20
Plan Fractions Treated to Date: 15
Plan Prescribed Dose Per Fraction: 2.67 Gy
Plan Total Fractions Prescribed: 15
Plan Total Prescribed Dose: 40.05 Gy
Reference Point Dosage Given to Date: 40.05 Gy
Reference Point Session Dosage Given: 2.67 Gy
Session Number: 15

## 2021-10-16 ENCOUNTER — Other Ambulatory Visit: Payer: Self-pay

## 2021-10-16 ENCOUNTER — Ambulatory Visit
Admission: RE | Admit: 2021-10-16 | Discharge: 2021-10-16 | Disposition: A | Discharge status: Home / Self Care | Payer: Commercial Managed Care - HMO | Source: Ambulatory Visit | Attending: Radiation Oncology | Admitting: Radiation Oncology

## 2021-10-16 DIAGNOSIS — Z510 Encounter for antineoplastic radiation therapy: Secondary | ICD-10-CM | POA: Diagnosis not present

## 2021-10-16 LAB — RAD ONC ARIA SESSION SUMMARY
Course Elapsed Days: 21
Plan Fractions Treated to Date: 1
Plan Prescribed Dose Per Fraction: 2 Gy
Plan Total Fractions Prescribed: 5
Plan Total Prescribed Dose: 10 Gy
Reference Point Dosage Given to Date: 42.05 Gy
Reference Point Session Dosage Given: 2 Gy
Session Number: 16

## 2021-10-17 ENCOUNTER — Ambulatory Visit
Admission: RE | Admit: 2021-10-17 | Discharge: 2021-10-17 | Disposition: A | Discharge status: Home / Self Care | Payer: Commercial Managed Care - HMO | Source: Ambulatory Visit | Attending: Radiation Oncology | Admitting: Radiation Oncology

## 2021-10-17 ENCOUNTER — Other Ambulatory Visit: Payer: Self-pay

## 2021-10-17 DIAGNOSIS — Z510 Encounter for antineoplastic radiation therapy: Secondary | ICD-10-CM | POA: Diagnosis not present

## 2021-10-17 LAB — RAD ONC ARIA SESSION SUMMARY
Course Elapsed Days: 22
Plan Fractions Treated to Date: 2
Plan Prescribed Dose Per Fraction: 2 Gy
Plan Total Fractions Prescribed: 5
Plan Total Prescribed Dose: 10 Gy
Reference Point Dosage Given to Date: 44.05 Gy
Reference Point Session Dosage Given: 2 Gy
Session Number: 17

## 2021-10-18 ENCOUNTER — Other Ambulatory Visit: Payer: Self-pay

## 2021-10-18 ENCOUNTER — Ambulatory Visit
Admission: RE | Admit: 2021-10-18 | Discharge: 2021-10-18 | Disposition: A | Discharge status: Home / Self Care | Payer: Commercial Managed Care - HMO | Source: Ambulatory Visit | Attending: Radiation Oncology | Admitting: Radiation Oncology

## 2021-10-18 DIAGNOSIS — Z510 Encounter for antineoplastic radiation therapy: Secondary | ICD-10-CM | POA: Diagnosis not present

## 2021-10-18 LAB — RAD ONC ARIA SESSION SUMMARY
Course Elapsed Days: 23
Plan Fractions Treated to Date: 3
Plan Prescribed Dose Per Fraction: 2 Gy
Plan Total Fractions Prescribed: 5
Plan Total Prescribed Dose: 10 Gy
Reference Point Dosage Given to Date: 46.05 Gy
Reference Point Session Dosage Given: 2 Gy
Session Number: 18

## 2021-10-21 ENCOUNTER — Encounter: Payer: Self-pay | Admitting: *Deleted

## 2021-10-21 ENCOUNTER — Other Ambulatory Visit: Payer: Self-pay

## 2021-10-21 ENCOUNTER — Other Ambulatory Visit (HOSPITAL_BASED_OUTPATIENT_CLINIC_OR_DEPARTMENT_OTHER): Payer: Self-pay

## 2021-10-21 ENCOUNTER — Other Ambulatory Visit: Payer: Self-pay | Admitting: Family Medicine

## 2021-10-21 ENCOUNTER — Ambulatory Visit
Admission: RE | Admit: 2021-10-21 | Discharge: 2021-10-21 | Disposition: A | Discharge status: Home / Self Care | Payer: Commercial Managed Care - HMO | Source: Ambulatory Visit | Attending: Radiation Oncology | Admitting: Radiation Oncology

## 2021-10-21 ENCOUNTER — Encounter: Payer: Self-pay | Admitting: Hematology

## 2021-10-21 ENCOUNTER — Inpatient Hospital Stay: Payer: Commercial Managed Care - HMO | Attending: Hematology | Admitting: Hematology

## 2021-10-21 VITALS — BP 131/51 | HR 75 | Temp 97.7°F | Resp 20 | Ht 66.0 in | Wt 263.3 lb

## 2021-10-21 DIAGNOSIS — C50412 Malignant neoplasm of upper-outer quadrant of left female breast: Secondary | ICD-10-CM | POA: Diagnosis not present

## 2021-10-21 DIAGNOSIS — Z510 Encounter for antineoplastic radiation therapy: Secondary | ICD-10-CM | POA: Diagnosis not present

## 2021-10-21 DIAGNOSIS — C773 Secondary and unspecified malignant neoplasm of axilla and upper limb lymph nodes: Secondary | ICD-10-CM | POA: Insufficient documentation

## 2021-10-21 DIAGNOSIS — Z79899 Other long term (current) drug therapy: Secondary | ICD-10-CM | POA: Insufficient documentation

## 2021-10-21 DIAGNOSIS — E669 Obesity, unspecified: Secondary | ICD-10-CM | POA: Insufficient documentation

## 2021-10-21 DIAGNOSIS — E2839 Other primary ovarian failure: Secondary | ICD-10-CM | POA: Diagnosis not present

## 2021-10-21 DIAGNOSIS — I10 Essential (primary) hypertension: Secondary | ICD-10-CM | POA: Insufficient documentation

## 2021-10-21 DIAGNOSIS — Z170 Estrogen receptor positive status [ER+]: Secondary | ICD-10-CM | POA: Insufficient documentation

## 2021-10-21 DIAGNOSIS — E119 Type 2 diabetes mellitus without complications: Secondary | ICD-10-CM | POA: Insufficient documentation

## 2021-10-21 LAB — RAD ONC ARIA SESSION SUMMARY
Course Elapsed Days: 26
Plan Fractions Treated to Date: 4
Plan Prescribed Dose Per Fraction: 2 Gy
Plan Total Fractions Prescribed: 5
Plan Total Prescribed Dose: 10 Gy
Reference Point Dosage Given to Date: 48.05 Gy
Reference Point Session Dosage Given: 2 Gy
Session Number: 19

## 2021-10-21 MED ORDER — RADIAPLEXRX EX GEL: Freq: Once | CUTANEOUS | Status: AC

## 2021-10-21 NOTE — Progress Notes (Signed)
?[place]   ?[phone] [phone]   ?Clinic Follow up Note  ? ?Patient Care Team: ?Collene Leyden, MD as PCP - General (Family Medicine) ?Mauro Kaufmann, RN as Oncology Nurse Navigator ?Rockwell Germany, RN as Oncology Nurse Navigator ?Stark Klein, MD as Consulting Physician (General Surgery) ?Truitt Merle, MD as Consulting Physician (Hematology) ?Eppie Gibson, MD as Attending Physician (Radiation Oncology) ? ?Date of Service:  10/21/2021 ? ?CHIEF COMPLAINT: f/u of left breast cancer ? ?CURRENT THERAPY:  ?To start antiestrogen therapy ? ?ASSESSMENT & PLAN:  ?Lisa [name] is a 61 y.o. post-menopausal female with  ? ?1. Malignant neoplasm of upper-outer quadrant of left breast, invasive ductal carcinoma, stage IA, p(T1c, N1a ) cM0, ER+/PR+/HER2-, Grade 2, Oncotype RS 15 ?-presented with palpable left breast mass. S/p left lumpectomy on 08/23/21 with Dr. Barry Dienes. 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 ?-Oncotype RS of 15, low risk. ?-she is currently receiving adjuvant radiation under Dr. Isidore Moos, scheduled to finish tomorrow, 10/22/21. ?-Giving the strong ER and PR expression in her postmenopausal status, I recommend adjuvant endocrine therapy with aromatase inhibitor for a total of 5-7 years to reduce the risk of cancer recurrence. She expressed concern with tamoxifen given her sister died from blood clots and bleeding when she was on tamoxifen.  Tamoxifen also has interaction with her antidepressants.  I would recommend exemestane. We will wait for her DEXA results before making a final decision. Potential benefits and side effects of AI were discussed with patient and she will consider.  I strongly encouraged her to lose weight which has been difficult for her.  Given her multiple medical comorbidities, especially obesity, if she does not tolerate AI, I will take her off. ?-We also discussed the breast cancer surveillance  after her surgery. She will continue annual screening mammogram, self exam, and a routine office visit with lab and exam with [place]. ? ?2. Genetics ?-she has a family history of breast cancer in two sisters, a paternal half-aunt, and her paternal grandmother.  ?-per pt, her sisters had negative genetic testing. ?-testing on 08/14/21 was negative, with a VUS in BRIP1 gene. ? ?3. Bone Health ?-she has never had a bone density screening. Plan to obtain baseline soon, I ordered today. ?  ?4. DM, depression, HTN, obesity, arthritis  ?-f/u with PC  ?-she is interested in weight loss but has been unable to lose on her own. I will refer her to our weight management clinic.  ? ?  ?PLAN:  ?-DEXA to be done soon ? -I will call her with results ?-plan to start exemestane after DEXA, if she does not have severe osteoporosis  ?-survivorship in 3 months ?-lab and f/u in 6 months ?-Refer to weight management clinic ? ? ?No problem-specific Assessment & Plan notes found for this encounter. ? ? ?SUMMARY OF ONCOLOGIC HISTORY: ?Oncology History Overview Note  ? Cancer Staging  ?Malignant neoplasm of upper-outer quadrant of left breast in female, estrogen receptor positive ([place]) ?Staging form: Breast, AJCC 8th Edition ?- Clinical stage from 08/06/2021: Stage IA (cT1c, cN0, cM0, G2, ER+, PR+, HER2-) - Signed by Truitt Merle, MD on 08/13/2021 ?- Pathologic stage from 08/23/2021: Stage IA (pT1c, pN1a, cM0, G2, ER+, PR+, HER2-, Oncotype DX score: 15) - Signed by Truitt Merle, MD on 10/21/2021  ? ?  ?Malignant neoplasm of upper-outer quadrant of left breast in female, estrogen receptor positive ([place])  ?07/27/2021 Mammogram  ? CLINICAL DATA:  Palpable lump in the left breast. ?  ?EXAM: ?DIGITAL DIAGNOSTIC BILATERAL MAMMOGRAM WITH TOMOSYNTHESIS AND CAD; ?ULTRASOUND LEFT BREAST LIMITED ? ?IMPRESSION: ?Highly suspicious left breast mass. No left axillary adenopathy. No ?other abnormalities. ?  ?08/06/2021 Cancer Staging  ? Staging form: Breast, AJCC 8th  Edition ?- Clinical stage from 08/06/2021: Stage IA (cT1c, cN0, cM0, G2, ER+, PR+, HER2-) - Signed by Truitt Merle, MD on 08/13/2021 ?Stage prefix: Initial diagnosis ?Histologic grading system: 3 grade system ? ?  ?08/06/2021 Initial Biopsy  ? Diagnosis ?Breast, left, needle core biopsy, Retroareolar 1.6cm left mass 3:00 (ribbon clip) ?- INVASIVE MAMMARY CARCINOMA ?- DUCTAL CARCINOMA IN SITU ?- SEE COMMENT ?Microscopic Comment ?The biopsy material shows an infiltrative proliferation of cells arranged linearly and in small clusters. Based on the biopsy, the carcinoma appears Nottingham grade 2 of 3 and measures 1.5 cm in greatest linear extent. ? ?E-cadherin is POSITIVE supporting a ductal origin. ? ?PROGNOSTIC INDICATORS ?Results: ?The tumor cells are EQUIVOCAL for Her2 (2+). Her2 by FISH will be performed and the results reported separately. ?Estrogen Receptor: 100%, POSITIVE, STRONG-MODERATE STAINING INTENSITY ?Progesterone Receptor: 100%, POSITIVE, STRONG STAINING INTENSITY ?Proliferation Marker Ki67: 5% ? ?FLUORESCENCE IN-SITU HYBRIDIZATION ?Results: ?GROUP 5: HER2 **NEGATIVE** ?  ?08/12/2021 Initial Diagnosis  ? Malignant neoplasm of upper-outer quadrant of left breast in female, estrogen receptor positive ([place]) ? ?  ? Genetic Testing  ? [facility] CustomNext Panel was Negative. 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 Report date is 08/28/2021. ? ?The CustomNext-Cancer+RNAinsight panel offered by [employer] [facility] includes sequencing and rearrangement analysis for the following 47 genes:  APC, ATM, AXIN2, BARD1, BMPR1A, BRCA1, BRCA2, BRIP1, CDH1, CDK4, CDKN2A, CHEK2, CTNNA1, DICER1, EPCAM, GREM1, HOXB13, KIT, MEN1, MLH1, MSH2, MSH3, MSH6, MUTYH, NBN, NF1, NTHL1, PALB2, PDGFRA, PMS2, POLD1, POLE, PTEN, RAD50, RAD51C, RAD51D, SDHA, SDHB, SDHC, SDHD, SMAD4, SMARCA4, STK11, TP53, TSC1, TSC2, and VHL.  RNA data is routinely analyzed for use in variant interpretation for all genes. ?   ?08/23/2021 Cancer Staging  ? Staging form: Breast, AJCC 8th Edition ?- Pathologic stage from 08/23/2021: Stage IA (pT1c, pN1a, cM0, G2, ER+, PR+, HER2-, Oncotype DX score: 15) - Signed by Truitt Merle, MD on 10/21/2021 ?Stage prefix: Initial diagnosis ?Multigene prognostic tests performed: Oncotype DX ?Recurrence score range: Greater than or equal to 11 ?Histologic grading system: 3 grade system ?Residual tumor (R): R0 - None ? ?  ?08/23/2021 Definitive Surgery  ? FINAL MICROSCOPIC DIAGNOSIS:  ? ?A. BREAST, LEFT, LUMPECTOMY:  ?- Invasive and in situ ductal carcinoma, 1.9 cm.  ?- Invasive carcinoma 0.2 cm from posterior margin and 0.3 cm from anterior margin.  ?- DCIS 0.1 cm from medial margin.  ?- Biopsy site and biopsy clip.  ?- See oncology table.  ? ?B. LYMPH NODE, LEFT AXILLARY #1, SENTINEL, EXCISION:  ?- One lymph node negative for metastatic carcinoma (0/1).  ? ?C. LYMPH NODE, LEFT AXILLARY #2, SENTINEL, EXCISION:  ?- Metastatic carcinoma in one lymph node (1/1).  ?- Metastasis is 0.4 cm.  ? ?D. LYMPH NODE, LEFT AXILLARY #3, SENTINEL, EXCISION:  ?- One lymph node negative for metastatic carcinoma (0/1).  ? ?E. LYMPH NODE, LEFT AXILLARY #4, SENTINEL, EXCISION:  ?- One lymph node negative for metastatic carcinoma (0/1).  ? ?F. LYMPH NODE, LEFT AXILLARY #5, SENTINEL, EXCISION:  ?- One lymph node negative for metastatic carcinoma (0/1).  ?  ?08/23/2021 Miscellaneous  ? Oncotype DX was obtained on the final surgical sample and the recurrence score  of 15 predicts a risk of recurrence outside the breast over the next 9 years of 14%, if the patient's only systemic therapy is an antiestrogen for 5 years.  It also predicts no apparent benefit from chemotherapy.  ?  ? ? ? ?INTERVAL HISTORY:  ?Lisa [name] is here for a follow up of breast cancer. She was last seen by me on 08/14/21 in consultation. She presents to the clinic alone. ?She reports she is doing well from a cancer standpoint but is having a difficult time with  her home life. She tells me she lives with her daughter and her family, and she tells me they don't get along. 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 ?  ?All

## 2021-10-22 ENCOUNTER — Other Ambulatory Visit: Payer: Self-pay

## 2021-10-22 ENCOUNTER — Ambulatory Visit
Admission: RE | Admit: 2021-10-22 | Discharge: 2021-10-22 | Disposition: A | Discharge status: Home / Self Care | Payer: Commercial Managed Care - HMO | Source: Ambulatory Visit | Attending: Radiation Oncology | Admitting: Radiation Oncology

## 2021-10-22 ENCOUNTER — Other Ambulatory Visit (HOSPITAL_BASED_OUTPATIENT_CLINIC_OR_DEPARTMENT_OTHER): Payer: Self-pay

## 2021-10-22 ENCOUNTER — Encounter: Payer: Self-pay | Admitting: Radiation Oncology

## 2021-10-22 DIAGNOSIS — Z510 Encounter for antineoplastic radiation therapy: Secondary | ICD-10-CM | POA: Diagnosis not present

## 2021-10-22 LAB — RAD ONC ARIA SESSION SUMMARY
Course Elapsed Days: 27
Plan Fractions Treated to Date: 5
Plan Prescribed Dose Per Fraction: 2 Gy
Plan Total Fractions Prescribed: 5
Plan Total Prescribed Dose: 10 Gy
Reference Point Dosage Given to Date: 50.05 Gy
Reference Point Session Dosage Given: 2 Gy
Session Number: 20

## 2021-10-22 MED ORDER — INSULIN PEN NEEDLE 31G X 8 MM MISC
2 refills | Status: AC
  Filled 2021-10-22: qty 100, 30d supply, fill #0
  Filled 2022-02-17: qty 100, 30d supply, fill #1
  Filled 2022-07-03: qty 100, 30d supply, fill #2

## 2021-10-22 MED ORDER — SPIRONOLACTONE 25 MG PO TABS
25.0000 mg | ORAL_TABLET | Freq: Every day | ORAL | 2 refills | Status: DC
  Filled 2021-10-22: qty 30, 30d supply, fill #0
  Filled 2021-11-20: qty 30, 30d supply, fill #1
  Filled 2021-12-23: qty 30, 30d supply, fill #2

## 2021-10-22 MED ORDER — ULTICARE SHORT PEN NEEDLES 31G X 8 MM MISC
2 refills | Status: DC
  Filled 2021-10-22: qty 100, 90d supply, fill #0

## 2021-10-24 ENCOUNTER — Telehealth: Payer: Self-pay | Admitting: Hematology

## 2021-10-24 NOTE — Telephone Encounter (Signed)
Left message with follow-up appointments per 5/15 los.

## 2021-10-31 ENCOUNTER — Ambulatory Visit
Admission: RE | Admit: 2021-10-31 | Discharge: 2021-10-31 | Disposition: A | Discharge status: Home / Self Care | Payer: Commercial Managed Care - HMO | Source: Ambulatory Visit | Attending: Hematology | Admitting: Hematology

## 2021-10-31 DIAGNOSIS — E2839 Other primary ovarian failure: Secondary | ICD-10-CM

## 2021-11-01 ENCOUNTER — Other Ambulatory Visit: Payer: Self-pay | Admitting: Hematology

## 2021-11-01 MED ORDER — EXEMESTANE 25 MG PO TABS
25.0000 mg | ORAL_TABLET | Freq: Every day | ORAL | 3 refills | Status: DC
  Filled 2021-11-01: qty 30, 30d supply, fill #0

## 2021-11-05 ENCOUNTER — Other Ambulatory Visit (HOSPITAL_BASED_OUTPATIENT_CLINIC_OR_DEPARTMENT_OTHER): Payer: Self-pay

## 2021-11-05 ENCOUNTER — Encounter: Payer: Self-pay | Admitting: Infectious Disease

## 2021-11-05 MED ORDER — EZETIMIBE 10 MG PO TABS
10.0000 mg | ORAL_TABLET | Freq: Every day | ORAL | 2 refills | Status: DC
  Filled 2021-11-05: qty 30, 30d supply, fill #0
  Filled 2022-01-02: qty 30, 30d supply, fill #1
  Filled 2022-02-06: qty 30, 30d supply, fill #2
  Filled 2022-03-18: qty 30, 30d supply, fill #3
  Filled 2022-05-05: qty 30, 30d supply, fill #4
  Filled 2022-06-03: qty 30, 30d supply, fill #5

## 2021-11-06 ENCOUNTER — Other Ambulatory Visit (HOSPITAL_BASED_OUTPATIENT_CLINIC_OR_DEPARTMENT_OTHER): Payer: Self-pay

## 2021-11-06 MED ORDER — DOXYCYCLINE MONOHYDRATE 100 MG PO CAPS
100.0000 mg | ORAL_CAPSULE | Freq: Two times a day (BID) | ORAL | 0 refills | Status: DC
  Filled 2021-11-06: qty 14, 7d supply, fill #0

## 2021-11-06 MED ORDER — FLUCONAZOLE 150 MG PO TABS
ORAL_TABLET | ORAL | 0 refills | Status: DC
  Filled 2021-11-06: qty 1, 1d supply, fill #0

## 2021-11-06 MED ORDER — OXYCODONE HCL 5 MG PO TABS
ORAL_TABLET | ORAL | 0 refills | Status: DC
  Filled 2021-11-06: qty 8, 2d supply, fill #0

## 2021-11-11 ENCOUNTER — Other Ambulatory Visit (HOSPITAL_BASED_OUTPATIENT_CLINIC_OR_DEPARTMENT_OTHER): Payer: Self-pay

## 2021-11-12 ENCOUNTER — Encounter: Payer: Self-pay | Admitting: Infectious Disease

## 2021-11-12 NOTE — Telephone Encounter (Signed)
Was going to add augmentin but appears she also has been on that besides doxy   Please check and if she can finish 2 weeks doxy/augmentin after the breast aspiration    Thank you

## 2021-11-13 ENCOUNTER — Telehealth: Payer: Self-pay

## 2021-11-13 ENCOUNTER — Other Ambulatory Visit: Payer: Self-pay

## 2021-11-13 ENCOUNTER — Other Ambulatory Visit (HOSPITAL_BASED_OUTPATIENT_CLINIC_OR_DEPARTMENT_OTHER): Payer: Self-pay

## 2021-11-13 MED ORDER — ESZOPICLONE 3 MG PO TABS
3.0000 mg | ORAL_TABLET | Freq: Every day | ORAL | 0 refills | Status: DC
  Filled 2021-11-13: qty 30, 30d supply, fill #0

## 2021-11-13 MED ORDER — DOXYCYCLINE MONOHYDRATE 100 MG PO CAPS
100.0000 mg | ORAL_CAPSULE | Freq: Two times a day (BID) | ORAL | 0 refills | Status: DC
  Filled 2021-11-13: qty 14, 7d supply, fill #0

## 2021-11-13 NOTE — Telephone Encounter (Signed)
Thank you :)

## 2021-11-13 NOTE — Telephone Encounter (Signed)
Error

## 2021-11-13 NOTE — Telephone Encounter (Signed)
Called patient to communicate message from Dr. Gale Journey regarding antibiotics. No answer. Left HIPAA-compliant voicemail requesting call back. Detailed MyChart message sent.  Binnie Kand, RN

## 2021-11-13 NOTE — Telephone Encounter (Signed)
Patient aware. Rx for Doxycycline sent to pharmacy. Patient stated that she is taking Doxy and Augmentin as prescribed. Patient will call back when her husband is home to schedule follow up with a provider in 1-2 weeks.    [place], CMA

## 2021-11-14 ENCOUNTER — Other Ambulatory Visit (HOSPITAL_BASED_OUTPATIENT_CLINIC_OR_DEPARTMENT_OTHER): Payer: Self-pay

## 2021-11-15 NOTE — Progress Notes (Signed)
                                                                                                                                                             Patient Name: Lisa [name] MRN: [id number] DOB: 08/11/1960 Referring Physician: Truitt Merle (Profile Not Attached) Date of Service: 10/22/2021 [facility], [place]                                                        End Of Treatment Note  Cancer Staging  Malignant neoplasm of upper-outer quadrant of left breast in female, estrogen receptor positive ([place]) Staging form: Breast, AJCC 8th Edition - Clinical stage from 08/06/2021: Stage IA (cT1c, cN0, cM0, G2, ER+, PR+, HER2-) - Signed by Truitt Merle, MD on 08/13/2021 Stage prefix: Initial diagnosis Histologic grading system: 3 grade system - Pathologic stage from 08/23/2021: Stage IA (pT1c, pN1a, cM0, G2, ER+, PR+, HER2-, Oncotype DX score: 15) - Signed by Truitt Merle, MD on 10/21/2021 Stage prefix: Initial diagnosis Multigene prognostic tests performed: Oncotype DX Recurrence score range: Greater than or equal to 11 Histologic grading system: 3 grade system Residual tumor (R): R0 - None  Diagnoses: C50.412-Malignant neoplasm of upper-outer quadrant of left female breast   Intent: Curative  Radiation Treatment Dates: 09/25/2021 through 10/22/2021 Site Technique Total Dose (Gy) Dose per Fx (Gy) Completed Fx Beam Energies  Breast, Left: Breast_L_Axilla 3D 40.05/40.05 2.67 15/15 10X, 15X  Breast, Left: Breast_L_Bst 3D 10/10 2 5/5 6X, 15X   Narrative: The patient tolerated radiation therapy relatively well.   Plan: The patient will follow-up with radiation oncology in 1 mo. -----------------------------------  Eppie Gibson, MD

## 2021-11-20 ENCOUNTER — Other Ambulatory Visit (HOSPITAL_BASED_OUTPATIENT_CLINIC_OR_DEPARTMENT_OTHER): Payer: Self-pay

## 2021-11-28 ENCOUNTER — Telehealth: Payer: Self-pay

## 2021-11-28 NOTE — Telephone Encounter (Signed)
I called the patient today about her upcoming follow-up appointment in radiation oncology.   Given the state of the COVID-19 pandemic, concerning case numbers in our community, and guidance from [facility], I offered a phone assessment with the patient to determine if coming to the clinic was necessary. She accepted.  She reports her fatigue has improved and she has "been able to do more". She reports she experienced mild peeling a couple weeks after finishing radiation, but her skin within the treatment field is now intact. Her main concern currently is a hematoma that developed in the lumpectomy cavity. She reports the area became warm/red, and her nipple leaked a serous like fluid. She saw her surgeon Dr. Barry Dienes on 11/11/21 who aspirated ~1104m of cloudy/bloody fluid from the hard mass. She has taken 2 courses of doxycycline as well as a course of doxycycline, and confirmed that her infectious disease doctor (Dr. VTommy Medalis aware of the development). She is scheduled to see Dr. BBarry Dienesagain tomorrow morning and Dr. VTommy Medalnext Monday. I encouraged her to keep her oncology team updated, and to please call me directly if she had any questions/concerns for Dr. SIsidore Moos  Continue follow-up with medical oncology - [facility]  I explained that yearly mammograms are important for patients with intact breast tissue, and physical exams are important after mastectomy for patients that cannot undergo mammography.  I encouraged her to call if she had further questions or concerns about her healing. Otherwise, she will follow-up PRN in radiation oncology. Patient is pleased with this plan, and we will cancel her upcoming follow-up to reduce the risk of COVID-19 transmission.

## 2021-11-29 ENCOUNTER — Telehealth: Payer: Self-pay

## 2021-11-29 ENCOUNTER — Ambulatory Visit: Payer: Commercial Managed Care - HMO | Admitting: Radiation Oncology

## 2021-12-02 ENCOUNTER — Ambulatory Visit (INDEPENDENT_AMBULATORY_CARE_PROVIDER_SITE_OTHER): Payer: Commercial Managed Care - HMO | Admitting: Infectious Disease

## 2021-12-02 ENCOUNTER — Other Ambulatory Visit: Payer: Self-pay

## 2021-12-02 ENCOUNTER — Encounter: Payer: Self-pay | Admitting: Infectious Disease

## 2021-12-02 VITALS — BP 134/71 | HR 100 | Temp 97.9°F | Ht 66.0 in | Wt 261.0 lb

## 2021-12-02 DIAGNOSIS — G009 Bacterial meningitis, unspecified: Secondary | ICD-10-CM | POA: Diagnosis not present

## 2021-12-02 DIAGNOSIS — G9600 Cerebrospinal fluid leak, unspecified: Secondary | ICD-10-CM | POA: Diagnosis not present

## 2021-12-02 DIAGNOSIS — J9601 Acute respiratory failure with hypoxia: Secondary | ICD-10-CM

## 2021-12-02 DIAGNOSIS — C50412 Malignant neoplasm of upper-outer quadrant of left female breast: Secondary | ICD-10-CM

## 2021-12-02 DIAGNOSIS — A413 Sepsis due to Hemophilus influenzae: Secondary | ICD-10-CM

## 2021-12-02 DIAGNOSIS — R6521 Severe sepsis with septic shock: Secondary | ICD-10-CM

## 2021-12-02 DIAGNOSIS — N6489 Other specified disorders of breast: Secondary | ICD-10-CM

## 2021-12-02 DIAGNOSIS — Z170 Estrogen receptor positive status [ER+]: Secondary | ICD-10-CM

## 2021-12-02 HISTORY — DX: Other specified disorders of breast: N64.89

## 2021-12-03 ENCOUNTER — Other Ambulatory Visit (HOSPITAL_BASED_OUTPATIENT_CLINIC_OR_DEPARTMENT_OTHER): Payer: Self-pay

## 2021-12-09 ENCOUNTER — Ambulatory Visit: Payer: Commercial Managed Care - HMO

## 2021-12-23 ENCOUNTER — Other Ambulatory Visit (HOSPITAL_BASED_OUTPATIENT_CLINIC_OR_DEPARTMENT_OTHER): Payer: Self-pay

## 2021-12-23 MED ORDER — BUPROPION HCL ER (XL) 300 MG PO TB24
300.0000 mg | ORAL_TABLET | Freq: Every morning | ORAL | 3 refills | Status: DC
  Filled 2021-12-23: qty 30, 30d supply, fill #0
  Filled 2022-01-21: qty 30, 30d supply, fill #1

## 2021-12-23 MED ORDER — OMEPRAZOLE 20 MG PO CPDR
DELAYED_RELEASE_CAPSULE | ORAL | 2 refills | Status: DC
  Filled 2021-12-23: qty 60, 30d supply, fill #0
  Filled 2022-01-21: qty 60, 30d supply, fill #1

## 2021-12-23 MED ORDER — ESZOPICLONE 3 MG PO TABS
ORAL_TABLET | ORAL | 0 refills | Status: DC
  Filled 2021-12-23: qty 30, 30d supply, fill #0

## 2021-12-23 MED ORDER — SERTRALINE HCL 100 MG PO TABS
ORAL_TABLET | ORAL | 3 refills | Status: AC
Start: 2021-12-23 — End: ?
  Filled 2021-12-23: qty 60, 30d supply, fill #0
  Filled 2022-01-21: qty 60, 30d supply, fill #1
  Filled 2022-02-26: qty 60, 30d supply, fill #2
  Filled 2022-04-03: qty 60, 30d supply, fill #3
  Filled 2022-05-05: qty 60, 30d supply, fill #4
  Filled 2022-06-03: qty 60, 30d supply, fill #5
  Filled 2022-07-03: qty 60, 30d supply, fill #6
  Filled 2022-08-03: qty 60, 30d supply, fill #7
  Filled 2022-09-03: qty 60, 30d supply, fill #8
  Filled 2022-10-06: qty 60, 30d supply, fill #9
  Filled 2022-11-04: qty 60, 30d supply, fill #10
  Filled 2022-12-01: qty 60, 30d supply, fill #11

## 2021-12-23 MED ORDER — ALPRAZOLAM 0.25 MG PO TABS
ORAL_TABLET | ORAL | 0 refills | Status: DC
  Filled 2021-12-23: qty 20, 20d supply, fill #0

## 2021-12-23 MED ORDER — METFORMIN HCL 500 MG PO TABS
ORAL_TABLET | ORAL | 3 refills | Status: AC
  Filled 2021-12-23: qty 90, 30d supply, fill #0
  Filled 2022-01-21: qty 90, 30d supply, fill #1
  Filled 2022-02-26: qty 90, 30d supply, fill #2
  Filled 2022-03-30: qty 90, 30d supply, fill #3
  Filled 2022-05-05: qty 90, 30d supply, fill #4
  Filled 2022-06-03: qty 90, 30d supply, fill #5
  Filled 2022-07-08: qty 90, 30d supply, fill #6
  Filled 2022-08-03: qty 90, 30d supply, fill #7
  Filled 2022-09-03: qty 90, 30d supply, fill #8
  Filled 2022-10-06: qty 90, 30d supply, fill #9
  Filled 2022-11-04: qty 90, 30d supply, fill #10
  Filled 2022-12-01: qty 90, 30d supply, fill #11

## 2021-12-24 ENCOUNTER — Other Ambulatory Visit (HOSPITAL_BASED_OUTPATIENT_CLINIC_OR_DEPARTMENT_OTHER): Payer: Self-pay

## 2021-12-30 ENCOUNTER — Other Ambulatory Visit: Payer: Self-pay | Admitting: General Surgery

## 2021-12-30 DIAGNOSIS — C50412 Malignant neoplasm of upper-outer quadrant of left female breast: Secondary | ICD-10-CM

## 2021-12-30 DIAGNOSIS — N6320 Unspecified lump in the left breast, unspecified quadrant: Secondary | ICD-10-CM

## 2021-12-30 DIAGNOSIS — N644 Mastodynia: Secondary | ICD-10-CM

## 2022-01-02 ENCOUNTER — Other Ambulatory Visit (HOSPITAL_BASED_OUTPATIENT_CLINIC_OR_DEPARTMENT_OTHER): Payer: Self-pay

## 2022-01-06 ENCOUNTER — Ambulatory Visit
Admission: RE | Admit: 2022-01-06 | Discharge: 2022-01-06 | Disposition: A | Discharge status: Home / Self Care | Payer: Commercial Managed Care - HMO | Source: Ambulatory Visit | Attending: General Surgery | Admitting: General Surgery

## 2022-01-06 ENCOUNTER — Ambulatory Visit: Payer: Commercial Managed Care - HMO

## 2022-01-06 DIAGNOSIS — N644 Mastodynia: Secondary | ICD-10-CM

## 2022-01-06 DIAGNOSIS — C50412 Malignant neoplasm of upper-outer quadrant of left female breast: Secondary | ICD-10-CM

## 2022-01-06 DIAGNOSIS — N6320 Unspecified lump in the left breast, unspecified quadrant: Secondary | ICD-10-CM

## 2022-01-20 ENCOUNTER — Other Ambulatory Visit: Payer: Self-pay

## 2022-01-20 ENCOUNTER — Encounter: Payer: Self-pay | Admitting: Physical Therapy

## 2022-01-20 ENCOUNTER — Ambulatory Visit: Payer: Commercial Managed Care - HMO | Attending: General Surgery | Admitting: Physical Therapy

## 2022-01-20 DIAGNOSIS — I890 Lymphedema, not elsewhere classified: Secondary | ICD-10-CM | POA: Diagnosis present

## 2022-01-20 DIAGNOSIS — Z170 Estrogen receptor positive status [ER+]: Secondary | ICD-10-CM | POA: Diagnosis present

## 2022-01-20 DIAGNOSIS — L599 Disorder of the skin and subcutaneous tissue related to radiation, unspecified: Secondary | ICD-10-CM | POA: Diagnosis present

## 2022-01-20 DIAGNOSIS — R293 Abnormal posture: Secondary | ICD-10-CM | POA: Diagnosis present

## 2022-01-20 DIAGNOSIS — C50412 Malignant neoplasm of upper-outer quadrant of left female breast: Secondary | ICD-10-CM | POA: Insufficient documentation

## 2022-01-20 NOTE — Therapy (Signed)
OUTPATIENT PHYSICAL THERAPY ONCOLOGY EVALUATION  Patient Name: Lisa [name] MRN: [id number] DOB:1960-09-20, 61 y.o., female Today's Date: 01/20/2022   PT End of Session - 01/20/22 0810     Visit Number 1    Number of Visits 9    Date for PT Re-Evaluation 02/17/22    PT Start Time 0804    PT Stop Time 0845    PT Time Calculation (min) 41 min    Activity Tolerance Patient tolerated treatment well    Behavior During Therapy [facility] for tasks assessed/performed             Past Medical History:  Diagnosis Date   Anxiety    Arthritis    Breast cancer ([place]) 06/28/21   lump found   Breast cancer ([place])    left breast [facility]   Breast hematoma 12/02/2021   Breast mass, left 07/10/2021   Bulging disc L-5   Chicken pox    Depression    Diabetes mellitus    Hyperlipidemia    Hypertension    Insomnia    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    Meningitis 06/11/2021   bacterial meningitis   Polycystic ovarian disease    Past Surgical History:  Procedure Laterality Date   BREAST LUMPECTOMY Left 08/2021   BREAST LUMPECTOMY WITH SENTINEL LYMPH NODE BIOPSY Left 08/23/2021   Procedure: LEFT BREAST LUMPECTOMY WITH SENTINEL LYMPH NODE BIOPSY;  Surgeon: Stark Klein, MD;  Location: [place];  Service: General;  Laterality: Left;   csf leak in skull  06/10/2003   noted failed attempt to close CSF leak in skull   DILATION AND EVACUATION  06/09/1988   miscarriage   IR FLUORO GUIDED NEEDLE PLC ASPIRATION/INJECTION LOC  06/12/2021   RADIOLOGY WITH ANESTHESIA N/A 06/12/2021   Procedure: IR WITH ANESTHESIA;  Surgeon: Radiologist, Medication, MD;  Location: [place];  Service: Radiology;  Laterality: N/A;   TONSILLECTOMY AND ADENOIDECTOMY  06/10/1971   Patient Active Problem List   Diagnosis Date Noted   Breast hematoma 12/02/2021   Genetic testing 08/22/2021   Family history of breast cancer 08/14/2021   Malignant neoplasm of upper-outer  quadrant of left breast in female, estrogen receptor positive ([place]) 08/12/2021   Breast mass, left 07/10/2021   Fever    Bacterial meningitis    Right sided weakness    Pressure injury of skin 06/13/2021   Sepsis due to Haemophilus influenzae with acute hypoxic respiratory failure and septic shock ([place])    Acute bacterial meningitis 09/32/3557   Acute metabolic encephalopathy 32/20/2542   Altered mental status 06/12/2021   Obesity, morbid, BMI 40.0-49.9 ([place]) 03/24/2016   Family history of breast cancer in sister 10/21/2013   Tick bite of back 10/21/2012   Screening for malignant neoplasm of the cervix 05/19/2012   Routine gynecological examination 05/19/2012   Cough 03/05/2012   Tobacco use 12/30/2011   Diabetes mellitus, type 2 ([place]) 12/29/2011   HTN (hypertension) 12/29/2011   Hyperlipidemia 12/29/2011   CSF leak 12/29/2011   Psoriasis 12/29/2011   PCOS (polycystic ovarian syndrome) 12/29/2011   Depression with anxiety 12/29/2011    PCP: Collene Leyden, MD  REFERRING PROVIDER: Stark Klein, MD   REFERRING DIAG: [phone] (ICD-10-CM) - Other specified postprocedural states   THERAPY DIAG:  Lymphedema, not elsewhere classified - Plan: PT plan of care cert/re-cert  Disorder of the skin and subcutaneous tissue related to radiation, unspecified - Plan: PT plan of care cert/re-cert  Abnormal posture - Plan: PT plan of care cert/re-cert  Malignant neoplasm of upper-outer quadrant of left breast in female, estrogen receptor positive ([place]) - Plan: PT plan of care cert/re-cert  ONSET DATE: 9/38/10  Rationale for Evaluation and Treatment Rehabilitation  SUBJECTIVE                                                                                                                                                                                           SUBJECTIVE STATEMENT: The swelling started during radiation. I had white stuff coming out of my nipple and then blood came out. It got  red and hot. I had a seroma and they drained it (113m). I had to take antibiotics. The seroma never really went away. I think it a combination of lymphedema and a seroma. I feel like the compression bra makes it worse. I have pulling in my armpit when I lift my arm.   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 She had negative margins and 1/5 positive nodes for a final path of pT1cN1a. She received adjuvant radiation. She was going to start anastrozole, but didn't. She was seen on 09/06/2021 with pain and swelling in the axilla, thought to have a possible seroma in the axilla did not attempt aspiration due to history of bacterial meningitis and possible risk of infection. DM, depression, HTN, obesity, arthritis    PAIN:  Are you having pain? No  PRECAUTIONS: Other: L breast lymphedema, temporal bone malformation,  previous R rotator cuff tear  WEIGHT BEARING RESTRICTIONS No  FALLS:  Has patient fallen in last 6 months? No  LIVING ENVIRONMENT: Lives with: husband, daughter, son in law and 3 grandchildren  Lives in: House/apartment Stairs: Yes; pt reports she lives on the first floor, only has to navigate one step from driveway to porch Has following equipment at home: Single point cane, WEnvironmental consultant- 2 wheeled, WEnvironmental consultant- 4 wheeled, aManufacturing engineer OCCUPATION: unemployed  LEISURE: pt does not exercise  HAND DOMINANCE : right   PRIOR LEVEL OF FUNCTION: Independent with community mobility with device  PATIENT GOALS to find out the best way to do the exercises and the massage   OBJECTIVE  COGNITION:  Overall cognitive status: Within functional limits for tasks assessed   PALPATION: Fibrosis in inferior L breast, increased scar tissue along mastectomy scar  OBSERVATIONS / OTHER ASSESSMENTS: increased pore size in  L breast  POSTURE: forward head, rounded shoulders  UPPER EXTREMITY AROM/PROM:  A/PROM RIGHT  eval   Shoulder extension 75  Shoulder flexion 153  Shoulder abduction 151  Shoulder internal rotation 43  Shoulder external rotation 71    (Blank rows = not tested)  A/PROM LEFT   eval  Shoulder extension 74  Shoulder flexion 165  Shoulder abduction 176  Shoulder internal rotation 59  Shoulder external rotation 75    (Blank rows = not tested)    LYMPHEDEMA ASSESSMENTS:   SURGERY TYPE/DATE: L lumpectomy 08/23/21  NUMBER OF LYMPH NODES REMOVED: 1/5  CHEMOTHERAPY: did not require  RADIATION: completed in 2023  HORMONE TREATMENT: did not take  INFECTIONS: possible infection of seroma in L breast at the end of March 2023 that required antibiotics   LYMPHEDEMA ASSESSMENTS:   LANDMARK RIGHT  eval  10 cm proximal to olecranon process 30.9  Olecranon process 26.5  10 cm proximal to ulnar styloid process 22.5  Just proximal to ulnar styloid process 17.2  Across hand at thumb web space 19.7  At base of 2nd digit 6.5  (Blank rows = not tested)  LANDMARK LEFT  eval  10 cm proximal to olecranon process 31.2  Olecranon process 27.5  10 cm proximal to ulnar styloid process 23.2  Just proximal to ulnar styloid process 17.5  Across hand at thumb web space 20.5  At base of 2nd digit 6.4  (Blank rows = not tested)    Breast Scale: 61   TODAY'S TREATMENT  Educated pt to wear chip pack in her compression bra, educated pt about availability of a compression pump  PATIENT EDUCATION:  Education details: wear chip pack in compression bra, compression pump Person educated: Patient Education method: Explanation Education comprehension: verbalized understanding   HOME EXERCISE PROGRAM: Wear chip pack in compression bra  ASSESSMENT:  CLINICAL IMPRESSION: Patient is a 61 y.o. female who was seen today for physical therapy evaluation and treatment for L breast lymphedema.  Pt reports she developed a seroma that was drained after surgery but never went away. They did not drain again due to fear of infection with pt recently having bacterial meningitis. Pt reports her breast has been swelling since the end of March and she has been noticing increased pore size and fibrosis in inferior and medial breast. She also has tightness in L axilla when lifting her L UE towards end range. Pt would benefit from skilled PT services to decrease L breast lymphedema, assist pt with independent management of lymphedema and decrease L axillary tightness.     OBJECTIVE IMPAIRMENTS decreased knowledge of condition, increased edema, increased fascial restrictions, and postural dysfunction.   ACTIVITY LIMITATIONS reach over head  PARTICIPATION LIMITATIONS:  none  PERSONAL FACTORS Fitness, Time since onset of injury/illness/exacerbation, and 1-2 comorbidities: bacterial meningitis, L breast infection  are also affecting patient's functional outcome.   REHAB POTENTIAL: Good  CLINICAL DECISION MAKING: Stable/uncomplicated  EVALUATION COMPLEXITY: Low  GOALS: Goals reviewed with patient? Yes  SHORT TERM GOALS=LONG TERM GOALS  Target date: 02/17/2022    Pt will be independent in self MLD for long term management of lymphedema.  Baseline: Goal status: INITIAL  2.  Pt will demonstrate a 50% reduction in L breast lymphedema as evidenced by decreased pore size.  Baseline:  Goal status: INITIAL  3.  Pt will demonstrate a 75% improvement in fibrosis in inferior and medial breast to decrease risk of infection.  Baseline:  Goal status: INITIAL  4.  Pt will report she is no longer having L axillary tightness with L shoulder end range  of motion to improve comfort.  Baseline:  Goal status: INITIAL   PLAN: PT FREQUENCY: 2x/week  PT DURATION: 4 weeks  PLANNED INTERVENTIONS: Therapeutic exercises, Patient/Family education, Self Care, Orthotic/Fit training, Manual lymph drainage,  Compression bandaging, scar mobilization, Taping, Vasopneumatic device, and Manual therapy  PLAN FOR NEXT SESSION: begin L breast MLD and instruct pt, see if chip pack helps, scar mobilization, MFR L axilla   Evo Aderman Breedlove Blue, PT 01/20/2022, 9:00 AM

## 2022-01-21 ENCOUNTER — Inpatient Hospital Stay: Payer: Commercial Managed Care - HMO | Attending: Hematology | Admitting: Adult Health

## 2022-01-21 ENCOUNTER — Encounter: Payer: Self-pay | Admitting: Adult Health

## 2022-01-21 ENCOUNTER — Other Ambulatory Visit (HOSPITAL_BASED_OUTPATIENT_CLINIC_OR_DEPARTMENT_OTHER): Payer: Self-pay

## 2022-01-21 VITALS — BP 128/63 | HR 84 | Temp 97.5°F | Resp 18 | Ht 66.0 in | Wt 265.4 lb

## 2022-01-21 DIAGNOSIS — C50412 Malignant neoplasm of upper-outer quadrant of left female breast: Secondary | ICD-10-CM | POA: Insufficient documentation

## 2022-01-21 DIAGNOSIS — E1165 Type 2 diabetes mellitus with hyperglycemia: Secondary | ICD-10-CM | POA: Insufficient documentation

## 2022-01-21 DIAGNOSIS — Z7982 Long term (current) use of aspirin: Secondary | ICD-10-CM | POA: Diagnosis not present

## 2022-01-21 DIAGNOSIS — Z794 Long term (current) use of insulin: Secondary | ICD-10-CM | POA: Diagnosis not present

## 2022-01-21 DIAGNOSIS — F17210 Nicotine dependence, cigarettes, uncomplicated: Secondary | ICD-10-CM | POA: Diagnosis not present

## 2022-01-21 DIAGNOSIS — Z170 Estrogen receptor positive status [ER+]: Secondary | ICD-10-CM | POA: Diagnosis not present

## 2022-01-21 DIAGNOSIS — C773 Secondary and unspecified malignant neoplasm of axilla and upper limb lymph nodes: Secondary | ICD-10-CM | POA: Insufficient documentation

## 2022-01-21 DIAGNOSIS — Z79811 Long term (current) use of aromatase inhibitors: Secondary | ICD-10-CM | POA: Diagnosis not present

## 2022-01-21 DIAGNOSIS — Z7984 Long term (current) use of oral hypoglycemic drugs: Secondary | ICD-10-CM | POA: Insufficient documentation

## 2022-01-21 DIAGNOSIS — Z79899 Other long term (current) drug therapy: Secondary | ICD-10-CM | POA: Insufficient documentation

## 2022-01-21 DIAGNOSIS — M5136 Other intervertebral disc degeneration, lumbar region: Secondary | ICD-10-CM | POA: Insufficient documentation

## 2022-01-21 MED ORDER — ESZOPICLONE 3 MG PO TABS
ORAL_TABLET | ORAL | 0 refills | Status: DC
  Filled 2022-01-21: qty 30, 30d supply, fill #0

## 2022-01-21 NOTE — Progress Notes (Signed)
SURVIVORSHIP VISIT:   BRIEF ONCOLOGIC HISTORY:  Oncology History Overview Note   Cancer Staging  Malignant neoplasm of upper-outer quadrant of left breast in female, estrogen receptor positive ([place]) Staging form: Breast, AJCC 8th Edition - Clinical stage from 08/06/2021: Stage IA (cT1c, cN0, cM0, G2, ER+, PR+, HER2-) - Signed by Truitt Merle, MD on 08/13/2021 - Pathologic stage from 08/23/2021: Stage IA (pT1c, pN1a, cM0, G2, ER+, PR+, HER2-, Oncotype DX score: 15) - Signed by Truitt Merle, MD on 10/21/2021     Malignant neoplasm of upper-outer quadrant of left breast in female, estrogen receptor positive ([place])  07/27/2021 Mammogram   CLINICAL DATA:  Palpable lump in the left breast.   EXAM: DIGITAL DIAGNOSTIC BILATERAL MAMMOGRAM WITH TOMOSYNTHESIS AND CAD; ULTRASOUND LEFT BREAST LIMITED  IMPRESSION: Highly suspicious left breast mass. No left axillary adenopathy. No other abnormalities.   08/06/2021 Cancer Staging   Staging form: Breast, AJCC 8th Edition - Clinical stage from 08/06/2021: Stage IA (cT1c, cN0, cM0, G2, ER+, PR+, HER2-) - Signed by Truitt Merle, MD on 08/13/2021 Stage prefix: Initial diagnosis Histologic grading system: 3 grade system   08/06/2021 Initial Biopsy   Diagnosis Breast, left, needle core biopsy, Retroareolar 1.6cm left mass 3:00 (ribbon clip) - INVASIVE MAMMARY CARCINOMA - DUCTAL CARCINOMA IN SITU - SEE COMMENT Microscopic Comment The biopsy material shows an infiltrative proliferation of cells arranged linearly and in small clusters. Based on the biopsy, the carcinoma appears Nottingham grade 2 of 3 and measures 1.5 cm in greatest linear extent.  E-cadherin is POSITIVE supporting a ductal origin.  PROGNOSTIC INDICATORS Results: The tumor cells are EQUIVOCAL for Her2 (2+). Her2 by FISH will be performed and the results reported separately. Estrogen Receptor: 100%, POSITIVE, STRONG-MODERATE STAINING INTENSITY Progesterone Receptor: 100%, POSITIVE, STRONG STAINING  INTENSITY Proliferation Marker Ki67: 5%  FLUORESCENCE IN-SITU HYBRIDIZATION Results: GROUP 5: HER2 **NEGATIVE**   08/12/2021 Initial Diagnosis   Malignant neoplasm of upper-outer quadrant of left breast in female, estrogen receptor positive ([place])    Genetic Testing   [facility] CustomNext Panel was Negative. 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 Report date is 08/28/2021.  The CustomNext-Cancer+RNAinsight panel offered by [facility] includes sequencing and rearrangement analysis for the following 47 genes:  APC, ATM, AXIN2, BARD1, BMPR1A, BRCA1, BRCA2, BRIP1, CDH1, CDK4, CDKN2A, CHEK2, CTNNA1, DICER1, EPCAM, GREM1, HOXB13, KIT, MEN1, MLH1, MSH2, MSH3, MSH6, MUTYH, NBN, NF1, NTHL1, PALB2, PDGFRA, PMS2, POLD1, POLE, PTEN, RAD50, RAD51C, RAD51D, SDHA, SDHB, SDHC, SDHD, SMAD4, SMARCA4, STK11, TP53, TSC1, TSC2, and VHL.  RNA data is routinely analyzed for use in variant interpretation for all genes.   08/23/2021 Cancer Staging   Staging form: Breast, AJCC 8th Edition - Pathologic stage from 08/23/2021: Stage IA (pT1c, pN1a, cM0, G2, ER+, PR+, HER2-, Oncotype DX score: 15) - Signed by Truitt Merle, MD on 10/21/2021 Stage prefix: Initial diagnosis Multigene prognostic tests performed: Oncotype DX Recurrence score range: Greater than or equal to 11 Histologic grading system: 3 grade system Residual tumor (R): R0 - None   08/23/2021 Definitive Surgery   FINAL MICROSCOPIC DIAGNOSIS:   A. BREAST, LEFT, LUMPECTOMY:  - Invasive and in situ ductal carcinoma, 1.9 cm.  - Invasive carcinoma 0.2 cm from posterior margin and 0.3 cm from anterior margin.  - DCIS 0.1 cm from medial margin.  - Biopsy site and biopsy clip.  - See oncology table.   B. LYMPH NODE, LEFT AXILLARY #1, SENTINEL, EXCISION:  - One lymph node negative for metastatic  carcinoma (0/1).   C. LYMPH NODE, LEFT AXILLARY #2, SENTINEL, EXCISION:  - Metastatic carcinoma in one lymph node (1/1).   - Metastasis is 0.4 cm.   D. LYMPH NODE, LEFT AXILLARY #3, SENTINEL, EXCISION:  - One lymph node negative for metastatic carcinoma (0/1).   E. LYMPH NODE, LEFT AXILLARY #4, SENTINEL, EXCISION:  - One lymph node negative for metastatic carcinoma (0/1).   F. LYMPH NODE, LEFT AXILLARY #5, SENTINEL, EXCISION:  - One lymph node negative for metastatic carcinoma (0/1).    08/23/2021 Miscellaneous   Oncotype DX was obtained on the final surgical sample and the recurrence score of 15 predicts a risk of recurrence outside the breast over the next 9 years of 14%, if the Lisa's only systemic therapy is an antiestrogen for 5 years.  It also predicts no apparent benefit from chemotherapy.    09/25/2021 - 10/22/2021 Radiation Therapy   Site Technique Total Dose (Gy) Dose per Fx (Gy) Completed Fx Beam Energies  Breast, Left: Breast_L_Axilla 3D 40.05/40.05 2.67 15/15 10X, 15X  Breast, Left: Breast_L_Bst 3D 10/10 2 5/5 6X, 15X     10/2021 -  Anti-estrogen oral therapy   Exemestane     INTERVAL HISTORY:  Lisa [name] to review her survivorship care plan detailing her treatment course for breast cancer, as well as monitoring long-term side effects of that treatment, education regarding health maintenance, screening, and overall wellness and health promotion.     Overall, Lisa [name] reports feeling moderately well today.  Her visit began with Neoma Laming tearfully explaining to me her family history with her sisters breast cancer diagnoses and her struggle with meningitis earlier this year.  She is unsure whether she wants to take exemestane due to the potential for hot flashes and bone loss leading to fractures.  She is seeing her primary care provider regularly and continues to smoke cigarettes at 1 pack/day and has a more than 40-pack-year tobacco history.    REVIEW OF SYSTEMS:  Review of Systems  Constitutional:  Negative for appetite change, chills, fatigue, fever and unexpected weight change.  HENT:    Negative for hearing loss, lump/mass and trouble swallowing.   Eyes:  Negative for eye problems and icterus.  Respiratory:  Negative for chest tightness, cough and shortness of breath.   Cardiovascular:  Negative for chest pain, leg swelling and palpitations.  Gastrointestinal:  Negative for abdominal distention, abdominal pain, constipation, diarrhea, nausea and vomiting.  Endocrine: Negative for hot flashes.  Genitourinary:  Negative for difficulty urinating.   Musculoskeletal:  Negative for arthralgias.  Skin:  Negative for itching and rash.  Neurological:  Negative for dizziness, extremity weakness, headaches and numbness.  Hematological:  Negative for adenopathy. Does not bruise/bleed easily.  Psychiatric/Behavioral:  Negative for depression. The Lisa is not nervous/anxious.   Breast: Denies any new nodularity, masses, tenderness, nipple changes, or nipple discharge.      ONCOLOGY TREATMENT TEAM:  1. Surgeon:  Dr. Barry Dienes at [facility] 2. Medical Oncologist: Dr. Burr Medico 3. Radiation Oncologist: Dr. 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    Meningitis 06/11/2021   bacterial meningitis   Polycystic ovarian disease    Past Surgical History:  Procedure Laterality Date   BREAST LUMPECTOMY Left 08/2021   BREAST LUMPECTOMY WITH SENTINEL LYMPH NODE BIOPSY Left 08/23/2021   Procedure: LEFT BREAST LUMPECTOMY WITH SENTINEL LYMPH NODE BIOPSY;  Surgeon: Stark Klein, MD;  Location: [place];  Service: General;  Laterality: Left;   csf leak in skull   06/10/2003   noted failed attempt to close CSF leak in skull   DILATION AND EVACUATION  06/09/1988   miscarriage   IR FLUORO GUIDED NEEDLE PLC ASPIRATION/INJECTION LOC  06/12/2021   RADIOLOGY WITH ANESTHESIA N/A 06/12/2021   Procedure: IR WITH ANESTHESIA;  Surgeon: Radiologist, Medication, MD;  Location: [place];  Service: Radiology;  Laterality: N/A;   TONSILLECTOMY AND ADENOIDECTOMY  06/10/1971     ALLERGIES:  Allergies  Allergen Reactions   Statins Other (See Comments)    Other reaction(s): Other CSF leaking out nose. Headache   Glimepiride Other (See Comments)    Hypoglicemia     CURRENT MEDICATIONS:  Outpatient Encounter Medications as of 01/21/2022  Medication Sig   exemestane (AROMASIN) 25 MG tablet Take 1 tablet (25 mg total) by mouth daily after breakfast. (Lisa not taking: Reported on 12/02/2021)   albuterol (VENTOLIN HFA) 108 (90 Base) MCG/ACT inhaler Inhale 2 puffs into the lungs every 6 (six) hours as needed for wheezing. (Lisa not taking: Reported on 12/02/2021)   ALPRAZolam (XANAX) 0.25 MG tablet Take 1 tablet (0.25 mg total) by mouth 2 (two) times daily as needed for anxiety.   ALPRAZolam (XANAX) 0.25 MG tablet Take 1/2 to 1 tablet by mouth daily as needed for anxiety   Amoxicillin-Pot Clavulanate (AUGMENTIN PO) Take by mouth.   aspirin EC 81 MG tablet Take 81 mg by mouth daily. Swallow whole.   buPROPion (WELLBUTRIN XL) 300 MG 24 hr tablet Take 1 tablet by mouth every morning.   buPROPion (WELLBUTRIN XL) 300 MG 24 hr tablet Take 1 tablet by mouth in the morning   dapagliflozin propanediol (FARXIGA) 5 MG TABS tablet Take 1 tablet by mouth once a day   diclofenac (VOLTAREN) 75 MG EC tablet Take 1 tablet by mouth twice a day as needed   diclofenac (VOLTAREN) 75 MG EC tablet Take 1 tablet by mouth 2 times daily as needed   diclofenac Sodium (VOLTAREN) 1 % GEL Apply 4 g topically every 6 (six) hours as needed (Shoulder pain).   doxycycline (MONODOX) 100 MG capsule  Take 1 capsule (100 mg total) by mouth 2 (two) times daily for 7 days (Lisa not taking: Reported on 12/02/2021)   Eszopiclone 3 MG TABS TAKE 1 TABLET BY MOUTH ONCE DAILY IMMEDIATELY BEFORE BEDTIME   Eszopiclone 3 MG TABS Take 1 tablet by mouth immediately before bedtime once a day   ezetimibe (ZETIA) 10 MG tablet Take 1 tablet by mouth once a day   fluconazole (DIFLUCAN) 150 MG tablet Take 1 tablet by mouth once (Lisa not taking: Reported on 12/02/2021)   insulin glargine (LANTUS SOLOSTAR) 100 UNIT/ML Solostar Pen Inject 10 units under the skin daily   insulin glargine (LANTUS) 100 UNIT/ML Solostar Pen Inject 5 Units into the skin at bedtime. (Lisa taking differently: Inject 5 Units into the skin at bedtime. Lisa states 10 units at bedtime)   Insulin Pen Needle 31G X 8 MM MISC  Use as directed   liraglutide (VICTOZA) 18 MG/3ML SOPN Inject 0.6 mg into the skin daily. (Lisa not taking: Reported on 12/02/2021)   lisinopril (ZESTRIL) 5 MG tablet Take 1 tablet (5 mg total) by mouth daily. (Lisa not taking: Reported on 12/02/2021)   metFORMIN (GLUCOPHAGE) 500 MG tablet Take 1 tablet by mouth in morning, and 2 tablets by mouth at night once a day   omeprazole (PRILOSEC) 20 MG capsule Take 1 capsule by mouth twice a day   ondansetron (ZOFRAN) 4 MG tablet Take 1 tablet (4 mg total) by mouth every 8 (eight) hours as needed for nausea or vomiting.   oxyCODONE (OXY IR/ROXICODONE) 5 MG immediate release tablet Take 1 tablet (5 mg total) by mouth every 6 (six) hours as needed for severe pain.   oxyCODONE (OXY IR/ROXICODONE) 5 MG immediate release tablet Take 1 tablet (5 mg total) by mouth every 4 (four) hours as needed for Pain   oxycodone (OXY-IR) 5 MG capsule Take 5 mg by mouth every 4 (four) hours as needed.   sertraline (ZOLOFT) 100 MG tablet Take 2 tablets by mouth once daily   sertraline (ZOLOFT) 100 MG tablet Take 2 tablets by mouth daily.   sertraline (ZOLOFT) 100 MG tablet Take 2 tablets by  mouth once a day   sertraline (ZOLOFT) 100 MG tablet Take 2 tablets by mouth once a day   spironolactone (ALDACTONE) 25 MG tablet TAKE 1 TABLET BY MOUTH ONCE DAILY (Lisa taking differently: Take 25 mg by mouth daily.)   spironolactone (ALDACTONE) 25 MG tablet Take 1 tablet (25 mg total) by mouth daily.   tiZANidine (ZANAFLEX) 4 MG capsule Take 4 mg by mouth 3 (three) times daily.   tiZANidine (ZANAFLEX) 4 MG tablet Take 1 tablet by mouth 3 times daily as needed   ULTICARE SHORT PEN NEEDLES 31G X 8 MM MISC Please use to inject Victoza once daily.   No facility-administered encounter medications on file as of 01/21/2022.     ONCOLOGIC FAMILY HISTORY:  Family History  Problem Relation Age of Onset   Arthritis Mother    Hypertension Mother    Obesity Mother    Arthritis Father    Prostate cancer Father    Heart disease Father    Marfan syndrome Father    Hyperlipidemia Sister    Diabetes Sister    Marfan syndrome Sister    Breast cancer Sister    Early death Sister    Stroke Sister    Breast cancer Sister    Arthritis Sister    Diabetes Maternal Aunt    Diabetes Maternal Uncle    Bladder Cancer Maternal Uncle    Breast cancer Paternal Aunt    Hypertension Maternal Grandmother    Heart disease Maternal Grandmother    Diabetes Paternal Grandmother    Heart disease Paternal Grandmother    Stroke Paternal Grandmother    Breast cancer Paternal Grandmother    Heart disease Paternal Grandfather      SOCIAL HISTORY:  Social History   Socioeconomic History   Marital status: Married    Spouse name: Not on file   Number of children: 2   Years of education: Not on file   Highest education level: Not on file  Occupational History   Not on file  Tobacco Use   Smoking status: Every Day    Packs/day: 1.00    Years: 44.00    Total pack years: 44.00    Types: Cigarettes    Last  attempt to quit: 06/10/2021    Years since quitting: 0.6   Smokeless tobacco: Never  Substance and  Sexual Activity   Alcohol use: No   Drug use: No   Sexual activity: Yes    Birth control/protection: Other-see comments, Post-menopausal    Comment: vasectomy  Other Topics Concern   Not on file  Social History Narrative   Not on file   Social Determinants of Health   Financial Resource Strain: Medium Risk (08/14/2021)   Overall Financial Resource Strain (CARDIA)    Difficulty of Paying Living Expenses: Somewhat hard  Food Insecurity: No Food Insecurity (08/14/2021)   Hunger Vital Sign    Worried About Running Out of Food in the Last Year: Never true    Ran Out of Food in the Last Year: Never true  Transportation Needs: No Transportation Needs (08/14/2021)   PRAPARE - Hydrologist (Medical): No    Lack of Transportation (Non-Medical): No  Physical Activity: Not on file  Stress: Not on file  Social Connections: Not on file  Intimate Partner Violence: Not on file     OBSERVATIONS/OBJECTIVE:  BP 128/63 (BP Location: Left Arm, Lisa Position: Sitting)   Pulse 84   Temp (!) 97.5 F (36.4 C) (Tympanic)   Resp 18   Ht 5' 6"  (1.676 m)   Wt 265 lb 6.4 oz (120.4 kg)   LMP 11/02/2013   SpO2 98%   BMI 42.84 kg/m  GENERAL: Lisa is a well appearing female in no acute distress HEENT:  Sclerae anicteric.  Oropharynx clear and moist. No ulcerations or evidence of oropharyngeal candidiasis. Neck is supple.  NODES:  No cervical, supraclavicular, or axillary lymphadenopathy palpated.  BREAST EXAM: Left breast status postlumpectomy and radiation no sign of local recurrence right breast is benign. LUNGS:  Clear to auscultation bilaterally.  No wheezes or rhonchi. HEART:  Regular rate and rhythm. No murmur appreciated. ABDOMEN:  Soft, nontender.  Positive, normoactive bowel sounds. No organomegaly palpated. MSK:  No focal spinal tenderness to palpation. Full range of motion bilaterally in the upper extremities. EXTREMITIES:  No peripheral edema.   SKIN:  Clear  with no obvious rashes or skin changes. No nail dyscrasia. NEURO:  Nonfocal. Well oriented.  Appropriate affect.   LABORATORY DATA:  None for this visit.  DIAGNOSTIC IMAGING:  None for this visit.      ASSESSMENT AND PLAN:  Lisa [name] is a pleasant 61 y.o. 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  She presents to the Survivorship Clinic for our initial meeting and routine follow-up post-completion of treatment for breast cancer.    1. Stage 1A left breast cancer:  Lisa [name] is continuing to recover from definitive treatment for breast cancer. She will follow-up with her medical oncologist, Dr. Burr Medico in 04/2022 with history and physical exam per surveillance protocol.  She has not yet started taking exemestane due to concern for side effects.  She understands foregoing the exemestane will increase her risk for recurrence and she may not experience any side effects from the [facility] won't know until she tries taking it.  She is going to think about this, and will f/u with Dr. Burr Medico.  She has exemestane at home if she decides to take it.. Her mammogram is due February 2024; orders placed today. Today, a comprehensive survivorship care plan and treatment summary  was reviewed with the Lisa today detailing her breast cancer diagnosis, treatment course, potential late/long-term effects of treatment, appropriate follow-up care with recommendations for the future, and Lisa education resources.  A copy of this summary, along with a letter will be sent to the Lisa's primary care provider via mail/fax/In Basket message after today's visit.    2. Bone health:  Given Lisa [name]'s age/history of breast cancer and her treatment regimen including anti-estrogen therapy with exemestane, she is at risk for bone  demineralization.  Her last DEXA scan was Oct 31, 2021 which showed a normal bone density with a T score of -0.2.  She is recommended to undergo bone density testing every 2 years while taking the exemestane.    She was given education on specific activities to promote bone health.  3. Cancer screening:  Due to Lisa [name]'s history and her age, she should receive screening for skin cancers, colon cancer, lung cancer, and gynecologic cancers.  The information and recommendations are listed on the Lisa's comprehensive care plan/treatment summary and were reviewed in detail with the Lisa.   4. lung cancer screening: I discussed screening low dose CT chest without contrast for early detection of lung cancer in this Counseling and Shared Decision-Making Visit  Lisa [name] with 03-27-1961 and Age 67 y.o. years met the following criteria and I counseled that in  A) age 72-75 AND B) smoking history of 20 pack year smoking AND C) Current smoker or one who has quit smoking within the last 15 years - Annual low dose CT chest can pick up lung cancer early and has potential to save lives and cure lung cancer - This is similar in concept to screening mammogram, colonoscopies and pap smears - I explained Ct scan chest is low dose radiation CT chest - I explained early lung cancer asymptomatic and only way to  detect is CT  With the real advantage that early lung cancer is curable through radiation or surgery - I explained CT superior to CXR - I explained that false positives are present and can incur cost and workup like biopsies, additional scan but benefit outweighs risk - I counseled that Lisa should  QUIT SMOKING  - I counseled that Lisa should adhered to protocol requirements of scan and followup scans  5. Health maintenance and wellness promotion: Lisa [name] was encouraged to consume 5-7 servings of fruits and vegetables per day. We reviewed the "Nutrition Rainbow" handout.  She was  also encouraged to engage in moderate to vigorous exercise for 30 minutes per day most days of the week. We discussed the [employer] fitness program, which is designed for cancer survivors to help them become more physically fit after cancer treatments.  She was instructed to limit her alcohol consumption and was encouraged stop smoking.     6. Support services/counseling: It is not uncommon for this period of the Lisa's cancer care trajectory to be one of many emotions and stressors.  She was given information regarding our available services and encouraged to contact me with any questions or for help enrolling in any of our support group/programs.    Follow up instructions:    -Return to cancer center 04/23/2022 for f/u with Dr. Burr Medico  -Mammogram due in 07/2022 -CT lung cancer screening -Bone density testing due 10/2023 -She is welcome to return back to the Survivorship Clinic at any time; no additional follow-up needed at this time.  -Consider referral back to survivorship as a long-term survivor  for continued surveillance  The Lisa was provided an opportunity to ask questions and all were answered. The Lisa agreed with the plan and demonstrated an understanding of the instructions.   Total encounter time:45 minutes*in face-to-face visit time, chart review, lab review, care coordination, order entry, and documentation of the encounter time.    Wilber Bihari, NP 01/21/22 11:59 AM Medical Oncology and Hematology [facility] [place], [place] [address] Tel. [phone]    Fax. [phone]  *Total Encounter Time as defined by the Centers for Medicare and Medicaid Services includes, in addition to the face-to-face time of a Lisa visit (documented in the note above) non-face-to-face time: obtaining and reviewing outside history, ordering and reviewing medications, tests or procedures, care coordination (communications with other health care  professionals or caregivers) and documentation in the medical record.

## 2022-01-22 ENCOUNTER — Other Ambulatory Visit (HOSPITAL_BASED_OUTPATIENT_CLINIC_OR_DEPARTMENT_OTHER): Payer: Self-pay

## 2022-01-22 ENCOUNTER — Ambulatory Visit: Payer: Commercial Managed Care - HMO

## 2022-01-22 DIAGNOSIS — I890 Lymphedema, not elsewhere classified: Secondary | ICD-10-CM

## 2022-01-22 DIAGNOSIS — C50412 Malignant neoplasm of upper-outer quadrant of left female breast: Secondary | ICD-10-CM

## 2022-01-22 DIAGNOSIS — L599 Disorder of the skin and subcutaneous tissue related to radiation, unspecified: Secondary | ICD-10-CM

## 2022-01-22 DIAGNOSIS — R293 Abnormal posture: Secondary | ICD-10-CM

## 2022-01-22 MED ORDER — BUPROPION HCL ER (XL) 300 MG PO TB24
300.0000 mg | ORAL_TABLET | Freq: Every morning | ORAL | 3 refills | Status: AC
  Filled 2022-01-22: qty 30, 30d supply, fill #0
  Filled 2022-02-26: qty 30, 30d supply, fill #1
  Filled 2022-03-30: qty 30, 30d supply, fill #2
  Filled 2022-05-05: qty 30, 30d supply, fill #3
  Filled 2022-06-03: qty 30, 30d supply, fill #4
  Filled 2022-07-03: qty 30, 30d supply, fill #5
  Filled 2022-08-03: qty 30, 30d supply, fill #6
  Filled 2022-09-03: qty 30, 30d supply, fill #7
  Filled 2022-10-06: qty 30, 30d supply, fill #8
  Filled 2022-11-04: qty 30, 30d supply, fill #9
  Filled 2022-12-01: qty 30, 30d supply, fill #10

## 2022-01-22 NOTE — Therapy (Signed)
OUTPATIENT PHYSICAL THERAPY ONCOLOGY TREATMENT  Patient Name: Lisa [name] MRN: [id number] DOB:1961-04-18, 61 y.o., female Today's Date: 01/22/2022   PT End of Session - 01/22/22 1502     Visit Number 2    Number of Visits 9    Date for PT Re-Evaluation 02/17/22    PT Start Time 1504    PT Stop Time 1600    PT Time Calculation (min) 56 min    Activity Tolerance Patient tolerated treatment well    Behavior During Therapy [facility] for tasks assessed/performed             Past Medical History:  Diagnosis Date   Anxiety    Arthritis    Breast cancer ([place]) 06/28/21   lump found   Breast cancer ([place])    left breast [facility]   Breast hematoma 12/02/2021   Breast mass, left 07/10/2021   Bulging disc L-5   Chicken pox    Depression    Diabetes mellitus    Hyperlipidemia    Hypertension    Insomnia    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    Meningitis 06/11/2021   bacterial meningitis   Polycystic ovarian disease    Past Surgical History:  Procedure Laterality Date   BREAST LUMPECTOMY Left 08/2021   BREAST LUMPECTOMY WITH SENTINEL LYMPH NODE BIOPSY Left 08/23/2021   Procedure: LEFT BREAST LUMPECTOMY WITH SENTINEL LYMPH NODE BIOPSY;  Surgeon: Stark Klein, MD;  Location: [place];  Service: General;  Laterality: Left;   csf leak in skull  06/10/2003   noted failed attempt to close CSF leak in skull   DILATION AND EVACUATION  06/09/1988   miscarriage   IR FLUORO GUIDED NEEDLE PLC ASPIRATION/INJECTION LOC  06/12/2021   RADIOLOGY WITH ANESTHESIA N/A 06/12/2021   Procedure: IR WITH ANESTHESIA;  Surgeon: Radiologist, Medication, MD;  Location: [place];  Service: Radiology;  Laterality: N/A;   TONSILLECTOMY AND ADENOIDECTOMY  06/10/1971   Patient Active Problem List   Diagnosis Date Noted   Hyperglycemia due to type 2 diabetes mellitus ([place]) 01/21/2022   Degeneration of lumbar intervertebral disc 01/21/2022   Breast hematoma  12/02/2021   Genetic testing 08/22/2021   Family history of breast cancer 08/14/2021   Malignant neoplasm of upper-outer quadrant of left breast in female, estrogen receptor positive ([place]) 08/12/2021   Breast mass, left 07/10/2021   Fever    Bacterial meningitis    Right sided weakness    Pressure injury of skin 06/13/2021   Sepsis due to Haemophilus influenzae with acute hypoxic respiratory failure and septic shock ([place])    Acute bacterial meningitis 03/47/4259   Acute metabolic encephalopathy 56/38/7564   Altered mental status 06/12/2021   Obesity, morbid, BMI 40.0-49.9 ([place]) 03/24/2016   Family history of breast cancer in sister 10/21/2013   Tick bite of back 10/21/2012   Screening for malignant neoplasm of the cervix 05/19/2012   Routine gynecological examination 05/19/2012   Tobacco use 12/30/2011   Diabetes mellitus, type 2 ([place]) 12/29/2011   HTN (hypertension) 12/29/2011   Hyperlipidemia 12/29/2011   CSF leak 12/29/2011   Psoriasis 12/29/2011   PCOS (polycystic ovarian syndrome) 12/29/2011   Depression with anxiety 12/29/2011    PCP: Collene Leyden, MD  REFERRING PROVIDER: Stark Klein, MD   REFERRING DIAG: [phone] (ICD-10-CM) - Other specified postprocedural states   THERAPY DIAG:  Lymphedema, not elsewhere classified  Disorder of the skin and subcutaneous tissue related to radiation, unspecified  Abnormal posture  Malignant neoplasm of upper-outer quadrant of left breast in female, estrogen receptor positive ([place])  ONSET DATE: 08/23/21  Rationale for Evaluation and Treatment Rehabilitation  SUBJECTIVE                                                                                                                                                                                           SUBJECTIVE STATEMENT: I can't tell if the pack she gave me is helping.  I feel like the dimples get deeper after I wear the pack.    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 She had negative margins and 1/5 positive nodes for a final path of pT1cN1a. She received adjuvant radiation. She was going to start anastrozole, but didn't. She was seen on 09/06/2021 with pain and swelling in the axilla, thought to have a possible seroma in the axilla did not attempt aspiration due to history of bacterial meningitis and possible risk of infection. DM, depression, HTN, obesity, arthritis    PAIN:  Are you having pain? No soreness/discomfort with raising my left arm  PRECAUTIONS: Other: L breast lymphedema, temporal bone malformation,  previous R rotator cuff tear  WEIGHT BEARING RESTRICTIONS No  FALLS:  Has patient fallen in last 6 months? No  LIVING ENVIRONMENT: Lives with: husband, daughter, son in law and 3 grandchildren  Lives in: House/apartment Stairs: Yes; pt reports she lives on the first floor, only has to navigate one step from driveway to porch Has following equipment at home: Single point cane, Environmental consultant - 2 wheeled, Environmental consultant - 4 wheeled, Manufacturing engineer  OCCUPATION: unemployed  LEISURE: pt does not exercise  HAND DOMINANCE : right   PRIOR LEVEL OF FUNCTION: Independent with community mobility with device  PATIENT GOALS to find out the best way to do the exercises and the massage   OBJECTIVE  COGNITION:  Overall cognitive status: Within functional limits for tasks assessed   PALPATION: Fibrosis in inferior L breast, increased scar tissue along mastectomy scar  OBSERVATIONS / OTHER ASSESSMENTS: increased pore size in L breast  POSTURE: forward head, rounded shoulders  UPPER EXTREMITY AROM/PROM:  A/PROM RIGHT   eval   Shoulder extension 75  Shoulder flexion 153  Shoulder abduction 151  Shoulder internal rotation 43  Shoulder external rotation 71    (Blank rows = not  tested)  A/PROM LEFT   eval  Shoulder extension 74  Shoulder flexion 165  Shoulder abduction 176  Shoulder internal rotation 59  Shoulder external rotation 75    (Blank rows = not tested)    LYMPHEDEMA ASSESSMENTS:   SURGERY TYPE/DATE: L lumpectomy 08/23/21  NUMBER OF LYMPH NODES REMOVED: 1/5  CHEMOTHERAPY: did not require  RADIATION: completed in 2023  HORMONE TREATMENT: did not take  INFECTIONS: possible infection of seroma in L breast at the end of March 2023 that required antibiotics   LYMPHEDEMA ASSESSMENTS:   LANDMARK RIGHT  eval  10 cm proximal to olecranon process 30.9  Olecranon process 26.5  10 cm proximal to ulnar styloid process 22.5  Just proximal to ulnar styloid process 17.2  Across hand at thumb web space 19.7  At base of 2nd digit 6.5  (Blank rows = not tested)  LANDMARK LEFT  eval  10 cm proximal to olecranon process 31.2  Olecranon process 27.5  10 cm proximal to ulnar styloid process 23.2  Just proximal to ulnar styloid process 17.5  Across hand at thumb web space 20.5  At base of 2nd digit 6.4  (Blank rows = not tested)    Breast Scale: 61   TODAY'S TREATMENT   01/22/2022 AA shoulder flexion with clasped hands x 5 to decrease pain at axillary region. MLD to supraclavicular region, right axillary LN, left inguinal LN's, anterior interaxillary pathway, left axillo-inguinal pathway, left breast directing toward appropriate pathways and ending with LN's. Pt was instructed in all motions and performed each several times with VC's and TC's . Gentle scar massage    01/20/2022 Educated pt to wear chip pack in her compression bra, educated pt about availability of a compression pump  PATIENT EDUCATION:  Education details: wear chip pack in compression bra, compression pump Person educated: Patient Education method: Explanation Education comprehension: verbalized understanding   HOME EXERCISE PROGRAM: Wear chip pack in compression  bra  ASSESSMENT:         CLINICAL IMPRESSIONS; Initiated Manual lymphatic drainage to left breast and instructed pt in same as per todays treatment. Pt continues to have enlarged pores and she feels the pad is making them worse. She will wear for 30 min to an hour and recheck if it appears worse. Pt did well with MLD but requires further review  OBJECTIVE IMPAIRMENTS decreased knowledge of condition, increased edema, increased fascial restrictions, and postural dysfunction.   ACTIVITY LIMITATIONS reach over head  PARTICIPATION LIMITATIONS:  none  PERSONAL FACTORS Fitness, Time since onset of injury/illness/exacerbation, and 1-2 comorbidities: bacterial meningitis, L breast infection  are also affecting patient's functional outcome.   REHAB POTENTIAL: Good  CLINICAL DECISION MAKING: Stable/uncomplicated  EVALUATION COMPLEXITY: Low  GOALS: Goals reviewed with patient? Yes  SHORT TERM GOALS=LONG TERM GOALS  Target date: 02/17/2022    Pt will be independent in self MLD for long term management of lymphedema.  Baseline: Goal status: INITIAL  2.  Pt will demonstrate a 50% reduction in L breast lymphedema as evidenced by decreased pore size.  Baseline:  Goal status: INITIAL  3.  Pt will demonstrate a 75% improvement in fibrosis in inferior and medial breast to decrease risk of infection.  Baseline:  Goal status: INITIAL  4.  Pt will report she is no longer having L axillary tightness with L shoulder end range of motion to improve comfort.  Baseline:  Goal status: INITIAL   PLAN: PT FREQUENCY: 2x/week  PT DURATION: 4 weeks  PLANNED INTERVENTIONS: Therapeutic exercises, Patient/Family education, Self Care, Orthotic/Fit training, Manual lymph drainage, Compression bandaging, scar mobilization, Taping, Vasopneumatic device, and Manual therapy  PLAN FOR  NEXT SESSION: begin L breast MLD and instruct pt, see if chip pack helps, scar mobilization, MFR L axilla   Claris Pong, PT 01/22/2022, 5:13 PM

## 2022-01-28 ENCOUNTER — Ambulatory Visit: Payer: Commercial Managed Care - HMO

## 2022-01-28 DIAGNOSIS — C50412 Malignant neoplasm of upper-outer quadrant of left female breast: Secondary | ICD-10-CM

## 2022-01-28 DIAGNOSIS — R293 Abnormal posture: Secondary | ICD-10-CM

## 2022-01-28 DIAGNOSIS — L599 Disorder of the skin and subcutaneous tissue related to radiation, unspecified: Secondary | ICD-10-CM

## 2022-01-28 DIAGNOSIS — I890 Lymphedema, not elsewhere classified: Secondary | ICD-10-CM | POA: Diagnosis not present

## 2022-01-28 NOTE — Therapy (Addendum)
OUTPATIENT PHYSICAL THERAPY ONCOLOGY TREATMENT  Patient Name: Lisa [name] MRN: [id number] DOB:02/08/1961, 60 y.o., female Today's Date: 01/28/2022   PT End of Session - 01/28/22 0958     Visit Number 3    Number of Visits 9    Date for PT Re-Evaluation 02/17/22    PT Start Time 1000    PT Stop Time 1048    PT Time Calculation (min) 48 min    Activity Tolerance Patient tolerated treatment well    Behavior During Therapy [facility] for tasks assessed/performed             Past Medical History:  Diagnosis Date   Anxiety    Arthritis    Breast cancer ([place]) 06/28/21   lump found   Breast cancer ([place])    left breast [facility]   Breast hematoma 12/02/2021   Breast mass, left 07/10/2021   Bulging disc L-5   Chicken pox    Depression    Diabetes mellitus    Hyperlipidemia    Hypertension    Insomnia    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    Meningitis 06/11/2021   bacterial meningitis   Polycystic ovarian disease    Past Surgical History:  Procedure Laterality Date   BREAST LUMPECTOMY Left 08/2021   BREAST LUMPECTOMY WITH SENTINEL LYMPH NODE BIOPSY Left 08/23/2021   Procedure: LEFT BREAST LUMPECTOMY WITH SENTINEL LYMPH NODE BIOPSY;  Surgeon: Stark Klein, MD;  Location: [place];  Service: General;  Laterality: Left;   csf leak in skull  06/10/2003   noted failed attempt to close CSF leak in skull   DILATION AND EVACUATION  06/09/1988   miscarriage   IR FLUORO GUIDED NEEDLE PLC ASPIRATION/INJECTION LOC  06/12/2021   RADIOLOGY WITH ANESTHESIA N/A 06/12/2021   Procedure: IR WITH ANESTHESIA;  Surgeon: Radiologist, Medication, MD;  Location: [place];  Service: Radiology;  Laterality: N/A;   TONSILLECTOMY AND ADENOIDECTOMY  06/10/1971   Patient Active Problem List   Diagnosis Date Noted   Hyperglycemia due to type 2 diabetes mellitus ([place]) 01/21/2022   Degeneration of lumbar intervertebral disc 01/21/2022   Breast hematoma  12/02/2021   Genetic testing 08/22/2021   Family history of breast cancer 08/14/2021   Malignant neoplasm of upper-outer quadrant of left breast in female, estrogen receptor positive ([place]) 08/12/2021   Breast mass, left 07/10/2021   Fever    Bacterial meningitis    Right sided weakness    Pressure injury of skin 06/13/2021   Sepsis due to Haemophilus influenzae with acute hypoxic respiratory failure and septic shock ([place])    Acute bacterial meningitis 37/09/8887   Acute metabolic encephalopathy 16/94/5038   Altered mental status 06/12/2021   Obesity, morbid, BMI 40.0-49.9 ([place]) 03/24/2016   Family history of breast cancer in sister 10/21/2013   Tick bite of back 10/21/2012   Screening for malignant neoplasm of the cervix 05/19/2012   Routine gynecological examination 05/19/2012   Tobacco use 12/30/2011   Diabetes mellitus, type 2 ([place]) 12/29/2011   HTN (hypertension) 12/29/2011   Hyperlipidemia 12/29/2011   CSF leak 12/29/2011   Psoriasis 12/29/2011   PCOS (polycystic ovarian syndrome) 12/29/2011   Depression with anxiety 12/29/2011    PCP: Collene Leyden, MD  REFERRING PROVIDER: Stark Klein, MD   REFERRING DIAG: [phone] (ICD-10-CM) - Other specified postprocedural states   THERAPY DIAG:  Lymphedema, not elsewhere classified  Disorder of the skin and subcutaneous tissue related to radiation, unspecified  Abnormal posture  Malignant neoplasm of upper-outer quadrant of left breast in female, estrogen receptor positive ([place])  ONSET DATE: 08/23/21  Rationale for Evaluation and Treatment Rehabilitation  SUBJECTIVE                                                                                                                                                                                           SUBJECTIVE STATEMENT: I think my breast  is doing better. It looked good this am when I got up.The firm area wasn't as hard. I forgot to try the foam paad for an hour the other day,  but I have my compression bra on presently with the foam pad and it feels softer. The armpit area is feeling better too.   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 She had negative margins and 1/5 positive nodes for a final path of pT1cN1a. She received adjuvant radiation. She was going to start anastrozole, but didn't. She was seen on 09/06/2021 with pain and swelling in the axilla, thought to have a possible seroma in the axilla did not attempt aspiration due to history of bacterial meningitis and possible risk of infection. DM, depression, HTN, obesity, arthritis    PAIN:  Are you having pain? No soreness/discomfort with raising my left arm  PRECAUTIONS: Other: L breast lymphedema, temporal bone malformation,  previous R rotator cuff tear  WEIGHT BEARING RESTRICTIONS No  FALLS:  Has patient fallen in last 6 months? No  LIVING ENVIRONMENT: Lives with: husband, daughter, son in law and 3 grandchildren  Lives in: House/apartment Stairs: Yes; pt reports she lives on the first floor, only has to navigate one step from driveway to porch Has following equipment at home: Single point cane, Environmental consultant - 2 wheeled, Environmental consultant - 4 wheeled, Manufacturing engineer  OCCUPATION: unemployed  LEISURE: pt does not exercise  HAND DOMINANCE : right   PRIOR LEVEL OF FUNCTION: Independent with community mobility with device  PATIENT GOALS to find out the best way to do the exercises and the massage   OBJECTIVE  COGNITION:  Overall cognitive status: Within functional limits for tasks assessed   PALPATION: Fibrosis in inferior L breast, increased scar tissue along mastectomy scar  OBSERVATIONS / OTHER ASSESSMENTS: increased pore size in L breast  POSTURE: forward head, rounded shoulders  UPPER EXTREMITY AROM/PROM:  A/PROM RIGHT    eval   Shoulder extension 75  Shoulder flexion 153  Shoulder abduction 151  Shoulder internal rotation 43  Shoulder external  rotation 71    (Blank rows = not tested)  A/PROM LEFT   eval  Shoulder extension 74  Shoulder flexion 165  Shoulder abduction 176  Shoulder internal rotation 59  Shoulder external rotation 75    (Blank rows = not tested)    LYMPHEDEMA ASSESSMENTS:   SURGERY TYPE/DATE: L lumpectomy 08/23/21  NUMBER OF LYMPH NODES REMOVED: 1/5  CHEMOTHERAPY: did not require  RADIATION: completed in 2023  HORMONE TREATMENT: did not take  INFECTIONS: possible infection of seroma in L breast at the end of March 2023 that required antibiotics   LYMPHEDEMA ASSESSMENTS:   LANDMARK RIGHT  eval  10 cm proximal to olecranon process 30.9  Olecranon process 26.5  10 cm proximal to ulnar styloid process 22.5  Just proximal to ulnar styloid process 17.2  Across hand at thumb web space 19.7  At base of 2nd digit 6.5  (Blank rows = not tested)  LANDMARK LEFT  eval  10 cm proximal to olecranon process 31.2  Olecranon process 27.5  10 cm proximal to ulnar styloid process 23.2  Just proximal to ulnar styloid process 17.5  Across hand at thumb web space 20.5  At base of 2nd digit 6.4  (Blank rows = not tested)    Breast Scale: 61   TODAY'S TREATMENT   01/28/2022  MLD to supraclavicular region, 5 diaphragmatic breaths, right and left axillary LN, left inguinal LN's, anterior interaxillary pathway, left axillo-inguinal pathway, left breast directing toward appropriate pathways and ending with LN's. Pt was instructed in all techniques and performed each several times with VC's and TC's . Gentle scar massage to areola incision. Pt is seeing the benefit of MLD    01/22/2022 AA shoulder flexion with clasped hands x 5 to decrease pain at axillary region. MLD to supraclavicular region, right axillary LN, left inguinal LN's, anterior interaxillary pathway, left  axillo-inguinal pathway, left breast directing toward appropriate pathways and ending with LN's. Pt was instructed in all motions and performed each several times with VC's and TC's . Gentle scar massage    01/20/2022 Educated pt to wear chip pack in her compression bra, educated pt about availability of a compression pump  PATIENT EDUCATION:  Education details: wear chip pack in compression bra, compression pump Person educated: Patient Education method: Explanation Education comprehension: verbalized understanding   HOME EXERCISE PROGRAM: Wear chip pack in compression bra  ASSESSMENT:         CLINICAL IMPRESSIONS; Continued Manual lymphatic drainage to left breast and reviewed with pt in same order as per todays treatment. Pt continues to have enlarged pores, but there is less fibrosis at the medial breast . Scar tissue/fibrosis remains at nipple area. She did very well with self MLD OBJECTIVE IMPAIRMENTS decreased knowledge of condition, increased edema, increased fascial restrictions, and postural dysfunction.   ACTIVITY LIMITATIONS reach over head  PARTICIPATION LIMITATIONS:  none  PERSONAL FACTORS Fitness, Time since onset of injury/illness/exacerbation, and 1-2 comorbidities: bacterial meningitis, L breast infection  are also affecting patient's functional outcome.   REHAB POTENTIAL: Good  CLINICAL DECISION MAKING: Stable/uncomplicated  EVALUATION COMPLEXITY: Low  GOALS: Goals reviewed with patient? Yes  SHORT TERM GOALS=LONG TERM GOALS  Target date: 02/17/2022    Pt will be independent in self MLD for long term management of lymphedema.  Baseline: Goal status: INITIAL  2.  Pt will demonstrate a 50% reduction in L breast lymphedema as evidenced by decreased pore size.  Baseline:  Goal status: INITIAL  3.  Pt will  demonstrate a 75% improvement in fibrosis in inferior and medial breast to decrease risk of infection.  Baseline:  Goal status: INITIAL  4.  Pt  will report she is no longer having L axillary tightness with L shoulder end range of motion to improve comfort.  Baseline:  Goal status: INITIAL   PLAN: PT FREQUENCY: 2x/week  PT DURATION: 4 weeks  PLANNED INTERVENTIONS: Therapeutic exercises, Patient/Family education, Self Care, Orthotic/Fit training, Manual lymph drainage, Compression bandaging, scar mobilization, Taping, Vasopneumatic device, and Manual therapy  PLAN FOR NEXT SESSION: begin L breast MLD and instruct pt, see if chip pack helps, scar mobilization, MFR L axilla DEMOGRAPHICS sent to [place] 8/22 [place], PT 01/28/2022, 10:49 AM

## 2022-01-29 ENCOUNTER — Telehealth: Payer: Self-pay

## 2022-01-29 ENCOUNTER — Other Ambulatory Visit (HOSPITAL_BASED_OUTPATIENT_CLINIC_OR_DEPARTMENT_OTHER): Payer: Self-pay

## 2022-01-29 MED ORDER — CLOBETASOL PROPIONATE 0.05 % EX OINT
TOPICAL_OINTMENT | CUTANEOUS | 2 refills | Status: AC
  Filled 2022-01-29: qty 60, 10d supply, fill #0
  Filled 2022-06-03: qty 60, 10d supply, fill #1

## 2022-01-29 MED ORDER — TRAMADOL HCL 50 MG PO TABS
ORAL_TABLET | ORAL | 0 refills | Status: DC
  Filled 2022-01-29: qty 60, 30d supply, fill #0

## 2022-01-29 NOTE — Telephone Encounter (Signed)
Pt called stating that when she was last in clinic, Wilber Bihari, NP recommended the pt having a lung screening.  Pt stated she had an appt today with her PCP who ordered and scheduled her a lung screening.  Pt called to give Dr. Burr Medico and Wilber Bihari, NP an update on the lung screening recommendation.

## 2022-01-30 ENCOUNTER — Ambulatory Visit: Payer: Commercial Managed Care - HMO

## 2022-01-30 DIAGNOSIS — L599 Disorder of the skin and subcutaneous tissue related to radiation, unspecified: Secondary | ICD-10-CM

## 2022-01-30 DIAGNOSIS — Z170 Estrogen receptor positive status [ER+]: Secondary | ICD-10-CM

## 2022-01-30 DIAGNOSIS — I890 Lymphedema, not elsewhere classified: Secondary | ICD-10-CM

## 2022-01-30 DIAGNOSIS — R293 Abnormal posture: Secondary | ICD-10-CM

## 2022-01-30 NOTE — Therapy (Signed)
OUTPATIENT PHYSICAL THERAPY ONCOLOGY TREATMENT  Patient Name: Lisa [name] MRN: [id number] DOB:March 26, 1961, 61 y.o., female Today's Date: 01/30/2022   PT End of Session - 01/30/22 1003     Visit Number 4    Number of Visits 9    Date for PT Re-Evaluation 02/17/22    PT Start Time 1005    PT Stop Time 1053    PT Time Calculation (min) 48 min    Activity Tolerance Patient tolerated treatment well    Behavior During Therapy [facility] for tasks assessed/performed             Past Medical History:  Diagnosis Date   Anxiety    Arthritis    Breast cancer ([place]) 06/28/21   lump found   Breast cancer ([place])    left breast [facility]   Breast hematoma 12/02/2021   Breast mass, left 07/10/2021   Bulging disc L-5   Chicken pox    Depression    Diabetes mellitus    Hyperlipidemia    Hypertension    Insomnia    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    Meningitis 06/11/2021   bacterial meningitis   Polycystic ovarian disease    Past Surgical History:  Procedure Laterality Date   BREAST LUMPECTOMY Left 08/2021   BREAST LUMPECTOMY WITH SENTINEL LYMPH NODE BIOPSY Left 08/23/2021   Procedure: LEFT BREAST LUMPECTOMY WITH SENTINEL LYMPH NODE BIOPSY;  Surgeon: Stark Klein, MD;  Location: [place];  Service: General;  Laterality: Left;   csf leak in skull  06/10/2003   noted failed attempt to close CSF leak in skull   DILATION AND EVACUATION  06/09/1988   miscarriage   IR FLUORO GUIDED NEEDLE PLC ASPIRATION/INJECTION LOC  06/12/2021   RADIOLOGY WITH ANESTHESIA N/A 06/12/2021   Procedure: IR WITH ANESTHESIA;  Surgeon: Radiologist, Medication, MD;  Location: [place];  Service: Radiology;  Laterality: N/A;   TONSILLECTOMY AND ADENOIDECTOMY  06/10/1971   Patient Active Problem List   Diagnosis Date Noted   Hyperglycemia due to type 2 diabetes mellitus ([place]) 01/21/2022   Degeneration of lumbar intervertebral disc 01/21/2022   Breast hematoma  12/02/2021   Genetic testing 08/22/2021   Family history of breast cancer 08/14/2021   Malignant neoplasm of upper-outer quadrant of left breast in female, estrogen receptor positive ([place]) 08/12/2021   Breast mass, left 07/10/2021   Fever    Bacterial meningitis    Right sided weakness    Pressure injury of skin 06/13/2021   Sepsis due to Haemophilus influenzae with acute hypoxic respiratory failure and septic shock ([place])    Acute bacterial meningitis 62/70/3500   Acute metabolic encephalopathy 93/81/8299   Altered mental status 06/12/2021   Obesity, morbid, BMI 40.0-49.9 ([place]) 03/24/2016   Family history of breast cancer in sister 10/21/2013   Tick bite of back 10/21/2012   Screening for malignant neoplasm of the cervix 05/19/2012   Routine gynecological examination 05/19/2012   Tobacco use 12/30/2011   Diabetes mellitus, type 2 ([place]) 12/29/2011   HTN (hypertension) 12/29/2011   Hyperlipidemia 12/29/2011   CSF leak 12/29/2011   Psoriasis 12/29/2011   PCOS (polycystic ovarian syndrome) 12/29/2011   Depression with anxiety 12/29/2011    PCP: Collene Leyden, MD  REFERRING PROVIDER: Stark Klein, MD   REFERRING DIAG: [phone] (ICD-10-CM) - Other specified postprocedural states   THERAPY DIAG:  Lymphedema, not elsewhere classified  Disorder of the skin and subcutaneous tissue related to radiation, unspecified  Abnormal posture  Malignant neoplasm of upper-outer quadrant of left breast in female, estrogen receptor positive ([place])  ONSET DATE: 08/23/21  Rationale for Evaluation and Treatment Rehabilitation  SUBJECTIVE                                                                                                                                                                                           SUBJECTIVE STATEMENT: This morning it looked really good. The pores were not quite as large.The nipple area is still firm. The morning looks good, but swelling progresses as the day  goes on. Axillary tightness is improving. 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 She had negative margins and 1/5 positive nodes for a final path of pT1cN1a. She received adjuvant radiation. She was going to start anastrozole, but didn't. She was seen on 09/06/2021 with pain and swelling in the axilla, thought to have a possible seroma in the axilla did not attempt aspiration due to history of bacterial meningitis and possible risk of infection. DM, depression, HTN, obesity, arthritis    PAIN:  Are you having pain? No soreness/discomfort with raising my left arm  PRECAUTIONS: Other: L breast lymphedema, temporal bone malformation,  previous R rotator cuff tear  WEIGHT BEARING RESTRICTIONS No  FALLS:  Has patient fallen in last 6 months? No  LIVING ENVIRONMENT: Lives with: husband, daughter, son in law and 3 grandchildren  Lives in: House/apartment Stairs: Yes; pt reports she lives on the first floor, only has to navigate one step from driveway to porch Has following equipment at home: Single point cane, Environmental consultant - 2 wheeled, Environmental consultant - 4 wheeled, Manufacturing engineer  OCCUPATION: unemployed  LEISURE: pt does not exercise  HAND DOMINANCE : right   PRIOR LEVEL OF FUNCTION: Independent with community mobility with device  PATIENT GOALS to find out the best way to do the exercises and the massage   OBJECTIVE  COGNITION:  Overall cognitive status: Within functional limits for tasks assessed   PALPATION: Fibrosis in inferior L breast, increased scar tissue along mastectomy scar  OBSERVATIONS / OTHER ASSESSMENTS: increased pore size in L breast  POSTURE: forward head, rounded shoulders  UPPER EXTREMITY AROM/PROM:  A/PROM RIGHT   eval   Shoulder extension 75   Shoulder flexion 153  Shoulder abduction 151  Shoulder internal rotation 43  Shoulder external rotation 71    (Blank rows = not tested)  A/PROM LEFT   eval  Shoulder extension 74  Shoulder flexion 165  Shoulder abduction 176  Shoulder internal rotation 59  Shoulder external rotation 75    (Blank rows = not tested)    LYMPHEDEMA ASSESSMENTS:   SURGERY TYPE/DATE: L lumpectomy 08/23/21  NUMBER OF LYMPH NODES REMOVED: 1/5  CHEMOTHERAPY: did not require  RADIATION: completed in 2023  HORMONE TREATMENT: did not take  INFECTIONS: possible infection of seroma in L breast at the end of March 2023 that required antibiotics   LYMPHEDEMA ASSESSMENTS:   LANDMARK RIGHT  eval  10 cm proximal to olecranon process 30.9  Olecranon process 26.5  10 cm proximal to ulnar styloid process 22.5  Just proximal to ulnar styloid process 17.2  Across hand at thumb web space 19.7  At base of 2nd digit 6.5  (Blank rows = not tested)  LANDMARK LEFT  eval  10 cm proximal to olecranon process 31.2  Olecranon process 27.5  10 cm proximal to ulnar styloid process 23.2  Just proximal to ulnar styloid process 17.5  Across hand at thumb web space 20.5  At base of 2nd digit 6.4  (Blank rows = not tested)    Breast Scale: 61   TODAY'S TREATMENT  01/30/2022 AA shoulder flexion x 5; less tightness in axilla Pt performed MLD to supraclavicular region, 5 diaphragmatic breaths, right and left axillary LN, left inguinal LN's, anterior interaxillary pathway, left axillo-inguinal pathway, left breast directing toward appropriate pathways and ending with LN's. VC's and TC's given by PT for pressure and technique.  PT also performed some MLD to the left breast and pathways and ended with LN's  01/28/2022  MLD to supraclavicular region, 5 diaphragmatic breaths, right and left axillary LN, left inguinal LN's, anterior interaxillary pathway, left axillo-inguinal pathway, left breast directing toward  appropriate pathways and ending with LN's. Pt was instructed in all techniques and performed each several times with VC's and TC's . Gentle scar massage to areola incision. Pt is seeing the benefit of MLD    01/22/2022 AA shoulder flexion with clasped hands x 5 to decrease pain at axillary region. MLD to supraclavicular region, right axillary LN, left inguinal LN's, anterior interaxillary pathway, left axillo-inguinal pathway, left breast directing toward appropriate pathways and ending with LN's. Pt was instructed in all motions and performed each several times with VC's and TC's . Gentle scar massage    01/20/2022 Educated pt to wear chip pack in her compression bra, educated pt about availability of a compression pump  PATIENT EDUCATION:  Education details: wear chip pack in compression bra, compression pump Person educated: Patient Education method: Explanation Education comprehension: verbalized understanding   HOME EXERCISE PROGRAM: Wear chip pack in compression bra  ASSESSMENT:         CLINICAL IMPRESSIONS; Pt with Mild redness left breast but does not appear infected. Pt performed all MLD with intermittent VC's and TC's. She requires cues to keep knuckles flatter and to use less pressure. She has some difficulty reaching the left axillo-inguinal pathway with her right hand, but can use right hand for the upper part and left hand from the breast down.Marland Kitchen good improvement in left axillary tightness    IMPAIRMENTS decreased knowledge of condition, increased edema, increased fascial restrictions, and postural dysfunction.   ACTIVITY LIMITATIONS reach over head  PARTICIPATION LIMITATIONS:  none  PERSONAL FACTORS Fitness, Time since onset of injury/illness/exacerbation, and 1-2 comorbidities: bacterial meningitis, L breast infection  are also affecting patient's functional outcome.   REHAB POTENTIAL: Good  CLINICAL DECISION  MAKING: Stable/uncomplicated  EVALUATION  COMPLEXITY: Low  GOALS: Goals reviewed with patient? Yes  SHORT TERM GOALS=LONG TERM GOALS  Target date: 02/17/2022    Pt will be independent in self MLD for long term management of lymphedema.  Baseline: Goal status: INITIAL  2.  Pt will demonstrate a 50% reduction in L breast lymphedema as evidenced by decreased pore size.  Baseline:  Goal status: INITIAL  3.  Pt will demonstrate a 75% improvement in fibrosis in inferior and medial breast to decrease risk of infection.  Baseline:  Goal status: INITIAL  4.  Pt will report she is no longer having L axillary tightness with L shoulder end range of motion to improve comfort.  Baseline:  Goal status: INITIAL   PLAN: PT FREQUENCY: 2x/week  PT DURATION: 4 weeks  PLANNED INTERVENTIONS: Therapeutic exercises, Patient/Family education, Self Care, Orthotic/Fit training, Manual lymph drainage, Compression bandaging, scar mobilization, Taping, Vasopneumatic device, and Manual therapy  PLAN FOR NEXT SESSION: begin L breast MLD and instruct pt, see if chip pack helps, scar mobilization, MFR L axilla DEMOGRAPHICS sent to [facility] 8/22 RW. Check goals  Claris Pong, PT 01/30/2022, 10:56 AM

## 2022-01-31 ENCOUNTER — Other Ambulatory Visit (HOSPITAL_BASED_OUTPATIENT_CLINIC_OR_DEPARTMENT_OTHER): Payer: Self-pay

## 2022-02-03 ENCOUNTER — Ambulatory Visit: Payer: Commercial Managed Care - HMO | Admitting: Physical Therapy

## 2022-02-03 ENCOUNTER — Other Ambulatory Visit (HOSPITAL_BASED_OUTPATIENT_CLINIC_OR_DEPARTMENT_OTHER): Payer: Self-pay

## 2022-02-03 MED ORDER — SPIRONOLACTONE 25 MG PO TABS
25.0000 mg | ORAL_TABLET | Freq: Every day | ORAL | 1 refills | Status: DC
  Filled 2022-02-03: qty 30, 30d supply, fill #0
  Filled 2022-03-10: qty 30, 30d supply, fill #1
  Filled 2022-04-08: qty 30, 30d supply, fill #2
  Filled 2022-05-05: qty 30, 30d supply, fill #3

## 2022-02-04 ENCOUNTER — Other Ambulatory Visit (HOSPITAL_BASED_OUTPATIENT_CLINIC_OR_DEPARTMENT_OTHER): Payer: Self-pay

## 2022-02-06 ENCOUNTER — Other Ambulatory Visit (HOSPITAL_BASED_OUTPATIENT_CLINIC_OR_DEPARTMENT_OTHER): Payer: Self-pay

## 2022-02-06 ENCOUNTER — Encounter: Payer: Commercial Managed Care - HMO | Admitting: Physical Therapy

## 2022-02-06 MED ORDER — DICLOFENAC SODIUM 75 MG PO TBEC
75.0000 mg | DELAYED_RELEASE_TABLET | Freq: Two times a day (BID) | ORAL | 0 refills | Status: DC | PRN
  Filled 2022-02-06: qty 60, 30d supply, fill #0

## 2022-02-07 ENCOUNTER — Other Ambulatory Visit (HOSPITAL_BASED_OUTPATIENT_CLINIC_OR_DEPARTMENT_OTHER): Payer: Self-pay

## 2022-02-11 ENCOUNTER — Ambulatory Visit: Payer: Commercial Managed Care - HMO | Attending: General Surgery

## 2022-02-11 DIAGNOSIS — C50412 Malignant neoplasm of upper-outer quadrant of left female breast: Secondary | ICD-10-CM | POA: Insufficient documentation

## 2022-02-11 DIAGNOSIS — I890 Lymphedema, not elsewhere classified: Secondary | ICD-10-CM | POA: Insufficient documentation

## 2022-02-11 DIAGNOSIS — R293 Abnormal posture: Secondary | ICD-10-CM | POA: Diagnosis present

## 2022-02-11 DIAGNOSIS — Z170 Estrogen receptor positive status [ER+]: Secondary | ICD-10-CM | POA: Diagnosis present

## 2022-02-11 DIAGNOSIS — L599 Disorder of the skin and subcutaneous tissue related to radiation, unspecified: Secondary | ICD-10-CM | POA: Insufficient documentation

## 2022-02-11 NOTE — Therapy (Signed)
OUTPATIENT PHYSICAL THERAPY ONCOLOGY TREATMENT  Patient Name: Lisa [name] MRN: [id number] DOB:10-14-1960, 61 y.o., female Today's Date: 02/11/2022   PT End of Session - 02/11/22 0955     Visit Number 5    Number of Visits 9    Date for PT Re-Evaluation 02/17/22    PT Start Time 1000    PT Stop Time 1047    PT Time Calculation (min) 47 min    Activity Tolerance Patient tolerated treatment well    Behavior During Therapy [facility] for tasks assessed/performed             Past Medical History:  Diagnosis Date   Anxiety    Arthritis    Breast cancer ([place]) 06/28/21   lump found   Breast cancer ([place])    left breast [facility]   Breast hematoma 12/02/2021   Breast mass, left 07/10/2021   Bulging disc L-5   Chicken pox    Depression    Diabetes mellitus    Hyperlipidemia    Hypertension    Insomnia    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    Meningitis 06/11/2021   bacterial meningitis   Polycystic ovarian disease    Past Surgical History:  Procedure Laterality Date   BREAST LUMPECTOMY Left 08/2021   BREAST LUMPECTOMY WITH SENTINEL LYMPH NODE BIOPSY Left 08/23/2021   Procedure: LEFT BREAST LUMPECTOMY WITH SENTINEL LYMPH NODE BIOPSY;  Surgeon: Stark Klein, MD;  Location: [place];  Service: General;  Laterality: Left;   csf leak in skull  06/10/2003   noted failed attempt to close CSF leak in skull   DILATION AND EVACUATION  06/09/1988   miscarriage   IR FLUORO GUIDED NEEDLE PLC ASPIRATION/INJECTION LOC  06/12/2021   RADIOLOGY WITH ANESTHESIA N/A 06/12/2021   Procedure: IR WITH ANESTHESIA;  Surgeon: Radiologist, Medication, MD;  Location: [place];  Service: Radiology;  Laterality: N/A;   TONSILLECTOMY AND ADENOIDECTOMY  06/10/1971   Patient Active Problem List   Diagnosis Date Noted   Hyperglycemia due to type 2 diabetes mellitus ([place]) 01/21/2022   Degeneration of lumbar intervertebral disc 01/21/2022   Breast hematoma  12/02/2021   Genetic testing 08/22/2021   Family history of breast cancer 08/14/2021   Malignant neoplasm of upper-outer quadrant of left breast in female, estrogen receptor positive ([place]) 08/12/2021   Breast mass, left 07/10/2021   Fever    Bacterial meningitis    Right sided weakness    Pressure injury of skin 06/13/2021   Sepsis due to Haemophilus influenzae with acute hypoxic respiratory failure and septic shock ([place])    Acute bacterial meningitis 54/49/2010   Acute metabolic encephalopathy 12/18/1973   Altered mental status 06/12/2021   Obesity, morbid, BMI 40.0-49.9 ([place]) 03/24/2016   Family history of breast cancer in sister 10/21/2013   Tick bite of back 10/21/2012   Screening for malignant neoplasm of the cervix 05/19/2012   Routine gynecological examination 05/19/2012   Tobacco use 12/30/2011   Diabetes mellitus, type 2 ([place]) 12/29/2011   HTN (hypertension) 12/29/2011   Hyperlipidemia 12/29/2011   CSF leak 12/29/2011   Psoriasis 12/29/2011   PCOS (polycystic ovarian syndrome) 12/29/2011   Depression with anxiety 12/29/2011    PCP: Collene Leyden, MD  REFERRING PROVIDER: Stark Klein, MD   REFERRING DIAG: [phone] (ICD-10-CM) - Other specified postprocedural states   THERAPY DIAG:  Lymphedema, not elsewhere classified  Disorder of the skin and subcutaneous tissue related to radiation, unspecified  Abnormal posture  Malignant neoplasm of upper-outer quadrant of left breast in female, estrogen receptor positive ([place])  ONSET DATE: 08/23/21  Rationale for Evaluation and Treatment Rehabilitation  SUBJECTIVE                                                                                                                                                                                           SUBJECTIVE STATEMENT: I fell last Sunday so missed my appts last week. My left leg gave out and I hit my head on the wall. The girl comes tomorrow about the pump. I am covered 100%  if its medically necessary.  I only have 30 visits per year with my insurance. (12 visits used counting today) I feel like I need to work on my legs/back. I have been doing the MLD. I can't tell if the compression bra is worse or not.   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 She had negative margins and 1/5 positive nodes for a final path of pT1cN1a. She received adjuvant radiation. She was going to start anastrozole, but didn't. She was seen on 09/06/2021 with pain and swelling in the axilla, thought to have a possible seroma in the axilla did not attempt aspiration due to history of bacterial meningitis and possible risk of infection. DM, depression, HTN, obesity, arthritis    PAIN:  Are you having pain? No and no soreness/discomfort with raising my left arm  PRECAUTIONS: Other: L breast lymphedema, temporal bone malformation,  previous R rotator cuff tear  WEIGHT BEARING RESTRICTIONS No  FALLS:  Has patient fallen in last 6 months? No  LIVING ENVIRONMENT: Lives with: husband, daughter, son in law and 3 grandchildren  Lives in: House/apartment Stairs: Yes; pt reports she lives on the first floor, only has to navigate one step from driveway to porch Has following equipment at home: Single point cane, Environmental consultant - 2 wheeled, Environmental consultant - 4 wheeled, Manufacturing engineer  OCCUPATION: unemployed  LEISURE: pt does not exercise  HAND DOMINANCE : right   PRIOR LEVEL OF FUNCTION: Independent with community mobility with device  PATIENT GOALS to find out the best way to do the exercises and the massage   OBJECTIVE  COGNITION:  Overall cognitive status: Within functional limits for tasks assessed   PALPATION: Fibrosis in inferior L breast, increased scar tissue along mastectomy scar  OBSERVATIONS / OTHER ASSESSMENTS:  increased pore size in L breast  POSTURE: forward head, rounded shoulders  UPPER EXTREMITY AROM/PROM:  A/PROM  RIGHT   eval   Shoulder extension 75  Shoulder flexion 153  Shoulder abduction 151  Shoulder internal rotation 43  Shoulder external rotation 71    (Blank rows = not tested)  A/PROM LEFT   eval  Shoulder extension 74  Shoulder flexion 165  Shoulder abduction 176  Shoulder internal rotation 59  Shoulder external rotation 75    (Blank rows = not tested)    LYMPHEDEMA ASSESSMENTS:   SURGERY TYPE/DATE: L lumpectomy 08/23/21  NUMBER OF LYMPH NODES REMOVED: 1/5  CHEMOTHERAPY: did not require  RADIATION: completed in 2023  HORMONE TREATMENT: did not take  INFECTIONS: possible infection of seroma in L breast at the end of March 2023 that required antibiotics   LYMPHEDEMA ASSESSMENTS:   LANDMARK RIGHT  eval  10 cm proximal to olecranon process 30.9  Olecranon process 26.5  10 cm proximal to ulnar styloid process 22.5  Just proximal to ulnar styloid process 17.2  Across hand at thumb web space 19.7  At base of 2nd digit 6.5  (Blank rows = not tested)  LANDMARK LEFT  eval  10 cm proximal to olecranon process 31.2  Olecranon process 27.5  10 cm proximal to ulnar styloid process 23.2  Just proximal to ulnar styloid process 17.5  Across hand at thumb web space 20.5  At base of 2nd digit 6.4  (Blank rows = not tested)    Breast Scale: 61   TODAY'S TREATMENT   02/11/2022 Significant enlarged pores noted.in left breast. Fibrosis at inferior medial breast, and scar tissue under areola incision Pt performed MLD to supraclavicular region, 5 diaphragmatic breaths, right and left axillary LN, left inguinal LN's, anterior interaxillary pathway, left axillo-inguinal pathway, left breast directing toward appropriate pathways and ending with LN's. VC's and TC's given by PT for pressure and technique.  PT also performed  scar mobilization to areola incision and some  MLD to the left breast and pathways and ended with LN's  01/30/2022 AA shoulder flexion x 5; less tightness in axilla Pt performed MLD to supraclavicular region, 5 diaphragmatic breaths, right and left axillary LN, left inguinal LN's, anterior interaxillary pathway, left axillo-inguinal pathway, left breast directing toward appropriate pathways and ending with LN's. VC's and TC's given by PT for pressure and technique.  PT also performed some MLD to the left breast and pathways and ended with LN's  01/28/2022  MLD to supraclavicular region, 5 diaphragmatic breaths, right and left axillary LN, left inguinal LN's, anterior interaxillary pathway, left axillo-inguinal pathway, left breast directing toward appropriate pathways and ending with LN's. Pt was instructed in all techniques and performed each several times with VC's and TC's . Gentle scar massage to areola incision. Pt is seeing the benefit of MLD    01/22/2022 AA shoulder flexion with clasped hands x 5 to decrease pain at axillary region. MLD to supraclavicular region, right axillary LN, left inguinal LN's, anterior interaxillary pathway, left axillo-inguinal pathway, left breast directing toward appropriate pathways and ending with LN's. Pt was instructed in all motions and performed each several times with VC's and TC's . Gentle scar massage    01/20/2022 Educated pt to wear chip pack in her compression bra, educated pt about availability of a compression pump  PATIENT EDUCATION:  Education details: wear chip pack in compression bra, compression pump Person educated: Patient Education method: Explanation Education comprehension: verbalized understanding   HOME EXERCISE PROGRAM: Wear chip pack in compression bra  ASSESSMENT:         CLINICAL  IMPRESSIONS; Pt with continued Mild redness left breast but does not appear infected. Pt performed all MLD with occasional VC's and TC's. She contines to  require intermittent cues to keep  knuckles flatter and to use less pressure. She has some difficulty reaching the left axillo-inguinal pathway with her right hand, but can use right hand for the upper part and left hand from the breast down.. She also likes to lie in Right SL to do the left side as it is easier on her right shoulder than reaching across.  Medial Fibrosis improved after MLD.Pt will have her [employer]. Pt wants limited visits so she can have therapy on her legs and back.    IMPAIRMENTS decreased knowledge of condition, increased edema, increased fascial restrictions, and postural dysfunction.   ACTIVITY LIMITATIONS reach over head  PARTICIPATION LIMITATIONS:  none  PERSONAL FACTORS Fitness, Time since onset of injury/illness/exacerbation, and 1-2 comorbidities: bacterial meningitis, L breast infection  are also affecting patient's functional outcome.   REHAB POTENTIAL: Good  CLINICAL DECISION MAKING: Stable/uncomplicated  EVALUATION COMPLEXITY: Low  GOALS: Goals reviewed with patient? Yes  SHORT TERM GOALS=LONG TERM GOALS  Target date: 02/17/2022    Pt will be independent in self MLD for long term management of lymphedema.  Baseline: Goal status: INITIAL  2.  Pt will demonstrate a 50% reduction in L breast lymphedema as evidenced by decreased pore size.  Baseline:  Goal status: INITIAL  3.  Pt will demonstrate a 75% improvement in fibrosis in inferior and medial breast to decrease risk of infection.  Baseline:  Goal status: MET 02/11/2022  4.  Pt will report she is no longer having L axillary tightness with L shoulder end range of motion to improve comfort.  Baseline:  Goal status:MET 02/11/2022 PLAN: PT FREQUENCY: 2x/week  PT DURATION: 4 weeks  PLANNED INTERVENTIONS: Therapeutic exercises, Patient/Family education, Self Care, Orthotic/Fit training, Manual lymph drainage, Compression bandaging, scar mobilization, Taping, Vasopneumatic device, and Manual therapy  PLAN FOR NEXT SESSION:  How was Flexi demo? Continue  L breast MLD and instruct pt, see if chip pack helps, scar mobilization, MFR L axilla DEMOGRAPHICS sent to [facility] 8/22 RW. Check goals  Claris Pong, PT 02/11/2022, 10:48 AM

## 2022-02-13 ENCOUNTER — Ambulatory Visit: Payer: Commercial Managed Care - HMO

## 2022-02-13 DIAGNOSIS — I890 Lymphedema, not elsewhere classified: Secondary | ICD-10-CM | POA: Diagnosis not present

## 2022-02-13 DIAGNOSIS — L599 Disorder of the skin and subcutaneous tissue related to radiation, unspecified: Secondary | ICD-10-CM

## 2022-02-13 DIAGNOSIS — R293 Abnormal posture: Secondary | ICD-10-CM

## 2022-02-13 NOTE — Therapy (Signed)
OUTPATIENT PHYSICAL THERAPY ONCOLOGY TREATMENT  Patient Name: Lisa [name] MRN: [id number] DOB:1960/10/24, 61 y.o., female Today's Date: 02/13/2022   PT End of Session - 02/13/22 1012     Visit Number 6    Number of Visits 9    Date for PT Re-Evaluation 02/17/22    PT Start Time 1006    PT Stop Time 1100    PT Time Calculation (min) 54 min    Activity Tolerance Patient tolerated treatment well    Behavior During Therapy [facility] for tasks assessed/performed             Past Medical History:  Diagnosis Date   Anxiety    Arthritis    Breast cancer ([place]) 06/28/21   lump found   Breast cancer ([place])    left breast [facility]   Breast hematoma 12/02/2021   Breast mass, left 07/10/2021   Bulging disc L-5   Chicken pox    Depression    Diabetes mellitus    Hyperlipidemia    Hypertension    Insomnia    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    Meningitis 06/11/2021   bacterial meningitis   Polycystic ovarian disease    Past Surgical History:  Procedure Laterality Date   BREAST LUMPECTOMY Left 08/2021   BREAST LUMPECTOMY WITH SENTINEL LYMPH NODE BIOPSY Left 08/23/2021   Procedure: LEFT BREAST LUMPECTOMY WITH SENTINEL LYMPH NODE BIOPSY;  Surgeon: Stark Klein, MD;  Location: [place];  Service: General;  Laterality: Left;   csf leak in skull  06/10/2003   noted failed attempt to close CSF leak in skull   DILATION AND EVACUATION  06/09/1988   miscarriage   IR FLUORO GUIDED NEEDLE PLC ASPIRATION/INJECTION LOC  06/12/2021   RADIOLOGY WITH ANESTHESIA N/A 06/12/2021   Procedure: IR WITH ANESTHESIA;  Surgeon: Radiologist, Medication, MD;  Location: [place];  Service: Radiology;  Laterality: N/A;   TONSILLECTOMY AND ADENOIDECTOMY  06/10/1971   Patient Active Problem List   Diagnosis Date Noted   Hyperglycemia due to type 2 diabetes mellitus ([place]) 01/21/2022   Degeneration of lumbar intervertebral disc 01/21/2022   Breast hematoma  12/02/2021   Genetic testing 08/22/2021   Family history of breast cancer 08/14/2021   Malignant neoplasm of upper-outer quadrant of left breast in female, estrogen receptor positive ([place]) 08/12/2021   Breast mass, left 07/10/2021   Fever    Bacterial meningitis    Right sided weakness    Pressure injury of skin 06/13/2021   Sepsis due to Haemophilus influenzae with acute hypoxic respiratory failure and septic shock ([place])    Acute bacterial meningitis 08/65/7846   Acute metabolic encephalopathy 96/29/5284   Altered mental status 06/12/2021   Obesity, morbid, BMI 40.0-49.9 ([place]) 03/24/2016   Family history of breast cancer in sister 10/21/2013   Tick bite of back 10/21/2012   Screening for malignant neoplasm of the cervix 05/19/2012   Routine gynecological examination 05/19/2012   Tobacco use 12/30/2011   Diabetes mellitus, type 2 ([place]) 12/29/2011   HTN (hypertension) 12/29/2011   Hyperlipidemia 12/29/2011   CSF leak 12/29/2011   Psoriasis 12/29/2011   PCOS (polycystic ovarian syndrome) 12/29/2011   Depression with anxiety 12/29/2011    PCP: Collene Leyden, MD  REFERRING PROVIDER: Stark Klein, MD   REFERRING DIAG: [phone] (ICD-10-CM) - Other specified postprocedural states   THERAPY DIAG:  Lymphedema, not elsewhere classified  Disorder of the skin and subcutaneous tissue related to radiation, unspecified  Abnormal posture  ONSET DATE: 08/23/21  Rationale for Evaluation and Treatment Rehabilitation  SUBJECTIVE                                                                                                                                                                                           SUBJECTIVE STATEMENT: I'm actually feeling pretty good today and the last 2 days have been good also. I went to the grocery store and I haven't done that in awhile. I had the [place] which went fine but insurance won't pay for the chest piece until I have the arm piece for 4  weeks, so I'm going to do that.   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 She had negative margins and 1/5 positive nodes for a final path of pT1cN1a. She received adjuvant radiation. She was going to start anastrozole, but didn't. She was seen on 09/06/2021 with pain and swelling in the axilla, thought to have a possible seroma in the axilla did not attempt aspiration due to history of bacterial meningitis and possible risk of infection. DM, depression, HTN, obesity, arthritis    PAIN:  Are you having pain? No and no soreness/discomfort with raising my left arm  PRECAUTIONS: Other: L breast lymphedema, temporal bone malformation,  previous R rotator cuff tear  WEIGHT BEARING RESTRICTIONS No  FALLS:  Has patient fallen in last 6 months? No  LIVING ENVIRONMENT: Lives with: husband, daughter, son in law and 3 grandchildren  Lives in: House/apartment Stairs: Yes; pt reports she lives on the first floor, only has to navigate one step from driveway to porch Has following equipment at home: Single point cane, Environmental consultant - 2 wheeled, Environmental consultant - 4 wheeled, Manufacturing engineer  OCCUPATION: unemployed  LEISURE: pt does not exercise  HAND DOMINANCE : right   PRIOR LEVEL OF FUNCTION: Independent with community mobility with device  PATIENT GOALS to find out the best way to do the exercises and the massage   OBJECTIVE  COGNITION:  Overall cognitive status: Within functional limits for tasks assessed   PALPATION: Fibrosis in inferior L breast, increased scar tissue along mastectomy scar  OBSERVATIONS / OTHER ASSESSMENTS: increased pore size in L breast  POSTURE: forward head, rounded shoulders  UPPER EXTREMITY AROM/PROM:  A/PROM RIGHT   eval   Shoulder extension 75  Shoulder flexion 153  Shoulder abduction 151   Shoulder internal rotation 43  Shoulder external rotation 71    (Blank rows = not tested)  A/PROM  LEFT   eval  Shoulder extension 74  Shoulder flexion 165  Shoulder abduction 176  Shoulder internal rotation 59  Shoulder external rotation 75    (Blank rows = not tested)    LYMPHEDEMA ASSESSMENTS:   SURGERY TYPE/DATE: L lumpectomy 08/23/21  NUMBER OF LYMPH NODES REMOVED: 1/5  CHEMOTHERAPY: did not require  RADIATION: completed in 2023  HORMONE TREATMENT: did not take  INFECTIONS: possible infection of seroma in L breast at the end of March 2023 that required antibiotics   LYMPHEDEMA ASSESSMENTS:   LANDMARK RIGHT  eval  10 cm proximal to olecranon process 30.9  Olecranon process 26.5  10 cm proximal to ulnar styloid process 22.5  Just proximal to ulnar styloid process 17.2  Across hand at thumb web space 19.7  At base of 2nd digit 6.5  (Blank rows = not tested)  LANDMARK LEFT  eval  10 cm proximal to olecranon process 31.2  Olecranon process 27.5  10 cm proximal to ulnar styloid process 23.2  Just proximal to ulnar styloid process 17.5  Across hand at thumb web space 20.5  At base of 2nd digit 6.4  (Blank rows = not tested)    Breast Scale: 61   TODAY'S TREATMENT  02/13/22: Manual Therapy Pt performed MLD to supraclavicular region, then therapist performed superficial and deep abdominals, right axillary LN, Rt intact upper quadrant sequence, then left inguinal LN's, anterior interaxillary pathway, left axillo-inguinal pathway, left breast directing toward appropriate pathways, Rt S/L for further work to lateral breast and then finished retracing steps in supine and ending with LN's.  Also performed scar mobilization to areola incision during MLD MFR to Lt axilla with LT UE in end ROM of flexion and abduction with scapular depression throughout  02/11/2022 Significant enlarged pores noted.in left breast. Fibrosis at inferior medial breast, and scar tissue  under areola incision Pt performed MLD to supraclavicular region, 5 diaphragmatic breaths, right and left axillary LN, left inguinal LN's, anterior interaxillary pathway, left axillo-inguinal pathway, left breast directing toward appropriate pathways and ending with LN's. VC's and TC's given by PT for pressure and technique.  PT also performed  scar mobilization to areola incision and some MLD to the left breast and pathways and ended with LN's  01/30/2022 AA shoulder flexion x 5; less tightness in axilla Pt performed MLD to supraclavicular region, 5 diaphragmatic breaths, right and left axillary LN, left inguinal LN's, anterior interaxillary pathway, left axillo-inguinal pathway, left breast directing toward appropriate pathways and ending with LN's. VC's and TC's given by PT for pressure and technique.  PT also performed some MLD to the left breast and pathways and ended with LN's     01/20/2022 Educated pt to wear chip pack in her compression bra, educated pt about availability of a compression pump  PATIENT EDUCATION:  Education details: wear chip pack in compression bra, compression pump Person educated: Patient Education method: Explanation Education comprehension: verbalized understanding   HOME EXERCISE PROGRAM: Wear chip pack in compression bra  ASSESSMENT:         CLINICAL IMPRESSIONS; Continued MLD to Lt breast. Pts redness seemed some improved today as wasn't noticeable to this therapist and pt agreed it was improved since last session here. Though improved, still presents with some fibrosis to inferior aspect of breast with peau d'orange. She has worn the chip pack but not for longer than about an hour at a time so encouraged her to try wearing this for a few hours with her compression bra  to see if she notices more of a difference, reminding her that the lymph system is a very slow moving system so she may need to wear it for longer periods to notice a difference. She has done  well with wearing her compression bra consistently .She verbalized understanding. Despite pt wearing her compression bra for greater than 4 weeks, since April 2023, and with the addition of compression foam, she still presents with peau d'orange (increased pore size) and fibrosis at inferior aspect of breast. 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 Patient would benefit from a Flexitouch due to failure of four weeks or more of conservative therapies with compression, exercise (post op stretches) and elevation (of the Lt UE with the post op A/ROM stretches) and continued presence of skin changes and fibrosis.      IMPAIRMENTS decreased knowledge of condition, increased edema, increased fascial restrictions, and postural dysfunction.   ACTIVITY LIMITATIONS reach over head  PARTICIPATION LIMITATIONS:  none  PERSONAL FACTORS Fitness, Time since onset of injury/illness/exacerbation, and 1-2 comorbidities: bacterial meningitis, L breast infection  are also affecting patient's functional outcome.   REHAB POTENTIAL: Good  CLINICAL DECISION MAKING: Stable/uncomplicated  EVALUATION COMPLEXITY: Low  GOALS: Goals reviewed with patient? Yes  SHORT TERM GOALS=LONG TERM GOALS  Target date: 02/17/2022    Pt will be independent in self MLD for long term management of lymphedema.  Baseline: Goal status: INITIAL  2.  Pt will demonstrate a 50% reduction in L breast lymphedema as evidenced by decreased pore size.  Baseline:  Goal status: INITIAL  3.  Pt will demonstrate a 75% improvement in fibrosis in inferior and medial breast to decrease risk of infection.  Baseline:  Goal status: MET 02/11/2022  4.  Pt will report she is no longer having L axillary tightness with L shoulder end range of motion to improve comfort.  Baseline:  Goal status:MET 02/11/2022 PLAN: PT FREQUENCY: 2x/week  PT DURATION: 4 weeks  PLANNED INTERVENTIONS:  Therapeutic exercises, Patient/Family education, Self Care, Orthotic/Fit training, Manual lymph drainage, Compression bandaging, scar mobilization, Taping, Vasopneumatic device, and Manual therapy  PLAN FOR NEXT SESSION: Continue Lt  breast MLD and instruct pt, see if chip pack helps, scar mobilization, MFR L axilla DEMOGRAPHICS sent to [facility] 8/22 RW. Check goals  Otelia Limes, PTA 02/13/2022, 11:00 AM

## 2022-02-17 ENCOUNTER — Encounter: Payer: Self-pay | Admitting: Rehabilitation

## 2022-02-17 ENCOUNTER — Ambulatory Visit: Payer: Commercial Managed Care - HMO | Admitting: Rehabilitation

## 2022-02-17 ENCOUNTER — Other Ambulatory Visit (HOSPITAL_BASED_OUTPATIENT_CLINIC_OR_DEPARTMENT_OTHER): Payer: Self-pay

## 2022-02-17 DIAGNOSIS — I890 Lymphedema, not elsewhere classified: Secondary | ICD-10-CM | POA: Diagnosis not present

## 2022-02-17 DIAGNOSIS — L599 Disorder of the skin and subcutaneous tissue related to radiation, unspecified: Secondary | ICD-10-CM

## 2022-02-17 NOTE — Therapy (Signed)
OUTPATIENT PHYSICAL THERAPY ONCOLOGY TREATMENT  Patient Name: Lisa [name] MRN: [id number] DOB:06/05/1961, 61 y.o., female Today's Date: 02/17/2022   PT End of Session - 02/17/22 0957     Visit Number 7    Number of Visits 9    Date for PT Re-Evaluation 02/17/22    PT Start Time 1000    PT Stop Time 1045    PT Time Calculation (min) 45 min    Activity Tolerance Patient tolerated treatment well    Behavior During Therapy [facility] for tasks assessed/performed              Past Medical History:  Diagnosis Date   Anxiety    Arthritis    Breast cancer ([place]) 06/28/21   lump found   Breast cancer ([place])    left breast [facility]   Breast hematoma 12/02/2021   Breast mass, left 07/10/2021   Bulging disc L-5   Chicken pox    Depression    Diabetes mellitus    Hyperlipidemia    Hypertension    Insomnia    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    Meningitis 06/11/2021   bacterial meningitis   Polycystic ovarian disease    Past Surgical History:  Procedure Laterality Date   BREAST LUMPECTOMY Left 08/2021   BREAST LUMPECTOMY WITH SENTINEL LYMPH NODE BIOPSY Left 08/23/2021   Procedure: LEFT BREAST LUMPECTOMY WITH SENTINEL LYMPH NODE BIOPSY;  Surgeon: Stark Klein, MD;  Location: [place];  Service: General;  Laterality: Left;   csf leak in skull  06/10/2003   noted failed attempt to close CSF leak in skull   DILATION AND EVACUATION  06/09/1988   miscarriage   IR FLUORO GUIDED NEEDLE PLC ASPIRATION/INJECTION LOC  06/12/2021   RADIOLOGY WITH ANESTHESIA N/A 06/12/2021   Procedure: IR WITH ANESTHESIA;  Surgeon: Radiologist, Medication, MD;  Location: [place];  Service: Radiology;  Laterality: N/A;   TONSILLECTOMY AND ADENOIDECTOMY  06/10/1971   Patient Active Problem List   Diagnosis Date Noted   Hyperglycemia due to type 2 diabetes mellitus ([place]) 01/21/2022   Degeneration of lumbar intervertebral disc 01/21/2022   Breast hematoma  12/02/2021   Genetic testing 08/22/2021   Family history of breast cancer 08/14/2021   Malignant neoplasm of upper-outer quadrant of left breast in female, estrogen receptor positive ([place]) 08/12/2021   Breast mass, left 07/10/2021   Fever    Bacterial meningitis    Right sided weakness    Pressure injury of skin 06/13/2021   Sepsis due to Haemophilus influenzae with acute hypoxic respiratory failure and septic shock ([place])    Acute bacterial meningitis 23/30/0762   Acute metabolic encephalopathy 26/33/3545   Altered mental status 06/12/2021   Obesity, morbid, BMI 40.0-49.9 ([place]) 03/24/2016   Family history of breast cancer in sister 10/21/2013   Tick bite of back 10/21/2012   Screening for malignant neoplasm of the cervix 05/19/2012   Routine gynecological examination 05/19/2012   Tobacco use 12/30/2011   Diabetes mellitus, type 2 ([place]) 12/29/2011   HTN (hypertension) 12/29/2011   Hyperlipidemia 12/29/2011   CSF leak 12/29/2011   Psoriasis 12/29/2011   PCOS (polycystic ovarian syndrome) 12/29/2011   Depression with anxiety 12/29/2011    PCP: Collene Leyden, MD  REFERRING PROVIDER: Stark Klein, MD   REFERRING DIAG: [phone] (ICD-10-CM) - Other specified postprocedural states   THERAPY DIAG:  Lymphedema, not elsewhere classified  Disorder of the skin and subcutaneous tissue related to radiation,  unspecified  ONSET DATE: 08/23/21  Rationale for Evaluation and Treatment Rehabilitation  SUBJECTIVE                                                                                                                                                                                           SUBJECTIVE STATEMENT: Nothing new.  The pain has gotten better.    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 She had negative  margins and 1/5 positive nodes for a final path of pT1cN1a. She received adjuvant radiation. She was going to start anastrozole, but didn't. She was seen on 09/06/2021 with pain and swelling in the axilla, thought to have a possible seroma in the axilla did not attempt aspiration due to history of bacterial meningitis and possible risk of infection. DM, depression, HTN, obesity, arthritis   PAIN:  Are you having pain? No and no soreness/discomfort with raising my left arm  PRECAUTIONS: Other: L breast lymphedema, temporal bone malformation,  previous R rotator cuff tear  WEIGHT BEARING RESTRICTIONS No  FALLS:  Has patient fallen in last 6 months? No  LIVING ENVIRONMENT: Lives with: husband, daughter, son in law and 3 grandchildren  Lives in: House/apartment Stairs: Yes; pt reports she lives on the first floor, only has to navigate one step from driveway to porch Has following equipment at home: Single point cane, Environmental consultant - 2 wheeled, Environmental consultant - 4 wheeled, Manufacturing engineer  OCCUPATION: unemployed  LEISURE: pt does not exercise  HAND DOMINANCE : right   PRIOR LEVEL OF FUNCTION: Independent with community mobility with device  PATIENT GOALS to find out the best way to do the exercises and the massage   OBJECTIVE  COGNITION:  Overall cognitive status: Within functional limits for tasks assessed   PALPATION: Fibrosis in inferior L breast, increased scar tissue along mastectomy scar  OBSERVATIONS / OTHER ASSESSMENTS: increased pore size in L breast  POSTURE: forward head, rounded shoulders  UPPER EXTREMITY AROM/PROM:  A/PROM RIGHT   eval   Shoulder extension 75  Shoulder flexion 153  Shoulder abduction 151  Shoulder internal rotation 43  Shoulder external rotation 71    (Blank rows = not tested)  A/PROM LEFT   eval  Shoulder extension 74  Shoulder flexion 165  Shoulder abduction 176  Shoulder internal rotation 59  Shoulder external rotation 75    (Blank rows = not  tested)    LYMPHEDEMA ASSESSMENTS:   SURGERY TYPE/DATE: L lumpectomy 08/23/21 NUMBER OF LYMPH NODES REMOVED: 1/5  CHEMOTHERAPY: did not require RADIATION: completed in 2023 HORMONE TREATMENT: did not take INFECTIONS: possible infection of seroma in L breast at the end of March 2023 that required antibiotics   LYMPHEDEMA ASSESSMENTS:   LANDMARK RIGHT  eval  10 cm proximal to olecranon process 30.9  Olecranon process 26.5  10 cm proximal to ulnar styloid process 22.5  Just proximal to ulnar styloid process 17.2  Across hand at thumb web space 19.7  At base of 2nd digit 6.5  (Blank rows = not tested)  LANDMARK LEFT  eval  10 cm proximal to olecranon process 31.2  Olecranon process 27.5  10 cm proximal to ulnar styloid process 23.2  Just proximal to ulnar styloid process 17.5  Across hand at thumb web space 20.5  At base of 2nd digit 6.4  (Blank rows = not tested)    Breast Scale: 61   TODAY'S TREATMENT  02/17/22: Manual Therapy therapist performed superficial and deep abdominals, right axillary LN, Rt intact upper quadrant sequence, then left inguinal LN's, anterior interaxillary pathway, left axillo-inguinal pathway, left breast directing toward appropriate pathways, Rt S/L for further work to lateral breast and then finished retracing steps in supine and ending with LN's.  Also performed scar mobilization to areola incision during MLD  02/13/22: Manual Therapy Pt performed MLD to supraclavicular region, then therapist performed superficial and deep abdominals, right axillary LN, Rt intact upper quadrant sequence, then left inguinal LN's, anterior interaxillary pathway, left axillo-inguinal pathway, left breast directing toward appropriate pathways, Rt S/L for further work to lateral breast and then finished retracing steps in supine and ending with LN's.  Also performed scar mobilization to areola incision during MLD MFR to Lt axilla with LT UE in end ROM of flexion and  abduction with scapular depression throughout  02/11/2022 Significant enlarged pores noted.in left breast. Fibrosis at inferior medial breast, and scar tissue under areola incision Pt performed MLD to supraclavicular region, 5 diaphragmatic breaths, right and left axillary LN, left inguinal LN's, anterior interaxillary pathway, left axillo-inguinal pathway, left breast directing toward appropriate pathways and ending with LN's. VC's and TC's given by PT for pressure and technique.  PT also performed  scar mobilization to areola incision and some MLD to the left breast and pathways and ended with LN's  PATIENT EDUCATION:  Education details: wear chip pack in compression bra, compression pump Person educated: Patient Education method: Explanation Education comprehension: verbalized understanding   HOME EXERCISE PROGRAM: Wear chip pack in compression bra  ASSESSMENT:  CLINICAL IMPRESSIONS; Pt continues with congestion and fibrosis in the left breast.  Pt will discuss POC with original PT next visit.    IMPAIRMENTS decreased knowledge of condition, increased edema, increased fascial restrictions, and postural dysfunction.   ACTIVITY LIMITATIONS reach over head  PARTICIPATION LIMITATIONS:  none  PERSONAL FACTORS Fitness, Time since onset of injury/illness/exacerbation, and 1-2 comorbidities: bacterial meningitis, L breast infection  are also affecting patient's functional outcome.   REHAB POTENTIAL: Good  CLINICAL DECISION MAKING: Stable/uncomplicated  EVALUATION COMPLEXITY: Low  GOALS: Goals reviewed with patient? Yes  SHORT TERM GOALS=LONG TERM GOALS  Target date: 02/17/2022    Pt will be independent in self MLD for long term management of lymphedema.  Baseline: Goal status: INITIAL  2.  Pt will demonstrate a 50% reduction in L breast lymphedema as evidenced by decreased pore size.  Baseline:  Goal status: INITIAL  3.  Pt will demonstrate a 75% improvement in fibrosis in  inferior and medial breast to decrease  risk of infection.  Baseline:  Goal status: MET 02/11/2022  4.  Pt will report she is no longer having L axillary tightness with L shoulder end range of motion to improve comfort.  Baseline:  Goal status:MET 02/11/2022 PLAN: PT FREQUENCY: 2x/week  PT DURATION: 4 weeks  PLANNED INTERVENTIONS: Therapeutic exercises, Patient/Family education, Self Care, Orthotic/Fit training, Manual lymph drainage, Compression bandaging, scar mobilization, Taping, Vasopneumatic device, and Manual therapy  PLAN FOR NEXT SESSION: Continue Lt  breast MLD and instruct pt, see if chip pack helps, scar mobilization, MFR L axilla DEMOGRAPHICS sent to [facility] 8/22 RW. Check goals  Stark Bray, PT 02/17/2022, 10:45 AM

## 2022-02-18 ENCOUNTER — Other Ambulatory Visit (HOSPITAL_BASED_OUTPATIENT_CLINIC_OR_DEPARTMENT_OTHER): Payer: Self-pay

## 2022-02-18 MED ORDER — LANTUS SOLOSTAR 100 UNIT/ML ~~LOC~~ SOPN
10.0000 [IU] | PEN_INJECTOR | Freq: Every day | SUBCUTANEOUS | 3 refills | Status: AC
  Filled 2022-02-18: qty 9, 90d supply, fill #0
  Filled 2022-05-16: qty 9, 90d supply, fill #1
  Filled 2022-08-14: qty 9, 90d supply, fill #2

## 2022-02-18 MED ORDER — BISACODYL 5 MG PO TBEC
DELAYED_RELEASE_TABLET | ORAL | 0 refills | Status: DC
  Filled 2022-02-18: qty 25, 20d supply, fill #0

## 2022-02-18 MED ORDER — PEG 3350-KCL-NA BICARB-NACL 420 G PO SOLR
ORAL | 0 refills | Status: DC
  Filled 2022-02-18: qty 4000, 1d supply, fill #0

## 2022-02-20 ENCOUNTER — Ambulatory Visit: Payer: Commercial Managed Care - HMO

## 2022-02-20 DIAGNOSIS — I890 Lymphedema, not elsewhere classified: Secondary | ICD-10-CM

## 2022-02-20 DIAGNOSIS — R293 Abnormal posture: Secondary | ICD-10-CM

## 2022-02-20 DIAGNOSIS — L599 Disorder of the skin and subcutaneous tissue related to radiation, unspecified: Secondary | ICD-10-CM

## 2022-02-20 NOTE — Therapy (Signed)
OUTPATIENT PHYSICAL THERAPY ONCOLOGY TREATMENT  Patient Name: Lisa [name] MRN: [id number] DOB:11/28/1960, 61 y.o., female Today's Date: 02/20/2022   PT End of Session - 02/20/22 0959     Visit Number 8    Number of Visits 12    Date for PT Re-Evaluation 03/20/22    PT Start Time 1001    PT Stop Time 1046    PT Time Calculation (min) 45 min    Activity Tolerance Patient tolerated treatment well    Behavior During Therapy [facility] for tasks assessed/performed              Past Medical History:  Diagnosis Date   Anxiety    Arthritis    Breast cancer ([place]) 06/28/21   lump found   Breast cancer ([place])    left breast [facility]   Breast hematoma 12/02/2021   Breast mass, left 07/10/2021   Bulging disc L-5   Chicken pox    Depression    Diabetes mellitus    Hyperlipidemia    Hypertension    Insomnia    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    Meningitis 06/11/2021   bacterial meningitis   Polycystic ovarian disease    Past Surgical History:  Procedure Laterality Date   BREAST LUMPECTOMY Left 08/2021   BREAST LUMPECTOMY WITH SENTINEL LYMPH NODE BIOPSY Left 08/23/2021   Procedure: LEFT BREAST LUMPECTOMY WITH SENTINEL LYMPH NODE BIOPSY;  Surgeon: Stark Klein, MD;  Location: [place];  Service: General;  Laterality: Left;   csf leak in skull  06/10/2003   noted failed attempt to close CSF leak in skull   DILATION AND EVACUATION  06/09/1988   miscarriage   IR FLUORO GUIDED NEEDLE PLC ASPIRATION/INJECTION LOC  06/12/2021   RADIOLOGY WITH ANESTHESIA N/A 06/12/2021   Procedure: IR WITH ANESTHESIA;  Surgeon: Radiologist, Medication, MD;  Location: [place];  Service: Radiology;  Laterality: N/A;   TONSILLECTOMY AND ADENOIDECTOMY  06/10/1971   Patient Active Problem List   Diagnosis Date Noted   Hyperglycemia due to type 2 diabetes mellitus ([place]) 01/21/2022   Degeneration of lumbar intervertebral disc 01/21/2022   Breast hematoma  12/02/2021   Genetic testing 08/22/2021   Family history of breast cancer 08/14/2021   Malignant neoplasm of upper-outer quadrant of left breast in female, estrogen receptor positive ([place]) 08/12/2021   Breast mass, left 07/10/2021   Fever    Bacterial meningitis    Right sided weakness    Pressure injury of skin 06/13/2021   Sepsis due to Haemophilus influenzae with acute hypoxic respiratory failure and septic shock ([place])    Acute bacterial meningitis 08/65/7846   Acute metabolic encephalopathy 96/29/5284   Altered mental status 06/12/2021   Obesity, morbid, BMI 40.0-49.9 ([place]) 03/24/2016   Family history of breast cancer in sister 10/21/2013   Tick bite of back 10/21/2012   Screening for malignant neoplasm of the cervix 05/19/2012   Routine gynecological examination 05/19/2012   Tobacco use 12/30/2011   Diabetes mellitus, type 2 ([address]) 12/29/2011   HTN (hypertension) 12/29/2011   Hyperlipidemia 12/29/2011   CSF leak 12/29/2011   Psoriasis 12/29/2011   PCOS (polycystic ovarian syndrome) 12/29/2011   Depression with anxiety 12/29/2011    PCP: Collene Leyden, MD  REFERRING PROVIDER: Stark Klein, MD   REFERRING DIAG: [phone] (ICD-10-CM) - Other specified postprocedural states   THERAPY DIAG:  Lymphedema, not elsewhere classified  Disorder of the skin and subcutaneous tissue related to radiation,  unspecified  Abnormal posture  ONSET DATE: 08/23/21  Rationale for Evaluation and Treatment Rehabilitation  SUBJECTIVE                                                                                                                                                                                           SUBJECTIVE STATEMENT: I haven't heard anything from [place] yet. I get intermittent pain in my chest at times but not sure what it is. I do my MLD and exercises at home, but its easier when my husband isn't there. I didn't wear my compression bra yesterday and the pores are more  enlarged.  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 She had negative margins and 1/5 positive nodes for a final path of pT1cN1a. She received adjuvant radiation. She was going to start anastrozole, but didn't. She was seen on 09/06/2021 with pain and swelling in the axilla, thought to have a possible seroma in the axilla did not attempt aspiration due to history of bacterial meningitis and possible risk of infection. DM, depression, HTN, obesity, arthritis   PAIN:  Are you having pain? No and no soreness/discomfort with raising my left arm  PRECAUTIONS: Other: L breast lymphedema, temporal bone malformation,  previous R rotator cuff tear  WEIGHT BEARING RESTRICTIONS No  FALLS:  Has patient fallen in last 6 months? No  LIVING ENVIRONMENT: Lives with: husband, daughter, son in law and 3 grandchildren  Lives in: House/apartment Stairs: Yes; pt reports she lives on the first floor, only has to navigate one step from driveway to porch Has following equipment at home: Single point cane, Environmental consultant - 2 wheeled, Environmental consultant - 4 wheeled, Manufacturing engineer  OCCUPATION: unemployed  LEISURE: pt does not exercise  HAND DOMINANCE : right   PRIOR LEVEL OF FUNCTION: Independent with community mobility with device  PATIENT GOALS to find out the best way to do the exercises and the massage   OBJECTIVE  COGNITION:  Overall cognitive status: Within functional limits for tasks assessed   PALPATION: Fibrosis in inferior L breast, increased scar tissue along mastectomy scar  OBSERVATIONS / OTHER ASSESSMENTS: increased pore size in L breast  POSTURE: forward head, rounded shoulders  UPPER EXTREMITY AROM/PROM:  A/PROM RIGHT   eval   Shoulder extension 75  Shoulder flexion 153  Shoulder abduction 151  Shoulder internal rotation  43  Shoulder external rotation 71    (Blank rows = not tested)  A/PROM LEFT   eval  Shoulder extension 74  Shoulder flexion 165  Shoulder abduction 176  Shoulder internal rotation 59  Shoulder external rotation 75    (Blank rows = not tested)    LYMPHEDEMA ASSESSMENTS:   SURGERY TYPE/DATE: L lumpectomy 08/23/21 NUMBER OF LYMPH NODES REMOVED: 1/5 CHEMOTHERAPY: did not require RADIATION: completed in 2023 HORMONE TREATMENT: did not take INFECTIONS: possible infection of seroma in L breast at the end of March 2023 that required antibiotics   LYMPHEDEMA ASSESSMENTS:   LANDMARK RIGHT  eval  10 cm proximal to olecranon process 30.9  Olecranon process 26.5  10 cm proximal to ulnar styloid process 22.5  Just proximal to ulnar styloid process 17.2  Across hand at thumb web space 19.7  At base of 2nd digit 6.5  (Blank rows = not tested)  LANDMARK LEFT  eval  10 cm proximal to olecranon process 31.2  Olecranon process 27.5  10 cm proximal to ulnar styloid process 23.2  Just proximal to ulnar styloid process 17.5  Across hand at thumb web space 20.5  At base of 2nd digit 6.4  (Blank rows = not tested)    Breast Scale: 61   TODAY'S TREATMENT  02/20/2022 Checked goals for recert. therapist performed  short neck,superficial and deep abdominals, right axillary LN, Rt intact upper quadrant sequence, then left inguinal LN's, anterior interaxillary pathway, left axillo-inguinal pathway, left breast directing toward appropriate pathways,  and then finished retracing steps in supine and ending with LN's.  Also performed scar mobilization to areola incision during MLD  02/17/22:  Manual Therapy therapist performed superficial and deep abdominals, right axillary LN, Rt intact upper quadrant sequence, then left inguinal LN's, anterior interaxillary pathway, left axillo-inguinal pathway, left breast directing toward appropriate pathways, Rt S/L for further work to lateral breast and  then finished retracing steps in supine and ending with LN's.  Also performed scar mobilization to areola incision during MLD  02/13/22: Manual Therapy Pt performed MLD to supraclavicular region, then therapist performed superficial and deep abdominals, right axillary LN, Rt intact upper quadrant sequence, then left inguinal LN's, anterior interaxillary pathway, left axillo-inguinal pathway, left breast directing toward appropriate pathways, Rt S/L for further work to lateral breast and then finished retracing steps in supine and ending with LN's.  Also performed scar mobilization to areola incision during MLD MFR to Lt axilla with LT UE in end ROM of flexion and abduction with scapular depression throughout  02/11/2022 Significant enlarged pores noted.in left breast. Fibrosis at inferior medial breast, and scar tissue under areola incision Pt performed MLD to supraclavicular region, 5 diaphragmatic breaths, right and left axillary LN, left inguinal LN's, anterior interaxillary pathway, left axillo-inguinal pathway, left breast directing toward appropriate pathways and ending with LN's. VC's and TC's given by PT for pressure and technique.  PT also performed  scar mobilization to areola incision and some MLD to the left breast and pathways and ended with LN's  PATIENT EDUCATION:  Education details: wear chip pack in compression bra, compression pump Person educated: Patient Education method: Explanation Education comprehension: verbalized understanding   HOME EXERCISE PROGRAM: Wear chip pack in compression bra  ASSESSMENT:  CLINICAL IMPRESSIONS; Pt continues with congestion and fibrosis in the left breast. Pores are very enlarged and there is mild redness today from increased swelling. Pt has been mostly compliant with compression bra but did not have on yesterday. She has achieved 2/4 goals but would benefit from further left breast MLD and review of proper technique.    IMPAIRMENTS  decreased knowledge of condition,  increased edema, increased fascial restrictions, and postural dysfunction.   ACTIVITY LIMITATIONS reach over head  PARTICIPATION LIMITATIONS:  none  PERSONAL FACTORS Fitness, Time since onset of injury/illness/exacerbation, and 1-2 comorbidities: bacterial meningitis, L breast infection  are also affecting patient's functional outcome.   REHAB POTENTIAL: Good  CLINICAL DECISION MAKING: Stable/uncomplicated  EVALUATION COMPLEXITY: Low  GOALS: Goals reviewed with patient? Yes  SHORT TERM GOALS=LONG TERM GOALS  Target date: 02/17/2022    Pt will be independent in self MLD for long term management of lymphedema.  Baseline: Goal status: MET 02/20/2022 2.  Pt will demonstrate a 50% reduction in L breast lymphedema as evidenced by decreased pore size.  Baseline:  Goal status: Ongoing  3.  Pt will demonstrate a 75% improvement in fibrosis in inferior and medial breast to decrease risk of infection.  Baseline:  Goal status: Ongoing  4.  Pt will report she is no longer having L axillary tightness with L shoulder end range of motion to improve comfort.  Baseline:  Goal status:MET 02/11/2022 PLAN: PT FREQUENCY: 1x/week  PT DURATION: 4 weeks  PLANNED INTERVENTIONS: Therapeutic exercises, Patient/Family education, Self Care, Orthotic/Fit training, Manual lymph drainage, Compression bandaging, scar mobilization, Taping, Vasopneumatic device, and Manual therapy  PLAN FOR NEXT SESSION:  SOZO next,Continue Lt  breast MLD and instruct pt, see if chip pack helps, scar mobilization, MFR L axilla DEMOGRAPHICS sent to [facility] 8/22 RW.   Claris Pong, PT 02/20/2022, 10:47 AM

## 2022-02-26 ENCOUNTER — Other Ambulatory Visit (HOSPITAL_BASED_OUTPATIENT_CLINIC_OR_DEPARTMENT_OTHER): Payer: Self-pay

## 2022-02-26 ENCOUNTER — Ambulatory Visit: Payer: Commercial Managed Care - HMO

## 2022-02-26 MED ORDER — ESZOPICLONE 3 MG PO TABS
3.0000 mg | ORAL_TABLET | Freq: Every day | ORAL | 0 refills | Status: DC
  Filled 2022-02-26: qty 30, 30d supply, fill #0

## 2022-02-26 MED ORDER — ALPRAZOLAM 0.25 MG PO TABS
ORAL_TABLET | ORAL | 0 refills | Status: DC
  Filled 2022-02-26: qty 20, 20d supply, fill #0

## 2022-03-06 ENCOUNTER — Ambulatory Visit: Payer: Commercial Managed Care - HMO

## 2022-03-06 DIAGNOSIS — R293 Abnormal posture: Secondary | ICD-10-CM

## 2022-03-06 DIAGNOSIS — L599 Disorder of the skin and subcutaneous tissue related to radiation, unspecified: Secondary | ICD-10-CM

## 2022-03-06 DIAGNOSIS — I890 Lymphedema, not elsewhere classified: Secondary | ICD-10-CM | POA: Diagnosis not present

## 2022-03-06 DIAGNOSIS — Z170 Estrogen receptor positive status [ER+]: Secondary | ICD-10-CM

## 2022-03-06 NOTE — Therapy (Signed)
OUTPATIENT PHYSICAL THERAPY ONCOLOGY TREATMENT  Patient Name: Lisa [name] MRN: [id number] DOB:1961/01/20, 61 y.o., female Today's Date: 03/06/2022   PT End of Session - 03/06/22 0958     Visit Number 9    Number of Visits 12    Date for PT Re-Evaluation 03/20/22    PT Start Time 1000    PT Stop Time 1055    PT Time Calculation (min) 55 min    Activity Tolerance Patient tolerated treatment well    Behavior During Therapy [facility] for tasks assessed/performed              Past Medical History:  Diagnosis Date   Anxiety    Arthritis    Breast cancer ([place]) 06/28/21   lump found   Breast cancer ([place])    left breast [facility]   Breast hematoma 12/02/2021   Breast mass, left 07/10/2021   Bulging disc L-5   Chicken pox    Depression    Diabetes mellitus    Hyperlipidemia    Hypertension    Insomnia    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    Meningitis 06/11/2021   bacterial meningitis   Polycystic ovarian disease    Past Surgical History:  Procedure Laterality Date   BREAST LUMPECTOMY Left 08/2021   BREAST LUMPECTOMY WITH SENTINEL LYMPH NODE BIOPSY Left 08/23/2021   Procedure: LEFT BREAST LUMPECTOMY WITH SENTINEL LYMPH NODE BIOPSY;  Surgeon: Stark Klein, MD;  Location: [place];  Service: General;  Laterality: Left;   csf leak in skull  06/10/2003   noted failed attempt to close CSF leak in skull   DILATION AND EVACUATION  06/09/1988   miscarriage   IR FLUORO GUIDED NEEDLE PLC ASPIRATION/INJECTION LOC  06/12/2021   RADIOLOGY WITH ANESTHESIA N/A 06/12/2021   Procedure: IR WITH ANESTHESIA;  Surgeon: Radiologist, Medication, MD;  Location: [place];  Service: Radiology;  Laterality: N/A;   TONSILLECTOMY AND ADENOIDECTOMY  06/10/1971   Patient Active Problem List   Diagnosis Date Noted   Hyperglycemia due to type 2 diabetes mellitus ([place]) 01/21/2022   Degeneration of lumbar intervertebral disc 01/21/2022   Breast hematoma  12/02/2021   Genetic testing 08/22/2021   Family history of breast cancer 08/14/2021   Malignant neoplasm of upper-outer quadrant of left breast in female, estrogen receptor positive ([place]) 08/12/2021   Breast mass, left 07/10/2021   Fever    Bacterial meningitis    Right sided weakness    Pressure injury of skin 06/13/2021   Sepsis due to Haemophilus influenzae with acute hypoxic respiratory failure and septic shock ([place])    Acute bacterial meningitis 31/54/0086   Acute metabolic encephalopathy 76/19/5093   Altered mental status 06/12/2021   Obesity, morbid, BMI 40.0-49.9 ([place]) 03/24/2016   Family history of breast cancer in sister 10/21/2013   Tick bite of back 10/21/2012   Screening for malignant neoplasm of the cervix 05/19/2012   Routine gynecological examination 05/19/2012   Tobacco use 12/30/2011   Diabetes mellitus, type 2 ([place]) 12/29/2011   HTN (hypertension) 12/29/2011   Hyperlipidemia 12/29/2011   CSF leak 12/29/2011   Psoriasis 12/29/2011   PCOS (polycystic ovarian syndrome) 12/29/2011   Depression with anxiety 12/29/2011    PCP: Collene Leyden, MD  REFERRING PROVIDER: Stark Klein, MD   REFERRING DIAG: [phone] (ICD-10-CM) - Other specified postprocedural states   THERAPY DIAG:  Lymphedema, not elsewhere classified  Disorder of the skin and subcutaneous tissue related to radiation,  unspecified  Abnormal posture  Malignant neoplasm of upper-outer quadrant of left breast in female, estrogen receptor positive ([place])  ONSET DATE: 08/23/21  Rationale for Evaluation and Treatment Rehabilitation  SUBJECTIVE                                                                                                                                                                                           SUBJECTIVE STATEMENT: I got my pump last week. I have used it 3 times and it seems to work really well.  This morning the pores look a lot better, but I still have some firmness at  the nipple. I see the surgeon again next week.  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 She had negative margins and 1/5 positive nodes for a final path of pT1cN1a. She received adjuvant radiation. She was going to start anastrozole, but didn't. She was seen on 09/06/2021 with pain and swelling in the axilla, thought to have a possible seroma in the axilla did not attempt aspiration due to history of bacterial meningitis and possible risk of infection. DM, depression, HTN, obesity, arthritis   PAIN:  Are you having pain? No and no soreness/discomfort with raising my left arm  PRECAUTIONS: Other: L breast lymphedema, temporal bone malformation,  previous R rotator cuff tear  WEIGHT BEARING RESTRICTIONS No  FALLS:  Has patient fallen in last 6 months? No  LIVING ENVIRONMENT: Lives with: husband, daughter, son in law and 3 grandchildren  Lives in: House/apartment Stairs: Yes; pt reports she lives on the first floor, only has to navigate one step from driveway to porch Has following equipment at home: Single point cane, Environmental consultant - 2 wheeled, Environmental consultant - 4 wheeled, Manufacturing engineer  OCCUPATION: unemployed  LEISURE: pt does not exercise  HAND DOMINANCE : right   PRIOR LEVEL OF FUNCTION: Independent with community mobility with device  PATIENT GOALS to find out the best way to do the exercises and the massage   OBJECTIVE  COGNITION:  Overall cognitive status: Within functional limits for tasks assessed   PALPATION: Fibrosis in inferior L breast, increased scar tissue along mastectomy scar  OBSERVATIONS / OTHER ASSESSMENTS: increased pore size in L breast  POSTURE: forward head, rounded shoulders  UPPER EXTREMITY AROM/PROM:  A/PROM RIGHT   eval   Shoulder extension 75  Shoulder flexion 153  Shoulder  abduction 151  Shoulder internal rotation 43  Shoulder external rotation 71    (Blank rows = not tested)  A/PROM  LEFT   eval  Shoulder extension 74  Shoulder flexion 165  Shoulder abduction 176  Shoulder internal rotation 59  Shoulder external rotation 75    (Blank rows = not tested)    LYMPHEDEMA ASSESSMENTS:   SURGERY TYPE/DATE: L lumpectomy 08/23/21 NUMBER OF LYMPH NODES REMOVED: 1/5 CHEMOTHERAPY: did not require RADIATION: completed in 2023 HORMONE TREATMENT: did not take INFECTIONS: possible infection of seroma in L breast at the end of March 2023 that required antibiotics   LYMPHEDEMA ASSESSMENTS:   LANDMARK RIGHT  eval  10 cm proximal to olecranon process 30.9  Olecranon process 26.5  10 cm proximal to ulnar styloid process 22.5  Just proximal to ulnar styloid process 17.2  Across hand at thumb web space 19.7  At base of 2nd digit 6.5  (Blank rows = not tested)  LANDMARK LEFT  eval  10 cm proximal to olecranon process 31.2  Olecranon process 27.5  10 cm proximal to ulnar styloid process 23.2  Just proximal to ulnar styloid process 17.5  Across hand at thumb web space 20.5  At base of 2nd digit 6.4  (Blank rows = not tested)    Breast Scale: 61   TODAY'S TREATMENT   03/06/2022 SOZO screen performed. Pt still in the green. Follow up screen set up therapist performed  short neck,superficial and deep abdominals, right axillary LN,  then left inguinal LN's, anterior interaxillary pathway, left axillo-inguinal pathway, left breast directing toward appropriate pathways,  and then finished retracing steps in supine and ending with LN's. Pt then demonstrated all steps with only occasional VC's Also performed scar mobilization to areola incision during MLD Assessed goals for DC   02/20/2022 Checked goals for recert. therapist performed  short neck,superficial and deep abdominals, right axillary LN, Rt intact upper quadrant sequence, then left inguinal LN's,  anterior interaxillary pathway, left axillo-inguinal pathway, left breast directing toward appropriate pathways,  and then finished retracing steps in supine and ending with LN's.  Also performed scar mobilization to areola incision during MLD  02/17/22:  Manual Therapy therapist performed superficial and deep abdominals, right axillary LN, Rt intact upper quadrant sequence, then left inguinal LN's, anterior interaxillary pathway, left axillo-inguinal pathway, left breast directing toward appropriate pathways, Rt S/L for further work to lateral breast and then finished retracing steps in supine and ending with LN's.  Also performed scar mobilization to areola incision during MLD  02/13/22: Manual Therapy Pt performed MLD to supraclavicular region, then therapist performed superficial and deep abdominals, right axillary LN, Rt intact upper quadrant sequence, then left inguinal LN's, anterior interaxillary pathway, left axillo-inguinal pathway, left breast directing toward appropriate pathways, Rt S/L for further work to lateral breast and then finished retracing steps in supine and ending with LN's.  Also performed scar mobilization to areola incision during MLD MFR to Lt axilla with LT UE in end ROM of flexion and abduction with scapular depression throughout  02/11/2022 Significant enlarged pores noted.in left breast. Fibrosis at inferior medial breast, and scar tissue under areola incision Pt performed MLD to supraclavicular region, 5 diaphragmatic breaths, right and left axillary LN, left inguinal LN's, anterior interaxillary pathway, left axillo-inguinal pathway, left breast directing toward appropriate pathways and ending with LN's. VC's and TC's given by PT for pressure and technique.  PT also performed  scar mobilization to areola incision and some MLD to the left breast and pathways and ended with LN's  PATIENT EDUCATION:  Education details: wear chip pack in compression bra, compression  pump Person  educated: Patient Education method: Explanation Education comprehension: verbalized understanding   HOME EXERCISE PROGRAM: Wear chip pack in compression bra  ASSESSMENT:  CLINICAL IMPRESSIONS;   SOZO screen In the green. Therapist performed left breast MLD and then had pt perform. Pt was able to return demonstrate proper technique with MLD. Pores did appear to be smaller after MLD and Pt is pleased with her Flexitouch. She has fully achieved 3/4 goals and partially achieved one goal. She feels like she is ready for DC to independent self Management.  IMPAIRMENTS decreased knowledge of condition, increased edema, increased fascial restrictions, and postural dysfunction.   ACTIVITY LIMITATIONS reach over head  PARTICIPATION LIMITATIONS:  none  PERSONAL FACTORS Fitness, Time since onset of injury/illness/exacerbation, and 1-2 comorbidities: bacterial meningitis, L breast infection  are also affecting patient's functional outcome.   REHAB POTENTIAL: Good  CLINICAL DECISION MAKING: Stable/uncomplicated  EVALUATION COMPLEXITY: Low  GOALS: Goals reviewed with patient? Yes  SHORT TERM GOALS=LONG TERM GOALS  Target date: 02/17/2022    Pt will be independent in self MLD for long term management of lymphedema.  Baseline: Goal status: MET 02/20/2022 2.  Pt will demonstrate a 50% reduction in L breast lymphedema as evidenced by decreased pore size.  Baseline:  Goal status: Partially met, pores still enlarged some 03/06/2022  3.  Pt will demonstrate a 75% improvement in fibrosis in inferior and medial breast to decrease risk of infection.  Baseline:  Goal status: MET 03/06/2022  4.  Pt will report she is no longer having L axillary tightness with L shoulder end range of motion to improve comfort.  Baseline:  Goal status:MET 02/11/2022 PLAN: PT FREQUENCY: 1x/week  PT DURATION: 4 weeks  PLANNED INTERVENTIONS: Therapeutic exercises, Patient/Family education, Self Care,  Orthotic/Fit training, Manual lymph drainage, Compression bandaging, scar mobilization, Taping, Vasopneumatic device, and Manual therapy  PLAN FOR NEXT SESSION:  SOZO next,Continue Lt  breast MLD and instruct pt, see if chip pack helps, scar mobilization, MFR L axilla DEMOGRAPHICS sent to [facility] 8/22 RW.   PHYSICAL THERAPY DISCHARGE SUMMARY  Visits from Start of Care: 9  Current functional level related to goals / functional outcomes: Achieved 3/4 goals fully. Pores still slightly enlarged   Remaining deficits: Some enlarged pores remain and occasional mild fibrosis but pt is independent and has Armed forces operational officer / Equipment: Compression bra, Flexitouch   Patient agrees to discharge. Patient goals were partially met. Patient is being discharged due to meeting the stated rehab goals.  Claris Pong, PT 03/06/2022, 10:55 AM

## 2022-03-10 ENCOUNTER — Other Ambulatory Visit (HOSPITAL_BASED_OUTPATIENT_CLINIC_OR_DEPARTMENT_OTHER): Payer: Self-pay

## 2022-03-12 ENCOUNTER — Ambulatory Visit: Payer: Commercial Managed Care - HMO

## 2022-03-17 ENCOUNTER — Encounter: Payer: Self-pay | Admitting: Physical Therapy

## 2022-03-17 ENCOUNTER — Other Ambulatory Visit (HOSPITAL_BASED_OUTPATIENT_CLINIC_OR_DEPARTMENT_OTHER): Payer: Self-pay

## 2022-03-17 ENCOUNTER — Ambulatory Visit: Payer: Commercial Managed Care - HMO | Attending: Sports Medicine | Admitting: Physical Therapy

## 2022-03-17 DIAGNOSIS — M25511 Pain in right shoulder: Secondary | ICD-10-CM | POA: Diagnosis present

## 2022-03-17 DIAGNOSIS — R2681 Unsteadiness on feet: Secondary | ICD-10-CM | POA: Diagnosis present

## 2022-03-17 DIAGNOSIS — M6281 Muscle weakness (generalized): Secondary | ICD-10-CM | POA: Insufficient documentation

## 2022-03-17 DIAGNOSIS — G8929 Other chronic pain: Secondary | ICD-10-CM | POA: Diagnosis present

## 2022-03-17 DIAGNOSIS — R293 Abnormal posture: Secondary | ICD-10-CM | POA: Insufficient documentation

## 2022-03-17 DIAGNOSIS — M25561 Pain in right knee: Secondary | ICD-10-CM | POA: Insufficient documentation

## 2022-03-17 DIAGNOSIS — R262 Difficulty in walking, not elsewhere classified: Secondary | ICD-10-CM | POA: Diagnosis present

## 2022-03-17 MED ORDER — CYCLOBENZAPRINE HCL 5 MG PO TABS
5.0000 mg | ORAL_TABLET | Freq: Every evening | ORAL | 0 refills | Status: DC | PRN
  Filled 2022-03-17: qty 14, 14d supply, fill #0

## 2022-03-17 NOTE — Therapy (Signed)
OUTPATIENT PHYSICAL THERAPY SHOULDER EVALUATION   Patient Name: Lisa [name] MRN: [id number] DOB:[birth year], 61 y.o., female Today's Date: 03/17/2022   PT End of Session - 03/17/22 1041     Visit Number 1    Date for PT Re-Evaluation 06/09/22    PT Start Time 0930    PT Stop Time 1012    PT Time Calculation (min) 42 min    Activity Tolerance Patient tolerated treatment well    Behavior During Therapy [facility] for tasks assessed/performed             Past Medical History:  Diagnosis Date   Anxiety    Arthritis    Breast cancer ([place]) 06/28/21   lump found   Breast cancer ([place])    left breast [facility]   Breast hematoma 12/02/2021   Breast mass, left 07/10/2021   Bulging disc L-5   Chicken pox    Depression    Diabetes mellitus    Hyperlipidemia    Hypertension    Insomnia    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    Meningitis 06/11/2021   bacterial meningitis   Polycystic ovarian disease    Past Surgical History:  Procedure Laterality Date   BREAST LUMPECTOMY Left 08/2021   BREAST LUMPECTOMY WITH SENTINEL LYMPH NODE BIOPSY Left 08/23/2021   Procedure: LEFT BREAST LUMPECTOMY WITH SENTINEL LYMPH NODE BIOPSY;  Surgeon: Stark Klein, MD;  Location: [place];  Service: General;  Laterality: Left;   csf leak in skull  06/10/2003   noted failed attempt to close CSF leak in skull   DILATION AND EVACUATION  06/09/1988   miscarriage   IR FLUORO GUIDED NEEDLE PLC ASPIRATION/INJECTION LOC  06/12/2021   RADIOLOGY WITH ANESTHESIA N/A 06/12/2021   Procedure: IR WITH ANESTHESIA;  Surgeon: Radiologist, Medication, MD;  Location: [place];  Service: Radiology;  Laterality: N/A;   TONSILLECTOMY AND ADENOIDECTOMY  06/10/1971   Patient Active Problem List   Diagnosis Date Noted   Hyperglycemia due to type 2 diabetes mellitus ([place]) 01/21/2022   Degeneration of lumbar intervertebral disc 01/21/2022   Breast hematoma 12/02/2021   Genetic  testing 08/22/2021   Family history of breast cancer 08/14/2021   Malignant neoplasm of upper-outer quadrant of left breast in female, estrogen receptor positive ([place]) 08/12/2021   Breast mass, left 07/10/2021   Fever    Bacterial meningitis    Right sided weakness    Pressure injury of skin 06/13/2021   Sepsis due to Haemophilus influenzae with acute hypoxic respiratory failure and septic shock ([place])    Acute bacterial meningitis 09/38/1829   Acute metabolic encephalopathy 93/71/6967   Altered mental status 06/12/2021   Obesity, morbid, BMI 40.0-49.9 ([place]) 03/24/2016   Family history of breast cancer in sister 10/21/2013   Tick bite of back 10/21/2012   Screening for malignant neoplasm of the cervix 05/19/2012   Routine gynecological examination 05/19/2012   Tobacco use 12/30/2011   Diabetes mellitus, type 2 ([place]) 12/29/2011   HTN (hypertension) 12/29/2011   Hyperlipidemia 12/29/2011   CSF leak 12/29/2011   Psoriasis 12/29/2011   PCOS (polycystic ovarian syndrome) 12/29/2011   Depression with anxiety 12/29/2011    PCP: Collene Leyden  REFERRING PROVIDER: Inez Catalina, MD   REFERRING DIAG: [phone] (ICD-10-CM) - Strain of muscle(s) and tendon(s) of the rotator cuff of right shoulder, initial encounter   THERAPY DIAG:  Abnormal posture  Muscle weakness (generalized)  Difficulty in walking, not elsewhere classified  Unsteadiness on feet  Chronic right shoulder pain  Chronic pain of right knee  Rationale for Evaluation and Treatment Rehabilitation  ONSET DATE: 02/12/2022   SUBJECTIVE:                                                                                                                                                                                      SUBJECTIVE STATEMENT: Patient had recent surgery for breast cancer, developed Lymphedema and has completed PT for that. Her R shoulder pain is much improved since her last visit. She is also experiencing R  knee pain, which did not respond to the injection she just received. Her arms and hands are stiff and sore B.  PERTINENT HISTORY: Patient with high grade partial-thickness tear to R supraspinatus muscle. Identified in Jan 2023, but she developed breast cancer, so her shoulder treatment was put on hold. She was just recently being followed for Lymphedma PT  Chronic B knee pain, R > L. Received corticosteroid injection in R knee 02/12/22. Chronic back pain has resolved.   PAIN:  Are you having pain? Yes: NPRS scale: 5/10 Pain location: R arm, R knee, Pain description: arm pain-tingling, burning in am, R knee starts with no pain, increases during the day. Aggravating factors: Walking as her knee pain increases, she uses her UE more on walker. Relieving factors: Medication, movement in R arm  PRECAUTIONS: None  WEIGHT BEARING RESTRICTIONS No  FALLS:  Has patient fallen in last 6 months? Yes. Number of falls 1 She was sweeping and felt a pinch and pain shot down her leg, causing her to fall.  LIVING ENVIRONMENT: Lives with: lives with their spouse Lives in: House/apartment Stairs: Yes: Internal: 12 steps; can reach both and External: 1 steps; none, able to live on main level Has following equipment at home: Gilford Rile - 2 wheeled, Environmental consultant - 4 wheeled, Manufacturing engineer  OCCUPATION: N/A  PLOF: Independent  PATIENT GOALS Improve mobility, decrease pain  OBJECTIVE:   DIAGNOSTIC FINDINGS:  Previous MRI showed L4-L5 disc herniation with mass effect at L5 nerve root  PATIENT SURVEYS:  FOTO 48.4  COGNITION:  Overall cognitive status: Within functional limits for tasks assessed     SENSATION: N/T  POSTURE: Round shoulders, forward head, flexed posture.  UPPER EXTREMITY ROM: All R shoulder movement is painful. LUE ROM WFL  Active ROM Right eval Left eval  Shoulder flexion 138   Shoulder extension WNL   Shoulder abduction [facility]   Shoulder adduction    Shoulder internal rotation [facility]    Shoulder external rotation [facility]   Elbow flexion    Elbow extension    Wrist flexion  Wrist extension    Wrist ulnar deviation    Wrist radial deviation    Wrist pronation    Wrist supination    (Blank rows = not tested)  UPPER EXTREMITY MMT: all R shoulder movements painful, LUE WFL  MMT Right eval Left eval  Shoulder flexion 3+   Shoulder extension 4-   Shoulder abduction 3+   Shoulder adduction    Shoulder internal rotation 3+   Shoulder external rotation 3+   Middle trapezius    Lower trapezius    Elbow flexion 4   Elbow extension 4   Wrist flexion    Wrist extension    Wrist ulnar deviation    Wrist radial deviation    Wrist pronation    Wrist supination    Grip strength (lbs)    (Blank rows = not tested)  SHOULDER SPECIAL TESTS:  Impingement tests: Hawkins/Kennedy impingement test: positive    Rotator cuff assessment: Empty can test: positive  and Belly press test: negative    JOINT MOBILITY TESTING:  B shoulder inferior and posterior glide limited, moderate.   FUNCTIONAL ASSESSMENT   5X STS-27.97  PALPATION:  TTP over supraspinatus muscle and tendon, tension noted in B up traps.   TODAY'S TREATMENT:  Education, reviewed HEP-Patient instructed to attempt to perform the standing exercises, skip all supine, and report back at next visit   PATIENT EDUCATION: Education details: POC, HEP Person educated: Patient Education method: Consulting civil engineer, Demonstration, and Handouts Education comprehension: verbalized understanding   HOME EXERCISE PROGRAM: [id number]  ASSESSMENT:  CLINICAL IMPRESSION: Patient is a 61 y.o. who was seen today for physical therapy evaluation and treatment for R shoulder pain, R knee pain. She has had quite a year, reporting Meningitis, B chronic knee pain, episode of back pain which has resolved, R shoulder rotator cuff tear, breast cancer. She had a lumpectomy on L and has completed PT for Lymphedema. She presents with continued R  knee pain in spite of recent steroid injection, R shoulder pain is improved and she demonstrates fair ROM, but it is all painful and her strength is impaired by pain. She would benefit from PT  to address her weakness, build shoulder stabilizers to allow improved ROM without pain, functional mobility training to maximize her safety and I with functional mobility while minimizing her pain and her chronic conditions. She reports concerns regarding her ability to consistently perform HEP. Therapist educated her to the importance of consistent performance, but we will try to establish a program which is easier for her to access, minimizing supine exercises.   OBJECTIVE IMPAIRMENTS Abnormal gait, decreased activity tolerance, decreased balance, decreased coordination, decreased endurance, decreased mobility, difficulty walking, decreased ROM, decreased strength, impaired UE functional use, improper body mechanics, postural dysfunction, obesity, and pain.   ACTIVITY LIMITATIONS carrying, lifting, bending, sitting, standing, squatting, sleeping, stairs, transfers, reach over head, and locomotion level  PARTICIPATION LIMITATIONS: meal prep, cleaning, laundry, community activity, and yard work  PERSONAL FACTORS Behavior pattern, Past/current experiences, and 1-2 comorbidities: breast cancer, meningitis, DM  are also affecting patient's functional outcome.   REHAB POTENTIAL: Good  CLINICAL DECISION MAKING: Stable/uncomplicated  EVALUATION COMPLEXITY: Moderate   GOALS: Goals reviewed with patient? Yes  SHORT TERM GOALS: Target date: 04/14/2022(Remove Blue Hyperlink)  I with basic HEP Baseline: Goal status: INITIAL  LONG TERM GOALS: Target date: 06/09/2022  (Remove Blue Hyperlink)  I with final HEP Baseline:  Goal status: INITIAL  2.  Patient will complete 5 x STS in < 14 sec  to demonstrate improved strength in BLE Baseline: 28 Goal status: INITIAL  3.  Patient will achieve at least 55 on FOTO  to demonstrate improved functional mobility Baseline: 48 Goal status: INITIAL  4.  Patient will perform over elevation of R shoulder with pain < 3/10 Baseline: 5/10 Goal status: INITIAL  5.  Increase R shoulder strength to at least 4/5 with pain < 3/10 Baseline: (3+)-4-/5 pain 5/10 Goal status: INITIAL  6.  Patient will ambulate at least 400' with LRAD, MI, normalized gait pattern. Baseline: Clinic distances with RW, effortful, antalgic, increased lateral sway, slow speed. Goal status: INITIAL   PLAN: PT FREQUENCY: 2x/week  PT DURATION: 12 weeks  PLANNED INTERVENTIONS: Therapeutic exercises, Therapeutic activity, Neuromuscular re-education, Balance training, Gait training, Patient/Family education, Self Care, Joint mobilization, Dry Needling, Cryotherapy, Moist heat, Vasopneumatic device, Ionotophoresis '4mg'$ /ml Dexamethasone, and Manual therapy  PLAN FOR NEXT SESSION: Update HEP as tolerated. Strenthening to trunk and LE   Marcelina Morel, DPT 03/17/2022, 10:43 AM

## 2022-03-18 ENCOUNTER — Other Ambulatory Visit (HOSPITAL_BASED_OUTPATIENT_CLINIC_OR_DEPARTMENT_OTHER): Payer: Self-pay

## 2022-03-18 MED ORDER — DICLOFENAC SODIUM 75 MG PO TBEC
75.0000 mg | DELAYED_RELEASE_TABLET | Freq: Two times a day (BID) | ORAL | 0 refills | Status: DC | PRN
  Filled 2022-03-18: qty 180, 90d supply, fill #0

## 2022-03-19 ENCOUNTER — Other Ambulatory Visit (HOSPITAL_BASED_OUTPATIENT_CLINIC_OR_DEPARTMENT_OTHER): Payer: Self-pay

## 2022-03-19 MED ORDER — DICLOFENAC SODIUM 75 MG PO TBEC
75.0000 mg | DELAYED_RELEASE_TABLET | Freq: Two times a day (BID) | ORAL | 0 refills | Status: DC
  Filled 2022-03-19 – 2022-06-03 (×2): qty 180, 90d supply, fill #0

## 2022-03-19 MED ORDER — DICLOFENAC SODIUM 1 % EX GEL
4.0000 g | Freq: Four times a day (QID) | CUTANEOUS | 0 refills | Status: DC
  Filled 2022-03-19: qty 100, 25d supply, fill #0

## 2022-03-20 ENCOUNTER — Ambulatory Visit: Payer: Commercial Managed Care - HMO

## 2022-03-20 ENCOUNTER — Encounter: Payer: Self-pay | Admitting: *Deleted

## 2022-03-20 ENCOUNTER — Other Ambulatory Visit (HOSPITAL_BASED_OUTPATIENT_CLINIC_OR_DEPARTMENT_OTHER): Payer: Self-pay

## 2022-03-20 DIAGNOSIS — R293 Abnormal posture: Secondary | ICD-10-CM | POA: Diagnosis not present

## 2022-03-20 DIAGNOSIS — M6281 Muscle weakness (generalized): Secondary | ICD-10-CM

## 2022-03-20 DIAGNOSIS — G8929 Other chronic pain: Secondary | ICD-10-CM

## 2022-03-20 DIAGNOSIS — R262 Difficulty in walking, not elsewhere classified: Secondary | ICD-10-CM

## 2022-03-20 DIAGNOSIS — R2681 Unsteadiness on feet: Secondary | ICD-10-CM

## 2022-03-20 NOTE — Therapy (Signed)
OUTPATIENT PHYSICAL THERAPY SHOULDER TREATMENT   Patient Name: ULYANA PITONES MRN: [id number] DOB:Oct 17, 1960, 61 y.o., female Today's Date: 03/20/2022   PT End of Session - 03/20/22 1317     Visit Number 2    Date for PT Re-Evaluation 06/09/22    PT Start Time 1315    PT Stop Time 1400    PT Time Calculation (min) 45 min    Activity Tolerance Patient tolerated treatment well    Behavior During Therapy [facility] for tasks assessed/performed              Past Medical History:  Diagnosis Date   Anxiety    Arthritis    Breast cancer ([place]) 06/28/21   lump found   Breast cancer ([place])    left breast [facility]   Breast hematoma 12/02/2021   Breast mass, left 07/10/2021   Bulging disc L-5   Chicken pox    Depression    Diabetes mellitus    Hyperlipidemia    Hypertension    Insomnia    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    Meningitis 06/11/2021   bacterial meningitis   Polycystic ovarian disease    Past Surgical History:  Procedure Laterality Date   BREAST LUMPECTOMY Left 08/2021   BREAST LUMPECTOMY WITH SENTINEL LYMPH NODE BIOPSY Left 08/23/2021   Procedure: LEFT BREAST LUMPECTOMY WITH SENTINEL LYMPH NODE BIOPSY;  Surgeon: Stark Klein, MD;  Location: [place];  Service: General;  Laterality: Left;   csf leak in skull  06/10/2003   noted failed attempt to close CSF leak in skull   DILATION AND EVACUATION  06/09/1988   miscarriage   IR FLUORO GUIDED NEEDLE PLC ASPIRATION/INJECTION LOC  06/12/2021   RADIOLOGY WITH ANESTHESIA N/A 06/12/2021   Procedure: IR WITH ANESTHESIA;  Surgeon: Radiologist, Medication, MD;  Location: [place];  Service: Radiology;  Laterality: N/A;   TONSILLECTOMY AND ADENOIDECTOMY  06/10/1971   Patient Active Problem List   Diagnosis Date Noted   Hyperglycemia due to type 2 diabetes mellitus ([place]) 01/21/2022   Degeneration of lumbar intervertebral disc 01/21/2022   Breast hematoma 12/02/2021   Genetic  testing 08/22/2021   Family history of breast cancer 08/14/2021   Malignant neoplasm of upper-outer quadrant of left breast in female, estrogen receptor positive ([place]) 08/12/2021   Breast mass, left 07/10/2021   Fever    Bacterial meningitis    Right sided weakness    Pressure injury of skin 06/13/2021   Sepsis due to Haemophilus influenzae with acute hypoxic respiratory failure and septic shock ([place])    Acute bacterial meningitis 65/68/1275   Acute metabolic encephalopathy 17/00/1749   Altered mental status 06/12/2021   Obesity, morbid, BMI 40.0-49.9 ([place]) 03/24/2016   Family history of breast cancer in sister 10/21/2013   Tick bite of back 10/21/2012   Screening for malignant neoplasm of the cervix 05/19/2012   Routine gynecological examination 05/19/2012   Tobacco use 12/30/2011   Diabetes mellitus, type 2 ([place]) 12/29/2011   HTN (hypertension) 12/29/2011   Hyperlipidemia 12/29/2011   CSF leak 12/29/2011   Psoriasis 12/29/2011   PCOS (polycystic ovarian syndrome) 12/29/2011   Depression with anxiety 12/29/2011    PCP: Collene Leyden  REFERRING PROVIDER: Inez Catalina, MD   REFERRING DIAG: [phone] (ICD-10-CM) - Strain of muscle(s) and tendon(s) of the rotator cuff of right shoulder, initial encounter   THERAPY DIAG:  Abnormal posture  Muscle weakness (generalized)  Difficulty in walking, not elsewhere  classified  Unsteadiness on feet  Chronic right shoulder pain  Chronic pain of right knee  Rationale for Evaluation and Treatment Rehabilitation  ONSET DATE: 02/12/2022   SUBJECTIVE:                                                                                                                                                                                      SUBJECTIVE STATEMENT: The pain in my knees are worse than my R shoulder. My pain is about a 6 today.   PERTINENT HISTORY: Patient with high grade partial-thickness tear to R supraspinatus muscle.  Identified in Jan 2023, but she developed breast cancer, so her shoulder treatment was put on hold. She was just recently being followed for Lymphedma PT  Chronic B knee pain, R > L. Received corticosteroid injection in R knee 02/12/22. Chronic back pain has resolved.   PAIN:  Are you having pain? Yes: NPRS scale: 6/10 Pain location: R arm, R knee, Pain description: arm pain-tingling, burning in am, R knee starts with no pain, increases during the day. Aggravating factors: Walking as her knee pain increases, she uses her UE more on walker. Relieving factors: Medication, movement in R arm  PRECAUTIONS: None  WEIGHT BEARING RESTRICTIONS No  FALLS:  Has patient fallen in last 6 months? Yes. Number of falls 1 She was sweeping and felt a pinch and pain shot down her leg, causing her to fall.  LIVING ENVIRONMENT: Lives with: lives with their spouse Lives in: House/apartment Stairs: Yes: Internal: 12 steps; can reach both and External: 1 steps; none, able to live on main level Has following equipment at home: Gilford Rile - 2 wheeled, Environmental consultant - 4 wheeled, Manufacturing engineer  OCCUPATION: N/A  PLOF: Independent  PATIENT GOALS Improve mobility, decrease pain  OBJECTIVE:   DIAGNOSTIC FINDINGS:  Previous MRI showed L4-L5 disc herniation with mass effect at L5 nerve root  PATIENT SURVEYS:  FOTO 48.4  COGNITION:  Overall cognitive status: Within functional limits for tasks assessed     SENSATION: N/T  POSTURE: Round shoulders, forward head, flexed posture.  UPPER EXTREMITY ROM: All R shoulder movement is painful. LUE ROM WFL  Active ROM Right eval Left eval  Shoulder flexion 138   Shoulder extension WNL   Shoulder abduction [facility]   Shoulder adduction    Shoulder internal rotation [facility]   Shoulder external rotation [facility]   Elbow flexion    Elbow extension    Wrist flexion    Wrist extension    Wrist ulnar deviation    Wrist radial deviation    Wrist pronation    Wrist supination     (Blank rows =  not tested)  UPPER EXTREMITY MMT: all R shoulder movements painful, LUE WFL  MMT Right eval Left eval  Shoulder flexion 3+   Shoulder extension 4-   Shoulder abduction 3+   Shoulder adduction    Shoulder internal rotation 3+   Shoulder external rotation 3+   Middle trapezius    Lower trapezius    Elbow flexion 4   Elbow extension 4   Wrist flexion    Wrist extension    Wrist ulnar deviation    Wrist radial deviation    Wrist pronation    Wrist supination    Grip strength (lbs)    (Blank rows = not tested)  SHOULDER SPECIAL TESTS:  Impingement tests: Hawkins/Kennedy impingement test: positive    Rotator cuff assessment: Empty can test: positive  and Belly press test: negative    JOINT MOBILITY TESTING:  B shoulder inferior and posterior glide limited, moderate.   FUNCTIONAL ASSESSMENT   5X STS-27.97  PALPATION:  TTP over supraspinatus muscle and tendon, tension noted in B up traps.   TODAY'S TREATMENT:  03/20/22 NuStep L5 x 64mns  LAQ 2# 2x10  Marching in RW 2#  HS curls greenTB 2x10 STS from elevated mat table 2x10 Shoulder flexion 3# WaTe 2x10 ER with scapular retraction 2x10     PATIENT EDUCATION: Education details: POC, HEP Person educated: Patient Education method: EConsulting civil engineer DMedia planner and Handouts Education comprehension: verbalized understanding   HOME EXERCISE PROGRAM: [id number]  ASSESSMENT:  CLINICAL IMPRESSION: Patient returns with pain in knees today, we worked on functional LE strengthening. Also worked on her R shoulder. She tolerates session well without an increase in pain.    OBJECTIVE IMPAIRMENTS Abnormal gait, decreased activity tolerance, decreased balance, decreased coordination, decreased endurance, decreased mobility, difficulty walking, decreased ROM, decreased strength, impaired UE functional use, improper body mechanics, postural dysfunction, obesity, and pain.   ACTIVITY LIMITATIONS carrying, lifting,  bending, sitting, standing, squatting, sleeping, stairs, transfers, reach over head, and locomotion level  PARTICIPATION LIMITATIONS: meal prep, cleaning, laundry, community activity, and yard work  PERSONAL FACTORS Behavior pattern, Past/current experiences, and 1-2 comorbidities: breast cancer, meningitis, DM  are also affecting patient's functional outcome.   REHAB POTENTIAL: Good  CLINICAL DECISION MAKING: Stable/uncomplicated  EVALUATION COMPLEXITY: Moderate   GOALS: Goals reviewed with patient? Yes  SHORT TERM GOALS: Target date: 04/14/2022(Remove Blue Hyperlink)  I with basic HEP Baseline: Goal status: INITIAL  LONG TERM GOALS: Target date: 06/09/2022  (Remove Blue Hyperlink)  I with final HEP Baseline:  Goal status: INITIAL  2.  Patient will complete 5 x STS in < 14 sec to demonstrate improved strength in BLE Baseline: 28 Goal status: INITIAL  3.  Patient will achieve at least 55 on FOTO to demonstrate improved functional mobility Baseline: 48 Goal status: INITIAL  4.  Patient will perform over elevation of R shoulder with pain < 3/10 Baseline: 5/10 Goal status: INITIAL  5.  Increase R shoulder strength to at least 4/5 with pain < 3/10 Baseline: (3+)-4-/5 pain 5/10 Goal status: INITIAL  6.  Patient will ambulate at least 400' with LRAD, MI, normalized gait pattern. Baseline: Clinic distances with RW, effortful, antalgic, increased lateral sway, slow speed. Goal status: INITIAL   PLAN: PT FREQUENCY: 2x/week  PT DURATION: 12 weeks  PLANNED INTERVENTIONS: Therapeutic exercises, Therapeutic activity, Neuromuscular re-education, Balance training, Gait training, Patient/Family education, Self Care, Joint mobilization, Dry Needling, Cryotherapy, Moist heat, Vasopneumatic device, Ionotophoresis '4mg'$ /ml Dexamethasone, and Manual therapy  PLAN FOR NEXT SESSION: Update HEP as  tolerated. Strenthening to trunk and LE   Marcelina Morel, DPT 03/20/2022, 1:55 PM

## 2022-03-21 ENCOUNTER — Other Ambulatory Visit: Payer: Self-pay | Admitting: General Surgery

## 2022-03-21 DIAGNOSIS — N644 Mastodynia: Secondary | ICD-10-CM

## 2022-03-21 DIAGNOSIS — Z9889 Other specified postprocedural states: Secondary | ICD-10-CM

## 2022-03-25 NOTE — Therapy (Signed)
OUTPATIENT PHYSICAL THERAPY SHOULDER TREATMENT   Patient Name: Lisa [name] MRN: [id number] DOB:1961/05/18, 61 y.o., female Today's Date: 03/26/2022   PT End of Session - 03/26/22 1448     Visit Number 3    Date for PT Re-Evaluation 06/09/22    PT Start Time 7989    PT Stop Time 1530    PT Time Calculation (min) 45 min    Activity Tolerance Patient tolerated treatment well    Behavior During Therapy [facility] for tasks assessed/performed               Past Medical History:  Diagnosis Date   Anxiety    Arthritis    Breast cancer ([place]) 06/28/21   lump found   Breast cancer ([place])    left breast [facility]   Breast hematoma 12/02/2021   Breast mass, left 07/10/2021   Bulging disc L-5   Chicken pox    Depression    Diabetes mellitus    Hyperlipidemia    Hypertension    Insomnia    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    Meningitis 06/11/2021   bacterial meningitis   Polycystic ovarian disease    Past Surgical History:  Procedure Laterality Date   BREAST LUMPECTOMY Left 08/2021   BREAST LUMPECTOMY WITH SENTINEL LYMPH NODE BIOPSY Left 08/23/2021   Procedure: LEFT BREAST LUMPECTOMY WITH SENTINEL LYMPH NODE BIOPSY;  Surgeon: Stark Klein, MD;  Location: [place];  Service: General;  Laterality: Left;   csf leak in skull  06/10/2003   noted failed attempt to close CSF leak in skull   DILATION AND EVACUATION  06/09/1988   miscarriage   IR FLUORO GUIDED NEEDLE PLC ASPIRATION/INJECTION LOC  06/12/2021   RADIOLOGY WITH ANESTHESIA N/A 06/12/2021   Procedure: IR WITH ANESTHESIA;  Surgeon: Radiologist, Medication, MD;  Location: [place];  Service: Radiology;  Laterality: N/A;   TONSILLECTOMY AND ADENOIDECTOMY  06/10/1971   Patient Active Problem List   Diagnosis Date Noted   Hyperglycemia due to type 2 diabetes mellitus ([place]) 01/21/2022   Degeneration of lumbar intervertebral disc 01/21/2022   Breast hematoma 12/02/2021   Genetic  testing 08/22/2021   Family history of breast cancer 08/14/2021   Malignant neoplasm of upper-outer quadrant of left breast in female, estrogen receptor positive ([place]) 08/12/2021   Breast mass, left 07/10/2021   Fever    Bacterial meningitis    Right sided weakness    Pressure injury of skin 06/13/2021   Sepsis due to Haemophilus influenzae with acute hypoxic respiratory failure and septic shock ([place])    Acute bacterial meningitis 21/19/4174   Acute metabolic encephalopathy 01/20/4817   Altered mental status 06/12/2021   Obesity, morbid, BMI 40.0-49.9 ([place]) 03/24/2016   Family history of breast cancer in sister 10/21/2013   Tick bite of back 10/21/2012   Screening for malignant neoplasm of the cervix 05/19/2012   Routine gynecological examination 05/19/2012   Tobacco use 12/30/2011   Diabetes mellitus, type 2 ([place]) 12/29/2011   HTN (hypertension) 12/29/2011   Hyperlipidemia 12/29/2011   CSF leak 12/29/2011   Psoriasis 12/29/2011   PCOS (polycystic ovarian syndrome) 12/29/2011   Depression with anxiety 12/29/2011    PCP: Collene Leyden  REFERRING PROVIDER: Inez Catalina, MD   REFERRING DIAG: [phone] (ICD-10-CM) - Strain of muscle(s) and tendon(s) of the rotator cuff of right shoulder, initial encounter   THERAPY DIAG:  Difficulty in walking, not elsewhere classified  Muscle weakness (generalized)  Chronic pain of right knee  Chronic right shoulder pain  Abnormal posture  Unsteadiness on feet  Rationale for Evaluation and Treatment Rehabilitation  ONSET DATE: 02/12/2022   SUBJECTIVE:                                                                                                                                                                                      SUBJECTIVE STATEMENT: I am in pain today, especially in my left calf. 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   PERTINENT HISTORY: Patient with high grade partial-thickness  tear to R supraspinatus muscle. Identified in Jan 2023, but she developed breast cancer, so her shoulder treatment was put on hold. She was just recently being followed for Lymphedma PT  Chronic B knee pain, R > L. Received corticosteroid injection in R knee 02/12/22. Chronic back pain has resolved.   PAIN:  Are you having pain? Yes: NPRS scale: 7/10 Pain location: R arm, R knee, Pain description: arm pain-tingling, burning in am, R knee starts with no pain, increases during the day. Aggravating factors: Walking as her knee pain increases, she uses her UE more on walker. Relieving factors: Medication, movement in R arm  PRECAUTIONS: None  WEIGHT BEARING RESTRICTIONS No  FALLS:  Has patient fallen in last 6 months? Yes. Number of falls 1 She was sweeping and felt a pinch and pain shot down her leg, causing her to fall.  LIVING ENVIRONMENT: Lives with: lives with their spouse Lives in: House/apartment Stairs: Yes: Internal: 12 steps; can reach both and External: 1 steps; none, able to live on main level Has following equipment at home: Gilford Rile - 2 wheeled, Environmental consultant - 4 wheeled, Manufacturing engineer  OCCUPATION: N/A  PLOF: Independent  PATIENT GOALS Improve mobility, decrease pain  OBJECTIVE:   DIAGNOSTIC FINDINGS:  Previous MRI showed L4-L5 disc herniation with mass effect at L5 nerve root  PATIENT SURVEYS:  FOTO 48.4  COGNITION:  Overall cognitive status: Within functional limits for tasks assessed     SENSATION: N/T  POSTURE: Round shoulders, forward head, flexed posture.  UPPER EXTREMITY ROM: All R shoulder movement is painful. LUE ROM WFL  Active ROM Right eval Left eval  Shoulder flexion 138   Shoulder extension WNL   Shoulder abduction [facility]   Shoulder adduction    Shoulder internal rotation [facility]   Shoulder external rotation [facility]   Elbow flexion    Elbow extension    Wrist flexion    Wrist extension    Wrist ulnar deviation    Wrist radial deviation    Wrist  pronation  Wrist supination    (Blank rows = not tested)  UPPER EXTREMITY MMT: all R shoulder movements painful, LUE WFL  MMT Right eval Left eval  Shoulder flexion 3+   Shoulder extension 4-   Shoulder abduction 3+   Shoulder adduction    Shoulder internal rotation 3+   Shoulder external rotation 3+   Middle trapezius    Lower trapezius    Elbow flexion 4   Elbow extension 4   Wrist flexion    Wrist extension    Wrist ulnar deviation    Wrist radial deviation    Wrist pronation    Wrist supination    Grip strength (lbs)    (Blank rows = not tested)  SHOULDER SPECIAL TESTS:  Impingement tests: Hawkins/Kennedy impingement test: positive    Rotator cuff assessment: Empty can test: positive  and Belly press test: negative    JOINT MOBILITY TESTING:  B shoulder inferior and posterior glide limited, moderate.   FUNCTIONAL ASSESSMENT   5X STS-27.97  PALPATION:  TTP over supraspinatus muscle and tendon, tension noted in B up traps.   TODAY'S TREATMENT:  03/26/22 NuStep L5 x77mns  Shoulder rows and ext greenTB 2x10  ER with scap retraction redTB 2x10 Shoulder flexion 2# 2x10 Shoulder abd 2# 2x10  LAQ 3# 2x10 each side 3# hip abduction 2x10 each side  Standing HS curls 2x10 each side  Ball squeezes 2x10  03/20/22 NuStep L5 x 57ms  LAQ 2# 2x10  Marching in RW 2#  HS curls greenTB 2x10 STS from elevated mat table 2x10 Shoulder flexion 3# WaTe 2x10 ER with scapular retraction 2x10     PATIENT EDUCATION: Education details: POC, HEP Person educated: Patient Education method: ExConsulting civil engineerDeMedia plannerand Handouts Education comprehension: verbalized understanding   HOME EXERCISE PROGRAM: [id number]  ASSESSMENT:  CLINICAL IMPRESSION: Patient returns with no changes in pain for knees or shoulders, also having some pain in left calf today. We continued working on LE and USKoreatrengthening. Difficulty with LAQ on right leg and with standing single leg  weight bearing on R knee. Some difficulty with 2# UE task, but able to complete sets and reps.   OBJECTIVE IMPAIRMENTS Abnormal gait, decreased activity tolerance, decreased balance, decreased coordination, decreased endurance, decreased mobility, difficulty walking, decreased ROM, decreased strength, impaired UE functional use, improper body mechanics, postural dysfunction, obesity, and pain.   ACTIVITY LIMITATIONS carrying, lifting, bending, sitting, standing, squatting, sleeping, stairs, transfers, reach over head, and locomotion level  PARTICIPATION LIMITATIONS: meal prep, cleaning, laundry, community activity, and yard work  PERSONAL FACTORS Behavior pattern, Past/current experiences, and 1-2 comorbidities: breast cancer, meningitis, DM  are also affecting patient's functional outcome.   REHAB POTENTIAL: Good  CLINICAL DECISION MAKING: Stable/uncomplicated  EVALUATION COMPLEXITY: Moderate   GOALS: Goals reviewed with patient? Yes  SHORT TERM GOALS: Target date: 04/14/2022(Remove Blue Hyperlink)  I with basic HEP Baseline: Goal status: INITIAL  LONG TERM GOALS: Target date: 06/09/2022  (Remove Blue Hyperlink)  I with final HEP Baseline:  Goal status: INITIAL  2.  Patient will complete 5 x STS in < 14 sec to demonstrate improved strength in BLE Baseline: 28 Goal status: INITIAL  3.  Patient will achieve at least 55 on FOTO to demonstrate improved functional mobility Baseline: 48 Goal status: INITIAL  4.  Patient will perform over elevation of R shoulder with pain < 3/10 Baseline: 5/10 Goal status: INITIAL  5.  Increase R shoulder strength to at least 4/5 with pain < 3/10 Baseline: (3+)-4-/5 pain  5/10 Goal status: INITIAL  6.  Patient will ambulate at least 400' with LRAD, MI, normalized gait pattern. Baseline: Clinic distances with RW, effortful, antalgic, increased lateral sway, slow speed. Goal status: INITIAL   PLAN: PT FREQUENCY: 2x/week  PT DURATION: 12  weeks  PLANNED INTERVENTIONS: Therapeutic exercises, Therapeutic activity, Neuromuscular re-education, Balance training, Gait training, Patient/Family education, Self Care, Joint mobilization, Dry Needling, Cryotherapy, Moist heat, Vasopneumatic device, Ionotophoresis '4mg'$ /ml Dexamethasone, and Manual therapy  PLAN FOR NEXT SESSION: Update HEP as tolerated. Strenthening to trunk and LE   Marcelina Morel, DPT 03/26/2022, 3:27 PM

## 2022-03-26 ENCOUNTER — Ambulatory Visit: Payer: Commercial Managed Care - HMO

## 2022-03-26 DIAGNOSIS — M6281 Muscle weakness (generalized): Secondary | ICD-10-CM

## 2022-03-26 DIAGNOSIS — R262 Difficulty in walking, not elsewhere classified: Secondary | ICD-10-CM

## 2022-03-26 DIAGNOSIS — R293 Abnormal posture: Secondary | ICD-10-CM | POA: Diagnosis not present

## 2022-03-26 DIAGNOSIS — G8929 Other chronic pain: Secondary | ICD-10-CM

## 2022-03-26 DIAGNOSIS — R2681 Unsteadiness on feet: Secondary | ICD-10-CM

## 2022-03-27 ENCOUNTER — Other Ambulatory Visit: Payer: Self-pay | Admitting: *Deleted

## 2022-03-27 DIAGNOSIS — Z87891 Personal history of nicotine dependence: Secondary | ICD-10-CM

## 2022-03-27 DIAGNOSIS — F17210 Nicotine dependence, cigarettes, uncomplicated: Secondary | ICD-10-CM

## 2022-03-27 DIAGNOSIS — Z122 Encounter for screening for malignant neoplasm of respiratory organs: Secondary | ICD-10-CM

## 2022-03-28 ENCOUNTER — Ambulatory Visit: Payer: Commercial Managed Care - HMO | Admitting: Physical Therapy

## 2022-03-28 ENCOUNTER — Telehealth: Payer: Self-pay | Admitting: *Deleted

## 2022-03-28 DIAGNOSIS — G8929 Other chronic pain: Secondary | ICD-10-CM

## 2022-03-28 DIAGNOSIS — M6281 Muscle weakness (generalized): Secondary | ICD-10-CM

## 2022-03-28 DIAGNOSIS — R293 Abnormal posture: Secondary | ICD-10-CM | POA: Diagnosis not present

## 2022-03-28 DIAGNOSIS — R262 Difficulty in walking, not elsewhere classified: Secondary | ICD-10-CM

## 2022-03-28 NOTE — Telephone Encounter (Signed)
Left message for pt to call back. Need to reschedule LDCT at [place] due to insurance.

## 2022-03-28 NOTE — Therapy (Signed)
OUTPATIENT PHYSICAL THERAPY SHOULDER TREATMENT   Patient Name: Lisa [name] MRN: [id number] DOB:01-Sep-1960, 61 y.o., female Today's Date: 03/28/2022   PT End of Session - 03/28/22 1011     Visit Number 4    Number of Visits 12    Date for PT Re-Evaluation 06/09/22    PT Start Time 1010    PT Stop Time 1100    PT Time Calculation (min) 50 min               Past Medical History:  Diagnosis Date   Anxiety    Arthritis    Breast cancer ([place]) 06/28/21   lump found   Breast cancer ([place])    left breast [facility]   Breast hematoma 12/02/2021   Breast mass, left 07/10/2021   Bulging disc L-5   Chicken pox    Depression    Diabetes mellitus    Hyperlipidemia    Hypertension    Insomnia    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    Meningitis 06/11/2021   bacterial meningitis   Polycystic ovarian disease    Past Surgical History:  Procedure Laterality Date   BREAST LUMPECTOMY Left 08/2021   BREAST LUMPECTOMY WITH SENTINEL LYMPH NODE BIOPSY Left 08/23/2021   Procedure: LEFT BREAST LUMPECTOMY WITH SENTINEL LYMPH NODE BIOPSY;  Surgeon: Stark Klein, MD;  Location: [place];  Service: General;  Laterality: Left;   csf leak in skull  06/10/2003   noted failed attempt to close CSF leak in skull   DILATION AND EVACUATION  06/09/1988   miscarriage   IR FLUORO GUIDED NEEDLE PLC ASPIRATION/INJECTION LOC  06/12/2021   RADIOLOGY WITH ANESTHESIA N/A 06/12/2021   Procedure: IR WITH ANESTHESIA;  Surgeon: Radiologist, Medication, MD;  Location: [place];  Service: Radiology;  Laterality: N/A;   TONSILLECTOMY AND ADENOIDECTOMY  06/10/1971   Patient Active Problem List   Diagnosis Date Noted   Hyperglycemia due to type 2 diabetes mellitus ([place]) 01/21/2022   Degeneration of lumbar intervertebral disc 01/21/2022   Breast hematoma 12/02/2021   Genetic testing 08/22/2021   Family history of breast cancer 08/14/2021   Malignant neoplasm of  upper-outer quadrant of left breast in female, estrogen receptor positive ([place]) 08/12/2021   Breast mass, left 07/10/2021   Fever    Bacterial meningitis    Right sided weakness    Pressure injury of skin 06/13/2021   Sepsis due to Haemophilus influenzae with acute hypoxic respiratory failure and septic shock ([place])    Acute bacterial meningitis 40/81/4481   Acute metabolic encephalopathy 85/63/1497   Altered mental status 06/12/2021   Obesity, morbid, BMI 40.0-49.9 ([place]) 03/24/2016   Family history of breast cancer in sister 10/21/2013   Tick bite of back 10/21/2012   Screening for malignant neoplasm of the cervix 05/19/2012   Routine gynecological examination 05/19/2012   Tobacco use 12/30/2011   Diabetes mellitus, type 2 ([place]) 12/29/2011   HTN (hypertension) 12/29/2011   Hyperlipidemia 12/29/2011   CSF leak 12/29/2011   Psoriasis 12/29/2011   PCOS (polycystic ovarian syndrome) 12/29/2011   Depression with anxiety 12/29/2011    PCP: Collene Leyden  REFERRING PROVIDER: Inez Catalina, MD   REFERRING DIAG: [phone] (ICD-10-CM) - Strain of muscle(s) and tendon(s) of the rotator cuff of right shoulder, initial encounter   THERAPY DIAG:  Difficulty in walking, not elsewhere classified  Muscle weakness (generalized)  Chronic pain of right knee  Chronic right shoulder pain  Rationale  for Evaluation and Treatment Rehabilitation  ONSET DATE: 02/12/2022   SUBJECTIVE:                                                                                                                                                                                      SUBJECTIVE STATEMENT: calf pain better, knee is shot PERTINENT HISTORY: Patient with high grade partial-thickness tear to R supraspinatus muscle. Identified in Jan 2023, but she developed breast cancer, so her shoulder treatment was put on hold. She was just recently being followed for Lymphedma PT  Chronic B knee pain, R > L. Received  corticosteroid injection in R knee 02/12/22. Chronic back pain has resolved.   PAIN:  Are you having pain? Yes: NPRS scale: 7/10 Pain location: R arm, R knee, Pain description: arm pain-tingling, burning in am, R knee starts with no pain, increases during the day. Aggravating factors: Walking as her knee pain increases, she uses her UE more on walker. Relieving factors: Medication, movement in R arm  PRECAUTIONS: None  WEIGHT BEARING RESTRICTIONS No  FALLS:  Has patient fallen in last 6 months? Yes. Number of falls 1 She was sweeping and felt a pinch and pain shot down her leg, causing her to fall.  LIVING ENVIRONMENT: Lives with: lives with their spouse Lives in: House/apartment Stairs: Yes: Internal: 12 steps; can reach both and External: 1 steps; none, able to live on main level Has following equipment at home: Gilford Rile - 2 wheeled, Environmental consultant - 4 wheeled, Manufacturing engineer  OCCUPATION: N/A  PLOF: Independent  PATIENT GOALS Improve mobility, decrease pain  OBJECTIVE:   DIAGNOSTIC FINDINGS:  Previous MRI showed L4-L5 disc herniation with mass effect at L5 nerve root  PATIENT SURVEYS:  FOTO 48.4  COGNITION:  Overall cognitive status: Within functional limits for tasks assessed     SENSATION: N/T  POSTURE: Round shoulders, forward head, flexed posture.  UPPER EXTREMITY ROM: All R shoulder movement is painful. LUE ROM WFL  Active ROM Right eval Left eval  Shoulder flexion 138   Shoulder extension WNL   Shoulder abduction [facility]   Shoulder adduction    Shoulder internal rotation WFL   Shoulder external rotation [facility]   Elbow flexion    Elbow extension    Wrist flexion    Wrist extension    Wrist ulnar deviation    Wrist radial deviation    Wrist pronation    Wrist supination    (Blank rows = not tested)  UPPER EXTREMITY MMT: all R shoulder movements painful, LUE WFL  MMT Right eval Left eval  Shoulder flexion 3+   Shoulder extension 4-   Shoulder abduction 3+  Shoulder adduction    Shoulder internal rotation 3+   Shoulder external rotation 3+   Middle trapezius    Lower trapezius    Elbow flexion 4   Elbow extension 4   Wrist flexion    Wrist extension    Wrist ulnar deviation    Wrist radial deviation    Wrist pronation    Wrist supination    Grip strength (lbs)    (Blank rows = not tested)  SHOULDER SPECIAL TESTS:  Impingement tests: Hawkins/Kennedy impingement test: positive    Rotator cuff assessment: Empty can test: positive  and Belly press test: negative    JOINT MOBILITY TESTING:  B shoulder inferior and posterior glide limited, moderate.   FUNCTIONAL ASSESSMENT   5X STS-27.97  PALPATION:  TTP over supraspinatus muscle and tendon, tension noted in B up traps.   TODAY'S TREATMENT:   03/28/22 Nustep L 5 56mn Red tband HS curl 2 sets 10 3# LAQ 2 sets 10 Red tband seated row, shld ext and ER 2 sets 10 3# hip flex,ext and abd. Marching 2 sets 10 with RW 3# cane ex chest press, flex, OH and abd 10 x   03/26/22 NuStep L5 x636ms  Shoulder rows and ext greenTB 2x10  ER with scap retraction redTB 2x10 Shoulder flexion 2# 2x10 Shoulder abd 2# 2x10  LAQ 3# 2x10 each side 3# hip abduction 2x10 each side  Standing HS curls 2x10 each side  Ball squeezes 2x10  03/20/22 NuStep L5 x 2m46m  LAQ 2# 2x10  Marching in RW 2#  HS curls greenTB 2x10 STS from elevated mat table 2x10 Shoulder flexion 3# WaTe 2x10 ER with scapular retraction 2x10     PATIENT EDUCATION: Education details: POC, HEP Person educated: Patient Education method: ExpConsulting civil engineeremMedia plannernd Handouts Education comprehension: verbalized understanding   HOME EXERCISE PROGRAM: [id number]  ASSESSMENT:  CLINICAL IMPRESSION: Progressed ex  and alternated seated and standing as wt bearing increases RT knee pain. Pt verb doing HEP- STG met  OBJECTIVE IMPAIRMENTS Abnormal gait, decreased activity tolerance, decreased balance, decreased  coordination, decreased endurance, decreased mobility, difficulty walking, decreased ROM, decreased strength, impaired UE functional use, improper body mechanics, postural dysfunction, obesity, and pain.   ACTIVITY LIMITATIONS carrying, lifting, bending, sitting, standing, squatting, sleeping, stairs, transfers, reach over head, and locomotion level  PARTICIPATION LIMITATIONS: meal prep, cleaning, laundry, community activity, and yard work  PERSONAL FACTORS Behavior pattern, Past/current experiences, and 1-2 comorbidities: breast cancer, meningitis, DM  are also affecting patient's functional outcome.   REHAB POTENTIAL: Good  CLINICAL DECISION MAKING: Stable/uncomplicated  EVALUATION COMPLEXITY: Moderate   GOALS: Goals reviewed with patient? Yes  SHORT TERM GOALS: Target date: 04/14/2022(Remove Blue Hyperlink)  I with basic HEP Baseline: Goal status: met 03/28/22  LONG TERM GOALS: Target date: 06/09/2022  (Remove Blue Hyperlink)  I with final HEP Baseline:  Goal status: INITIAL  2.  Patient will complete 5 x STS in < 14 sec to demonstrate improved strength in BLE Baseline: 28 Goal status: INITIAL  3.  Patient will achieve at least 55 on FOTO to demonstrate improved functional mobility Baseline: 48 Goal status: INITIAL  4.  Patient will perform over elevation of R shoulder with pain < 3/10 Baseline: 5/10 Goal status: INITIAL  5.  Increase R shoulder strength to at least 4/5 with pain < 3/10 Baseline: (3+)-4-/5 pain 5/10 Goal status: INITIAL  6.  Patient will ambulate at least 400' with LRAD, MI, normalized gait pattern. Baseline: Clinic distances with RW, effortful,  antalgic, increased lateral sway, slow speed. Goal status: INITIAL   PLAN: PT FREQUENCY: 2x/week  PT DURATION: 12 weeks  PLANNED INTERVENTIONS: Therapeutic exercises, Therapeutic activity, Neuromuscular re-education, Balance training, Gait training, Patient/Family education, Self Care, Joint  mobilization, Dry Needling, Cryotherapy, Moist heat, Vasopneumatic device, Ionotophoresis 10m/ml Dexamethasone, and Manual therapy  PLAN FOR NEXT SESSION: Update HEP as tolerated. Strenthening to trunk and LE   Angie Tyronica [place] PTA 03/28/2022, 10:12 AM [place] [address]Phone: [phone]  Fax:  [phone] Patient Details  Name: DSLOANE PALMERMRN: [id number] of Birth: 102-07-1962Referring Provider:  HCollene Leyden MD  Encounter Date: 03/28/2022   PLaqueta Carina PTA 03/28/2022, 10:12 AM  [place] [id number]: [phone]  Fax:  [phone]

## 2022-03-31 ENCOUNTER — Ambulatory Visit: Payer: Commercial Managed Care - HMO | Admitting: Physical Therapy

## 2022-03-31 ENCOUNTER — Encounter: Payer: Self-pay | Admitting: Physical Therapy

## 2022-03-31 ENCOUNTER — Other Ambulatory Visit (HOSPITAL_BASED_OUTPATIENT_CLINIC_OR_DEPARTMENT_OTHER): Payer: Self-pay

## 2022-03-31 DIAGNOSIS — R293 Abnormal posture: Secondary | ICD-10-CM | POA: Diagnosis not present

## 2022-03-31 DIAGNOSIS — M6281 Muscle weakness (generalized): Secondary | ICD-10-CM

## 2022-03-31 DIAGNOSIS — R262 Difficulty in walking, not elsewhere classified: Secondary | ICD-10-CM

## 2022-03-31 DIAGNOSIS — R2681 Unsteadiness on feet: Secondary | ICD-10-CM

## 2022-03-31 DIAGNOSIS — G8929 Other chronic pain: Secondary | ICD-10-CM

## 2022-03-31 NOTE — Telephone Encounter (Signed)
Pt called back and left VM that she is ok with going to [place] for scan.  Called and left VM for pt that CT scan has been scheduled at [place] for 04/16/22 1:00 and I left my call back number if she needs to change appt for any reason.

## 2022-03-31 NOTE — Therapy (Signed)
OUTPATIENT PHYSICAL THERAPY SHOULDER TREATMENT   Patient Name: Lisa [name] MRN: [id number] DOB:01-May-1961, 61 y.o., female Today's Date: 03/31/2022   PT End of Session - 03/31/22 1722     Visit Number 5    Date for PT Re-Evaluation 06/09/22    PT Start Time 1717    PT Stop Time 1756    PT Time Calculation (min) 39 min    Activity Tolerance Patient tolerated treatment well    Behavior During Therapy [facility] for tasks assessed/performed                Past Medical History:  Diagnosis Date   Anxiety    Arthritis    Breast cancer ([place]) 06/28/21   lump found   Breast cancer ([place])    left breast [facility]   Breast hematoma 12/02/2021   Breast mass, left 07/10/2021   Bulging disc L-5   Chicken pox    Depression    Diabetes mellitus    Hyperlipidemia    Hypertension    Insomnia    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    Meningitis 06/11/2021   bacterial meningitis   Polycystic ovarian disease    Past Surgical History:  Procedure Laterality Date   BREAST LUMPECTOMY Left 08/2021   BREAST LUMPECTOMY WITH SENTINEL LYMPH NODE BIOPSY Left 08/23/2021   Procedure: LEFT BREAST LUMPECTOMY WITH SENTINEL LYMPH NODE BIOPSY;  Surgeon: Stark Klein, MD;  Location: [place];  Service: General;  Laterality: Left;   csf leak in skull  06/10/2003   noted failed attempt to close CSF leak in skull   DILATION AND EVACUATION  06/09/1988   miscarriage   IR FLUORO GUIDED NEEDLE PLC ASPIRATION/INJECTION LOC  06/12/2021   RADIOLOGY WITH ANESTHESIA N/A 06/12/2021   Procedure: IR WITH ANESTHESIA;  Surgeon: Radiologist, Medication, MD;  Location: [place];  Service: Radiology;  Laterality: N/A;   TONSILLECTOMY AND ADENOIDECTOMY  06/10/1971   Patient Active Problem List   Diagnosis Date Noted   Hyperglycemia due to type 2 diabetes mellitus ([place]) 01/21/2022   Degeneration of lumbar intervertebral disc 01/21/2022   Breast hematoma 12/02/2021   Genetic  testing 08/22/2021   Family history of breast cancer 08/14/2021   Malignant neoplasm of upper-outer quadrant of left breast in female, estrogen receptor positive ([place]) 08/12/2021   Breast mass, left 07/10/2021   Fever    Bacterial meningitis    Right sided weakness    Pressure injury of skin 06/13/2021   Sepsis due to Haemophilus influenzae with acute hypoxic respiratory failure and septic shock ([place])    Acute bacterial meningitis 32/91/9166   Acute metabolic encephalopathy 06/00/4599   Altered mental status 06/12/2021   Obesity, morbid, BMI 40.0-49.9 ([place]) 03/24/2016   Family history of breast cancer in sister 10/21/2013   Tick bite of back 10/21/2012   Screening for malignant neoplasm of the cervix 05/19/2012   Routine gynecological examination 05/19/2012   Tobacco use 12/30/2011   Diabetes mellitus, type 2 ([place]) 12/29/2011   HTN (hypertension) 12/29/2011   Hyperlipidemia 12/29/2011   CSF leak 12/29/2011   Psoriasis 12/29/2011   PCOS (polycystic ovarian syndrome) 12/29/2011   Depression with anxiety 12/29/2011    PCP: Collene Leyden  REFERRING PROVIDER: Inez Catalina, MD   REFERRING DIAG: [phone] (ICD-10-CM) - Strain of muscle(s) and tendon(s) of the rotator cuff of right shoulder, initial encounter   THERAPY DIAG:  Difficulty in walking, not elsewhere classified  Muscle weakness (generalized)  Chronic pain of right knee  Chronic right shoulder pain  Unsteadiness on feet  Abnormal posture  Rationale for Evaluation and Treatment Rehabilitation  ONSET DATE: 02/12/2022   SUBJECTIVE:                                                                                                                                                                                      SUBJECTIVE STATEMENT: Patient reports she is mobilizing better. Still has some knee pain.  PERTINENT HISTORY: Patient with high grade partial-thickness tear to R supraspinatus muscle. Identified in Jan  2023, but she developed breast cancer, so her shoulder treatment was put on hold. She was just recently being followed for Lymphedma PT  Chronic B knee pain, R > L. Received corticosteroid injection in R knee 02/12/22. Chronic back pain has resolved.   PAIN:  Are you having pain? Yes: NPRS scale: 7/10 Pain location: R arm, R knee, Pain description: arm pain-tingling, burning in am, R knee starts with no pain, increases during the day. Aggravating factors: Walking as her knee pain increases, she uses her UE more on walker. Relieving factors: Medication, movement in R arm  PRECAUTIONS: None  WEIGHT BEARING RESTRICTIONS No  FALLS:  Has patient fallen in last 6 months? Yes. Number of falls 1 She was sweeping and felt a pinch and pain shot down her leg, causing her to fall.  LIVING ENVIRONMENT: Lives with: lives with their spouse Lives in: House/apartment Stairs: Yes: Internal: 12 steps; can reach both and External: 1 steps; none, able to live on main level Has following equipment at home: Gilford Rile - 2 wheeled, Environmental consultant - 4 wheeled, Manufacturing engineer  OCCUPATION: N/A  PLOF: Independent  PATIENT GOALS Improve mobility, decrease pain  OBJECTIVE:   DIAGNOSTIC FINDINGS:  Previous MRI showed L4-L5 disc herniation with mass effect at L5 nerve root  PATIENT SURVEYS:  FOTO 48.4  COGNITION:  Overall cognitive status: Within functional limits for tasks assessed     SENSATION: N/T  POSTURE: Round shoulders, forward head, flexed posture.  UPPER EXTREMITY ROM: All R shoulder movement is painful. LUE ROM WFL  Active ROM Right eval Left eval  Shoulder flexion 138   Shoulder extension WNL   Shoulder abduction [facility]   Shoulder adduction    Shoulder internal rotation [facility]   Shoulder external rotation [facility]   Elbow flexion    Elbow extension    Wrist flexion    Wrist extension    Wrist ulnar deviation    Wrist radial deviation    Wrist pronation    Wrist supination    (Blank rows = not  tested)  UPPER EXTREMITY MMT: all  R shoulder movements painful, LUE WFL  MMT Right eval Left eval  Shoulder flexion 3+   Shoulder extension 4-   Shoulder abduction 3+   Shoulder adduction    Shoulder internal rotation 3+   Shoulder external rotation 3+   Middle trapezius    Lower trapezius    Elbow flexion 4   Elbow extension 4   Wrist flexion    Wrist extension    Wrist ulnar deviation    Wrist radial deviation    Wrist pronation    Wrist supination    Grip strength (lbs)    (Blank rows = not tested)  SHOULDER SPECIAL TESTS:  Impingement tests: Hawkins/Kennedy impingement test: positive    Rotator cuff assessment: Empty can test: positive  and Belly press test: negative    JOINT MOBILITY TESTING:  B shoulder inferior and posterior glide limited, moderate.   FUNCTIONAL ASSESSMENT   5X STS-27.97  PALPATION:  TTP over supraspinatus muscle and tendon, tension noted in B up traps.   TODAY'S TREATMENT:  03/31/22 NuStep L5 x 6 minutes Sit to stand holding 2# WATE bar, with OHP x 10 Repeat with chest press x 10 Standing hip abd against G tband at knees, 2 x 10 reps each leg  Seated knee flexion 15#, BLE, 2 x 10 reps Seated knee extension 5#, BLE 2 x 10 reps Paloff press, 1 x 10 reps on each side with Red Tband, 10 reps with Yellow Tband  03/28/22 Nustep L 5 31mn Red tband HS curl 2 sets 10 3# LAQ 2 sets 10 Red tband seated row, shld ext and ER 2 sets 10 3# hip flex,ext and abd. Marching 2 sets 10 with RW 3# cane ex chest press, flex, OH and abd 10 x   03/26/22 NuStep L5 x642ms  Shoulder rows and ext greenTB 2x10  ER with scap retraction redTB 2x10 Shoulder flexion 2# 2x10 Shoulder abd 2# 2x10  LAQ 3# 2x10 each side 3# hip abduction 2x10 each side  Standing HS curls 2x10 each side  Ball squeezes 2x10  03/20/22 NuStep L5 x 53m54m  LAQ 2# 2x10  Marching in RW 2#  HS curls greenTB 2x10 STS from elevated mat table 2x10 Shoulder flexion 3# WaTe 2x10 ER  with scapular retraction 2x10     PATIENT EDUCATION: Education details: POC, HEP Person educated: Patient Education method: ExpConsulting civil engineeremMedia plannernd Handouts Education comprehension: verbalized understanding   HOME EXERCISE PROGRAM: [id number]  ASSESSMENT:  CLINICAL IMPRESSION: Patient reports that she is mobilizing better. Continued to progress exercises, performing functional strengthening to develop postural stability.  OBJECTIVE IMPAIRMENTS Abnormal gait, decreased activity tolerance, decreased balance, decreased coordination, decreased endurance, decreased mobility, difficulty walking, decreased ROM, decreased strength, impaired UE functional use, improper body mechanics, postural dysfunction, obesity, and pain.   ACTIVITY LIMITATIONS carrying, lifting, bending, sitting, standing, squatting, sleeping, stairs, transfers, reach over head, and locomotion level  PARTICIPATION LIMITATIONS: meal prep, cleaning, laundry, community activity, and yard work  PERSONAL FACTORS Behavior pattern, Past/current experiences, and 1-2 comorbidities: breast cancer, meningitis, DM  are also affecting patient's functional outcome.   REHAB POTENTIAL: Good  CLINICAL DECISION MAKING: Stable/uncomplicated  EVALUATION COMPLEXITY: Moderate   GOALS: Goals reviewed with patient? Yes  SHORT TERM GOALS: Target date: 04/14/2022(Remove Blue Hyperlink)  I with basic HEP Baseline: Goal status: met 03/28/22  LONG TERM GOALS: Target date: 06/09/2022  (Remove Blue Hyperlink)  I with final HEP Baseline:  Goal status: ongoing  2.  Patient will complete 5 x STS in <  14 sec to demonstrate improved strength in BLE Baseline: 28 Goal status: INITIAL  3.  Patient will achieve at least 55 on FOTO to demonstrate improved functional mobility Baseline: 48 Goal status: INITIAL  4.  Patient will perform over elevation of R shoulder with pain < 3/10 Baseline: 5/10 Goal status: ongoing  5.  Increase R  shoulder strength to at least 4/5 with pain < 3/10 Baseline: (3+)-4-/5 pain 5/10 Goal status: ongoing  6.  Patient will ambulate at least 400' with LRAD, MI, normalized gait pattern. Baseline: Clinic distances with RW, effortful, antalgic, increased lateral sway, slow speed. Goal status: INITIAL   PLAN: PT FREQUENCY: 2x/week  PT DURATION: 12 weeks  PLANNED INTERVENTIONS: Therapeutic exercises, Therapeutic activity, Neuromuscular re-education, Balance training, Gait training, Patient/Family education, Self Care, Joint mobilization, Dry Needling, Cryotherapy, Moist heat, Vasopneumatic device, Ionotophoresis 57m/ml Dexamethasone, and Manual therapy  PLAN FOR NEXT SESSION: Update HEP as tolerated. [place] to trunk and LE  Patient Details  Name: DKATHALENE SPORERMRN: [id number] of Birth: [birth year]Referring Provider:  HCollene Leyden MD  Encounter Date: 03/31/2022   SMarcelina Morel DPT 03/31/2022, 5:51 PM  [place] [id number]: [phone]  Fax:  [phone]

## 2022-04-02 ENCOUNTER — Encounter: Payer: Self-pay | Admitting: Physical Therapy

## 2022-04-02 ENCOUNTER — Ambulatory Visit: Payer: Commercial Managed Care - HMO | Admitting: Physical Therapy

## 2022-04-02 DIAGNOSIS — R293 Abnormal posture: Secondary | ICD-10-CM | POA: Diagnosis not present

## 2022-04-02 DIAGNOSIS — G8929 Other chronic pain: Secondary | ICD-10-CM

## 2022-04-02 DIAGNOSIS — R2681 Unsteadiness on feet: Secondary | ICD-10-CM

## 2022-04-02 DIAGNOSIS — M6281 Muscle weakness (generalized): Secondary | ICD-10-CM

## 2022-04-02 DIAGNOSIS — R262 Difficulty in walking, not elsewhere classified: Secondary | ICD-10-CM

## 2022-04-02 NOTE — Therapy (Signed)
OUTPATIENT PHYSICAL THERAPY SHOULDER TREATMENT   Patient Name: Lisa [name] MRN: [id number] DOB:1961/02/27, 61 y.o., female Today's Date: 04/02/2022   PT End of Session - 04/02/22 1019     Visit Number 6    Date for PT Re-Evaluation 06/09/22    PT Start Time 1016    PT Stop Time 1055    PT Time Calculation (min) 39 min    Activity Tolerance Patient tolerated treatment well    Behavior During Therapy [facility] for tasks assessed/performed                 Past Medical History:  Diagnosis Date   Anxiety    Arthritis    Breast cancer ([place]) 06/28/21   lump found   Breast cancer ([place])    left breast [facility]   Breast hematoma 12/02/2021   Breast mass, left 07/10/2021   Bulging disc L-5   Chicken pox    Depression    Diabetes mellitus    Hyperlipidemia    Hypertension    Insomnia    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    Meningitis 06/11/2021   bacterial meningitis   Polycystic ovarian disease    Past Surgical History:  Procedure Laterality Date   BREAST LUMPECTOMY Left 08/2021   BREAST LUMPECTOMY WITH SENTINEL LYMPH NODE BIOPSY Left 08/23/2021   Procedure: LEFT BREAST LUMPECTOMY WITH SENTINEL LYMPH NODE BIOPSY;  Surgeon: Stark Klein, MD;  Location: [place];  Service: General;  Laterality: Left;   csf leak in skull  06/10/2003   noted failed attempt to close CSF leak in skull   DILATION AND EVACUATION  06/09/1988   miscarriage   IR FLUORO GUIDED NEEDLE PLC ASPIRATION/INJECTION LOC  06/12/2021   RADIOLOGY WITH ANESTHESIA N/A 06/12/2021   Procedure: IR WITH ANESTHESIA;  Surgeon: Radiologist, Medication, MD;  Location: [place];  Service: Radiology;  Laterality: N/A;   TONSILLECTOMY AND ADENOIDECTOMY  06/10/1971   Patient Active Problem List   Diagnosis Date Noted   Hyperglycemia due to type 2 diabetes mellitus ([place]) 01/21/2022   Degeneration of lumbar intervertebral disc 01/21/2022   Breast hematoma 12/02/2021    Genetic testing 08/22/2021   Family history of breast cancer 08/14/2021   Malignant neoplasm of upper-outer quadrant of left breast in female, estrogen receptor positive ([place]) 08/12/2021   Breast mass, left 07/10/2021   Fever    Bacterial meningitis    Right sided weakness    Pressure injury of skin 06/13/2021   Sepsis due to Haemophilus influenzae with acute hypoxic respiratory failure and septic shock ([place])    Acute bacterial meningitis 29/52/8413   Acute metabolic encephalopathy 24/40/1027   Altered mental status 06/12/2021   Obesity, morbid, BMI 40.0-49.9 ([place]) 03/24/2016   Family history of breast cancer in sister 10/21/2013   Tick bite of back 10/21/2012   Screening for malignant neoplasm of the cervix 05/19/2012   Routine gynecological examination 05/19/2012   Tobacco use 12/30/2011   Diabetes mellitus, type 2 ([place]) 12/29/2011   HTN (hypertension) 12/29/2011   Hyperlipidemia 12/29/2011   CSF leak 12/29/2011   Psoriasis 12/29/2011   PCOS (polycystic ovarian syndrome) 12/29/2011   Depression with anxiety 12/29/2011    PCP: Collene Leyden  REFERRING PROVIDER: Inez Catalina, MD   REFERRING DIAG: [phone] (ICD-10-CM) - Strain of muscle(s) and tendon(s) of the rotator cuff of right shoulder, initial encounter   THERAPY DIAG:  Difficulty in walking, not elsewhere classified  Muscle weakness (  generalized)  Chronic pain of right knee  Chronic right shoulder pain  Unsteadiness on feet  Abnormal posture  Rationale for Evaluation and Treatment Rehabilitation  ONSET DATE: 02/12/2022   SUBJECTIVE:                                                                                                                                                                                      SUBJECTIVE STATEMENT: Patient reports some hand pain today.  PERTINENT HISTORY: Patient with high grade partial-thickness tear to R supraspinatus muscle. Identified in Jan 2023, but she developed  breast cancer, so her shoulder treatment was put on hold. She was just recently being followed for Lymphedma PT  Chronic B knee pain, R > L. Received corticosteroid injection in R knee 02/12/22. Chronic back pain has resolved.   PAIN:  Are you having pain? Yes: NPRS scale: 7/10 Pain location: R arm, R knee, Pain description: arm pain-tingling, burning in am, R knee starts with no pain, increases during the day. Aggravating factors: Walking as her knee pain increases, she uses her UE more on walker. Relieving factors: Medication, movement in R arm  PRECAUTIONS: None  WEIGHT BEARING RESTRICTIONS No  FALLS:  Has patient fallen in last 6 months? Yes. Number of falls 1 She was sweeping and felt a pinch and pain shot down her leg, causing her to fall.  LIVING ENVIRONMENT: Lives with: lives with their spouse Lives in: House/apartment Stairs: Yes: Internal: 12 steps; can reach both and External: 1 steps; none, able to live on main level Has following equipment at home: Gilford Rile - 2 wheeled, Environmental consultant - 4 wheeled, Manufacturing engineer  OCCUPATION: N/A  PLOF: Independent  PATIENT GOALS Improve mobility, decrease pain  OBJECTIVE:   DIAGNOSTIC FINDINGS:  Previous MRI showed L4-L5 disc herniation with mass effect at L5 nerve root  PATIENT SURVEYS:  FOTO 48.4  COGNITION:  Overall cognitive status: Within functional limits for tasks assessed     SENSATION: N/T  POSTURE: Round shoulders, forward head, flexed posture.  UPPER EXTREMITY ROM: All R shoulder movement is painful. LUE ROM WFL  Active ROM Right eval Left eval  Shoulder flexion 138   Shoulder extension WNL   Shoulder abduction [facility]   Shoulder adduction    Shoulder internal rotation [facility]   Shoulder external rotation [facility]   Elbow flexion    Elbow extension    Wrist flexion    Wrist extension    Wrist ulnar deviation    Wrist radial deviation    Wrist pronation    Wrist supination    (Blank rows = not tested)  UPPER  EXTREMITY MMT: all R shoulder movements  painful, LUE WFL  MMT Right eval Left eval  Shoulder flexion 3+   Shoulder extension 4-   Shoulder abduction 3+   Shoulder adduction    Shoulder internal rotation 3+   Shoulder external rotation 3+   Middle trapezius    Lower trapezius    Elbow flexion 4   Elbow extension 4   Wrist flexion    Wrist extension    Wrist ulnar deviation    Wrist radial deviation    Wrist pronation    Wrist supination    Grip strength (lbs)    (Blank rows = not tested)  SHOULDER SPECIAL TESTS:  Impingement tests: Hawkins/Kennedy impingement test: positive    Rotator cuff assessment: Empty can test: positive  and Belly press test: negative    JOINT MOBILITY TESTING:  B shoulder inferior and posterior glide limited, moderate.   FUNCTIONAL ASSESSMENT   5X STS-27.97  PALPATION:  TTP over supraspinatus muscle and tendon, tension noted in B up traps.   TODAY'S TREATMENT:  04/02/22 Recumbent bike L3.5 x 6 minutes Supine lower trunk rotation x 5 to each side. Supine stretch with strap-HS, ABD, ADD 3 x 10 sec each, each leg Supine bridge over ball with abdominal stabilization x 10, repeat with B hip IR, roll knees to chest on ball x 10, crunch with hands on ball in lap x 10 reps Seated clamshells against G tband x 10 reps Standing hip abd against G tband at knees x 10 reps. Seated shoulder flex and ext with 2# weights, 10 reps each. Abd, tricep ext, ER with 1# weights, 10 Scap retraction against G tband resistance x 10  03/31/22 NuStep L5 x 6 minutes Sit to stand holding 2# WATE bar, with OHP x 10 Repeat with chest press x 10 Standing hip abd against G tband at knees, 2 x 10 reps each leg  Seated knee flexion 15#, BLE, 2 x 10 reps Seated knee extension 5#, BLE 2 x 10 reps Paloff press, 1 x 10 reps on each side with Red Tband, 10 reps with Yellow Tband  03/28/22 Nustep L 5 52mn Red tband HS curl 2 sets 10 3# LAQ 2 sets 10 Red tband seated row,  shld ext and ER 2 sets 10 3# hip flex,ext and abd. Marching 2 sets 10 with RW 3# cane ex chest press, flex, OH and abd 10 x   03/26/22 NuStep L5 x642ms  Shoulder rows and ext greenTB 2x10  ER with scap retraction redTB 2x10 Shoulder flexion 2# 2x10 Shoulder abd 2# 2x10  LAQ 3# 2x10 each side 3# hip abduction 2x10 each side  Standing HS curls 2x10 each side  Ball squeezes 2x10  03/20/22 NuStep L5 x 63m2m  LAQ 2# 2x10  Marching in RW 2#  HS curls greenTB 2x10 STS from elevated mat table 2x10 Shoulder flexion 3# WaTe 2x10 ER with scapular retraction 2x10     PATIENT EDUCATION: Education details: POC, HEP Person educated: Patient Education method: ExpConsulting civil engineeremMedia plannernd Handouts Education comprehension: verbalized understanding   HOME EXERCISE PROGRAM: [id number]  ASSESSMENT:  CLINICAL IMPRESSION: Patient reports sore hand today. After getting off bike, reported l leg pain. Performed LE stretching for relief. Continue to emphasize strength, balance, and postural control to progress her mobility.  OBJECTIVE IMPAIRMENTS Abnormal gait, decreased activity tolerance, decreased balance, decreased coordination, decreased endurance, decreased mobility, difficulty walking, decreased ROM, decreased strength, impaired UE functional use, improper body mechanics, postural dysfunction, obesity, and pain.   ACTIVITY LIMITATIONS carrying, lifting, bending,  sitting, standing, squatting, sleeping, stairs, transfers, reach over head, and locomotion level  PARTICIPATION LIMITATIONS: meal prep, cleaning, laundry, community activity, and yard work  PERSONAL FACTORS Behavior pattern, Past/current experiences, and 1-2 comorbidities: breast cancer, meningitis, DM  are also affecting patient's functional outcome.   REHAB POTENTIAL: Good  CLINICAL DECISION MAKING: Stable/uncomplicated  EVALUATION COMPLEXITY: Moderate   GOALS: Goals reviewed with patient? Yes  SHORT TERM GOALS:  Target date: 04/14/2022(Remove Blue Hyperlink)  I with basic HEP Baseline: Goal status: met 03/28/22  LONG TERM GOALS: Target date: 06/09/2022  (Remove Blue Hyperlink)  I with final HEP Baseline:  Goal status: ongoing  2.  Patient will complete 5 x STS in < 14 sec to demonstrate improved strength in BLE Baseline: 28 Goal status: INITIAL  3.  Patient will achieve at least 55 on FOTO to demonstrate improved functional mobility Baseline: 48 Goal status: INITIAL  4.  Patient will perform over elevation of R shoulder with pain < 3/10 Baseline: 5/10 Goal status: ongoing  5.  Increase R shoulder strength to at least 4/5 with pain < 3/10 Baseline: (3+)-4-/5 pain 5/10 Goal status: ongoing  6.  Patient will ambulate at least 400' with LRAD, MI, normalized gait pattern. Baseline: Clinic distances with RW, effortful, antalgic, increased lateral sway, slow speed. Goal status: INITIAL   PLAN: PT FREQUENCY: 2x/week  PT DURATION: 12 weeks  PLANNED INTERVENTIONS: Therapeutic exercises, Therapeutic activity, Neuromuscular re-education, Balance training, Gait training, Patient/Family education, Self Care, Joint mobilization, Dry Needling, Cryotherapy, Moist heat, Vasopneumatic device, Ionotophoresis 61m/ml Dexamethasone, and Manual therapy  PLAN FOR NEXT SESSION: Strengthening to trunk and LE, balance.  Patient Details  Name: Lisa MAGNERMRN: [id number] of Birth: 102/22/1962Referring Provider:  HCollene Leyden MD  Encounter Date: 04/02/2022   SMarcelina Morel DPT 04/02/2022, 10:53 AM  [address] [place] [address]Phone: [phone]  Fax:  [phone]

## 2022-04-03 ENCOUNTER — Other Ambulatory Visit (HOSPITAL_BASED_OUTPATIENT_CLINIC_OR_DEPARTMENT_OTHER): Payer: Self-pay

## 2022-04-03 ENCOUNTER — Ambulatory Visit (HOSPITAL_BASED_OUTPATIENT_CLINIC_OR_DEPARTMENT_OTHER): Payer: Commercial Managed Care - HMO

## 2022-04-07 ENCOUNTER — Encounter: Payer: Self-pay | Admitting: Physical Therapy

## 2022-04-07 ENCOUNTER — Ambulatory Visit: Payer: Commercial Managed Care - HMO | Admitting: Physical Therapy

## 2022-04-07 DIAGNOSIS — R293 Abnormal posture: Secondary | ICD-10-CM | POA: Diagnosis not present

## 2022-04-07 DIAGNOSIS — G8929 Other chronic pain: Secondary | ICD-10-CM

## 2022-04-07 DIAGNOSIS — M6281 Muscle weakness (generalized): Secondary | ICD-10-CM

## 2022-04-07 DIAGNOSIS — R262 Difficulty in walking, not elsewhere classified: Secondary | ICD-10-CM

## 2022-04-07 DIAGNOSIS — R2681 Unsteadiness on feet: Secondary | ICD-10-CM

## 2022-04-07 NOTE — Therapy (Signed)
OUTPATIENT PHYSICAL THERAPY SHOULDER TREATMENT   Patient Name: Lisa [name] MRN: [id number] DOB:09-10-1960, 61 y.o., female Today's Date: 04/07/2022   PT End of Session - 04/07/22 1015     Visit Number 7    Date for PT Re-Evaluation 06/09/22    PT Start Time 1011    PT Stop Time 1050    PT Time Calculation (min) 39 min    Activity Tolerance Patient tolerated treatment well    Behavior During Therapy [facility] for tasks assessed/performed                  Past Medical History:  Diagnosis Date   Anxiety    Arthritis    Breast cancer ([place]) 06/28/21   lump found   Breast cancer ([place])    left breast [facility]   Breast hematoma 12/02/2021   Breast mass, left 07/10/2021   Bulging disc L-5   Chicken pox    Depression    Diabetes mellitus    Hyperlipidemia    Hypertension    Insomnia    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    Meningitis 06/11/2021   bacterial meningitis   Polycystic ovarian disease    Past Surgical History:  Procedure Laterality Date   BREAST LUMPECTOMY Left 08/2021   BREAST LUMPECTOMY WITH SENTINEL LYMPH NODE BIOPSY Left 08/23/2021   Procedure: LEFT BREAST LUMPECTOMY WITH SENTINEL LYMPH NODE BIOPSY;  Surgeon: Stark Klein, MD;  Location: [place];  Service: General;  Laterality: Left;   csf leak in skull  06/10/2003   noted failed attempt to close CSF leak in skull   DILATION AND EVACUATION  06/09/1988   miscarriage   IR FLUORO GUIDED NEEDLE PLC ASPIRATION/INJECTION LOC  06/12/2021   RADIOLOGY WITH ANESTHESIA N/A 06/12/2021   Procedure: IR WITH ANESTHESIA;  Surgeon: Radiologist, Medication, MD;  Location: [place];  Service: Radiology;  Laterality: N/A;   TONSILLECTOMY AND ADENOIDECTOMY  06/10/1971   Patient Active Problem List   Diagnosis Date Noted   Hyperglycemia due to type 2 diabetes mellitus ([place]) 01/21/2022   Degeneration of lumbar intervertebral disc 01/21/2022   Breast hematoma 12/02/2021    Genetic testing 08/22/2021   Family history of breast cancer 08/14/2021   Malignant neoplasm of upper-outer quadrant of left breast in female, estrogen receptor positive ([place]) 08/12/2021   Breast mass, left 07/10/2021   Fever    Bacterial meningitis    Right sided weakness    Pressure injury of skin 06/13/2021   Sepsis due to Haemophilus influenzae with acute hypoxic respiratory failure and septic shock ([place])    Acute bacterial meningitis 19/41/7408   Acute metabolic encephalopathy 14/48/1856   Altered mental status 06/12/2021   Obesity, morbid, BMI 40.0-49.9 ([place]) 03/24/2016   Family history of breast cancer in sister 10/21/2013   Tick bite of back 10/21/2012   Screening for malignant neoplasm of the cervix 05/19/2012   Routine gynecological examination 05/19/2012   Tobacco use 12/30/2011   Diabetes mellitus, type 2 ([place]) 12/29/2011   HTN (hypertension) 12/29/2011   Hyperlipidemia 12/29/2011   CSF leak 12/29/2011   Psoriasis 12/29/2011   PCOS (polycystic ovarian syndrome) 12/29/2011   Depression with anxiety 12/29/2011    PCP: Collene Leyden  REFERRING PROVIDER: Inez Catalina, MD   REFERRING DIAG: [id number] (ICD-10-CM) - Strain of muscle(s) and tendon(s) of the rotator cuff of right shoulder, initial encounter   THERAPY DIAG:  Difficulty in walking, not elsewhere classified  Muscle  weakness (generalized)  Chronic pain of right knee  Unsteadiness on feet  Abnormal posture  Rationale for Evaluation and Treatment Rehabilitation  ONSET DATE: 02/12/2022   SUBJECTIVE:                                                                                                                                                                                      SUBJECTIVE STATEMENT: Patient reports increased pain in her R medial knee today, impedes walking.   PERTINENT HISTORY: Patient with high grade partial-thickness tear to R supraspinatus muscle. Identified in Jan 2023, but she  developed breast cancer, so her shoulder treatment was put on hold. She was just recently being followed for Lymphedma PT  Chronic B knee pain, R > L. Received corticosteroid injection in R knee 02/12/22. Chronic back pain has resolved.   PAIN:  Are you having pain? Yes: NPRS scale: 7/10 Pain location: R arm, R knee, Pain description: arm pain-tingling, burning in am, R knee starts with no pain, increases during the day. Aggravating factors: Walking as her knee pain increases, she uses her UE more on walker. Relieving factors: Medication, movement in R arm  PRECAUTIONS: None  WEIGHT BEARING RESTRICTIONS No  FALLS:  Has patient fallen in last 6 months? Yes. Number of falls 1 She was sweeping and felt a pinch and pain shot down her leg, causing her to fall.  LIVING ENVIRONMENT: Lives with: lives with their spouse Lives in: House/apartment Stairs: Yes: Internal: 12 steps; can reach both and External: 1 steps; none, able to live on main level Has following equipment at home: Gilford Rile - 2 wheeled, Environmental consultant - 4 wheeled, Manufacturing engineer  OCCUPATION: N/A  PLOF: Independent  PATIENT GOALS Improve mobility, decrease pain  OBJECTIVE:   DIAGNOSTIC FINDINGS:  Previous MRI showed L4-L5 disc herniation with mass effect at L5 nerve root  PATIENT SURVEYS:  FOTO 48.4  COGNITION:  Overall cognitive status: Within functional limits for tasks assessed     SENSATION: N/T  POSTURE: Round shoulders, forward head, flexed posture.  UPPER EXTREMITY ROM: All R shoulder movement is painful. LUE ROM WFL  Active ROM Right eval Left eval  Shoulder flexion 138   Shoulder extension WNL   Shoulder abduction [facility]   Shoulder adduction    Shoulder internal rotation [facility]   Shoulder external rotation [facility]   Elbow flexion    Elbow extension    Wrist flexion    Wrist extension    Wrist ulnar deviation    Wrist radial deviation    Wrist pronation    Wrist supination    (Blank rows = not  tested)  UPPER EXTREMITY MMT: all  R shoulder movements painful, LUE WFL  MMT Right eval Left eval  Shoulder flexion 3+   Shoulder extension 4-   Shoulder abduction 3+   Shoulder adduction    Shoulder internal rotation 3+   Shoulder external rotation 3+   Middle trapezius    Lower trapezius    Elbow flexion 4   Elbow extension 4   Wrist flexion    Wrist extension    Wrist ulnar deviation    Wrist radial deviation    Wrist pronation    Wrist supination    Grip strength (lbs)    (Blank rows = not tested)  SHOULDER SPECIAL TESTS:  Impingement tests: Hawkins/Kennedy impingement test: positive    Rotator cuff assessment: Empty can test: positive  and Belly press test: negative    JOINT MOBILITY TESTING:  B shoulder inferior and posterior glide limited, moderate.   FUNCTIONAL ASSESSMENT   5X STS-27.97  PALPATION:  TTP over supraspinatus muscle and tendon, tension noted in B up traps.   TODAY'S TREATMENT:  04/07/22 NuStep L3 with BU and LE, seat pushed back slightly due to R knee pain. Seated postural control- OHP with 2# ball x 10     -diagonals to each side with 2# ball, 10 reps each -Seated march while holding 2# ball and maintaining upright posture. 10 reps each leg. Vaso to R knee at 34 degrees, low pressure, x 10 minutes for pain relief Ionto applied to R medial knee for pain relief, 1.2 ml Dexamethasone  04/02/22 Recumbent bike L3.5 x 6 minutes Supine lower trunk rotation x 5 to each side. Supine stretch with strap-HS, ABD, ADD 3 x 10 sec each, each leg Supine bridge over ball with abdominal stabilization x 10, repeat with B hip IR, roll knees to chest on ball x 10, crunch with hands on ball in lap x 10 reps Seated clamshells against G tband x 10 reps Standing hip abd against G tband at knees x 10 reps. Seated shoulder flex and ext with 2# weights, 10 reps each. Abd, tricep ext, ER with 1# weights, 10 Scap retraction against G tband resistance x  10  03/31/22 NuStep L5 x 6 minutes Sit to stand holding 2# WATE bar, with OHP x 10 Repeat with chest press x 10 Standing hip abd against G tband at knees, 2 x 10 reps each leg  Seated knee flexion 15#, BLE, 2 x 10 reps Seated knee extension 5#, BLE 2 x 10 reps Paloff press, 1 x 10 reps on each side with Red Tband, 10 reps with Yellow Tband  03/28/22 Nustep L 5 47mn Red tband HS curl 2 sets 10 3# LAQ 2 sets 10 Red tband seated row, shld ext and ER 2 sets 10 3# hip flex,ext and abd. Marching 2 sets 10 with RW 3# cane ex chest press, flex, OH and abd 10 x   03/26/22 NuStep L5 x637ms  Shoulder rows and ext greenTB 2x10  ER with scap retraction redTB 2x10 Shoulder flexion 2# 2x10 Shoulder abd 2# 2x10  LAQ 3# 2x10 each side 3# hip abduction 2x10 each side  Standing HS curls 2x10 each side  Ball squeezes 2x10  03/20/22 NuStep L5 x 52m14m  LAQ 2# 2x10  Marching in RW 2#  HS curls greenTB 2x10 STS from elevated mat table 2x10 Shoulder flexion 3# WaTe 2x10 ER with scapular retraction 2x10     PATIENT EDUCATION: Education details: POC, HEP Person educated: Patient Education method: Explanation, [employer] plannernd Handouts Education comprehension:  verbalized understanding   HOME EXERCISE PROGRAM: [id number]  ASSESSMENT:  CLINICAL IMPRESSION: Patient reports that her R knee is very sore today. She is walking with increased antalgic gait.Treatment modified to accommodate and address her acute/chronic  pain.  OBJECTIVE IMPAIRMENTS Abnormal gait, decreased activity tolerance, decreased balance, decreased coordination, decreased endurance, decreased mobility, difficulty walking, decreased ROM, decreased strength, impaired UE functional use, improper body mechanics, postural dysfunction, obesity, and pain.   ACTIVITY LIMITATIONS carrying, lifting, bending, sitting, standing, squatting, sleeping, stairs, transfers, reach over head, and locomotion level  PARTICIPATION  LIMITATIONS: meal prep, cleaning, laundry, community activity, and yard work  PERSONAL FACTORS Behavior pattern, Past/current experiences, and 1-2 comorbidities: breast cancer, meningitis, DM  are also affecting patient's functional outcome.   REHAB POTENTIAL: Good  CLINICAL DECISION MAKING: Stable/uncomplicated  EVALUATION COMPLEXITY: Moderate   GOALS: Goals reviewed with patient? Yes  SHORT TERM GOALS: Target date: 04/14/2022(Remove Blue Hyperlink)  I with basic HEP Baseline: Goal status: met 03/28/22  LONG TERM GOALS: Target date: 06/09/2022  (Remove Blue Hyperlink)  I with final HEP Baseline:  Goal status: ongoing  2.  Patient will complete 5 x STS in < 14 sec to demonstrate improved strength in BLE Baseline: 28 Goal status: INITIAL  3.  Patient will achieve at least 55 on FOTO to demonstrate improved functional mobility Baseline: 48 Goal status: INITIAL  4.  Patient will perform over elevation of R shoulder with pain < 3/10 Baseline: 5/10 Goal status: ongoing  5.  Increase R shoulder strength to at least 4/5 with pain < 3/10 Baseline: (3+)-4-/5 pain 5/10 Goal status: ongoing  6.  Patient will ambulate at least 400' with LRAD, MI, normalized gait pattern. Baseline: Clinic distances with RW, effortful, antalgic, increased lateral sway, slow speed. Goal status: INITIAL   PLAN: PT FREQUENCY: 2x/week  PT DURATION: 12 weeks  PLANNED INTERVENTIONS: Therapeutic exercises, Therapeutic activity, Neuromuscular re-education, Balance training, Gait training, Patient/Family education, Self Care, Joint mobilization, Dry Needling, Cryotherapy, Moist heat, Vasopneumatic device, Ionotophoresis 65m/ml Dexamethasone, and Manual therapy  PLAN FOR NEXT SESSION: Strengthening to trunk and LE, balance. How is the R knee feeling?  Patient Details  Name: Lisa TARNOWMRN: [id number] of Birth: 105-Dec-1962Referring Provider:  HCollene Leyden MD  Encounter Date:  04/07/2022   SMarcelina Morel DPT 04/07/2022, 10:46 AM  [place] [address]Phone: [phone]  Fax:  [phone]

## 2022-04-08 NOTE — Therapy (Signed)
OUTPATIENT PHYSICAL THERAPY SHOULDER TREATMENT   Patient Name: Lisa [name] MRN: [id number] DOB:1960-07-08, 61 y.o., female Today's Date: 04/09/2022   PT End of Session - 04/09/22 1006     Visit Number 8    Date for PT Re-Evaluation 06/09/22    PT Start Time 1007    PT Stop Time 1050    PT Time Calculation (min) 43 min    Activity Tolerance Patient tolerated treatment well    Behavior During Therapy [facility] for tasks assessed/performed                   Past Medical History:  Diagnosis Date   Anxiety    Arthritis    Breast cancer ([place]) 06/28/21   lump found   Breast cancer ([place])    left breast [facility]   Breast hematoma 12/02/2021   Breast mass, left 07/10/2021   Bulging disc L-5   Chicken pox    Depression    Diabetes mellitus    Hyperlipidemia    Hypertension    Insomnia    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    Meningitis 06/11/2021   bacterial meningitis   Polycystic ovarian disease    Past Surgical History:  Procedure Laterality Date   BREAST LUMPECTOMY Left 08/2021   BREAST LUMPECTOMY WITH SENTINEL LYMPH NODE BIOPSY Left 08/23/2021   Procedure: LEFT BREAST LUMPECTOMY WITH SENTINEL LYMPH NODE BIOPSY;  Surgeon: Stark Klein, MD;  Location: [place];  Service: General;  Laterality: Left;   csf leak in skull  06/10/2003   noted failed attempt to close CSF leak in skull   DILATION AND EVACUATION  06/09/1988   miscarriage   IR FLUORO GUIDED NEEDLE PLC ASPIRATION/INJECTION LOC  06/12/2021   RADIOLOGY WITH ANESTHESIA N/A 06/12/2021   Procedure: IR WITH ANESTHESIA;  Surgeon: Radiologist, Medication, MD;  Location: [place];  Service: Radiology;  Laterality: N/A;   TONSILLECTOMY AND ADENOIDECTOMY  06/10/1971   Patient Active Problem List   Diagnosis Date Noted   Hyperglycemia due to type 2 diabetes mellitus ([place]) 01/21/2022   Degeneration of lumbar intervertebral disc 01/21/2022   Breast hematoma 12/02/2021    Genetic testing 08/22/2021   Family history of breast cancer 08/14/2021   Malignant neoplasm of upper-outer quadrant of left breast in female, estrogen receptor positive ([place]) 08/12/2021   Breast mass, left 07/10/2021   Fever    Bacterial meningitis    Right sided weakness    Pressure injury of skin 06/13/2021   Sepsis due to Haemophilus influenzae with acute hypoxic respiratory failure and septic shock ([place])    Acute bacterial meningitis 18/56/3149   Acute metabolic encephalopathy 70/26/3785   Altered mental status 06/12/2021   Obesity, morbid, BMI 40.0-49.9 ([place]) 03/24/2016   Family history of breast cancer in sister 10/21/2013   Tick bite of back 10/21/2012   Screening for malignant neoplasm of the cervix 05/19/2012   Routine gynecological examination 05/19/2012   Tobacco use 12/30/2011   Diabetes mellitus, type 2 ([place]) 12/29/2011   HTN (hypertension) 12/29/2011   Hyperlipidemia 12/29/2011   CSF leak 12/29/2011   Psoriasis 12/29/2011   PCOS (polycystic ovarian syndrome) 12/29/2011   Depression with anxiety 12/29/2011    PCP: Collene Leyden  REFERRING PROVIDER: Inez Catalina, MD   REFERRING DIAG: [phone] (ICD-10-CM) - Strain of muscle(s) and tendon(s) of the rotator cuff of right shoulder, initial encounter   THERAPY DIAG:  Difficulty in walking, not elsewhere classified  Muscle weakness (generalized)  Chronic pain of right knee  Unsteadiness on feet  Abnormal posture  Rationale for Evaluation and Treatment Rehabilitation  ONSET DATE: 02/12/2022   SUBJECTIVE:                                                                                                                                                                                      SUBJECTIVE STATEMENT: The knee and back are okay today, it is a dull pain.   PERTINENT HISTORY: Patient with high grade partial-thickness tear to R supraspinatus muscle. Identified in Jan 2023, but she developed breast cancer,  so her shoulder treatment was put on hold. She was just recently being followed for Lymphedma PT  Chronic B knee pain, R > L. Received corticosteroid injection in R knee 02/12/22. Chronic back pain has resolved.   PAIN:  Are you having pain? Yes: NPRS scale: 7/10 Pain location: R arm, R knee, Pain description: arm pain-tingling, burning in am, R knee starts with no pain, increases during the day. Aggravating factors: Walking as her knee pain increases, she uses her UE more on walker. Relieving factors: Medication, movement in R arm  PRECAUTIONS: None  WEIGHT BEARING RESTRICTIONS No  FALLS:  Has patient fallen in last 6 months? Yes. Number of falls 1 She was sweeping and felt a pinch and pain shot down her leg, causing her to fall.  LIVING ENVIRONMENT: Lives with: lives with their spouse Lives in: House/apartment Stairs: Yes: Internal: 12 steps; can reach both and External: 1 steps; none, able to live on main level Has following equipment at home: Gilford Rile - 2 wheeled, Environmental consultant - 4 wheeled, Manufacturing engineer  OCCUPATION: N/A  PLOF: Independent  PATIENT GOALS Improve mobility, decrease pain  OBJECTIVE:   DIAGNOSTIC FINDINGS:  Previous MRI showed L4-L5 disc herniation with mass effect at L5 nerve root  PATIENT SURVEYS:  FOTO 48.4  COGNITION:  Overall cognitive status: Within functional limits for tasks assessed     SENSATION: N/T  POSTURE: Round shoulders, forward head, flexed posture.  UPPER EXTREMITY ROM: All R shoulder movement is painful. LUE ROM WFL  Active ROM Right eval Left eval  Shoulder flexion 138   Shoulder extension WNL   Shoulder abduction [facility]   Shoulder adduction    Shoulder internal rotation [facility]   Shoulder external rotation [facility]   Elbow flexion    Elbow extension    Wrist flexion    Wrist extension    Wrist ulnar deviation    Wrist radial deviation    Wrist pronation    Wrist supination    (Blank rows = not tested)  UPPER EXTREMITY MMT: all  R  shoulder movements painful, LUE WFL  MMT Right eval Left eval  Shoulder flexion 3+   Shoulder extension 4-   Shoulder abduction 3+   Shoulder adduction    Shoulder internal rotation 3+   Shoulder external rotation 3+   Middle trapezius    Lower trapezius    Elbow flexion 4   Elbow extension 4   Wrist flexion    Wrist extension    Wrist ulnar deviation    Wrist radial deviation    Wrist pronation    Wrist supination    Grip strength (lbs)    (Blank rows = not tested)  SHOULDER SPECIAL TESTS:  Impingement tests: Hawkins/Kennedy impingement test: positive    Rotator cuff assessment: Empty can test: positive  and Belly press test: negative    JOINT MOBILITY TESTING:  B shoulder inferior and posterior glide limited, moderate.   FUNCTIONAL ASSESSMENT   5X STS-27.97  PALPATION:  TTP over supraspinatus muscle and tendon, tension noted in B up traps.   TODAY'S TREATMENT:  04/09/22 NuStep L4 x36mns Standing march holding WaTE 2# 2x5 CGA due to unsteadiness when Wbing on RLE S2S 2x10  Rows and ext with Gband 2x10 ER with retraction 2x10 green Standing shoulder flexion 3# WaTE 2x10 Step up on airex in RW Lateral band walks with UE support on table   04/07/22 NuStep L3 with BU and LE, seat pushed back slightly due to R knee pain. Seated postural control- OHP with 2# ball x 10 -diagonals to each side with 2# ball, 10 reps each -Seated march while holding 2# ball and maintaining upright posture. 10 reps each leg. Vaso to R knee at 34 degrees, low pressure, x 10 minutes for pain relief Ionto applied to R medial knee for pain relief, 1.2 ml Dexamethasone  04/02/22 Recumbent bike L3.5 x 6 minutes Supine lower trunk rotation x 5 to each side. Supine stretch with strap-HS, ABD, ADD 3 x 10 sec each, each leg Supine bridge over ball with abdominal stabilization x 10, repeat with B hip IR, roll knees to chest on ball x 10, crunch with hands on ball in lap x 10 reps Seated  clamshells against G tband x 10 reps Standing hip abd against G tband at knees x 10 reps. Seated shoulder flex and ext with 2# weights, 10 reps each. Abd, tricep ext, ER with 1# weights, 10 Scap retraction against G tband resistance x 10  03/31/22 NuStep L5 x 6 minutes Sit to stand holding 2# WATE bar, with OHP x 10 Repeat with chest press x 10 Standing hip abd against G tband at knees, 2 x 10 reps each leg  Seated knee flexion 15#, BLE, 2 x 10 reps Seated knee extension 5#, BLE 2 x 10 reps Paloff press, 1 x 10 reps on each side with Red Tband, 10 reps with Yellow Tband  03/28/22 Nustep L 5 618m Red tband HS curl 2 sets 10 3# LAQ 2 sets 10 Red tband seated row, shld ext and ER 2 sets 10 3# hip flex,ext and abd. Marching 2 sets 10 with RW 3# cane ex chest press, flex, OH and abd 10 x   03/26/22 NuStep L5 x6m35m  Shoulder rows and ext greenTB 2x10  ER with scap retraction redTB 2x10 Shoulder flexion 2# 2x10 Shoulder abd 2# 2x10  LAQ 3# 2x10 each side 3# hip abduction 2x10 each side  Standing HS curls 2x10 each side  Ball squeezes 2x10  03/20/22 NuStep L5 x 5mi29m  LAQ 2# 2x10  Marching in RW 2#  HS curls greenTB 2x10 STS from elevated mat table 2x10 Shoulder flexion 3# WaTe 2x10 ER with scapular retraction 2x10     PATIENT EDUCATION: Education details: POC, HEP Person educated: Patient Education method: Consulting civil engineer, Media planner, and Handouts Education comprehension: verbalized understanding   HOME EXERCISE PROGRAM: [id number]  ASSESSMENT:  CLINICAL IMPRESSION: Patient reports with decreased pain in back and knee. We worked on LE and UE strengthening today as tolerated. Difficulty with marching and step ups due to pain with Wbing on RLE.   OBJECTIVE IMPAIRMENTS Abnormal gait, decreased activity tolerance, decreased balance, decreased coordination, decreased endurance, decreased mobility, difficulty walking, decreased ROM, decreased strength, impaired UE  functional use, improper body mechanics, postural dysfunction, obesity, and pain.   ACTIVITY LIMITATIONS carrying, lifting, bending, sitting, standing, squatting, sleeping, stairs, transfers, reach over head, and locomotion level  PARTICIPATION LIMITATIONS: meal prep, cleaning, laundry, community activity, and yard work  PERSONAL FACTORS Behavior pattern, Past/current experiences, and 1-2 comorbidities: breast cancer, meningitis, DM  are also affecting patient's functional outcome.   REHAB POTENTIAL: Good  CLINICAL DECISION MAKING: Stable/uncomplicated  EVALUATION COMPLEXITY: Moderate   GOALS: Goals reviewed with patient? Yes  SHORT TERM GOALS: Target date: 04/14/2022(Remove Blue Hyperlink)  I with basic HEP Baseline: Goal status: met 03/28/22  LONG TERM GOALS: Target date: 06/09/2022  (Remove Blue Hyperlink)  I with final HEP Baseline:  Goal status: ongoing  2.  Patient will complete 5 x STS in < 14 sec to demonstrate improved strength in BLE Baseline: 28 Goal status: INITIAL  3.  Patient will achieve at least 55 on FOTO to demonstrate improved functional mobility Baseline: 48 Goal status: INITIAL  4.  Patient will perform over elevation of R shoulder with pain < 3/10 Baseline: 5/10 Goal status: ongoing  5.  Increase R shoulder strength to at least 4/5 with pain < 3/10 Baseline: (3+)-4-/5 pain 5/10 Goal status: ongoing  6.  Patient will ambulate at least 400' with LRAD, MI, normalized gait pattern. Baseline: Clinic distances with RW, effortful, antalgic, increased lateral sway, slow speed. Goal status: INITIAL   PLAN: PT FREQUENCY: 2x/week  PT DURATION: 12 weeks  PLANNED INTERVENTIONS: Therapeutic exercises, Therapeutic activity, Neuromuscular re-education, Balance training, Gait training, Patient/Family education, Self Care, Joint mobilization, Dry Needling, Cryotherapy, Moist heat, Vasopneumatic device, Ionotophoresis 59m/ml Dexamethasone, and Manual  therapy  PLAN FOR NEXT SESSION: Strengthening to trunk and LE, balance. How is the R knee feeling?  Patient Details  Name: Lisa KAWASAKIMRN: [id number] of Birth: 101/25/1962Referring Provider:  HCollene Leyden MD  Encounter Date: 04/09/2022   SMarcelina Morel DPT 04/09/2022, 10:49 AM  [place] [address]Phone: [phone]  Fax:  [phone]

## 2022-04-09 ENCOUNTER — Other Ambulatory Visit (HOSPITAL_BASED_OUTPATIENT_CLINIC_OR_DEPARTMENT_OTHER): Payer: Self-pay

## 2022-04-09 ENCOUNTER — Ambulatory Visit: Payer: Commercial Managed Care - HMO | Attending: Sports Medicine

## 2022-04-09 DIAGNOSIS — R2681 Unsteadiness on feet: Secondary | ICD-10-CM | POA: Diagnosis present

## 2022-04-09 DIAGNOSIS — R293 Abnormal posture: Secondary | ICD-10-CM | POA: Diagnosis present

## 2022-04-09 DIAGNOSIS — M6281 Muscle weakness (generalized): Secondary | ICD-10-CM | POA: Insufficient documentation

## 2022-04-09 DIAGNOSIS — G8929 Other chronic pain: Secondary | ICD-10-CM | POA: Insufficient documentation

## 2022-04-09 DIAGNOSIS — R262 Difficulty in walking, not elsewhere classified: Secondary | ICD-10-CM | POA: Insufficient documentation

## 2022-04-09 DIAGNOSIS — M25561 Pain in right knee: Secondary | ICD-10-CM | POA: Diagnosis present

## 2022-04-14 NOTE — Therapy (Signed)
OUTPATIENT PHYSICAL THERAPY ONCOLOGY EVALUATION  Patient Name: Lisa [name] MRN: [id number] DOB:1961/05/01, 61 y.o., female Today's Date: 04/15/2022   PT End of Session - 04/15/22 0953     Visit Number 1    Number of Visits 6    Date for PT Re-Evaluation 05/27/22    Authorization - Visit Number 1    Authorization - Number of Visits 6    Progress Note Due on Visit 6   insurance runs out on visit 6   PT Start Time [phone]    PT Stop Time 1045    PT Time Calculation (min) 49 min    Activity Tolerance Patient tolerated treatment well    Behavior During Therapy [facility] for tasks assessed/performed             Past Medical History:  Diagnosis Date   Anxiety    Arthritis    Breast cancer ([place]) 06/28/21   lump found   Breast cancer ([place])    left breast [facility]   Breast hematoma 12/02/2021   Breast mass, left 07/10/2021   Bulging disc L-5   Chicken pox    Depression    Diabetes mellitus    Hyperlipidemia    Hypertension    Insomnia    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    Meningitis 06/11/2021   bacterial meningitis   Polycystic ovarian disease    Past Surgical History:  Procedure Laterality Date   BREAST LUMPECTOMY Left 08/2021   BREAST LUMPECTOMY WITH SENTINEL LYMPH NODE BIOPSY Left 08/23/2021   Procedure: LEFT BREAST LUMPECTOMY WITH SENTINEL LYMPH NODE BIOPSY;  Surgeon: Stark Klein, MD;  Location: [place];  Service: General;  Laterality: Left;   csf leak in skull  06/10/2003   noted failed attempt to close CSF leak in skull   DILATION AND EVACUATION  06/09/1988   miscarriage   IR FLUORO GUIDED NEEDLE PLC ASPIRATION/INJECTION LOC  06/12/2021   RADIOLOGY WITH ANESTHESIA N/A 06/12/2021   Procedure: IR WITH ANESTHESIA;  Surgeon: Radiologist, Medication, MD;  Location: [place];  Service: Radiology;  Laterality: N/A;   TONSILLECTOMY AND ADENOIDECTOMY  06/10/1971   Patient Active Problem List   Diagnosis Date Noted    Hyperglycemia due to type 2 diabetes mellitus ([place]) 01/21/2022   Degeneration of lumbar intervertebral disc 01/21/2022   Breast hematoma 12/02/2021   Genetic testing 08/22/2021   Family history of breast cancer 08/14/2021   Malignant neoplasm of upper-outer quadrant of left breast in female, estrogen receptor positive ([place]) 08/12/2021   Breast mass, left 07/10/2021   Fever    Bacterial meningitis    Right sided weakness    Pressure injury of skin 06/13/2021   Sepsis due to Haemophilus influenzae with acute hypoxic respiratory failure and septic shock ([place])    Acute bacterial meningitis 50/93/2671   Acute metabolic encephalopathy 24/58/0998   Altered mental status 06/12/2021   Obesity, morbid, BMI 40.0-49.9 ([place]) 03/24/2016   Family history of breast cancer in sister 10/21/2013   Tick bite of back 10/21/2012   Screening for malignant neoplasm of the cervix 05/19/2012   Routine gynecological examination 05/19/2012   Tobacco use 12/30/2011   Diabetes mellitus, type 2 ([place]) 12/29/2011   HTN (hypertension) 12/29/2011   Hyperlipidemia 12/29/2011   CSF leak 12/29/2011   Psoriasis 12/29/2011   PCOS (polycystic ovarian syndrome) 12/29/2011   Depression with anxiety 12/29/2011    PCP: Collene Leyden, MD  REFERRING PROVIDER: Stark Klein  MD  REFERRING DIAG: Left Breast Pain  THERAPY DIAG:  Malignant neoplasm of upper-outer quadrant of left breast in female, estrogen receptor positive ([place])  Lymphedema, not elsewhere classified  Pain of left breast  ONSET DATE: 08/23/2021  Rationale for Evaluation and Treatment: Rehabilitation  SUBJECTIVE:                                                                                                                                                                                           SUBJECTIVE STATEMENT: Pt has been having some drainage from the left nipple, but was seen by MD who feels it is from the seroma.  It is not draining when she  performs self MLD, or uses her flexi touch.  She only notices the drainage when she manipulates her breast by pressing on it. It is serosanguinous.The flexitouch does make it feel softer, but she is concerned because the Flexi feels very strong in the upper arm so she called [employer] and they are coming on Nov 14 to check it. She originally set it up herself when she received it.In the evening she develops more pain and she feels more swollen. It is softer when she first arises in the am. The compression bra is not comfortable for her. She is having trouble in both hands now that is shooting, sharp and makes Manual lymphatic drainage difficult. 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 She had negative margins and 1/5 positive nodes for a final path of pT1cN1a. She received adjuvant radiation. She was going to start anastrozole, but didn't. She was seen on 09/06/2021 with pain and swelling in the axilla, thought to have a possible seroma in the axilla did not attempt aspiration due to history of bacterial meningitis and possible risk of infection. DM, depression, HTN, obesity, arthritis She returned to MD 03/24/2022 with recurrent nipple drainage.  She had  PT for lymphedema and she got a flexi touch. She is doing manual lymph drainage once daily and the flexitouch once daily as well as wearing compression bra. She got mammogram and ultrasound that showed a 3.9 cm residual seroma that appeared to be non infected . She was advised to continue with compression bra and that fluid may come out until seroma is gone. Not a problem per MD as long as there is no infection.  PAIN:  Are you having pain? Yes NPRS scale: 4-9/10 Pain location: Left  Breast Pain orientation:  Left  PAIN TYPE: sharp Pain description: intermittent  Aggravating factors: When more swollen, PM Relieving factors: tramadol helps some  PRECAUTIONS: Left breast lymphedema, DM, HTN,OA, temporal bone malformation  WEIGHT BEARING RESTRICTIONS: [place] uses a walker for safety.  FALLS:  Has patient fallen in last 6 months? Yes. Number of falls 1-2  LIVING ENVIRONMENT: Lives with: husband, son, daughter daughter in law and 3 children Lives in: House/apartment Stairs: Yes; pt lives on first floor. Only has 1 step driveway to porch Has following equipment at home: Single point cane, Environmental consultant - 2 wheeled, Environmental consultant - 4 wheeled, Manufacturing engineer  OCCUPATION: unemployed  LEISURE: does not exercise, [facility] at [facility] for LE strengthening and right shoulder  HAND DOMINANCE: right   PRIOR LEVEL OF FUNCTION: Independent with community mobility with device  PATIENT GOALS: To find a way to decrease swelling/pain   OBJECTIVE:  COGNITION: Overall cognitive status: Within functional limits for tasks assessed   PALPATION: Fibrotic especially medial breast, with tenderness and at incision area. Mild swelling noted at lateral trunk, tender at left axillary border of pectoras  OBSERVATIONS / OTHER ASSESSMENTS: significant left nipple swelling, enlarged pores, medial breast fibrosis, redness, heat  SENSATION: Light touch: Appears intact   POSTURE: forward head, rounded shoulders  UPPER EXTREMITY AROM/PROM:  A/PROM RIGHT   eval Has right shoulder pain  Shoulder extension   Shoulder flexion 130  Shoulder abduction 125 +  Shoulder internal rotation   Shoulder external rotation     (Blank rows = not tested)  A/PROM LEFT   eval  Shoulder extension   Shoulder flexion 147 + axilla  Shoulder abduction 147 + shoulder  Shoulder internal rotation   Shoulder external rotation     (Blank rows = not tested)  CERVICAL AROM: All within normal limits:     UPPER EXTREMITY STRENGTH:  WFL6.6  LYMPHEDEMA ASSESSMENTS:   SURGERY TYPE/DATE: L lumpectomy 08/23/21   NUMBER OF LYMPH NODES REMOVED: 1/5  CHEMOTHERAPY: NO  RADIATION:completed 2023  HORMONE TREATMENT: NO  INFECTIONS: possible infection of seroma Left breast at end of March 2023  LYMPHEDEMA ASSESSMENTS:   LANDMARK RIGHT  eval  10 cm proximal to olecranon process 33.6  Olecranon process 27.7  10 cm proximal to ulnar styloid process 23.5  Just proximal to ulnar styloid process 17.7  Across hand at thumb web space 20.5  At base of 2nd digit 6.6  (Blank rows = not tested)  LANDMARK LEFT  eval  10 cm proximal to olecranon process 33.0  Olecranon process 27.0  10 cm proximal to ulnar styloid process 23.0  Just proximal to ulnar styloid process 17.9  Across hand at thumb web space 20.4  At base of 2nd digit 6.6  (Blank rows = not tested)     BREAST COMPLAINTS SURVEY  64  TODAY'S TREATMENT:  DATE: No treatment provided due to concerns of infection. Messaged MD and CMA and placed photo in media. Requested a follow up visit for pt prior to [place] beginning treatment   PATIENT EDUCATION:  Education details: pt. Person educated: Patient Education method: Explanation Education comprehension: verbalized understanding  HOME EXERCISE PROGRAM: None given  ASSESSMENT:  CLINICAL IMPRESSION: Patient is a 61 y.o. female who was seen today for physical therapy evaluation and treatment for complaints of left breast pain, swelling and nipple discharge. Pt with significant increase in swelling since she was last seen, especially in the nipple area and medial breast. There is redness surrounding the nipple, and warmth to touch. Significantly enlarged pores and increase in fibrosis especially medial breast. Photos taken and placed in Media and MD/CMA message to request a follow up  from them prior to our beginning treatment..   OBJECTIVE IMPAIRMENTS: increased edema and pain.   ACTIVITY LIMITATIONS: lifting, bed mobility, and reach over head  PARTICIPATION LIMITATIONS: able to do what is required of her but with pain  PERSONAL FACTORS: 3+ comorbidities: Left breast cancer s/p radiation, DM,Bacterial meningitis  are also affecting patient's functional outcome.   REHAB POTENTIAL: Good  CLINICAL DECISION MAKING: Stable/uncomplicated  EVALUATION COMPLEXITY: Low  GOALS: Goals reviewed with patient? Yes STG =LONG TERM GOALS: Target date: 05/27/2022     Pt will be independent in self MLD for long term management of lymphedema  Baseline:  Goal status: INITIAL  2.  Pt will report decreased left breast pain by 50% or more Baseline:  Goal status: INITIAL  3.  Breast complaints survey no greater than 18 points Baseline:  Goal status: INITIAL  4.  Decreased breast swelling/fibrosis improved by 50% Baseline:  Goal status: INITIAL  PLAN:  PT FREQUENCY: 1-2x/week  PT DURATION: 6 weeks  PLANNED INTERVENTIONS: Therapeutic exercises, Therapeutic activity, Patient/Family education, Self Care, Orthotic/Fit training, Manual lymph drainage, scar mobilization, and Manual therapy  PLAN FOR NEXT SESSION: See if pt was placed on antibiotics and hold rx until swelling/pain starts to subside, initiate MLD and review with pt,, STM axillary border of pectorals   Claris Pong, PT 04/15/2022, 10:56 AM

## 2022-04-15 ENCOUNTER — Other Ambulatory Visit: Payer: Self-pay | Admitting: General Surgery

## 2022-04-15 ENCOUNTER — Other Ambulatory Visit: Payer: Self-pay

## 2022-04-15 ENCOUNTER — Ambulatory Visit: Payer: Commercial Managed Care - HMO | Attending: General Surgery

## 2022-04-15 DIAGNOSIS — I890 Lymphedema, not elsewhere classified: Secondary | ICD-10-CM | POA: Diagnosis present

## 2022-04-15 DIAGNOSIS — N644 Mastodynia: Secondary | ICD-10-CM

## 2022-04-15 DIAGNOSIS — Z9889 Other specified postprocedural states: Secondary | ICD-10-CM

## 2022-04-15 DIAGNOSIS — C50412 Malignant neoplasm of upper-outer quadrant of left female breast: Secondary | ICD-10-CM | POA: Insufficient documentation

## 2022-04-15 DIAGNOSIS — Z170 Estrogen receptor positive status [ER+]: Secondary | ICD-10-CM | POA: Insufficient documentation

## 2022-04-16 ENCOUNTER — Other Ambulatory Visit (HOSPITAL_BASED_OUTPATIENT_CLINIC_OR_DEPARTMENT_OTHER): Payer: Self-pay

## 2022-04-16 ENCOUNTER — Ambulatory Visit: Payer: Commercial Managed Care - HMO | Admitting: Physical Therapy

## 2022-04-16 ENCOUNTER — Ambulatory Visit
Admission: RE | Admit: 2022-04-16 | Discharge: 2022-04-16 | Disposition: A | Discharge status: Home / Self Care | Payer: Commercial Managed Care - HMO | Source: Ambulatory Visit | Attending: Acute Care | Admitting: Acute Care

## 2022-04-16 DIAGNOSIS — F17210 Nicotine dependence, cigarettes, uncomplicated: Secondary | ICD-10-CM

## 2022-04-16 DIAGNOSIS — Z87891 Personal history of nicotine dependence: Secondary | ICD-10-CM

## 2022-04-16 DIAGNOSIS — Z122 Encounter for screening for malignant neoplasm of respiratory organs: Secondary | ICD-10-CM

## 2022-04-17 ENCOUNTER — Other Ambulatory Visit (HOSPITAL_BASED_OUTPATIENT_CLINIC_OR_DEPARTMENT_OTHER): Payer: Self-pay

## 2022-04-17 ENCOUNTER — Encounter: Payer: Self-pay | Admitting: Infectious Disease

## 2022-04-17 MED ORDER — ESZOPICLONE 3 MG PO TABS
3.0000 mg | ORAL_TABLET | Freq: Every day | ORAL | 0 refills | Status: DC
  Filled 2022-04-17: qty 30, 30d supply, fill #0

## 2022-04-18 ENCOUNTER — Other Ambulatory Visit: Payer: Self-pay

## 2022-04-18 ENCOUNTER — Ambulatory Visit
Admission: RE | Admit: 2022-04-18 | Discharge: 2022-04-18 | Disposition: A | Discharge status: Home / Self Care | Payer: Commercial Managed Care - HMO | Source: Ambulatory Visit | Attending: General Surgery | Admitting: General Surgery

## 2022-04-18 ENCOUNTER — Ambulatory Visit: Payer: Commercial Managed Care - HMO

## 2022-04-18 ENCOUNTER — Other Ambulatory Visit (HOSPITAL_BASED_OUTPATIENT_CLINIC_OR_DEPARTMENT_OTHER): Payer: Self-pay

## 2022-04-18 DIAGNOSIS — N644 Mastodynia: Secondary | ICD-10-CM

## 2022-04-18 DIAGNOSIS — Z9889 Other specified postprocedural states: Secondary | ICD-10-CM

## 2022-04-18 MED ORDER — AMOXICILLIN-POT CLAVULANATE 875-125 MG PO TABS
1.0000 | ORAL_TABLET | Freq: Two times a day (BID) | ORAL | 2 refills | Status: AC
  Filled 2022-04-18: qty 60, 30d supply, fill #0

## 2022-04-21 ENCOUNTER — Other Ambulatory Visit: Payer: Self-pay | Admitting: Acute Care

## 2022-04-21 DIAGNOSIS — Z122 Encounter for screening for malignant neoplasm of respiratory organs: Secondary | ICD-10-CM

## 2022-04-21 DIAGNOSIS — Z87891 Personal history of nicotine dependence: Secondary | ICD-10-CM

## 2022-04-21 DIAGNOSIS — F17210 Nicotine dependence, cigarettes, uncomplicated: Secondary | ICD-10-CM

## 2022-04-22 ENCOUNTER — Ambulatory Visit: Payer: Commercial Managed Care - HMO | Admitting: Physical Therapy

## 2022-04-22 ENCOUNTER — Other Ambulatory Visit: Payer: Self-pay

## 2022-04-22 ENCOUNTER — Ambulatory Visit (INDEPENDENT_AMBULATORY_CARE_PROVIDER_SITE_OTHER): Payer: Commercial Managed Care - HMO | Admitting: Infectious Disease

## 2022-04-22 ENCOUNTER — Ambulatory Visit (INDEPENDENT_AMBULATORY_CARE_PROVIDER_SITE_OTHER): Payer: Commercial Managed Care - HMO

## 2022-04-22 ENCOUNTER — Encounter: Payer: Self-pay | Admitting: Infectious Disease

## 2022-04-22 VITALS — BP 121/77 | HR 83 | Temp 97.0°F | Ht 66.0 in | Wt 266.0 lb

## 2022-04-22 DIAGNOSIS — J9601 Acute respiratory failure with hypoxia: Secondary | ICD-10-CM

## 2022-04-22 DIAGNOSIS — G9600 Cerebrospinal fluid leak, unspecified: Secondary | ICD-10-CM

## 2022-04-22 DIAGNOSIS — Z23 Encounter for immunization: Secondary | ICD-10-CM | POA: Diagnosis not present

## 2022-04-22 DIAGNOSIS — G009 Bacterial meningitis, unspecified: Secondary | ICD-10-CM | POA: Diagnosis not present

## 2022-04-22 DIAGNOSIS — Z7185 Encounter for immunization safety counseling: Secondary | ICD-10-CM

## 2022-04-22 DIAGNOSIS — H6690 Otitis media, unspecified, unspecified ear: Secondary | ICD-10-CM | POA: Insufficient documentation

## 2022-04-22 DIAGNOSIS — A413 Sepsis due to Hemophilus influenzae: Secondary | ICD-10-CM

## 2022-04-22 DIAGNOSIS — Z170 Estrogen receptor positive status [ER+]: Secondary | ICD-10-CM

## 2022-04-22 DIAGNOSIS — N6489 Other specified disorders of breast: Secondary | ICD-10-CM | POA: Insufficient documentation

## 2022-04-22 DIAGNOSIS — C50412 Malignant neoplasm of upper-outer quadrant of left female breast: Secondary | ICD-10-CM | POA: Diagnosis not present

## 2022-04-22 DIAGNOSIS — Z8659 Personal history of other mental and behavioral disorders: Secondary | ICD-10-CM

## 2022-04-22 DIAGNOSIS — R6521 Severe sepsis with septic shock: Secondary | ICD-10-CM

## 2022-04-22 DIAGNOSIS — R911 Solitary pulmonary nodule: Secondary | ICD-10-CM

## 2022-04-22 HISTORY — DX: Otitis media, unspecified, unspecified ear: H66.90

## 2022-04-22 HISTORY — DX: Encounter for immunization safety counseling: Z71.85

## 2022-04-22 HISTORY — DX: Solitary pulmonary nodule: R91.1

## 2022-04-22 NOTE — Progress Notes (Signed)
Subjective:  Chief complaints: irritation of skin of breast where there is a seroma, ears feeling full CSF leakage and some depressive symptoms   : 1960-11-21, 61 y.o.   MRN: [id number]  HPI  61 y.o. 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 I had concerns for possible brain abscess developing due to amount of purulence on CSF LP. She fortunately did not on repeat MRI. She completed 14 days of high dose ceftriaxone and flagyl.  While she was in the hospital she has noticed a mass in her left breast and indeed on exam did have homogeneous nontender mass in the left breast.  Ultimately she was diagnosed with breast cancer and underwent surgery with Dr. Barry Dienes with lumpectomy and axillary lymph node dissection.  She did not want to go on tamoxifen or alternative treatment to that.  She did finish radiation.  Her course was complicated by a fluid collection that developed in her breast and with drainage of milky and then more bloody material from the nipple.  She had Augmentin at home which I given her to have on hand in case she had a sinus infection to prevent further episodes of bacterial meningitis and she began this.  She was then seen in the office by the PA at [facility] cryosurgery who started doxycycline she then seen in follow-up by Dr. Barry Dienes who aspirated a bloody fluid collection and sent for culture which yielded no organism.  My partner Dr. Gale Journey is notified and he recommended the patient completing 2 weeks of doxycycline and Augmentin.  Since I last saw her she developed worsening erythema which she believes was due to radiation.  He also developed drainage from her left breast  She was seen by general surgery who are concerned about the seroma and she was restarted on Augmentin.  She was seen by general surgery who are concerned  about the seroma and she was restarted on Augmentin.  Ultrasound showed cyst the seroma had diminished in size as did an MRI of the breast.  She had a CT scan of the chest performed which she thinks is showing a nodule though the verbiage is a bit obscure.  Is recently been having increased CSF leakage and sneezing frequently and feeling that her ears are full.  Multiple family members at home have respiratory tract infections that she herself is not coughing.  She has a fairly stressful home life currently with her daughter who does not help her at all and her son-in-law she believes hates her.  She became slightly tearful during interview.  She is in counseling fortunately.  Scheduled to see oncology tomorrow.    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    Meningitis 06/11/2021   bacterial meningitis   Otitis media 04/22/2022   Polycystic ovarian disease    Vaccine counseling 04/22/2022    Past Surgical History:  Procedure Laterality Date   BREAST LUMPECTOMY Left 08/2021   BREAST LUMPECTOMY WITH SENTINEL LYMPH NODE BIOPSY Left 08/23/2021   Procedure: LEFT BREAST LUMPECTOMY WITH SENTINEL LYMPH NODE BIOPSY;  Surgeon: Stark Klein, MD;  Location: [place];  Service: General;  Laterality: Left;   csf leak in skull  06/10/2003   noted failed attempt to close CSF leak in skull   DILATION AND EVACUATION  06/09/1988   miscarriage   IR FLUORO GUIDED NEEDLE PLC ASPIRATION/INJECTION LOC  06/12/2021   RADIOLOGY WITH ANESTHESIA N/A 06/12/2021   Procedure: IR WITH ANESTHESIA;  Surgeon: Radiologist, Medication,  MD;  Location: [place];  Service: Radiology;  Laterality: N/A;   TONSILLECTOMY AND ADENOIDECTOMY  06/10/1971    Family History  Problem Relation Age of Onset   Arthritis Mother    Hypertension Mother    Obesity Mother    Arthritis Father    Prostate cancer Father    Heart disease Father    Marfan syndrome Father    Hyperlipidemia Sister    Diabetes Sister    Marfan syndrome Sister    Breast cancer Sister    Early death Sister    Stroke Sister    Breast cancer Sister    Arthritis Sister    Diabetes Maternal Aunt    Diabetes Maternal Uncle    Bladder Cancer Maternal Uncle    Breast cancer Paternal Aunt    Hypertension Maternal Grandmother    Heart disease Maternal Grandmother    Diabetes Paternal Grandmother    Heart disease Paternal Grandmother    Stroke Paternal Grandmother    Breast cancer Paternal Grandmother    Heart disease Paternal Grandfather       Social History   Socioeconomic History   Marital status: Married    Spouse name: Not on file   Number of children: 2   Years of education: Not on file   Highest education level: Not on file  Occupational History   Not on file  Tobacco Use   Smoking status: Every Day    Packs/day: 1.00    Years: 44.00    Total pack years: 44.00    Types: Cigarettes    Last attempt to quit: 06/10/2021    Years since quitting: 0.8   Smokeless tobacco: Never  Substance and Sexual Activity   Alcohol use: No   Drug use: No   Sexual activity: Yes    Birth control/protection: Other-see comments, Post-menopausal    Comment: vasectomy  Other Topics Concern   Not on file  Social History Narrative   Not on file   Social Determinants of Health   Financial Resource Strain: Medium Risk (08/14/2021)   Overall Financial Resource Strain (CARDIA)    Difficulty of Paying Living Expenses: Somewhat hard  Food Insecurity: No Food Insecurity (08/14/2021)   Hunger Vital Sign    Worried About Running Out of Food in the Last Year: Never true     Ran Out of Food in the Last Year: Never true  Transportation Needs: No Transportation Needs (08/14/2021)   PRAPARE - Hydrologist (Medical): No    Lack of Transportation (Non-Medical): No  Physical Activity: Not on file  Stress: Not on file  Social Connections: Not on file    Allergies  Allergen Reactions   Statins Other (See Comments)    Other reaction(s): Other CSF leaking out nose. Headache   Glimepiride Other (See Comments)    Hypoglicemia  Current Outpatient Medications:    albuterol (VENTOLIN HFA) 108 (90 Base) MCG/ACT inhaler, Inhale 2 puffs into the lungs every 6 (six) hours as needed for wheezing., Disp: 8.5 g, Rfl: 0   ALPRAZolam (XANAX) 0.25 MG tablet, Take 1/2 to 1 tablet by mouth daily as needed for anxiety, Disp: 20 tablet, Rfl: 0   amoxicillin-clavulanate (AUGMENTIN) 875-125 MG tablet, Take 1 tablet by mouth 2 (two) times daily., Disp: 60 tablet, Rfl: 2   Amoxicillin-Pot Clavulanate (AUGMENTIN PO), Take by mouth., Disp: , Rfl:    aspirin EC 81 MG tablet, Take 81 mg by mouth daily. Swallow whole., Disp: , Rfl:    buPROPion (WELLBUTRIN XL) 300 MG 24 hr tablet, Take 1 tablet by mouth every morning., Disp: , Rfl:    buPROPion (WELLBUTRIN XL) 300 MG 24 hr tablet, Take 1 tablet by mouth in the morning, Disp: 90 tablet, Rfl: 3   clobetasol ointment (TEMOVATE) 0.30 %, Apply 1 application externally twice a day for 10 day(s), Disp: 60 g, Rfl: 2   cyclobenzaprine (FLEXERIL) 5 MG tablet, Take 1 tablet (5 mg total) by mouth at bedtime as needed., Disp: 14 tablet, Rfl: 0   dapagliflozin propanediol (FARXIGA) 5 MG TABS tablet, Take 1 tablet by mouth once a day, Disp: 90 tablet, Rfl: 3   diclofenac (VOLTAREN) 75 MG EC tablet, Take 1 tablet by mouth 2 times daily as needed, Disp: 180 tablet, Rfl: 0   diclofenac (VOLTAREN) 75 MG EC tablet, Take 1 tablet (75 mg total) by mouth 2 (two) times daily as needed., Disp: 180 tablet, Rfl: 0   diclofenac (VOLTAREN)  75 MG EC tablet, Take 1 tablet (75 mg total) by mouth 2 (two) times daily., Disp: 180 tablet, Rfl: 0   diclofenac Sodium (VOLTAREN) 1 % GEL, Apply 4 g topically every 6 (six) hours as needed (Shoulder pain)., Disp: 100 g, Rfl: 0   diclofenac Sodium (VOLTAREN) 1 % GEL, Apply 4 grams topically to lower extremity joint every 6 (six) hours., Disp: 100 g, Rfl: 0   Eszopiclone 3 MG TABS, Take 1 tablet (3 mg total) by mouth immediately before bedtime., Disp: 30 tablet, Rfl: 0   ezetimibe (ZETIA) 10 MG tablet, Take 1 tablet by mouth once a day, Disp: 60 tablet, Rfl: 2   fluconazole (DIFLUCAN) 150 MG tablet, Take 1 tablet by mouth once, Disp: 1 tablet, Rfl: 0   insulin glargine (LANTUS SOLOSTAR) 100 UNIT/ML Solostar Pen, Inject 10 Units into the skin daily., Disp: 15 mL, Rfl: 3   Insulin Pen Needle 31G X 8 MM MISC, Use as directed, Disp: 100 each, Rfl: 2   metFORMIN (GLUCOPHAGE) 500 MG tablet, Take 1 tablet by mouth in morning, and 2 tablets by mouth at night once a day, Disp: 270 tablet, Rfl: 3   omeprazole (PRILOSEC) 20 MG capsule, Take 1 capsule by mouth twice a day, Disp: 60 capsule, Rfl: 2   ondansetron (ZOFRAN) 4 MG tablet, Take 1 tablet (4 mg total) by mouth every 8 (eight) hours as needed for nausea or vomiting., Disp: 20 tablet, Rfl: 0   sertraline (ZOLOFT) 100 MG tablet, Take 2 tablets by mouth once a day, Disp: 180 tablet, Rfl: 3   spironolactone (ALDACTONE) 25 MG tablet, TAKE 1 TABLET BY MOUTH ONCE DAILY (Patient taking differently: Take 25 mg by mouth daily.), Disp: 90 tablet, Rfl: 2   spironolactone (ALDACTONE) 25 MG tablet, Take 1 tablet by mouth once a day, Disp: 90 tablet, Rfl: 1   tiZANidine (ZANAFLEX) 4  MG tablet, Take 1 tablet by mouth 3 times daily as needed, Disp: 30 tablet, Rfl: 1   traMADol (ULTRAM) 50 MG tablet, Take 1 tablet by mouth as needed twice a day, Disp: 60 tablet, Rfl: 0   ULTICARE SHORT PEN NEEDLES 31G X 8 MM MISC, Please use to inject Victoza once daily., Disp: 100 each,  Rfl: 2   bisacodyl 5 MG EC tablet, Take by mouth as directed, Disp: 25 tablet, Rfl: 0   polyethylene glycol-electrolytes (NULYTELY) 420 g solution, Take by mouth as directed, Disp: 4000 mL, Rfl: 0   Review of Systems  Constitutional:  Negative for activity change, appetite change, chills, diaphoresis, fatigue, fever and unexpected weight change.  HENT:  Positive for ear pain, rhinorrhea and sinus pressure. Negative for congestion, sneezing, sore throat and trouble swallowing.   Eyes:  Negative for photophobia and visual disturbance.  Respiratory:  Negative for cough, chest tightness, shortness of breath, wheezing and stridor.   Cardiovascular:  Negative for chest pain, palpitations and leg swelling.  Gastrointestinal:  Negative for abdominal distention, abdominal pain, anal bleeding, blood in stool, constipation, diarrhea, nausea and vomiting.  Genitourinary:  Negative for difficulty urinating, dysuria, flank pain and hematuria.  Musculoskeletal:  Negative for arthralgias, back pain, gait problem, joint swelling and myalgias.  Skin:  Positive for wound. Negative for color change, pallor and rash.  Neurological:  Negative for dizziness, tremors, weakness and light-headedness.  Hematological:  Negative for adenopathy. Does not bruise/bleed easily.  Psychiatric/Behavioral:  Positive for dysphoric mood. Negative for agitation, behavioral problems, confusion, decreased concentration and sleep disturbance.        Objective:   Physical Exam Exam conducted with a chaperone present.  Constitutional:      General: She is not in acute distress.    Appearance: Normal appearance. She is well-developed. She is not ill-appearing or diaphoretic.  HENT:     Head: Normocephalic and atraumatic.     Right Ear: Hearing and external ear normal. Swelling present. A middle ear effusion is present.     Left Ear: Hearing and external ear normal.     Nose: No nasal deformity or rhinorrhea.  Eyes:     General:  No scleral icterus.    Conjunctiva/sclera: Conjunctivae normal.     Right eye: Right conjunctiva is not injected.     Left eye: Left conjunctiva is not injected.     Pupils: Pupils are equal, round, and reactive to light.  Neck:     Vascular: No JVD.  Cardiovascular:     Rate and Rhythm: Normal rate and regular rhythm.     Heart sounds: Normal heart sounds, S1 normal and S2 normal. No murmur heard.    No gallop.  Pulmonary:     Effort: No respiratory distress.     Breath sounds: No stridor. No wheezing or rhonchi.  Abdominal:     General: Bowel sounds are normal. There is no distension.     Palpations: Abdomen is soft.     Tenderness: There is no abdominal tenderness.  Musculoskeletal:        General: Normal range of motion.     Right shoulder: Normal.     Left shoulder: Normal.     Cervical back: Normal range of motion and neck supple.     Right hip: Normal.     Left hip: Normal.     Right knee: Normal.     Left knee: Normal.  Lymphadenopathy:     Head:  Right side of head: No submandibular, preauricular or posterior auricular adenopathy.     Left side of head: No submandibular, preauricular or posterior auricular adenopathy.     Cervical: No cervical adenopathy.     Right cervical: No superficial or deep cervical adenopathy.    Left cervical: No superficial or deep cervical adenopathy.  Skin:    General: Skin is warm and dry.     Coloration: Skin is not pale.     Findings: No abrasion, bruising, ecchymosis, erythema, lesion or rash.     Nails: There is no clubbing.  Neurological:     Mental Status: She is alert and oriented to person, place, and time.     Sensory: No sensory deficit.     Coordination: Coordination normal.     Gait: Gait normal.  Psychiatric:        Attention and Perception: Attention normal. She is attentive.        Mood and Affect: Affect is tearful.        Speech: Speech normal.        Behavior: Behavior normal. Behavior is cooperative.         Thought Content: Thought content normal.        Judgment: Judgment normal.     Breast 04/23/2023:        Assessment & Plan:   Left breast operative seroma:  I do not think this area is actually infected the Augmentin has not made much of a difference and seems like it is largely a mix of radiation damage to the skin plus is a seroma that is still there but is diminished in size.   History of CSF leak and haemophilus influenza meningitis and bacteremia.  Continue to take Augmentin with sinus infections otitis to prevent bacteria entering CSF.   Breast cancer going to see oncology tomorrow.  CT scan with nodule: I cannot tell from the way the report is transcribed with the radiologist saying that there is a nodule present or that there is no nodule present.  Need some clarity the radiology on this matter  Depression: she is in therapy currently. Home life is super stressful for her and she does nto receive much support at all there  Vaccine recommended flu and COVID-19 vaccines

## 2022-04-23 ENCOUNTER — Inpatient Hospital Stay: Payer: Commercial Managed Care - HMO

## 2022-04-23 ENCOUNTER — Inpatient Hospital Stay: Payer: Commercial Managed Care - HMO | Admitting: Hematology

## 2022-04-23 DIAGNOSIS — C50412 Malignant neoplasm of upper-outer quadrant of left female breast: Secondary | ICD-10-CM

## 2022-04-29 ENCOUNTER — Ambulatory Visit: Payer: Commercial Managed Care - HMO | Admitting: Physical Therapy

## 2022-05-05 ENCOUNTER — Other Ambulatory Visit (HOSPITAL_BASED_OUTPATIENT_CLINIC_OR_DEPARTMENT_OTHER): Payer: Self-pay

## 2022-05-06 ENCOUNTER — Ambulatory Visit: Payer: Commercial Managed Care - HMO | Admitting: Physical Therapy

## 2022-05-06 ENCOUNTER — Inpatient Hospital Stay: Payer: Commercial Managed Care - HMO | Attending: Adult Health

## 2022-05-06 ENCOUNTER — Inpatient Hospital Stay (HOSPITAL_BASED_OUTPATIENT_CLINIC_OR_DEPARTMENT_OTHER): Payer: Commercial Managed Care - HMO | Admitting: Hematology

## 2022-05-06 VITALS — BP 131/68 | HR 72 | Temp 97.9°F | Resp 20 | Ht 66.0 in | Wt 273.0 lb

## 2022-05-06 DIAGNOSIS — M1990 Unspecified osteoarthritis, unspecified site: Secondary | ICD-10-CM | POA: Diagnosis not present

## 2022-05-06 DIAGNOSIS — F32A Depression, unspecified: Secondary | ICD-10-CM | POA: Insufficient documentation

## 2022-05-06 DIAGNOSIS — I10 Essential (primary) hypertension: Secondary | ICD-10-CM | POA: Diagnosis not present

## 2022-05-06 DIAGNOSIS — C773 Secondary and unspecified malignant neoplasm of axilla and upper limb lymph nodes: Secondary | ICD-10-CM | POA: Insufficient documentation

## 2022-05-06 DIAGNOSIS — Z170 Estrogen receptor positive status [ER+]: Secondary | ICD-10-CM

## 2022-05-06 DIAGNOSIS — I890 Lymphedema, not elsewhere classified: Secondary | ICD-10-CM | POA: Insufficient documentation

## 2022-05-06 DIAGNOSIS — Z923 Personal history of irradiation: Secondary | ICD-10-CM | POA: Insufficient documentation

## 2022-05-06 DIAGNOSIS — Z79899 Other long term (current) drug therapy: Secondary | ICD-10-CM | POA: Insufficient documentation

## 2022-05-06 DIAGNOSIS — E669 Obesity, unspecified: Secondary | ICD-10-CM | POA: Diagnosis not present

## 2022-05-06 DIAGNOSIS — C50412 Malignant neoplasm of upper-outer quadrant of left female breast: Secondary | ICD-10-CM | POA: Insufficient documentation

## 2022-05-06 DIAGNOSIS — E119 Type 2 diabetes mellitus without complications: Secondary | ICD-10-CM | POA: Insufficient documentation

## 2022-05-06 LAB — CBC WITH DIFFERENTIAL (CANCER CENTER ONLY)
Abs Immature Granulocytes: 0.02 10*3/uL (ref 0.00–0.07)
Basophils Absolute: 0.1 10*3/uL (ref 0.0–0.1)
Basophils Relative: 1 %
Eosinophils Absolute: 0.4 10*3/uL (ref 0.0–0.5)
Eosinophils Relative: 6 %
HCT: 43.3 % (ref 36.0–46.0)
Hemoglobin: 14.9 g/dL (ref 12.0–15.0)
Immature Granulocytes: 0 %
Lymphocytes Relative: 24 %
Lymphs Abs: 1.5 10*3/uL (ref 0.7–4.0)
MCH: 30.6 pg (ref 26.0–34.0)
MCHC: 34.4 g/dL (ref 30.0–36.0)
MCV: 88.9 fL (ref 80.0–100.0)
Monocytes Absolute: 0.6 10*3/uL (ref 0.1–1.0)
Monocytes Relative: 10 %
Neutro Abs: 3.6 10*3/uL (ref 1.7–7.7)
Neutrophils Relative %: 59 %
Platelet Count: 279 10*3/uL (ref 150–400)
RBC: 4.87 MIL/uL (ref 3.87–5.11)
RDW: 14.4 % (ref 11.5–15.5)
WBC Count: 6.2 10*3/uL (ref 4.0–10.5)
nRBC: 0 % (ref 0.0–0.2)

## 2022-05-06 LAB — CMP (CANCER CENTER ONLY)
ALT: 15 U/L (ref 0–44)
AST: 13 U/L — ABNORMAL LOW (ref 15–41)
Albumin: 4.4 g/dL (ref 3.5–5.0)
Alkaline Phosphatase: 62 U/L (ref 38–126)
Anion gap: 7 (ref 5–15)
BUN: 13 mg/dL (ref 8–23)
CO2: 25 mmol/L (ref 22–32)
Calcium: 9.5 mg/dL (ref 8.9–10.3)
Chloride: 102 mmol/L (ref 98–111)
Creatinine: 0.8 mg/dL (ref 0.44–1.00)
GFR, Estimated: >60 mL/min (ref >60)
Glucose, Bld: 141 mg/dL — ABNORMAL HIGH (ref 70–99)
Potassium: 4.4 mmol/L (ref 3.5–5.1)
Sodium: 134 mmol/L — ABNORMAL LOW (ref 135–145)
Total Bilirubin: 0.3 mg/dL (ref 0.3–1.2)
Total Protein: 7.5 g/dL (ref 6.5–8.1)

## 2022-05-06 NOTE — Progress Notes (Unsigned)
[place]   [phone] [phone]   Clinic Follow up Note   Patient Care Team: Collene Leyden, MD as PCP - General (Family Medicine) Stark Klein, MD as Consulting Physician (General Surgery) Truitt Merle, MD as Consulting Physician (Hematology) Eppie Gibson, MD as Attending Physician (Radiation Oncology) Gardenia Phlegm, NP as Nurse Practitioner (Hematology and Oncology) Tommy Medal, Lavell Islam, MD as Consulting Physician (Infectious Diseases)  Date of Service:  05/06/2022  CHIEF COMPLAINT: f/u of left breast cancer   CURRENT THERAPY:   Surveillance   ASSESSMENT: *** Lisa [name] is a 61 y.o. female with    1. Malignant neoplasm of upper-outer quadrant of left breast, invasive ductal carcinoma, stage IA, p(T1c, N1a ) cM0, ER+/PR+/HER2-, Grade 2, Oncotype RS 15 -presented with palpable left breast mass. S/p left lumpectomy on 08/23/21 with Dr. Barry Dienes. 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 -Oncotype RS of 15, low risk. -she is currently receiving adjuvant radiation under Dr. Isidore Moos, scheduled to finish tomorrow, 10/22/21. -Giving the strong ER and PR expression in her postmenopausal status, I recommend adjuvant endocrine therapy with aromatase inhibitor for a total of 5-7 years to reduce the risk of cancer recurrence. She expressed concern with tamoxifen given her sister died from blood clots and bleeding when she was on tamoxifen.  Tamoxifen also has interaction with her antidepressants.  I would recommend exemestane. We will wait for her DEXA results before making a final decision. Potential benefits and side effects of AI were discussed with patient and she will consider.  I strongly encouraged her to lose weight which has been difficult for her.  Given her multiple medical comorbidities, especially obesity, if she does not tolerate AI, I will take her off. -We also discussed the breast  cancer surveillance after her surgery. She will continue annual screening mammogram, self exam, and a routine office visit with lab and exam with [place].   2. Genetics -she has a family history of breast cancer in two sisters, a paternal half-aunt, and her paternal grandmother.  -per pt, her sisters had negative genetic testing. -testing on 08/14/21 was negative, with a VUS in BRIP1 gene.   3. Bone Health -she has never had a bone density screening. Plan to obtain baseline soon, I ordered today.   4. DM, depression, HTN, obesity, arthritis  -f/u with PC  -she is interested in weight loss but has been unable to lose on her own. I will refer her to our weight management clinic.  No problem-specific Assessment & Plan notes found for this encounter.  ***   PLAN: -see pt back in year -Referral to Physical Therapy -Discuss seeing NP Charlestine Massed -- SUMMARY OF ONCOLOGIC HISTORY: Oncology History Overview Note   Cancer Staging  Malignant neoplasm of upper-outer quadrant of left breast in female, estrogen receptor positive ([place]) Staging form: Breast, AJCC 8th Edition - Clinical stage from 08/06/2021: Stage IA (cT1c, cN0, cM0, G2, ER+, PR+, HER2-) - Signed by Truitt Merle, MD on 08/13/2021 - Pathologic stage from 08/23/2021: Stage IA (pT1c, pN1a, cM0, G2, ER+, PR+, HER2-, Oncotype DX score: 15) - Signed by Truitt Merle, MD on 10/21/2021     Malignant neoplasm of upper-outer quadrant of left breast in female, estrogen receptor positive ([place])  07/27/2021 Mammogram   CLINICAL DATA:  Palpable lump in the left breast.   EXAM: DIGITAL DIAGNOSTIC BILATERAL MAMMOGRAM WITH TOMOSYNTHESIS AND CAD; ULTRASOUND LEFT BREAST LIMITED  IMPRESSION: Highly suspicious  left breast mass. No left axillary adenopathy. No other abnormalities.   08/06/2021 Cancer Staging   Staging form: Breast, AJCC 8th Edition - Clinical stage from 08/06/2021: Stage IA (cT1c, cN0, cM0, G2, ER+, PR+, HER2-) - Signed by Truitt Merle, MD on  08/13/2021 Stage prefix: Initial diagnosis Histologic grading system: 3 grade system   08/06/2021 Initial Biopsy   Diagnosis Breast, left, needle core biopsy, Retroareolar 1.6cm left mass 3:00 (ribbon clip) - INVASIVE MAMMARY CARCINOMA - DUCTAL CARCINOMA IN SITU - SEE COMMENT Microscopic Comment The biopsy material shows an infiltrative proliferation of cells arranged linearly and in small clusters. Based on the biopsy, the carcinoma appears Nottingham grade 2 of 3 and measures 1.5 cm in greatest linear extent.  E-cadherin is POSITIVE supporting a ductal origin.  PROGNOSTIC INDICATORS Results: The tumor cells are EQUIVOCAL for Her2 (2+). Her2 by FISH will be performed and the results reported separately. Estrogen Receptor: 100%, POSITIVE, STRONG-MODERATE STAINING INTENSITY Progesterone Receptor: 100%, POSITIVE, STRONG STAINING INTENSITY Proliferation Marker Ki67: 5%  FLUORESCENCE IN-SITU HYBRIDIZATION Results: GROUP 5: HER2 **NEGATIVE**   08/12/2021 Initial Diagnosis   Malignant neoplasm of upper-outer quadrant of left breast in female, estrogen receptor positive ([place])    Genetic Testing   [facility] CustomNext Panel was Negative. 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 Report date is 08/28/2021.  The CustomNext-Cancer+RNAinsight panel offered by [facility] includes sequencing and rearrangement analysis for the following 47 genes:  APC, ATM, AXIN2, BARD1, BMPR1A, BRCA1, BRCA2, BRIP1, CDH1, CDK4, CDKN2A, CHEK2, CTNNA1, DICER1, EPCAM, GREM1, HOXB13, KIT, MEN1, MLH1, MSH2, MSH3, MSH6, MUTYH, NBN, NF1, NTHL1, PALB2, PDGFRA, PMS2, POLD1, POLE, PTEN, RAD50, RAD51C, RAD51D, SDHA, SDHB, SDHC, SDHD, SMAD4, SMARCA4, STK11, TP53, TSC1, TSC2, and VHL.  RNA data is routinely analyzed for use in variant interpretation for all genes.   08/23/2021 Cancer Staging   Staging form: Breast, AJCC 8th Edition - Pathologic stage from 08/23/2021: Stage IA (pT1c,  pN1a, cM0, G2, ER+, PR+, HER2-, Oncotype DX score: 15) - Signed by Truitt Merle, MD on 10/21/2021 Stage prefix: Initial diagnosis Multigene prognostic tests performed: Oncotype DX Recurrence score range: Greater than or equal to 11 Histologic grading system: 3 grade system Residual tumor (R): R0 - None   08/23/2021 Definitive Surgery   FINAL MICROSCOPIC DIAGNOSIS:   A. BREAST, LEFT, LUMPECTOMY:  - Invasive and in situ ductal carcinoma, 1.9 cm.  - Invasive carcinoma 0.2 cm from posterior margin and 0.3 cm from anterior margin.  - DCIS 0.1 cm from medial margin.  - Biopsy site and biopsy clip.  - See oncology table.   B. LYMPH NODE, LEFT AXILLARY #1, SENTINEL, EXCISION:  - One lymph node negative for metastatic carcinoma (0/1).   C. LYMPH NODE, LEFT AXILLARY #2, SENTINEL, EXCISION:  - Metastatic carcinoma in one lymph node (1/1).  - Metastasis is 0.4 cm.   D. LYMPH NODE, LEFT AXILLARY #3, SENTINEL, EXCISION:  - One lymph node negative for metastatic carcinoma (0/1).   E. LYMPH NODE, LEFT AXILLARY #4, SENTINEL, EXCISION:  - One lymph node negative for metastatic carcinoma (0/1).   F. LYMPH NODE, LEFT AXILLARY #5, SENTINEL, EXCISION:  - One lymph node negative for metastatic carcinoma (0/1).    08/23/2021 Miscellaneous   Oncotype DX was obtained on the final surgical sample and the recurrence score of 15 predicts a risk of recurrence outside the breast over the next 9 years of 14%, if the patient's only systemic therapy is an antiestrogen for 5  years.  It also predicts no apparent benefit from chemotherapy.    09/25/2021 - 10/22/2021 Radiation Therapy   Site Technique Total Dose (Gy) Dose per Fx (Gy) Completed Fx Beam Energies  Breast, Left: Breast_L_Axilla 3D 40.05/40.05 2.67 15/15 10X, 15X  Breast, Left: Breast_L_Bst 3D 10/10 2 5/5 6X, 15X     10/2021 -  Anti-estrogen oral therapy   Exemestane      INTERVAL HISTORY: *** Lisa [name] is here for a follow up of left breast  cancer She was last seen by me on 10/21/2021 She presents to the clinic husband. Pt states she very emotional, missing her family during the holidays.Pt decline antiestrogen therapy.   All other systems were reviewed with the patient and are negative.  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    Meningitis 06/11/2021   bacterial meningitis   Otitis media 04/22/2022   Polycystic ovarian disease    Vaccine counseling 04/22/2022    SURGICAL HISTORY: Past Surgical History:  Procedure Laterality Date   BREAST LUMPECTOMY Left 08/2021   BREAST LUMPECTOMY WITH SENTINEL LYMPH NODE BIOPSY Left 08/23/2021   Procedure: LEFT BREAST LUMPECTOMY WITH SENTINEL LYMPH NODE BIOPSY;  Surgeon: Stark Klein, MD;  Location: [facility];  Service: General;  Laterality: Left;   csf leak in skull  06/10/2003   noted failed attempt to close CSF leak in skull   DILATION AND EVACUATION  06/09/1988   miscarriage   IR FLUORO GUIDED NEEDLE PLC ASPIRATION/INJECTION LOC  06/12/2021   RADIOLOGY WITH ANESTHESIA N/A 06/12/2021   Procedure: IR WITH ANESTHESIA;  Surgeon: Radiologist, Medication, MD;  Location: [facility];  Service: Radiology;  Laterality: N/A;   TONSILLECTOMY AND ADENOIDECTOMY  06/10/1971    I have reviewed the social history and family history with the patient and they are unchanged from previous note.  ALLERGIES:  is allergic to statins and glimepiride.  MEDICATIONS:  Current Outpatient Medications  Medication Sig Dispense Refill   albuterol (VENTOLIN HFA) 108 (90 Base) MCG/ACT  inhaler Inhale 2 puffs into the lungs every 6 (six) hours as needed for wheezing. 8.5 g 0   ALPRAZolam (XANAX) 0.25 MG tablet Take 1/2 to 1 tablet by mouth daily as needed for anxiety 20 tablet 0   amoxicillin-clavulanate (AUGMENTIN) 875-125 MG tablet Take 1 tablet by mouth 2 (two) times daily. 60 tablet 2   Amoxicillin-Pot Clavulanate (AUGMENTIN PO) Take by mouth.     aspirin EC 81 MG tablet Take 81 mg by mouth daily. Swallow whole.     bisacodyl 5 MG EC tablet Take by mouth as directed 25 tablet 0   buPROPion (WELLBUTRIN XL) 300 MG 24 hr tablet Take 1 tablet by mouth every morning.     buPROPion (WELLBUTRIN XL) 300 MG 24 hr tablet Take 1 tablet by mouth in the morning 90 tablet 3   clobetasol ointment (TEMOVATE) 7.51 % Apply 1 application externally twice a day for 10 day(s) 60 g  2   cyclobenzaprine (FLEXERIL) 5 MG tablet Take 1 tablet (5 mg total) by mouth at bedtime as needed. 14 tablet 0   dapagliflozin propanediol (FARXIGA) 5 MG TABS tablet Take 1 tablet by mouth once a day 90 tablet 3   diclofenac (VOLTAREN) 75 MG EC tablet Take 1 tablet by mouth 2 times daily as needed 180 tablet 0   diclofenac (VOLTAREN) 75 MG EC tablet Take 1 tablet (75 mg total) by mouth 2 (two) times daily as needed. 180 tablet 0   diclofenac (VOLTAREN) 75 MG EC tablet Take 1 tablet (75 mg total) by mouth 2 (two) times daily. 180 tablet 0   diclofenac Sodium (VOLTAREN) 1 % GEL Apply 4 g topically every 6 (six) hours as needed (Shoulder pain). 100 g 0   diclofenac Sodium (VOLTAREN) 1 % GEL Apply 4 grams topically to lower extremity joint every 6 (six) hours. 100 g 0   Eszopiclone 3 MG TABS Take 1 tablet (3 mg total) by mouth immediately before bedtime. 30 tablet 0   ezetimibe (ZETIA) 10 MG tablet Take 1 tablet by mouth once a day 60 tablet 2   fluconazole (DIFLUCAN) 150 MG tablet Take 1 tablet by mouth once 1 tablet 0   insulin glargine (LANTUS SOLOSTAR) 100 UNIT/ML Solostar Pen Inject 10 Units into the skin daily. 15  mL 3   Insulin Pen Needle 31G X 8 MM MISC Use as directed 100 each 2   metFORMIN (GLUCOPHAGE) 500 MG tablet Take 1 tablet by mouth in morning, and 2 tablets by mouth at night once a day 270 tablet 3   omeprazole (PRILOSEC) 20 MG capsule Take 1 capsule by mouth twice a day 60 capsule 2   ondansetron (ZOFRAN) 4 MG tablet Take 1 tablet (4 mg total) by mouth every 8 (eight) hours as needed for nausea or vomiting. 20 tablet 0   polyethylene glycol-electrolytes (NULYTELY) 420 g solution Take by mouth as directed 4000 mL 0   sertraline (ZOLOFT) 100 MG tablet Take 2 tablets by mouth once a day 180 tablet 3   spironolactone (ALDACTONE) 25 MG tablet TAKE 1 TABLET BY MOUTH ONCE DAILY (Patient taking differently: Take 25 mg by mouth daily.) 90 tablet 2   spironolactone (ALDACTONE) 25 MG tablet Take 1 tablet by mouth once a day 90 tablet 1   tiZANidine (ZANAFLEX) 4 MG tablet Take 1 tablet by mouth 3 times daily as needed 30 tablet 1   traMADol (ULTRAM) 50 MG tablet Take 1 tablet by mouth as needed twice a day 60 tablet 0   ULTICARE SHORT PEN NEEDLES 31G X 8 MM MISC Please use to inject Victoza once daily. 100 each 2   No current facility-administered medications for this visit.    PHYSICAL EXAMINATION: ECOG PERFORMANCE STATUS: {CHL ONC ECOG BT:[id number]}  Vitals:   05/06/22 1134  BP: 131/68  Pulse: 72  Resp: 20  Temp: 97.9 F (36.6 C)  SpO2: 96%   Wt Readings from Last 3 Encounters:  05/06/22 273 lb (123.8 kg)  04/22/22 266 lb (120.7 kg)  01/21/22 265 lb 6.4 oz (120.4 kg)     GENERAL:alert, no distress and comfortable SKIN: skin color, texture, turgor are normal, no rashes or significant lesions EYES: normal, Conjunctiva are pink and non-injected, sclera clear NECK: supple, thyroid normal size, non-tender, without nodularity LYMPH:  no palpable lymphadenopathy in the cervical, axillary  LUNGS: clear to auscultation and percussion with normal breathing effort HEART: regular rate & rhythm  and no  murmurs and no lower extremity edema ABDOMEN:abdomen soft, non-tender and normal bowel sounds Musculoskeletal:no cyanosis of digits and no clubbing  NEURO: alert & oriented x 3 with fluent speech, no focal motor/sensory deficits  LABORATORY DATA:  I have reviewed the data as listed    Latest Ref Rng & Units 05/06/2022   11:18 AM 08/14/2021    8:11 AM 07/01/2021    9:26 AM  CBC  WBC 4.0 - 10.5 K/uL 6.2  7.2  7.0   Hemoglobin 12.0 - 15.0 g/dL 14.9  13.3  13.3   Hematocrit 36.0 - 46.0 % 43.3  38.7  40.2   Platelets 150 - 400 K/uL 279  325  394         Latest Ref Rng & Units 08/14/2021    8:11 AM 06/27/2021    5:31 AM 06/21/2021    5:16 AM  CMP  Glucose 70 - 99 mg/dL 222  138  130   BUN 8 - 23 mg/dL _0 Creatinine 0.44 - 1.00 mg/dL 0.77  0.62  0.74   Sodium 135 - 145 mmol/L 135  135  136   Potassium 3.5 - 5.1 mmol/L 3.9  4.2  3.9   Chloride 98 - 111 mmol/L 102  100  103   CO2 22 - 32 mmol/L _1 Calcium 8.9 - 10.3 mg/dL 9.4  9.1  8.3   Total Protein 6.5 - 8.1 g/dL 7.0  6.5  5.7   Total Bilirubin 0.3 - 1.2 mg/dL 0.2  0.5  0.4   Alkaline Phos 38 - 126 U/L 62  51  48   AST 15 - 41 U/L 12  17  46   ALT 0 - 44 U/L 14  25  55       RADIOGRAPHIC STUDIES: I have personally reviewed the radiological images as listed and agreed with the findings in the report. No results found.    No orders of the defined types were placed in this encounter.  All questions were answered. The patient knows to call the clinic with any problems, questions or concerns. No barriers to learning was detected. The total time spent in the appointment was {CHL ONC TIME VISIT - GEXBM:[id number]}.     Baldemar Friday, CMA 05/06/2022   I, Audry Riles, CMA, am acting as scribe for Truitt Merle, MD.   {Add scribe attestation statement}

## 2022-05-07 ENCOUNTER — Encounter: Payer: Self-pay | Admitting: Hematology

## 2022-05-09 ENCOUNTER — Ambulatory Visit: Payer: Commercial Managed Care - HMO | Attending: General Surgery

## 2022-05-09 DIAGNOSIS — Z170 Estrogen receptor positive status [ER+]: Secondary | ICD-10-CM | POA: Insufficient documentation

## 2022-05-09 DIAGNOSIS — C50412 Malignant neoplasm of upper-outer quadrant of left female breast: Secondary | ICD-10-CM | POA: Diagnosis present

## 2022-05-09 DIAGNOSIS — N644 Mastodynia: Secondary | ICD-10-CM | POA: Insufficient documentation

## 2022-05-09 DIAGNOSIS — I890 Lymphedema, not elsewhere classified: Secondary | ICD-10-CM | POA: Insufficient documentation

## 2022-05-09 NOTE — Therapy (Signed)
OUTPATIENT PHYSICAL THERAPY ONCOLOGY EVALUATION  Patient Name: LASHEBA STEVENS MRN: [id number] DOB:[birth year], 61 y.o., female Today's Date: 05/09/2022   PT End of Session - 05/09/22 1052     Visit Number 2    Number of Visits 6    Date for PT Re-Evaluation 05/27/22    Authorization - Visit Number 2    Authorization - Number of Visits 6    Progress Note Due on Visit 6    PT Start Time 1053    PT Stop Time 1146    PT Time Calculation (min) 53 min    Activity Tolerance Patient tolerated treatment well    Behavior During Therapy [facility] for tasks assessed/performed             Past Medical History:  Diagnosis Date   Anxiety    Arthritis    Breast cancer ([place]) 06/28/2021   lump found   Breast cancer ([place])    left breast [place]   Breast hematoma 12/02/2021   Breast mass, left 07/10/2021   Bulging disc L-5   Chicken pox    Depression    Diabetes mellitus    Hyperlipidemia    Hypertension    Insomnia    Lung nodule 04/22/2022   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    Meningitis 06/11/2021   bacterial meningitis   Otitis media 04/22/2022   Polycystic ovarian disease    Vaccine counseling 04/22/2022   Past Surgical History:  Procedure Laterality Date   BREAST LUMPECTOMY Left 08/2021   BREAST LUMPECTOMY WITH SENTINEL LYMPH NODE BIOPSY Left 08/23/2021   Procedure: LEFT BREAST LUMPECTOMY WITH SENTINEL LYMPH NODE BIOPSY;  Surgeon: Stark Klein, MD;  Location: [place];  Service: General;  Laterality: Left;   csf leak in skull  06/10/2003   noted failed attempt to close CSF leak in skull   DILATION AND EVACUATION  06/09/1988   miscarriage   IR FLUORO GUIDED NEEDLE PLC ASPIRATION/INJECTION LOC  06/12/2021   RADIOLOGY WITH ANESTHESIA N/A 06/12/2021   Procedure: IR WITH ANESTHESIA;  Surgeon: Radiologist, Medication, MD;  Location: [place];  Service: Radiology;  Laterality: N/A;   TONSILLECTOMY AND ADENOIDECTOMY  06/10/1971    Patient Active Problem List   Diagnosis Date Noted   Otitis media 04/22/2022   Seroma of breast 04/22/2022   Vaccine counseling 04/22/2022   Lung nodule 04/22/2022   Hyperglycemia due to type 2 diabetes mellitus ([place]) 01/21/2022   Degeneration of lumbar intervertebral disc 01/21/2022   Breast hematoma 12/02/2021   Genetic testing 08/22/2021   Family history of breast cancer 08/14/2021   Malignant neoplasm of upper-outer quadrant of left breast in female, estrogen receptor positive ([place]) 08/12/2021   Breast mass, left 07/10/2021   Fever    Bacterial meningitis    Right sided weakness    Pressure injury of skin 06/13/2021   Sepsis due to Haemophilus influenzae with acute hypoxic respiratory failure and septic shock ([place])    Acute bacterial meningitis 34/19/3790   Acute metabolic encephalopathy 24/02/7352   Altered mental status 06/12/2021   Obesity, morbid, BMI 40.0-49.9 ([place]) 03/24/2016   Family history of breast cancer in sister 10/21/2013   Tick bite of back 10/21/2012   Screening for malignant neoplasm of the cervix 05/19/2012   Routine gynecological examination 05/19/2012   Tobacco use 12/30/2011   Diabetes mellitus, type 2 ([place]) 12/29/2011   HTN (hypertension) 12/29/2011   Hyperlipidemia 12/29/2011   CSF leak 12/29/2011  Psoriasis 12/29/2011   PCOS (polycystic ovarian syndrome) 12/29/2011   Depression with anxiety 12/29/2011    PCP: Collene Leyden, MD  REFERRING PROVIDER: Stark Klein MD  REFERRING DIAG: Left Breast Pain  THERAPY DIAG:  Malignant neoplasm of upper-outer quadrant of left breast in female, estrogen receptor positive ([place])  Lymphedema, not elsewhere classified  Pain of left breast  ONSET DATE: 08/23/2021  Rationale for Evaluation and Treatment: Rehabilitation  SUBJECTIVE:                                                                                                                                                                                            SUBJECTIVE STATEMENT: [employer] came and they made the upper arm looser  and the treatment felt good but  when we moved it to the bedroom and was supine ,when I finished the treatment my arm was really hurting. I think it may have been the positioning and irritating my neck. My left arm is tingling some and both hands are bothering me a lot. The seroma is smaller than it was and doesn't feel as hard. I still have a little drainage from the nipple with manipulating the breast, but not by itself.  I took the antibiotics for 2 weeks. I can't tell if it did  anything, but the infectious disease doctor didn't think it was infected. The breast and nipple area is still really swollen.  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 She had negative margins and 1/5 positive nodes for a final path of pT1cN1a. She received adjuvant radiation. She was going to start anastrozole, but didn't. She was seen on 09/06/2021 with pain and swelling in the axilla, thought to have a possible seroma in the axilla did not attempt aspiration due to history of bacterial meningitis and possible risk of infection. DM, depression, HTN, obesity, arthritis She returned to MD 03/24/2022 with recurrent nipple drainage.  She had  PT for lymphedema and she got a flexi touch. She is doing manual lymph drainage once daily and the flexitouch once daily as well as wearing compression bra. She got mammogram and ultrasound that showed a 3.9 cm residual seroma that appeared to be non infected . She was advised to continue with compression bra and that fluid may come out until seroma is gone. Not a problem per MD as long as there is no infection.  PAIN:  Are you having pain? Yes NPRS scale: 6/10 Pain location: Left Breast Pain orientation: Left  PAIN TYPE:  sharp Pain description: intermittent  Aggravating factors: When more swollen, PM Relieving factors: tramadol helps some  PRECAUTIONS: Left breast lymphedema, DM, HTN,OA, temporal bone malformation  WEIGHT BEARING RESTRICTIONS: [place] uses a walker for safety.  FALLS:  Has patient fallen in last 6 months? Yes. Number of falls 1-2  LIVING ENVIRONMENT: Lives with: husband, son, daughter daughter in law and 3 children Lives in: House/apartment Stairs: Yes; pt lives on first floor. Only has 1 step driveway to porch Has following equipment at home: Single point cane, Environmental consultant - 2 wheeled, Environmental consultant - 4 wheeled, Manufacturing engineer  OCCUPATION: unemployed  LEISURE: does not exercise, [facility] at [facility] for [facility] and right shoulder  HAND DOMINANCE: right   PRIOR LEVEL OF FUNCTION: Independent with community mobility with device  PATIENT GOALS: To find a way to decrease swelling/pain   OBJECTIVE:  COGNITION: Overall cognitive status: Within functional limits for tasks assessed   PALPATION: Fibrotic especially medial breast, with tenderness and at incision area. Mild swelling noted at lateral trunk, tender at left axillary border of pectoras  OBSERVATIONS / OTHER ASSESSMENTS: significant left nipple swelling, enlarged pores, medial breast fibrosis, redness, heat  SENSATION: Light touch: Appears intact   POSTURE: forward head, rounded shoulders  UPPER EXTREMITY AROM/PROM:  A/PROM RIGHT   eval Has right shoulder pain  Shoulder extension   Shoulder flexion 130  Shoulder abduction 125 +  Shoulder internal rotation   Shoulder external rotation     (Blank rows = not tested)  A/PROM LEFT   eval  Shoulder extension   Shoulder flexion 147 + axilla  Shoulder abduction 147 + shoulder  Shoulder internal rotation   Shoulder external rotation     (Blank rows = not tested)  CERVICAL AROM: All within normal limits:     UPPER EXTREMITY STRENGTH: WFL6.6  LYMPHEDEMA  ASSESSMENTS:   SURGERY TYPE/DATE: L lumpectomy 08/23/21   NUMBER OF LYMPH NODES REMOVED: 1/5  CHEMOTHERAPY: NO  RADIATION:completed 2023  HORMONE TREATMENT: NO  INFECTIONS: possible infection of seroma Left breast at end of March 2023  LYMPHEDEMA ASSESSMENTS:   LANDMARK RIGHT  eval  10 cm proximal to olecranon process 33.6  Olecranon process 27.7  10 cm proximal to ulnar styloid process 23.5  Just proximal to ulnar styloid process 17.7  Across hand at thumb web space 20.5  At base of 2nd digit 6.6  (Blank rows = not tested)  LANDMARK LEFT  eval  10 cm proximal to olecranon process 33.0  Olecranon process 27.0  10 cm proximal to ulnar styloid process 23.0  Just proximal to ulnar styloid process 17.9  Across hand at thumb web space 20.4  At base of 2nd digit 6.6  (Blank rows = not tested)     BREAST COMPLAINTS SURVEY  64  TODAY'S TREATMENT:   05/09/2022 Initiated left breast MLD in supine with head of table elevated In supine: Short neck, 5 diaphragmatic breaths, R axillary nodes and establishment of interaxillary pathway, L inguinal nodes and establishment of axilloinguinal pathway, then L breast moving fluid towards pathways spending extra time in any areas of fibrosis then retracing all steps. STM performed to fibrotic areas prior to and during MLD Large chip pack made for pt to try in compression bra to decrease fibrosis; She will try for a short period to be sure it is comfortable.  DATE:04/15/2022 No treatment provided due to concerns of infection. Messaged MD and CMA and placed photo in media. Requested a follow up visit for pt prior to [place] beginning treatment   PATIENT EDUCATION:  Education details: pt. Person educated: Patient Education method: Explanation Education comprehension: verbalized understanding  HOME EXERCISE  PROGRAM: None given  ASSESSMENT:  CLINICAL IMPRESSION: Pt was on 2 weeks of antibiotics with some decrease in left breast redness. She continues with significantly enlarged pores and increase in fibrosis especially medial breast > lateral breast. The nipple continues to be very swollen and the entire breast continues to be warm. She indicates it has been warm since surgery. There was mild breast softening after MLD. Pt was given a large chip pack to try in her compression bra for a couple of hours. She thinks the problem with the flexitouch and arm pain may have been from her positioning and strain on her neck so she will try to simulate the demonstration position at home for a short trial 10-15 minutes as long as it is comfortable to test it.   OBJECTIVE IMPAIRMENTS: increased edema and pain.   ACTIVITY LIMITATIONS: lifting, bed mobility, and reach over head  PARTICIPATION LIMITATIONS: able to do what is required of her but with pain  PERSONAL FACTORS: 3+ comorbidities: Left breast cancer s/p radiation, DM,Bacterial meningitis  are also affecting patient's functional outcome.   REHAB POTENTIAL: Good  CLINICAL DECISION MAKING: Stable/uncomplicated  EVALUATION COMPLEXITY: Low  GOALS: Goals reviewed with patient? Yes STG =LONG TERM GOALS: Target date: 06/20/2022     Pt will be independent in self MLD for long term management of lymphedema  Baseline:  Goal status: INITIAL  2.  Pt will report decreased left breast pain by 50% or more Baseline:  Goal status: INITIAL  3.  Breast complaints survey no greater than 18 points Baseline:  Goal status: INITIAL  4.  Decreased breast swelling/fibrosis improved by 50% Baseline:  Goal status: INITIAL  PLAN:  PT FREQUENCY: 1-2x/week  PT DURATION: 6 weeks  PLANNED INTERVENTIONS: Therapeutic exercises, Therapeutic activity, Patient/Family education, Self Care, Orthotic/Fit training, Manual lymph drainage, scar mobilization, and Manual  therapy  PLAN FOR NEXT SESSION: See if pt was placed on antibiotics and hold rx until swelling/pain starts to subside, initiate MLD and review with pt,, STM axillary border of pectorals   Claris Pong, PT 05/09/2022, 11:48 AM

## 2022-05-14 ENCOUNTER — Ambulatory Visit: Payer: Commercial Managed Care - HMO

## 2022-05-14 DIAGNOSIS — N644 Mastodynia: Secondary | ICD-10-CM

## 2022-05-14 DIAGNOSIS — C50412 Malignant neoplasm of upper-outer quadrant of left female breast: Secondary | ICD-10-CM | POA: Diagnosis not present

## 2022-05-14 DIAGNOSIS — Z170 Estrogen receptor positive status [ER+]: Secondary | ICD-10-CM

## 2022-05-14 DIAGNOSIS — I890 Lymphedema, not elsewhere classified: Secondary | ICD-10-CM

## 2022-05-14 NOTE — Therapy (Signed)
OUTPATIENT PHYSICAL THERAPY ONCOLOGY EVALUATION  Patient Name: Lisa [name] MRN: [id number] DOB:[birth year], 61 y.o., female Today's Date: 05/14/2022   PT End of Session - 05/14/22 1003     Visit Number 3    Number of Visits 6    Date for PT Re-Evaluation 05/27/22    Authorization - Visit Number 3    Authorization - Number of Visits 6    Progress Note Due on Visit 6    PT Start Time 1004    PT Stop Time 1050    PT Time Calculation (min) 46 min    Activity Tolerance Patient tolerated treatment well    Behavior During Therapy [facility] for tasks assessed/performed             Past Medical History:  Diagnosis Date   Anxiety    Arthritis    Breast cancer ([place]) 06/28/2021   lump found   Breast cancer ([place])    left breast [place]   Breast hematoma 12/02/2021   Breast mass, left 07/10/2021   Bulging disc L-5   Chicken pox    Depression    Diabetes mellitus    Hyperlipidemia    Hypertension    Insomnia    Lung nodule 04/22/2022   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    Meningitis 06/11/2021   bacterial meningitis   Otitis media 04/22/2022   Polycystic ovarian disease    Vaccine counseling 04/22/2022   Past Surgical History:  Procedure Laterality Date   BREAST LUMPECTOMY Left 08/2021   BREAST LUMPECTOMY WITH SENTINEL LYMPH NODE BIOPSY Left 08/23/2021   Procedure: LEFT BREAST LUMPECTOMY WITH SENTINEL LYMPH NODE BIOPSY;  Surgeon: Stark Klein, MD;  Location: [place] [facility] [place];  Service: General;  Laterality: Left;   csf leak in skull  06/10/2003   noted failed attempt to close CSF leak in skull   DILATION AND EVACUATION  06/09/1988   miscarriage   IR FLUORO GUIDED NEEDLE PLC ASPIRATION/INJECTION LOC  06/12/2021   RADIOLOGY WITH ANESTHESIA N/A 06/12/2021   Procedure: IR WITH ANESTHESIA;  Surgeon: Radiologist, Medication, MD;  Location: [place];  Service: Radiology;  Laterality: N/A;   TONSILLECTOMY AND ADENOIDECTOMY  06/10/1971    Patient Active Problem List   Diagnosis Date Noted   Otitis media 04/22/2022   Seroma of breast 04/22/2022   Vaccine counseling 04/22/2022   Lung nodule 04/22/2022   Hyperglycemia due to type 2 diabetes mellitus ([place]) 01/21/2022   Degeneration of lumbar intervertebral disc 01/21/2022   Breast hematoma 12/02/2021   Genetic testing 08/22/2021   Family history of breast cancer 08/14/2021   Malignant neoplasm of upper-outer quadrant of left breast in female, estrogen receptor positive ([place]) 08/12/2021   Breast mass, left 07/10/2021   Fever    Bacterial meningitis    Right sided weakness    Pressure injury of skin 06/13/2021   Sepsis due to Haemophilus influenzae with acute hypoxic respiratory failure and septic shock ([place])    Acute bacterial meningitis 27/78/2423   Acute metabolic encephalopathy 53/61/4431   Altered mental status 06/12/2021   Obesity, morbid, BMI 40.0-49.9 ([place]) 03/24/2016   Family history of breast cancer in sister 10/21/2013   Tick bite of back 10/21/2012   Screening for malignant neoplasm of the cervix 05/19/2012   Routine gynecological examination 05/19/2012   Tobacco use 12/30/2011   Diabetes mellitus, type 2 ([place]) 12/29/2011   HTN (hypertension) 12/29/2011   Hyperlipidemia 12/29/2011   CSF leak 12/29/2011  Psoriasis 12/29/2011   PCOS (polycystic ovarian syndrome) 12/29/2011   Depression with anxiety 12/29/2011    PCP: Collene Leyden, MD  REFERRING PROVIDER: Stark Klein MD  REFERRING DIAG: Left Breast Pain  THERAPY DIAG:  Malignant neoplasm of upper-outer quadrant of left breast in female, estrogen receptor positive ([place])  Lymphedema, not elsewhere classified  Pain of left breast  ONSET DATE: 08/23/2021  Rationale for Evaluation and Treatment: Rehabilitation  SUBJECTIVE:                                                                                                                                                                                            SUBJECTIVE STATEMENT: I used my flexi touch and I put the foam in my bra. The good thing is the pain is better. I can't tell about the swelling.  I don't have the compression bra on now. I wonder if I am getting neuropathy. I am getting tingling in my left arm from the elbow into my hand, and on the right more of a stabbing pain in my finger. My hands get stiff in the am. I scheduled a gel shot for my knee.  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 She had negative margins and 1/5 positive nodes for a final path of pT1cN1a. She received adjuvant radiation. She was going to start anastrozole, but didn't. She was seen on 09/06/2021 with pain and swelling in the axilla, thought to have a possible seroma in the axilla did not attempt aspiration due to history of bacterial meningitis and possible risk of infection. DM, depression, HTN, obesity, arthritis She returned to [place] 03/24/2022 with recurrent nipple drainage.  She had  PT for lymphedema and she got a flexi touch. She is doing manual lymph drainage once daily and the flexitouch once daily as well as wearing compression bra. She got mammogram and ultrasound that showed a 3.9 cm residual seroma that appeared to be non infected . She was advised to continue with compression bra and that fluid may come out until seroma is gone. Not a problem per MD as long as there is no infection.  PAIN:  Are you having pain? NO not presently NPRS scale: 0/10 Pain location: Left Breast Pain orientation: Left  PAIN TYPE: sharp Pain description: intermittent  Aggravating factors: When more swollen, PM Relieving factors: tramadol helps some  PRECAUTIONS: Left breast lymphedema, DM, HTN,OA, temporal bone malformation  WEIGHT BEARING RESTRICTIONS: [place] uses a walker for safety.  FALLS:  Has patient fallen in last 6 months? Yes. Number of falls 1-2  LIVING ENVIRONMENT: Lives with: husband, son, daughter daughter in law and 3 children Lives in: House/apartment Stairs: Yes; pt lives on first floor. Only has 1 step driveway to porch Has following equipment at home: Single point cane, Environmental consultant - 2 wheeled, Environmental consultant - 4 wheeled, Manufacturing engineer  OCCUPATION: unemployed  LEISURE: does not exercise, [facility] for [facility] and right shoulder  HAND DOMINANCE: right   PRIOR LEVEL OF FUNCTION: Independent with community mobility with device  PATIENT GOALS: To find a way to decrease swelling/pain   OBJECTIVE:  COGNITION: Overall cognitive status: Within functional limits for tasks assessed   PALPATION: Fibrotic especially medial breast, with tenderness and at incision area. Mild swelling noted at lateral trunk, tender at left axillary border of pectoras  OBSERVATIONS / OTHER ASSESSMENTS: significant left nipple swelling, enlarged pores, medial breast fibrosis, redness, heat  SENSATION: Light touch: Appears intact   POSTURE: forward head, rounded shoulders  UPPER EXTREMITY AROM/PROM:  A/PROM RIGHT   eval Has right shoulder pain  Shoulder extension   Shoulder flexion 130  Shoulder abduction 125 +  Shoulder internal rotation   Shoulder external rotation     (Blank rows = not tested)  A/PROM LEFT   eval  Shoulder extension   Shoulder flexion 147 + axilla  Shoulder abduction 147 + shoulder  Shoulder internal rotation   Shoulder external rotation     (Blank rows = not tested)  CERVICAL AROM: All within normal limits:     UPPER EXTREMITY STRENGTH: WFL6.6  LYMPHEDEMA ASSESSMENTS:   SURGERY TYPE/DATE: L lumpectomy 08/23/21   NUMBER OF LYMPH NODES REMOVED: 1/5  CHEMOTHERAPY: NO  RADIATION:completed 2023  HORMONE TREATMENT: NO  INFECTIONS: possible infection of seroma Left breast at end of March 2023  LYMPHEDEMA ASSESSMENTS:   LANDMARK  RIGHT  eval  10 cm proximal to olecranon process 33.6  Olecranon process 27.7  10 cm proximal to ulnar styloid process 23.5  Just proximal to ulnar styloid process 17.7  Across hand at thumb web space 20.5  At base of 2nd digit 6.6  (Blank rows = not tested)  LANDMARK LEFT  eval  10 cm proximal to olecranon process 33.0  Olecranon process 27.0  10 cm proximal to ulnar styloid process 23.0  Just proximal to ulnar styloid process 17.9  Across hand at thumb web space 20.4  At base of 2nd digit 6.6  (Blank rows = not tested)     BREAST COMPLAINTS SURVEY  64  TODAY'S TREATMENT:  Continued Left breast MLD in supine with head of table elevated In supine: Short neck, 5 diaphragmatic breaths, R axillary nodes and establishment of interaxillary pathway, L inguinal nodes and establishment of axilloinguinal pathway, then L breast moving fluid towards pathways spending extra time in any areas of fibrosis then retracing all steps. STM performed to fibrotic areas prior to and during MLD. Showed pt [employer] Swell spot for full bra and Breast on Compression guru and gave pt Website name to see if she wants to purchase to help with fibrosis  05/09/2022 Initiated left breast MLD in supine with head of table elevated In supine: Short neck, 5 diaphragmatic breaths, R axillary nodes and establishment of interaxillary pathway, L inguinal nodes and establishment of axilloinguinal pathway, then L breast moving fluid towards pathways spending extra time in any areas of fibrosis then retracing all steps. STM performed to fibrotic areas prior to and during  MLD Large chip pack made for pt to try in compression bra to decrease fibrosis; She will try for a short period to be sure it is comfortable.                                                                                                                                         DATE:04/15/2022 No treatment provided due to concerns of infection. Messaged MD and  CMA and placed photo in media. Requested a follow up visit for pt prior to [place] beginning treatment   PATIENT EDUCATION:  Education details: pt. Person educated: Patient Education method: Explanation Education comprehension: verbalized understanding  HOME EXERCISE PROGRAM: None given  ASSESSMENT:  CLINICAL IMPRESSION: Pt has not had left breast pain for approximately 4 days. She continues with warmth to the breast and fibrosis medial greater than lateral but breast is much softer overall today than last visit. Good improvement overall.  OBJECTIVE IMPAIRMENTS: increased edema and pain.   ACTIVITY LIMITATIONS: lifting, bed mobility, and reach over head  PARTICIPATION LIMITATIONS: able to do what is required of her but with pain  PERSONAL FACTORS: 3+ comorbidities: Left breast cancer s/p radiation, DM,Bacterial meningitis  are also affecting patient's functional outcome.   REHAB POTENTIAL: Good  CLINICAL DECISION MAKING: Stable/uncomplicated  EVALUATION COMPLEXITY: Low  GOALS: Goals reviewed with patient? Yes STG =LONG TERM GOALS: Target date: 06/25/2022     Pt will be independent in self MLD for long term management of lymphedema  Baseline:  Goal status: INITIAL  2.  Pt will report decreased left breast pain by 50% or more Baseline:  Goal status: INITIAL  3.  Breast complaints survey no greater than 18 points Baseline:  Goal status: INITIAL  4.  Decreased breast swelling/fibrosis improved by 50% Baseline:  Goal status: INITIAL  PLAN:  PT FREQUENCY: 1-2x/week  PT DURATION: 6 weeks  PLANNED INTERVENTIONS: Therapeutic exercises, Therapeutic activity, Patient/Family education, Self Care, Orthotic/Fit training, Manual lymph drainage, scar mobilization, and Manual therapy  PLAN FOR NEXT SESSION:  initiate MLD and review with pt,, Swell spot?,STM axillary border of pectorals   Claris Pong, PT 05/14/2022, 10:52 AM

## 2022-05-23 ENCOUNTER — Ambulatory Visit: Payer: Commercial Managed Care - HMO

## 2022-05-26 ENCOUNTER — Other Ambulatory Visit (HOSPITAL_BASED_OUTPATIENT_CLINIC_OR_DEPARTMENT_OTHER): Payer: Self-pay

## 2022-05-26 LAB — LAB REPORT - SCANNED
A1c: 7.1
Creatinine, POC: 70 mg/dL
Microalb Creat Ratio: 27
Microalbumin, Urine: 1.9

## 2022-05-26 MED ORDER — TRAMADOL HCL 50 MG PO TABS
50.0000 mg | ORAL_TABLET | Freq: Two times a day (BID) | ORAL | 0 refills | Status: AC | PRN
Start: 2022-05-26 — End: ?
  Filled 2022-05-26: qty 60, 30d supply, fill #0

## 2022-05-26 MED ORDER — ALPRAZOLAM 0.25 MG PO TABS
0.1250 mg | ORAL_TABLET | Freq: Every day | ORAL | 0 refills | Status: AC | PRN
  Filled 2022-05-26: qty 20, 20d supply, fill #0

## 2022-05-27 ENCOUNTER — Ambulatory Visit: Payer: Commercial Managed Care - HMO

## 2022-05-27 DIAGNOSIS — C50412 Malignant neoplasm of upper-outer quadrant of left female breast: Secondary | ICD-10-CM | POA: Diagnosis not present

## 2022-05-27 DIAGNOSIS — I890 Lymphedema, not elsewhere classified: Secondary | ICD-10-CM

## 2022-05-27 DIAGNOSIS — N644 Mastodynia: Secondary | ICD-10-CM

## 2022-05-27 NOTE — Therapy (Signed)
OUTPATIENT PHYSICAL THERAPY ONCOLOGY EVALUATION  Patient Name: MERCER STALLWORTH MRN: [id number] DOB:30-Aug-1960, 61 y.o., female Today's Date: 05/27/2022   PT End of Session - 05/27/22 1101     Visit Number 4    Number of Visits 6    Date for PT Re-Evaluation 06/08/22    Authorization - Visit Number 4    Authorization - Number of Visits 6    Progress Note Due on Visit 6    PT Start Time 1103    PT Stop Time 1147    PT Time Calculation (min) 44 min    Activity Tolerance Patient tolerated treatment well    Behavior During Therapy [facility] for tasks assessed/performed             Past Medical History:  Diagnosis Date   Anxiety    Arthritis    Breast cancer ([place]) 06/28/2021   lump found   Breast cancer ([place])    left breast [place]   Breast hematoma 12/02/2021   Breast mass, left 07/10/2021   Bulging disc L-5   Chicken pox    Depression    Diabetes mellitus    Hyperlipidemia    Hypertension    Insomnia    Lung nodule 04/22/2022   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    Meningitis 06/11/2021   bacterial meningitis   Otitis media 04/22/2022   Polycystic ovarian disease    Vaccine counseling 04/22/2022   Past Surgical History:  Procedure Laterality Date   BREAST LUMPECTOMY Left 08/2021   BREAST LUMPECTOMY WITH SENTINEL LYMPH NODE BIOPSY Left 08/23/2021   Procedure: LEFT BREAST LUMPECTOMY WITH SENTINEL LYMPH NODE BIOPSY;  Surgeon: Stark Klein, MD;  Location: [place];  Service: General;  Laterality: Left;   csf leak in skull  06/10/2003   noted failed attempt to close CSF leak in skull   DILATION AND EVACUATION  06/09/1988   miscarriage   IR FLUORO GUIDED NEEDLE PLC ASPIRATION/INJECTION LOC  06/12/2021   RADIOLOGY WITH ANESTHESIA N/A 06/12/2021   Procedure: IR WITH ANESTHESIA;  Surgeon: Radiologist, Medication, MD;  Location: [place];  Service: Radiology;  Laterality: N/A;   TONSILLECTOMY AND ADENOIDECTOMY  06/10/1971    Patient Active Problem List   Diagnosis Date Noted   Otitis media 04/22/2022   Seroma of breast 04/22/2022   Vaccine counseling 04/22/2022   Lung nodule 04/22/2022   Hyperglycemia due to type 2 diabetes mellitus ([place]) 01/21/2022   Degeneration of lumbar intervertebral disc 01/21/2022   Breast hematoma 12/02/2021   Genetic testing 08/22/2021   Family history of breast cancer 08/14/2021   Malignant neoplasm of upper-outer quadrant of left breast in female, estrogen receptor positive ([place]) 08/12/2021   Breast mass, left 07/10/2021   Fever    Bacterial meningitis    Right sided weakness    Pressure injury of skin 06/13/2021   Sepsis due to Haemophilus influenzae with acute hypoxic respiratory failure and septic shock ([place])    Acute bacterial meningitis 38/45/3646   Acute metabolic encephalopathy 80/32/1224   Altered mental status 06/12/2021   Obesity, morbid, BMI 40.0-49.9 ([place]) 03/24/2016   Family history of breast cancer in sister 10/21/2013   Tick bite of back 10/21/2012   Screening for malignant neoplasm of the cervix 05/19/2012   Routine gynecological examination 05/19/2012   Tobacco use 12/30/2011   Diabetes mellitus, type 2 ([place]) 12/29/2011   HTN (hypertension) 12/29/2011   Hyperlipidemia 12/29/2011   CSF leak 12/29/2011  Psoriasis 12/29/2011   PCOS (polycystic ovarian syndrome) 12/29/2011   Depression with anxiety 12/29/2011    PCP: Collene Leyden, MD  REFERRING PROVIDER: Stark Klein MD  REFERRING DIAG: Left Breast Pain  THERAPY DIAG:  Malignant neoplasm of upper-outer quadrant of left breast in female, estrogen receptor positive ([place])  Lymphedema, not elsewhere classified  Pain of left breast  ONSET DATE: 08/23/2021  Rationale for Evaluation and Treatment: Rehabilitation  SUBJECTIVE:                                                                                                                                                                                            SUBJECTIVE STATEMENT: I have been wearing the compression and the foam. That seems to be helping. I tried the flexi touch but it seemed to make it worse. Maybe I did it too long. My nipple looked really bad. I brought my husband so he can learn how to do it.  My hands have been hurting all the time. I think its the OA. Still having numbness from the elbow down. I saw my PCP but they didn't address it at all.  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 She had negative margins and 1/5 positive nodes for a final path of pT1cN1a. She received adjuvant radiation. She was going to start anastrozole, but didn't. She was seen on 09/06/2021 with pain and swelling in the axilla, thought to have a possible seroma in the axilla did not attempt aspiration due to history of bacterial meningitis and possible risk of infection. DM, depression, HTN, obesity, arthritis She returned to [place] 03/24/2022 with recurrent nipple drainage.  She had  PT for lymphedema and she got a flexi touch. She is doing manual lymph drainage once daily and the flexitouch once daily as well as wearing compression bra. She got mammogram and ultrasound that showed a 3.9 cm residual seroma that appeared to be non infected . She was advised to continue with compression bra and that fluid may come out until seroma is gone. Not a problem per MD as long as there is no infection.  PAIN:  Are you having pain? NO not presently NPRS scale: 0/10 Pain location: Left Breast Pain orientation: Left  PAIN TYPE: sharp Pain description: intermittent  Aggravating factors: When more swollen, PM Relieving factors: tramadol helps some  PRECAUTIONS: Left breast lymphedema, DM, HTN,OA, temporal bone malformation  WEIGHT BEARING RESTRICTIONS: [place] uses a walker for  safety.  FALLS:  Has patient fallen  in last 6 months? Yes. Number of falls 1-2  LIVING ENVIRONMENT: Lives with: husband, son, daughter daughter in law and 3 children Lives in: House/apartment Stairs: Yes; pt lives on first floor. Only has 1 step driveway to porch Has following equipment at home: Single point cane, Environmental consultant - 2 wheeled, Environmental consultant - 4 wheeled, Manufacturing engineer  OCCUPATION: unemployed  LEISURE: does not exercise, [facility] PT at [facility] for LE strengthening and right shoulder  HAND DOMINANCE: right   PRIOR LEVEL OF FUNCTION: Independent with community mobility with device  PATIENT GOALS: To find a way to decrease swelling/pain   OBJECTIVE:  COGNITION: Overall cognitive status: Within functional limits for tasks assessed   PALPATION: Fibrotic especially medial breast, with tenderness and at incision area. Mild swelling noted at lateral trunk, tender at left axillary border of pectoras  OBSERVATIONS / OTHER ASSESSMENTS: significant left nipple swelling, enlarged pores, medial breast fibrosis, redness, heat  SENSATION: Light touch: Appears intact   POSTURE: forward head, rounded shoulders  UPPER EXTREMITY AROM/PROM:  A/PROM RIGHT   eval Has right shoulder pain  Shoulder extension   Shoulder flexion 130  Shoulder abduction 125 +  Shoulder internal rotation   Shoulder external rotation     (Blank rows = not tested)  A/PROM LEFT   eval  Shoulder extension   Shoulder flexion 147 + axilla  Shoulder abduction 147 + shoulder  Shoulder internal rotation   Shoulder external rotation     (Blank rows = not tested)  CERVICAL AROM: All within normal limits:     UPPER EXTREMITY STRENGTH: WFL6.6  LYMPHEDEMA ASSESSMENTS:   SURGERY TYPE/DATE: L lumpectomy 08/23/21   NUMBER OF LYMPH NODES REMOVED: 1/5  CHEMOTHERAPY: NO  RADIATION:completed 2023  HORMONE TREATMENT: NO  INFECTIONS: possible infection of seroma Left breast at end of March 2023  LYMPHEDEMA  ASSESSMENTS:   LANDMARK RIGHT  eval  10 cm proximal to olecranon process 33.6  Olecranon process 27.7  10 cm proximal to ulnar styloid process 23.5  Just proximal to ulnar styloid process 17.7  Across hand at thumb web space 20.5  At base of 2nd digit 6.6  (Blank rows = not tested)  LANDMARK LEFT  eval  10 cm proximal to olecranon process 33.0  Olecranon process 27.0  10 cm proximal to ulnar styloid process 23.0  Just proximal to ulnar styloid process 17.9  Across hand at thumb web space 20.4  At base of 2nd digit 6.6  (Blank rows = not tested)     BREAST COMPLAINTS SURVEY  64 eval,   42 05/27/2022  TODAY'S TREATMENT:  05/27/2022 Continued Left breast MLD in supine with head of table elevated. Performed MLD steps and had pts husband perform each step after my demonstration In supine: Short neck, 5 diaphragmatic breaths, R axillary nodes and establishment of interaxillary pathway, L inguinal nodes and establishment of axilloinguinal pathway, then L breast moving fluid towards pathways spending extra time in any areas of fibrosis then retracing all steps. STM performed to fibrotic areas prior to and during MLD. Showed pts. Husband  [employer] Swell spot for full bra and Breast on Compression guru and gave them Website name to see if she wants to purchase to help with fibrosis   05/14/2022 Continued Left breast MLD in supine with head of table elevated In supine: Short neck, 5 diaphragmatic breaths, R axillary nodes and establishment of interaxillary pathway, L inguinal nodes and establishment of axilloinguinal pathway, then L breast moving fluid towards pathways spending extra  time in any areas of fibrosis then retracing all steps. STM performed to fibrotic areas prior to and during MLD. Showed pt [employer] Swell spot for full bra and Breast on Compression guru and gave pt Website name to see if she wants to purchase to help with fibrosis  05/09/2022 Initiated left breast MLD in supine  with head of table elevated In supine: Short neck, 5 diaphragmatic breaths, R axillary nodes and establishment of interaxillary pathway, L inguinal nodes and establishment of axilloinguinal pathway, then L breast moving fluid towards pathways spending extra time in any areas of fibrosis then retracing all steps. STM performed to fibrotic areas prior to and during MLD Large chip pack made for pt to try in compression bra to decrease fibrosis; She will try for a short period to be sure it is comfortable.                                                                                                                                         DATE:04/15/2022 No treatment provided due to concerns of infection. Messaged MD and CMA and placed photo in media. Requested a follow up visit for pt prior to [place] beginning treatment   PATIENT EDUCATION:  Education details: pt. Person educated: Patient Education method: Explanation Education comprehension: verbalized understanding  HOME EXERCISE PROGRAM: None given  ASSESSMENT:  CLINICAL IMPRESSION: Pts breast pain has improved and her breast complaints score has improved 22 points overall.  She continues with significantly enlarged pores and fibrosis in the medial and lateral breast. She is no longer able to do MLD on herself due to bilateral hand/arm pain. She was advised to see a physician regarding this. She has used her Flexi touch some and her breast feels softer on 1 side afterward, but it does not help the medial side and she thinks it may make it worse. She brought her husband today and he was instructed in all the steps of MLD. He did quite well and used good pressure. She will benefit from an additional session this week, and 1 next week to improve swelling and review with her husband.  OBJECTIVE IMPAIRMENTS: increased edema and pain.   ACTIVITY LIMITATIONS: lifting, bed mobility, and reach over head  PARTICIPATION LIMITATIONS: able to do what is  required of her but with pain  PERSONAL FACTORS: 3+ comorbidities: Left breast cancer s/p radiation, DM,Bacterial meningitis  are also affecting patient's functional outcome.   REHAB POTENTIAL: Good  CLINICAL DECISION MAKING: Stable/uncomplicated  EVALUATION COMPLEXITY: Low  GOALS: Goals reviewed with patient? Yes STG =LONG TERM GOALS: Target date: 07/08/2022     Pt/ will be independent in self MLD for long term management of lymphedema  A. Updated to include pts husband will be independent due to hand pain of pt. Baseline:  Goal status: In progress   2.  Pt will report decreased left breast pain by 50% or  more Baseline:  Goal status: MET 05/26/2022. Still gets occasional shooting pain 3.  Breast complaints survey no greater than 18 points Baseline:  Goal status:in progress  4.  Decreased breast swelling/fibrosis improved by 50% Baseline:  Goal status: in Progress 25% better PLAN:  PT FREQUENCY: 1-2x/week  PT DURATION:  1 more visit this week and 1 next week. Insurance ends Dec 31  PLANNED INTERVENTIONS: Therapeutic exercises, Therapeutic activity, Patient/Family education, Self Care, Orthotic/Fit training, Manual lymph drainage, scar mobilization, and Manual therapy  PLAN FOR NEXT SESSION:  continue Left Breast  MLD and review with pt, husband, Swell spot?,STM axillary border of pectorals   Claris Pong, PT 05/27/2022, 11:54 AM

## 2022-05-29 ENCOUNTER — Ambulatory Visit: Payer: Commercial Managed Care - HMO

## 2022-05-29 DIAGNOSIS — I890 Lymphedema, not elsewhere classified: Secondary | ICD-10-CM

## 2022-05-29 DIAGNOSIS — C50412 Malignant neoplasm of upper-outer quadrant of left female breast: Secondary | ICD-10-CM

## 2022-05-29 DIAGNOSIS — N644 Mastodynia: Secondary | ICD-10-CM

## 2022-05-29 NOTE — Therapy (Signed)
OUTPATIENT PHYSICAL THERAPY ONCOLOGY EVALUATION  Patient Name: Lisa [name] MRN: [id number] DOB:01/15/1961, 61 y.o., female Today's Date: 05/29/2022   PT End of Session - 05/29/22 1101     Visit Number 5    Number of Visits 6    Date for PT Re-Evaluation 06/08/22    Authorization - Visit Number 5    PT Start Time 1157    PT Stop Time 2620    PT Time Calculation (min) 42 min    Activity Tolerance Patient tolerated treatment well    Behavior During Therapy [facility] for tasks assessed/performed             Past Medical History:  Diagnosis Date   Anxiety    Arthritis    Breast cancer ([place]) 06/28/2021   lump found   Breast cancer ([place])    left breast [place]   Breast hematoma 12/02/2021   Breast mass, left 07/10/2021   Bulging disc L-5   Chicken pox    Depression    Diabetes mellitus    Hyperlipidemia    Hypertension    Insomnia    Lung nodule 04/22/2022   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    Meningitis 06/11/2021   bacterial meningitis   Otitis media 04/22/2022   Polycystic ovarian disease    Vaccine counseling 04/22/2022   Past Surgical History:  Procedure Laterality Date   BREAST LUMPECTOMY Left 08/2021   BREAST LUMPECTOMY WITH SENTINEL LYMPH NODE BIOPSY Left 08/23/2021   Procedure: LEFT BREAST LUMPECTOMY WITH SENTINEL LYMPH NODE BIOPSY;  Surgeon: Stark Klein, MD;  Location: [place];  Service: General;  Laterality: Left;   csf leak in skull  06/10/2003   noted failed attempt to close CSF leak in skull   DILATION AND EVACUATION  06/09/1988   miscarriage   IR FLUORO GUIDED NEEDLE PLC ASPIRATION/INJECTION LOC  06/12/2021   RADIOLOGY WITH ANESTHESIA N/A 06/12/2021   Procedure: IR WITH ANESTHESIA;  Surgeon: Radiologist, Medication, MD;  Location: [place];  Service: Radiology;  Laterality: N/A;   TONSILLECTOMY AND ADENOIDECTOMY  06/10/1971   Patient Active Problem List   Diagnosis Date Noted   Otitis media  04/22/2022   Seroma of breast 04/22/2022   Vaccine counseling 04/22/2022   Lung nodule 04/22/2022   Hyperglycemia due to type 2 diabetes mellitus ([place]) 01/21/2022   Degeneration of lumbar intervertebral disc 01/21/2022   Breast hematoma 12/02/2021   Genetic testing 08/22/2021   Family history of breast cancer 08/14/2021   Malignant neoplasm of upper-outer quadrant of left breast in female, estrogen receptor positive ([place]) 08/12/2021   Breast mass, left 07/10/2021   Fever    Bacterial meningitis    Right sided weakness    Pressure injury of skin 06/13/2021   Sepsis due to Haemophilus influenzae with acute hypoxic respiratory failure and septic shock ([place])    Acute bacterial meningitis 35/59/7416   Acute metabolic encephalopathy 38/45/3646   Altered mental status 06/12/2021   Obesity, morbid, BMI 40.0-49.9 ([place]) 03/24/2016   Family history of breast cancer in sister 10/21/2013   Tick bite of back 10/21/2012   Screening for malignant neoplasm of the cervix 05/19/2012   Routine gynecological examination 05/19/2012   Tobacco use 12/30/2011   Diabetes mellitus, type 2 ([place]) 12/29/2011   HTN (hypertension) 12/29/2011   Hyperlipidemia 12/29/2011   CSF leak 12/29/2011   Psoriasis 12/29/2011   PCOS (polycystic ovarian syndrome) 12/29/2011   Depression with anxiety 12/29/2011  PCP: Collene Leyden, MD  REFERRING PROVIDER: Stark Klein MD  REFERRING DIAG: Left Breast Pain  THERAPY DIAG:  Malignant neoplasm of upper-outer quadrant of left breast in female, estrogen receptor positive ([place])  Lymphedema, not elsewhere classified  Pain of left breast  ONSET DATE: 08/23/2021  Rationale for Evaluation and Treatment: Rehabilitation  SUBJECTIVE:                                                                                                                                                                                           SUBJECTIVE STATEMENT:  My husband didn't get to try the  MLD and he is having to go to [place] to work.  I tried the jacket yesterday but I think it makes me worse. I have an appt in January for disability. I hope it will come through. I have been wearing the pads in the bra. They do seem to help.  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 She had negative margins and 1/5 positive nodes for a final path of pT1cN1a. She received adjuvant radiation. She was going to start anastrozole, but didn't. She was seen on 09/06/2021 with pain and swelling in the axilla, thought to have a possible seroma in the axilla did not attempt aspiration due to history of bacterial meningitis and possible risk of infection. DM, depression, HTN, obesity, arthritis She returned to [place] 03/24/2022 with recurrent nipple drainage.  She had  PT for lymphedema and she got a flexi touch. She is doing manual lymph drainage once daily and the flexitouch once daily as well as wearing compression bra. She got mammogram and ultrasound that showed a 3.9 cm residual seroma that appeared to be non infected . She was advised to continue with compression bra and that fluid may come out until seroma is gone. Not a problem per MD as long as there is no infection.  PAIN:  Are you having pain? NO not presently NPRS scale: 0/10 Pain location: Left Breast Pain orientation: Left  PAIN TYPE: sharp Pain description: intermittent  Aggravating factors: When more swollen, PM Relieving factors: tramadol helps some  PRECAUTIONS: Left breast lymphedema, DM, HTN,OA, temporal bone malformation  WEIGHT BEARING RESTRICTIONS: [place] uses a walker for safety.  FALLS:  Has patient fallen in last 6 months? Yes. Number of falls 1-2  LIVING ENVIRONMENT: Lives with: husband, son, daughter daughter in law and 3 children Lives in:  House/apartment Stairs: Yes; pt lives on first floor. Only has 1 step driveway to  porch Has following equipment at home: Single point cane, Environmental consultant - 2 wheeled, Environmental consultant - 4 wheeled, Manufacturing engineer  OCCUPATION: unemployed  LEISURE: does not exercise, Had PT at [facility] for LE strengthening and right shoulder  HAND DOMINANCE: right   PRIOR LEVEL OF FUNCTION: Independent with community mobility with device  PATIENT GOALS: To find a way to decrease swelling/pain   OBJECTIVE:  COGNITION: Overall cognitive status: Within functional limits for tasks assessed   PALPATION: Fibrotic especially medial breast, with tenderness and at incision area. Mild swelling noted at lateral trunk, tender at left axillary border of pectoras  OBSERVATIONS / OTHER ASSESSMENTS: significant left nipple swelling, enlarged pores, medial breast fibrosis, redness, heat  SENSATION: Light touch: Appears intact   POSTURE: forward head, rounded shoulders  UPPER EXTREMITY AROM/PROM:  A/PROM RIGHT   eval Has right shoulder pain  Shoulder extension   Shoulder flexion 130  Shoulder abduction 125 +  Shoulder internal rotation   Shoulder external rotation     (Blank rows = not tested)  A/PROM LEFT   eval  Shoulder extension   Shoulder flexion 147 + axilla  Shoulder abduction 147 + shoulder  Shoulder internal rotation   Shoulder external rotation     (Blank rows = not tested)  CERVICAL AROM: All within normal limits:     UPPER EXTREMITY STRENGTH: WFL6.6  LYMPHEDEMA ASSESSMENTS:   SURGERY TYPE/DATE: L lumpectomy 08/23/21   NUMBER OF LYMPH NODES REMOVED: 1/5  CHEMOTHERAPY: NO  RADIATION:completed 2023  HORMONE TREATMENT: NO  INFECTIONS: possible infection of seroma Left breast at end of March 2023  LYMPHEDEMA ASSESSMENTS:   LANDMARK RIGHT  eval  10 cm proximal to olecranon process 33.6  Olecranon process 27.7  10 cm proximal to ulnar styloid process 23.5  Just proximal to ulnar styloid  process 17.7  Across hand at thumb web space 20.5  At base of 2nd digit 6.6  (Blank rows = not tested)  LANDMARK LEFT  eval  10 cm proximal to olecranon process 33.0  Olecranon process 27.0  10 cm proximal to ulnar styloid process 23.0  Just proximal to ulnar styloid process 17.9  Across hand at thumb web space 20.4  At base of 2nd digit 6.6  (Blank rows = not tested)     BREAST COMPLAINTS SURVEY  64 eval,   42 05/27/2022  TODAY'S TREATMENT:  05/29/2022  Continued Left breast MLD in supine with head of table elevated.  In supine: Short neck, 5 diaphragmatic breaths, R axillary nodes and establishment of interaxillary pathway, L inguinal nodes and establishment of axilloinguinal pathway, then L breast moving fluid towards pathways spending extra time in any areas of fibrosis then retracing all steps. STM performed to fibrotic areas prior to and during MLD.  05/27/2022 Continued Left breast MLD in supine with head of table elevated. Performed MLD steps and had pts husband perform each step after my demonstration In supine: Short neck, 5 diaphragmatic breaths, R axillary nodes and establishment of interaxillary pathway, L inguinal nodes and establishment of axilloinguinal pathway, then L breast moving fluid towards pathways spending extra time in any areas of fibrosis then retracing all steps. STM performed to fibrotic areas prior to and during MLD. Showed pts. Husband  [employer] Swell spot for full bra and Breast on Compression guru and gave them Website name to see if she wants to purchase to help with fibrosis   05/14/2022 Continued Left breast MLD in supine with head of table elevated In supine: Short neck,  5 diaphragmatic breaths, R axillary nodes and establishment of interaxillary pathway, L inguinal nodes and establishment of axilloinguinal pathway, then L breast moving fluid towards pathways spending extra time in any areas of fibrosis then retracing all steps. STM performed to  fibrotic areas prior to and during MLD. Showed pt [employer] Swell spot for full bra and Breast on Compression guru and gave pt Website name to see if she wants to purchase to help with fibrosis  05/09/2022 Initiated left breast MLD in supine with head of table elevated In supine: Short neck, 5 diaphragmatic breaths, R axillary nodes and establishment of interaxillary pathway, L inguinal nodes and establishment of axilloinguinal pathway, then L breast moving fluid towards pathways spending extra time in any areas of fibrosis then retracing all steps. STM performed to fibrotic areas prior to and during MLD Large chip pack made for pt to try in compression bra to decrease fibrosis; She will try for a short period to be sure it is comfortable.                                                                                                                                         DATE:04/15/2022 No treatment provided due to concerns of infection. Messaged MD and CMA and placed photo in media. Requested a follow up visit for pt prior to [place] beginning treatment   PATIENT EDUCATION:  Education details: pt. Person educated: Patient Education method: Explanation Education comprehension: verbalized understanding  HOME EXERCISE PROGRAM: None given  ASSESSMENT:  CLINICAL IMPRESSION:  Pt continues with extensive fibrosis especially at the medial breast that is difficult to soften. Pt husband has not practiced the MLD on her, and pt continues to have bilateral hand pain. She indicates the Flexi touch is making the swelling worse. Advised her to call flexi and have them come out again if needed.  OBJECTIVE IMPAIRMENTS: increased edema and pain.   ACTIVITY LIMITATIONS: lifting, bed mobility, and reach over head  PARTICIPATION LIMITATIONS: able to do what is required of her but with pain  PERSONAL FACTORS: 3+ comorbidities: Left breast cancer s/p radiation, DM,Bacterial meningitis  are also affecting patient's  functional outcome.   REHAB POTENTIAL: Good  CLINICAL DECISION MAKING: Stable/uncomplicated  EVALUATION COMPLEXITY: Low  GOALS: Goals reviewed with patient? Yes STG =LONG TERM GOALS: Target date: 07/10/2022     Pt/ will be independent in self MLD for long term management of lymphedema  A. Updated to include pts husband will be independent due to hand pain of pt. Baseline:  Goal status: In progress   2.  Pt will report decreased left breast pain by 50% or more Baseline:  Goal status: MET 05/26/2022. Still gets occasional shooting pain 3.  Breast complaints survey no greater than 18 points Baseline:  Goal status:in progress  4.  Decreased breast swelling/fibrosis improved by 50% Baseline:  Goal status: in Progress 25% better PLAN:  PT FREQUENCY: 1-2x/week  PT DURATION:  1 more visit next week. Insurance ends Dec 31  PLANNED INTERVENTIONS: Therapeutic exercises, Therapeutic activity, Patient/Family education, Self Care, Orthotic/Fit training, Manual lymph drainage, scar mobilization, and Manual therapy  PLAN FOR NEXT SESSION:  continue Left Breast  MLD and review with pt, husband, Swell spot?,STM axillary border of pectorals   Claris Pong, PT 05/29/2022, 11:50 AM

## 2022-06-03 ENCOUNTER — Other Ambulatory Visit (HOSPITAL_BASED_OUTPATIENT_CLINIC_OR_DEPARTMENT_OTHER): Payer: Self-pay

## 2022-06-03 ENCOUNTER — Other Ambulatory Visit: Payer: Self-pay

## 2022-06-04 ENCOUNTER — Other Ambulatory Visit: Payer: Self-pay

## 2022-06-04 ENCOUNTER — Ambulatory Visit: Payer: Commercial Managed Care - HMO

## 2022-06-04 ENCOUNTER — Other Ambulatory Visit (HOSPITAL_BASED_OUTPATIENT_CLINIC_OR_DEPARTMENT_OTHER): Payer: Self-pay

## 2022-06-11 ENCOUNTER — Other Ambulatory Visit (HOSPITAL_BASED_OUTPATIENT_CLINIC_OR_DEPARTMENT_OTHER): Payer: Self-pay

## 2022-06-11 MED ORDER — SPIRONOLACTONE 25 MG PO TABS
25.0000 mg | ORAL_TABLET | Freq: Every day | ORAL | 0 refills | Status: AC
  Filled 2022-06-11: qty 90, 90d supply, fill #0

## 2022-06-11 MED ORDER — ESZOPICLONE 3 MG PO TABS
3.0000 mg | ORAL_TABLET | Freq: Every day | ORAL | 0 refills | Status: DC
  Filled 2022-06-11: qty 30, 30d supply, fill #0

## 2022-06-16 ENCOUNTER — Ambulatory Visit: Payer: Commercial Managed Care - HMO | Attending: General Surgery

## 2022-06-16 VITALS — Wt 266.5 lb

## 2022-06-16 DIAGNOSIS — N644 Mastodynia: Secondary | ICD-10-CM | POA: Insufficient documentation

## 2022-06-16 DIAGNOSIS — Z483 Aftercare following surgery for neoplasm: Secondary | ICD-10-CM | POA: Insufficient documentation

## 2022-06-16 DIAGNOSIS — Z170 Estrogen receptor positive status [ER+]: Secondary | ICD-10-CM | POA: Insufficient documentation

## 2022-06-16 DIAGNOSIS — I890 Lymphedema, not elsewhere classified: Secondary | ICD-10-CM | POA: Insufficient documentation

## 2022-06-16 DIAGNOSIS — C50412 Malignant neoplasm of upper-outer quadrant of left female breast: Secondary | ICD-10-CM | POA: Insufficient documentation

## 2022-06-16 NOTE — Therapy (Signed)
OUTPATIENT PHYSICAL THERAPY SOZO SCREENING NOTE   Patient Name: Lisa [name] MRN: [id number] DOB:08-07-1960, 62 y.o., female Today's Date: 06/16/2022  PCP: Collene Leyden, MD REFERRING PROVIDER: Stark Klein, MD   PT End of Session - 06/16/22 1043     Visit Number 5   # unchanged due to screen only   PT Start Time 1040    PT Stop Time 1047    PT Time Calculation (min) 7 min    Activity Tolerance Patient tolerated treatment well    Behavior During Therapy [facility] for tasks assessed/performed             Past Medical History:  Diagnosis Date   Anxiety    Arthritis    Breast cancer ([place]) 06/28/2021   lump found   Breast cancer ([place])    left breast [place]   Breast hematoma 12/02/2021   Breast mass, left 07/10/2021   Bulging disc L-5   Chicken pox    Depression    Diabetes mellitus    Hyperlipidemia    Hypertension    Insomnia    Lung nodule 04/22/2022   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    Meningitis 06/11/2021   bacterial meningitis   Otitis media 04/22/2022   Polycystic ovarian disease    Vaccine counseling 04/22/2022   Past Surgical History:  Procedure Laterality Date   BREAST LUMPECTOMY Left 08/2021   BREAST LUMPECTOMY WITH SENTINEL LYMPH NODE BIOPSY Left 08/23/2021   Procedure: LEFT BREAST LUMPECTOMY WITH SENTINEL LYMPH NODE BIOPSY;  Surgeon: Stark Klein, MD;  Location: [place];  Service: General;  Laterality: Left;   csf leak in skull  06/10/2003   noted failed attempt to close CSF leak in skull   DILATION AND EVACUATION  06/09/1988   miscarriage   IR FLUORO GUIDED NEEDLE PLC ASPIRATION/INJECTION LOC  06/12/2021   RADIOLOGY WITH ANESTHESIA N/A 06/12/2021   Procedure: IR WITH ANESTHESIA;  Surgeon: Radiologist, Medication, MD;  Location: [place];  Service: Radiology;  Laterality: N/A;   TONSILLECTOMY AND ADENOIDECTOMY  06/10/1971   Patient Active Problem List   Diagnosis Date Noted   Otitis media  04/22/2022   Seroma of breast 04/22/2022   Vaccine counseling 04/22/2022   Lung nodule 04/22/2022   Hyperglycemia due to type 2 diabetes mellitus ([place]) 01/21/2022   Degeneration of lumbar intervertebral disc 01/21/2022   Breast hematoma 12/02/2021   Genetic testing 08/22/2021   Family history of breast cancer 08/14/2021   Malignant neoplasm of upper-outer quadrant of left breast in female, estrogen receptor positive ([place]) 08/12/2021   Breast mass, left 07/10/2021   Fever    Bacterial meningitis    Right sided weakness    Pressure injury of skin 06/13/2021   Sepsis due to Haemophilus influenzae with acute hypoxic respiratory failure and septic shock ([place])    Acute bacterial meningitis 22/29/7989   Acute metabolic encephalopathy 21/19/4174   Altered mental status 06/12/2021   Obesity, morbid, BMI 40.0-49.9 ([place]) 03/24/2016   Family history of breast cancer in sister 10/21/2013   Tick bite of back 10/21/2012   Screening for malignant neoplasm of the cervix 05/19/2012   Routine gynecological examination 05/19/2012   Tobacco use 12/30/2011   Diabetes mellitus, type 2 ([place]) 12/29/2011   HTN (hypertension) 12/29/2011   Hyperlipidemia 12/29/2011   CSF leak 12/29/2011   Psoriasis 12/29/2011   PCOS (polycystic ovarian syndrome) 12/29/2011   Depression with anxiety 12/29/2011    REFERRING DIAG:  left breast cancer at risk for lymphedema  THERAPY DIAG:  Aftercare following surgery for neoplasm  PERTINENT HISTORY: Malignant neoplasm of upper-outer quadrant of left breast, invasive ductal carcinoma, stage IA, c(T1c, 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 She had negative margins and 1/5 positive nodes for a final path of pT1cN1a. She received adjuvant radiation. She was going to start anastrozole, but didn't. She was seen on 09/06/2021 with pain and swelling in the axilla, thought to have a possible seroma in the axilla did not attempt  aspiration due to history of bacterial meningitis and possible risk of infection. DM, depression, HTN, obesity, arthritis She returned to [place] 03/24/2022 with recurrent nipple drainage.  She had  PT for lymphedema and she got a flexi touch. She is doing manual lymph drainage once daily and the flexitouch once daily as well as wearing compression bra. She got mammogram and ultrasound that showed a 3.9 cm residual seroma that appeared to be non infected . She was advised to continue with compression bra and that fluid may come out until seroma is gone. Not a problem per MD as long as there is no infection.  PRECAUTIONS: left UE Lymphedema risk, None  SUBJECTIVE: Pt returns for her 3 month L-Dex screen.   PAIN:  Are you having pain? No  SOZO SCREENING: Patient was assessed today using the SOZO machine to determine the lymphedema index score. This was compared to her baseline score. It was determined that she is within the recommended range when compared to her baseline and no further action is needed at this time. She will continue SOZO screenings. These are done every 3 months for 2 years post operatively followed by every 6 months for 2 years, and then annually.   L-DEX FLOWSHEETS - 06/16/22 1000       L-DEX LYMPHEDEMA SCREENING   Measurement Type Unilateral    L-DEX MEASUREMENT EXTREMITY Upper Extremity    POSITION  Standing    DOMINANT SIDE Right    At Risk Side Left    BASELINE SCORE (UNILATERAL) 5.6    L-DEX SCORE (UNILATERAL) 8.8    VALUE CHANGE (UNILAT) 3.2              Otelia Limes, PTA 06/16/2022, 10:47 AM

## 2022-06-25 ENCOUNTER — Ambulatory Visit: Payer: Commercial Managed Care - HMO

## 2022-06-25 ENCOUNTER — Other Ambulatory Visit (HOSPITAL_BASED_OUTPATIENT_CLINIC_OR_DEPARTMENT_OTHER): Payer: Self-pay

## 2022-06-25 DIAGNOSIS — I890 Lymphedema, not elsewhere classified: Secondary | ICD-10-CM

## 2022-06-25 DIAGNOSIS — Z483 Aftercare following surgery for neoplasm: Secondary | ICD-10-CM | POA: Diagnosis not present

## 2022-06-25 DIAGNOSIS — C50412 Malignant neoplasm of upper-outer quadrant of left female breast: Secondary | ICD-10-CM | POA: Diagnosis present

## 2022-06-25 DIAGNOSIS — N644 Mastodynia: Secondary | ICD-10-CM | POA: Diagnosis present

## 2022-06-25 DIAGNOSIS — Z170 Estrogen receptor positive status [ER+]: Secondary | ICD-10-CM | POA: Diagnosis present

## 2022-06-25 MED ORDER — DICLOFENAC SODIUM 75 MG PO TBEC
75.0000 mg | DELAYED_RELEASE_TABLET | Freq: Two times a day (BID) | ORAL | 0 refills | Status: AC
  Filled 2022-06-25: qty 180, 90d supply, fill #0

## 2022-06-25 NOTE — Therapy (Signed)
OUTPATIENT PHYSICAL THERAPY ONCOLOGY EVALUATION  Patient Name: Lisa [name] MRN: [id number] DOB:[birth year], 62 y.o., female Today's Date: 06/25/2022   PT End of Session - 06/25/22 1202     Visit Number 6    Number of Visits 14    Date for PT Re-Evaluation 07/23/22    PT Start Time 8588    PT Stop Time 5027    PT Time Calculation (min) 43 min    Activity Tolerance Patient tolerated treatment well    Behavior During Therapy [facility] for tasks assessed/performed             Past Medical History:  Diagnosis Date   Anxiety    Arthritis    Breast cancer ([place]) 06/28/2021   lump found   Breast cancer ([place])    left breast [place]   Breast hematoma 12/02/2021   Breast mass, left 07/10/2021   Bulging disc L-5   Chicken pox    Depression    Diabetes mellitus    Hyperlipidemia    Hypertension    Insomnia    Lung nodule 04/22/2022   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    Meningitis 06/11/2021   bacterial meningitis   Otitis media 04/22/2022   Polycystic ovarian disease    Vaccine counseling 04/22/2022   Past Surgical History:  Procedure Laterality Date   BREAST LUMPECTOMY Left 08/2021   BREAST LUMPECTOMY WITH SENTINEL LYMPH NODE BIOPSY Left 08/23/2021   Procedure: LEFT BREAST LUMPECTOMY WITH SENTINEL LYMPH NODE BIOPSY;  Surgeon: Stark Klein, MD;  Location: [place];  Service: General;  Laterality: Left;   csf leak in skull  06/10/2003   noted failed attempt to close CSF leak in skull   DILATION AND EVACUATION  06/09/1988   miscarriage   IR FLUORO GUIDED NEEDLE PLC ASPIRATION/INJECTION LOC  06/12/2021   RADIOLOGY WITH ANESTHESIA N/A 06/12/2021   Procedure: IR WITH ANESTHESIA;  Surgeon: Radiologist, Medication, MD;  Location: [place];  Service: Radiology;  Laterality: N/A;   TONSILLECTOMY AND ADENOIDECTOMY  06/10/1971   Patient Active Problem List   Diagnosis Date Noted   Otitis media 04/22/2022   Seroma of breast  04/22/2022   Vaccine counseling 04/22/2022   Lung nodule 04/22/2022   Hyperglycemia due to type 2 diabetes mellitus ([place]) 01/21/2022   Degeneration of lumbar intervertebral disc 01/21/2022   Breast hematoma 12/02/2021   Genetic testing 08/22/2021   Family history of breast cancer 08/14/2021   Malignant neoplasm of upper-outer quadrant of left breast in female, estrogen receptor positive ([place]) 08/12/2021   Breast mass, left 07/10/2021   Fever    Bacterial meningitis    Right sided weakness    Pressure injury of skin 06/13/2021   Sepsis due to Haemophilus influenzae with acute hypoxic respiratory failure and septic shock ([place])    Acute bacterial meningitis 74/05/8785   Acute metabolic encephalopathy 76/72/0947   Altered mental status 06/12/2021   Obesity, morbid, BMI 40.0-49.9 ([place]) 03/24/2016   Family history of breast cancer in sister 10/21/2013   Tick bite of back 10/21/2012   Screening for malignant neoplasm of the cervix 05/19/2012   Routine gynecological examination 05/19/2012   Tobacco use 12/30/2011   Diabetes mellitus, type 2 ([place]) 12/29/2011   HTN (hypertension) 12/29/2011   Hyperlipidemia 12/29/2011   CSF leak 12/29/2011   Psoriasis 12/29/2011   PCOS (polycystic ovarian syndrome) 12/29/2011   Depression with anxiety 12/29/2011    PCP: Collene Leyden, MD  REFERRING  PROVIDER: Stark Klein MD  REFERRING DIAG: Left Breast Pain  THERAPY DIAG:  Aftercare following surgery for neoplasm  Malignant neoplasm of upper-outer quadrant of left breast in female, estrogen receptor positive ([place])  Lymphedema, not elsewhere classified  Pain of left breast  ONSET DATE: 08/23/2021  Rationale for Evaluation and Treatment: Rehabilitation  SUBJECTIVE:                                                                                                                                                                                           SUBJECITIVE STATEMENT:    I have worn my bra,  the pads and done the self massage and that helps and it seems softer on the outside. I feel like its some better than it was. I feel like the seroma is gone now and I am not having pain. It still gets very firm and the nipple is still swollen. I can't get into our bed with the flexitouch on because the bed is too high, and I dont have a recliner I can use. The jacket rides up on me.    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 She had negative margins and 1/5 positive nodes for a final path of pT1cN1a. She received adjuvant radiation. She was going to start anastrozole, but didn't. She was seen on 09/06/2021 with pain and swelling in the axilla, thought to have a possible seroma in the axilla did not attempt aspiration due to history of bacterial meningitis and possible risk of infection. DM, depression, HTN, obesity, arthritis She returned to [place] 03/24/2022 with recurrent nipple drainage.  She had  PT for lymphedema and she got a flexi touch. She is doing manual lymph drainage once daily and the flexitouch once daily as well as wearing compression bra. She got mammogram and ultrasound that showed a 3.9 cm residual seroma that appeared to be non infected . She was advised to continue with compression bra and that fluid may come out until seroma is gone. Not a problem per MD as long as there is no infection.  PAIN:  Are you having pain? NO not presently NPRS scale: 0/10 Pain location: Left Breast Pain orientation: Left  PAIN TYPE: sharp Pain description: intermittent  Aggravating factors: When more swollen, PM Relieving factors: tramadol helps some  PRECAUTIONS: Left breast lymphedema, DM, HTN,OA, temporal bone malformation  WEIGHT BEARING RESTRICTIONS: [place] uses a walker for safety.  FALLS:  Has patient fallen in last 6  months? Yes. Number of  falls 1-2  LIVING ENVIRONMENT: Lives with: husband, son, daughter daughter in law and 3 children Lives in: House/apartment Stairs: Yes; pt lives on first floor. Only has 1 step driveway to porch Has following equipment at home: Single point cane, Environmental consultant - 2 wheeled, Environmental consultant - 4 wheeled, Manufacturing engineer  OCCUPATION: unemployed  LEISURE: does not exercise, Had PT at [facility] for LE strengthening and right shoulder  HAND DOMINANCE: right   PRIOR LEVEL OF FUNCTION: Independent with community mobility with device  PATIENT GOALS: To find a way to decrease swelling/pain   OBJECTIVE:  COGNITION: Overall cognitive status: Within functional limits for tasks assessed   PALPATION: Fibrotic especially medial breast, with tenderness and at incision area. Mild swelling noted at lateral trunk, tender at left axillary border of pectoras  OBSERVATIONS / OTHER ASSESSMENTS: significant left nipple swelling, enlarged pores, medial and lateralbreast fibrosis, redness, heat  SENSATION: Light touch: Appears intact   POSTURE: forward head, rounded shoulders  UPPER EXTREMITY AROM/PROM:  A/PROM RIGHT   eval Has right shoulder pain  Shoulder extension   Shoulder flexion 130  Shoulder abduction 125 +  Shoulder internal rotation   Shoulder external rotation     (Blank rows = not tested)  A/PROM LEFT   eval  Shoulder extension   Shoulder flexion 147 + axilla  Shoulder abduction 147 + shoulder  Shoulder internal rotation   Shoulder external rotation     (Blank rows = not tested)  CERVICAL AROM: All within normal limits:     UPPER EXTREMITY STRENGTH:  LYMPHEDEMA ASSESSMENTS:   SURGERY TYPE/DATE: L lumpectomy 08/23/21   NUMBER OF LYMPH NODES REMOVED: 1/5  CHEMOTHERAPY: NO  RADIATION:completed 2023  HORMONE TREATMENT: NO  INFECTIONS: possible infection of seroma Left breast at end of March 2023  LYMPHEDEMA ASSESSMENTS:   LANDMARK RIGHT  eval  10 cm  proximal to olecranon process 33.6  Olecranon process 27.7  10 cm proximal to ulnar styloid process 23.5  Just proximal to ulnar styloid process 17.7  Across hand at thumb web space 20.5  At base of 2nd digit 6.6  (Blank rows = not tested)  LANDMARK LEFT  eval  10 cm proximal to olecranon process 33.0  Olecranon process 27.0  10 cm proximal to ulnar styloid process 23.0  Just proximal to ulnar styloid process 17.9  Across hand at thumb web space 20.4  At base of 2nd digit 6.6  (Blank rows = not tested)     BREAST COMPLAINTS SURVEY  64 eval,   42 05/27/2022  TODAY'S TREATMENT:   06/25/2022  Continued Left breast MLD in supine with head of table elevated. Performed MLD steps and had pts husband perform each step after my demonstration In supine: Short neck, 5 diaphragmatic breaths, R axillary nodes and establishment of interaxillary pathway, L inguinal nodes and establishment of axilloinguinal pathway, then L breast moving fluid towards pathways spending extra time in any areas of fibrosis then retracing all steps. STM performed to fibrotic areas prior to and during MLD.    05/29/2022  Continued Left breast MLD in supine with head of table elevated.  In supine: Short neck, 5 diaphragmatic breaths, R axillary nodes and establishment of interaxillary pathway, L inguinal nodes and establishment of axilloinguinal pathway, then L breast moving fluid towards pathways spending extra time in any areas of fibrosis then retracing all steps. STM performed to fibrotic areas prior to and during MLD.  05/27/2022 Continued Left breast MLD in supine with head of table elevated.  Performed MLD steps and had pts husband perform each step after my demonstration In supine: Short neck, 5 diaphragmatic breaths, R axillary nodes and establishment of interaxillary pathway, L inguinal nodes and establishment of axilloinguinal pathway, then L breast moving fluid towards pathways spending extra time in any  areas of fibrosis then retracing all steps. STM performed to fibrotic areas prior to and during MLD. Showed pts. Husband  [employer] Swell spot for full bra and Breast on Compression guru and gave them Website name to see if she wants to purchase to help with fibrosis   05/14/2022 Continued Left breast MLD in supine with head of table elevated In supine: Short neck, 5 diaphragmatic breaths, R axillary nodes and establishment of interaxillary pathway, L inguinal nodes and establishment of axilloinguinal pathway, then L breast moving fluid towards pathways spending extra time in any areas of fibrosis then retracing all steps. STM performed to fibrotic areas prior to and during MLD. Showed pt [employer] Swell spot for full bra and Breast on Compression guru and gave pt Website name to see if she wants to purchase to help with fibrosis  05/09/2022 Initiated left breast MLD in supine with head of table elevated In supine: Short neck, 5 diaphragmatic breaths, R axillary nodes and establishment of interaxillary pathway, L inguinal nodes and establishment of axilloinguinal pathway, then L breast moving fluid towards pathways spending extra time in any areas of fibrosis then retracing all steps. STM performed to fibrotic areas prior to and during MLD Large chip pack made for pt to try in compression bra to decrease fibrosis; She will try for a short period to be sure it is comfortable.                                                                                                                                         DATE:04/15/2022 No treatment provided due to concerns of infection. Messaged MD and CMA and placed photo in media. Requested a follow up visit for pt prior to [place] beginning treatment   PATIENT EDUCATION:  Education details: pt. Person educated: Patient Education method: Explanation Education comprehension: verbalized understanding  HOME EXERCISE PROGRAM: None given  ASSESSMENT:  CLINICAL  IMPRESSION: Pt had not been seen in therapy since 05/30/2023 because she had run out of insurance visits. She returned today with her husband and therapist demonstrated techniques and then had her husband perform. He did exceptionally well using good pressure and technique. Fibrotic areas were softer. Area of seroma is gone and pain has resolved except for occasional shooting pain. She will continue to benefit from skilled PT to address deficits and to help problem solve.  OBJECTIVE IMPAIRMENTS: increased edema and pain.   ACTIVITY LIMITATIONS: lifting, bed mobility, and reach over head  PARTICIPATION LIMITATIONS: able to do what is required of her but with pain  PERSONAL FACTORS: 3+ comorbidities: Left breast cancer s/p radiation, DM,Bacterial meningitis  are also affecting patient's functional outcome.   REHAB POTENTIAL: Good  CLINICAL DECISION MAKING: Stable/uncomplicated  EVALUATION COMPLEXITY: Low  GOALS: Goals reviewed with patient? Yes STG =LONG TERM GOALS: Target date: 08/06/2022     Pt/ will be independent in self MLD for long term management of lymphedema  A. Updated to include pts husband will be independent due to hand pain of pt. Baseline:  Goal status: In progress/ pt limited by bilateral hand pain   2.  Pt will report decreased left breast pain by 50% or more Baseline:  Goal status: MET 05/26/2022. Still gets occasional shooting pain 3.  Breast complaints survey no greater than 18 points Baseline:  Goal status:in progress  4.  Decreased breast swelling/fibrosis improved by 50% Baseline:  Goal status: in Progress 25% better PLAN:  PT FREQUENCY: 1-2x/week  PT DURATION:  4 weeks  PLANNED INTERVENTIONS: Therapeutic exercises, Therapeutic activity, Patient/Family education, Self Care, Orthotic/Fit training, Manual lymph drainage, scar mobilization, and Manual therapy  PLAN FOR NEXT SESSION:  continue Left Breast  MLD and review with pt, husband, Swell spot?,STM  axillary border of pectorals, have they tried Flexi again, or do we need Flexi to go out again?   Claris Pong, PT 06/25/2022, 12:54 PM

## 2022-07-02 ENCOUNTER — Ambulatory Visit: Payer: Commercial Managed Care - HMO

## 2022-07-02 ENCOUNTER — Other Ambulatory Visit: Payer: Self-pay | Admitting: Family Medicine

## 2022-07-02 ENCOUNTER — Ambulatory Visit
Admission: RE | Admit: 2022-07-02 | Discharge: 2022-07-02 | Disposition: A | Discharge status: Home / Self Care | Payer: Commercial Managed Care - HMO | Source: Ambulatory Visit | Attending: Family Medicine | Admitting: Family Medicine

## 2022-07-02 DIAGNOSIS — M5440 Lumbago with sciatica, unspecified side: Secondary | ICD-10-CM

## 2022-07-02 DIAGNOSIS — M542 Cervicalgia: Secondary | ICD-10-CM

## 2022-07-04 ENCOUNTER — Other Ambulatory Visit (HOSPITAL_BASED_OUTPATIENT_CLINIC_OR_DEPARTMENT_OTHER): Payer: Self-pay

## 2022-07-04 ENCOUNTER — Other Ambulatory Visit: Payer: Self-pay

## 2022-07-08 ENCOUNTER — Other Ambulatory Visit (HOSPITAL_BASED_OUTPATIENT_CLINIC_OR_DEPARTMENT_OTHER): Payer: Self-pay

## 2022-07-08 ENCOUNTER — Other Ambulatory Visit: Payer: Self-pay

## 2022-07-08 MED ORDER — EZETIMIBE 10 MG PO TABS
10.0000 mg | ORAL_TABLET | Freq: Every day | ORAL | 0 refills | Status: AC
  Filled 2022-07-08: qty 60, 60d supply, fill #0

## 2022-07-09 ENCOUNTER — Other Ambulatory Visit (HOSPITAL_BASED_OUTPATIENT_CLINIC_OR_DEPARTMENT_OTHER): Payer: Self-pay

## 2022-07-10 ENCOUNTER — Other Ambulatory Visit (HOSPITAL_BASED_OUTPATIENT_CLINIC_OR_DEPARTMENT_OTHER): Payer: Self-pay

## 2022-07-10 MED ORDER — DICLOFENAC SODIUM 1 % EX GEL
CUTANEOUS | 5 refills | Status: AC
  Filled 2022-07-10: qty 100, 25d supply, fill #0

## 2022-07-10 MED ORDER — ESZOPICLONE 3 MG PO TABS
3.0000 mg | ORAL_TABLET | Freq: Every day | ORAL | 0 refills | Status: AC
  Filled 2022-07-10 – 2022-07-11 (×2): qty 30, 30d supply, fill #0

## 2022-07-11 ENCOUNTER — Other Ambulatory Visit (HOSPITAL_BASED_OUTPATIENT_CLINIC_OR_DEPARTMENT_OTHER): Payer: Self-pay

## 2022-07-23 ENCOUNTER — Other Ambulatory Visit: Payer: Self-pay | Admitting: Rheumatology

## 2022-07-23 ENCOUNTER — Ambulatory Visit
Admission: RE | Admit: 2022-07-23 | Discharge: 2022-07-23 | Disposition: A | Discharge status: Home / Self Care | Payer: Commercial Managed Care - HMO | Source: Ambulatory Visit | Attending: Rheumatology | Admitting: Rheumatology

## 2022-07-23 DIAGNOSIS — L4050 Arthropathic psoriasis, unspecified: Secondary | ICD-10-CM

## 2022-08-04 ENCOUNTER — Encounter: Payer: Self-pay | Admitting: Infectious Disease

## 2022-08-04 ENCOUNTER — Telehealth: Payer: Self-pay

## 2022-08-04 ENCOUNTER — Emergency Department (HOSPITAL_COMMUNITY): Payer: Commercial Managed Care - HMO

## 2022-08-04 ENCOUNTER — Emergency Department (HOSPITAL_COMMUNITY)
Admission: EM | Admit: 2022-08-04 | Discharge: 2022-08-04 | Disposition: A | Discharge status: Home / Self Care | Payer: Commercial Managed Care - HMO | Attending: Emergency Medicine | Admitting: Emergency Medicine

## 2022-08-04 DIAGNOSIS — R519 Headache, unspecified: Secondary | ICD-10-CM | POA: Diagnosis present

## 2022-08-04 LAB — COMPREHENSIVE METABOLIC PANEL
ALT: 15 U/L (ref 0–44)
AST: 17 U/L (ref 15–41)
Albumin: 3.7 g/dL (ref 3.5–5.0)
Alkaline Phosphatase: 57 U/L (ref 38–126)
Anion gap: 12 (ref 5–15)
BUN: 12 mg/dL (ref 8–23)
CO2: 22 mmol/L (ref 22–32)
Calcium: 9.2 mg/dL (ref 8.9–10.3)
Chloride: 100 mmol/L (ref 98–111)
Creatinine, Ser: 0.74 mg/dL (ref 0.44–1.00)
GFR, Estimated: >60 mL/min (ref >60)
Glucose, Bld: 159 mg/dL — ABNORMAL HIGH (ref 70–99)
Potassium: 3.9 mmol/L (ref 3.5–5.1)
Sodium: 134 mmol/L — ABNORMAL LOW (ref 135–145)
Total Bilirubin: 0.4 mg/dL (ref 0.3–1.2)
Total Protein: 6.8 g/dL (ref 6.5–8.1)

## 2022-08-04 LAB — CBC WITH DIFFERENTIAL/PLATELET
Abs Immature Granulocytes: 0.02 10*3/uL (ref 0.00–0.07)
Basophils Absolute: 0.1 10*3/uL (ref 0.0–0.1)
Basophils Relative: 1 %
Eosinophils Absolute: 0.4 10*3/uL (ref 0.0–0.5)
Eosinophils Relative: 6 %
HCT: 44.6 % (ref 36.0–46.0)
Hemoglobin: 15 g/dL (ref 12.0–15.0)
Immature Granulocytes: 0 %
Lymphocytes Relative: 25 %
Lymphs Abs: 1.9 10*3/uL (ref 0.7–4.0)
MCH: 31.3 pg (ref 26.0–34.0)
MCHC: 33.6 g/dL (ref 30.0–36.0)
MCV: 93.1 fL (ref 80.0–100.0)
Monocytes Absolute: 0.8 10*3/uL (ref 0.1–1.0)
Monocytes Relative: 10 %
Neutro Abs: 4.6 10*3/uL (ref 1.7–7.7)
Neutrophils Relative %: 58 %
Platelets: 276 10*3/uL (ref 150–400)
RBC: 4.79 MIL/uL (ref 3.87–5.11)
RDW: 13.4 % (ref 11.5–15.5)
WBC: 7.9 10*3/uL (ref 4.0–10.5)
nRBC: 0 % (ref 0.0–0.2)

## 2022-08-04 NOTE — ED Triage Notes (Signed)
Patient here with complaint of facial pain and "CSF leaking from her nose", history of bacterial meningitis. Patient is alert, oriented, and in no apparent distress at this time.

## 2022-08-04 NOTE — ED Provider Notes (Signed)
[facility] Provider Note   CSN: [id number] Arrival date & time: 08/04/22  1340     History  Chief Complaint  Patient presents with   Facial Pain    Lisa [name] is a 62 y.o. female history of Marfan's, bacterial meningitis, CSF leakage presented with right-sided facial pain that began 2 days ago.  Patient has been taking Augmentin as per her ID specialist.  Patient states the pain on her right side is affecting her jaw and face.  Patient states she has chronic pain on her face.  Patient states she has seen ENT in the past and they have not helped.  Patient denies chest pain, shortness of breath, vision changes, headaches, recent trauma, fevers, nausea/vomiting, abdominal pain, dysuria Home Medications Prior to Admission medications   Medication Sig Start Date End Date Taking? Authorizing Provider  albuterol (VENTOLIN HFA) 108 (90 Base) MCG/ACT inhaler Inhale 2 puffs into the lungs every 6 (six) hours as needed for wheezing. 07/01/21   Angiulli, Lavon Paganini, PA-C  ALPRAZolam Duanne Moron) 0.25 MG tablet Take 1/2 to 1 tablet by mouth daily as needed for anxiety 02/26/22     ALPRAZolam (XANAX) 0.25 MG tablet Take 0.5-1 tablets (0.125-0.25 mg total) by mouth daily as needed. 05/26/22     Amoxicillin-Pot Clavulanate (AUGMENTIN PO) Take by mouth.    [provider]  aspirin EC 81 MG tablet Take 81 mg by mouth daily. Swallow whole.    [provider]  bisacodyl 5 MG EC tablet Take by mouth as directed 02/18/22     buPROPion (WELLBUTRIN XL) 300 MG 24 hr tablet Take 1 tablet by mouth every morning.    [provider]  buPROPion (WELLBUTRIN XL) 300 MG 24 hr tablet Take 1 tablet by mouth in the morning 01/22/22     clobetasol ointment (TEMOVATE) [id number] % Apply 1 application externally twice a day for 10 day(s) 01/29/22     cyclobenzaprine (FLEXERIL) 5 MG tablet Take 1 tablet (5 mg total) by mouth at bedtime as needed. 03/17/22      dapagliflozin propanediol (FARXIGA) 5 MG TABS tablet Take 1 tablet by mouth once a day 09/06/21     diclofenac (VOLTAREN) 75 MG EC tablet Take 1 tablet by mouth 2 times daily as needed 10/01/21     diclofenac (VOLTAREN) 75 MG EC tablet Take 1 tablet (75 mg total) by mouth 2 (two) times daily as needed. 03/18/22     diclofenac (VOLTAREN) 75 MG EC tablet Take 1 tablet (75 mg total) by mouth in the morning and at bedtime. 06/25/22     diclofenac Sodium (VOLTAREN) 1 % GEL Apply 4 g topically every 6 (six) hours as needed (Shoulder pain). 07/01/21   Angiulli, Lavon Paganini, PA-C  diclofenac Sodium (VOLTAREN) 1 % GEL Apply 4 grams topically to lower extremity joint every 6 (six) hours. 03/19/22     diclofenac Sodium (VOLTAREN) 1 % GEL Apply as directed externally. 07/10/22     Eszopiclone 3 MG TABS Take 1 tablet (3 mg total) by mouth at bedtime. 07/10/22     ezetimibe (ZETIA) 10 MG tablet Take 1 tablet (10 mg total) by mouth daily. 07/08/22     fluconazole (DIFLUCAN) 150 MG tablet Take 1 tablet by mouth once 11/06/21     insulin glargine (LANTUS SOLOSTAR) 100 UNIT/ML Solostar Pen Inject 10 Units into the skin daily. 02/18/22     Insulin Pen Needle 31G X 8 MM MISC Use as directed  10/22/21     metFORMIN (GLUCOPHAGE) 500 MG tablet Take 1 tablet by mouth in morning, and 2 tablets by mouth at night once a day 12/23/21     omeprazole (PRILOSEC) 20 MG capsule Take 1 capsule by mouth twice a day 12/23/21     ondansetron (ZOFRAN) 4 MG tablet Take 1 tablet (4 mg total) by mouth every 8 (eight) hours as needed for nausea or vomiting. 08/23/21   Stark Klein, MD  polyethylene glycol-electrolytes (NULYTELY) 420 g solution Take by mouth as directed 02/18/22     sertraline (ZOLOFT) 100 MG tablet Take 2 tablets by mouth once a day 12/23/21     spironolactone (ALDACTONE) 25 MG tablet TAKE 1 TABLET BY MOUTH ONCE DAILY Patient taking differently: Take 25 mg by mouth daily. 11/02/13   Midge Minium, MD  spironolactone (ALDACTONE) 25 MG  tablet Take 1 tablet by mouth once a day 02/03/22     spironolactone (ALDACTONE) 25 MG tablet Take 1 tablet (25 mg total) by mouth daily. 06/11/22     tiZANidine (ZANAFLEX) 4 MG tablet Take 1 tablet by mouth 3 times daily as needed 10/01/21     traMADol (ULTRAM) 50 MG tablet Take 1 tablet by mouth as needed twice a day 01/29/22     traMADol (ULTRAM) 50 MG tablet Take 1 tablet (50 mg total) by mouth 2 (two) times daily as needed. 05/26/22     ULTICARE SHORT PEN NEEDLES 31G X 8 MM MISC Please use to inject Victoza once daily. 10/22/21         Allergies    Statins and Glimepiride    Review of Systems   Review of Systems HPI Physical Exam Updated Vital Signs BP 120/72   Pulse 73   Temp 97.8 F (36.6 C) (Oral)   Resp 19   LMP 11/02/2013   SpO2 96%  Physical Exam Vitals and nursing note reviewed.  Constitutional:      General: She is not in acute distress.    Appearance: She is well-developed.  HENT:     Head: Normocephalic and atraumatic.     Comments: No mastoid tenderness or area of fluctuance on the mastoid No facial lesions noted or tenderness to palpation along the face    Right Ear: Tympanic membrane, ear canal and external ear normal.     Left Ear: Tympanic membrane, ear canal and external ear normal.     Nose:     Comments: No CSF leakage noted Eyes:     Conjunctiva/sclera: Conjunctivae normal.  Cardiovascular:     Rate and Rhythm: Normal rate and regular rhythm.     Pulses: Normal pulses.     Heart sounds: Normal heart sounds. No murmur heard. Pulmonary:     Effort: Pulmonary effort is normal. No respiratory distress.     Breath sounds: Normal breath sounds.  Abdominal:     Palpations: Abdomen is soft.     Tenderness: There is no abdominal tenderness. There is no guarding or rebound.  Musculoskeletal:        General: No swelling.     Cervical back: Normal range of motion and neck supple.  Skin:    General: Skin is warm and dry.     Capillary Refill: Capillary refill  takes less than 2 seconds.  Neurological:     General: No focal deficit present.     Mental Status: She is alert and oriented to person, place, and time.  Psychiatric:  Mood and Affect: Mood normal.     ED Results / Procedures / Treatments   Labs (all labs ordered are listed, but only abnormal results are displayed) Labs Reviewed  COMPREHENSIVE METABOLIC PANEL - Abnormal; Notable for the following components:      Result Value   Sodium 134 (*)    Glucose, Bld 159 (*)    All other components within normal limits  CULTURE, BLOOD (ROUTINE X 2)  CULTURE, BLOOD (ROUTINE X 2)  CBC WITH DIFFERENTIAL/PLATELET    EKG None  Radiology CT Head Wo Contrast  Result Date: 08/04/2022 CLINICAL DATA:  left sided facial pain EXAM: CT HEAD WITHOUT CONTRAST CT MAXILLOFACIAL WITHOUT CONTRAST TECHNIQUE: Multidetector CT imaging of the head and maxillofacial structures were performed using the standard protocol without intravenous contrast. Multiplanar CT image reconstructions of the maxillofacial structures were also generated. RADIATION DOSE REDUCTION: This exam was performed according to the departmental dose-optimization program which includes automated exposure control, adjustment of the mA and/or kV according to patient size and/or use of iterative reconstruction technique. COMPARISON:  None Available. FINDINGS: CT HEAD FINDINGS Brain: No evidence of acute infarction, hemorrhage, hydrocephalus, extra-axial collection or mass lesion/mass effect. Vascular: No hyperdense vessel or unexpected calcification. Skull: No acute fracture. CT MAXILLOFACIAL FINDINGS Osseous: No fracture or mandibular dislocation. No destructive process. Orbits: Negative. No traumatic or inflammatory finding. Sinuses: Largely clear sinuses. Minimal inferior right maxillary sinus mucosal thickening. No air-fluid levels. Soft tissues: Nonspecific 1 cm superficial right cheek soft tissue lesion (series 3, image 56). Otherwise,  unremarkable visualized soft tissues. Other: Large bilateral mastoid effusions with middle ear fluid. No evidence of a mass in the visualized nasopharynx. IMPRESSION: 1. No acute findings intracranially or in the face. 2. Large bilateral mastoid effusions with middle ear fluid. Correlate with the presence or absence of signs/symptoms of otomastoiditis. 3. Nonspecific 1 cm superficial right cheek soft tissue lesion, amenable to direct inspection. Electronically Signed   By: Margaretha Sheffield M.D.   On: 08/04/2022 15:59   CT Maxillofacial Wo Contrast  Result Date: 08/04/2022 CLINICAL DATA:  left sided facial pain EXAM: CT HEAD WITHOUT CONTRAST CT MAXILLOFACIAL WITHOUT CONTRAST TECHNIQUE: Multidetector CT imaging of the head and maxillofacial structures were performed using the standard protocol without intravenous contrast. Multiplanar CT image reconstructions of the maxillofacial structures were also generated. RADIATION DOSE REDUCTION: This exam was performed according to the departmental dose-optimization program which includes automated exposure control, adjustment of the mA and/or kV according to patient size and/or use of iterative reconstruction technique. COMPARISON:  None Available. FINDINGS: CT HEAD FINDINGS Brain: No evidence of acute infarction, hemorrhage, hydrocephalus, extra-axial collection or mass lesion/mass effect. Vascular: No hyperdense vessel or unexpected calcification. Skull: No acute fracture. CT MAXILLOFACIAL FINDINGS Osseous: No fracture or mandibular dislocation. No destructive process. Orbits: Negative. No traumatic or inflammatory finding. Sinuses: Largely clear sinuses. Minimal inferior right maxillary sinus mucosal thickening. No air-fluid levels. Soft tissues: Nonspecific 1 cm superficial right cheek soft tissue lesion (series 3, image 56). Otherwise, unremarkable visualized soft tissues. Other: Large bilateral mastoid effusions with middle ear fluid. No evidence of a mass in the  visualized nasopharynx. IMPRESSION: 1. No acute findings intracranially or in the face. 2. Large bilateral mastoid effusions with middle ear fluid. Correlate with the presence or absence of signs/symptoms of otomastoiditis. 3. Nonspecific 1 cm superficial right cheek soft tissue lesion, amenable to direct inspection. Electronically Signed   By: Margaretha Sheffield M.D.   On: 08/04/2022 15:59    Procedures Procedures  Medications Ordered in ED Medications - No data to display  ED Course/ Medical Decision Making/ A&P                             Medical Decision Making  GRAY HABIBI 62 y.o. presented today for facial pain. Working DDx that I considered at this time includes, but not limited to, trigeminal neuralgia, otomastoiditis, mastoiditis, jaw fracture.  Review of prior external notes: None  Unique Tests and My Interpretation:  CT head without contrast: No acute intracranial abnormalities CT maxillofacial without contrast: Signs suspicious of otomastoiditis  Discussion with Independent Historian: Husband  Discussion of Management of Tests: None  Risk: Low:  - based on diagnostic testing/clinical impression and treatment plan  Risk Stratification Score: none  Staffed with Sherwood Gambler, MD  R/o DDx: Trigeminal neuralgia: Patient does not have a history of this and has had this pain chronically due to her otomastoiditis Mastoiditis: No area of fluctuance on the mastoid or tenderness to palpation Jaw fracture: CT negative  Plan: Patient presented for pain on the right side.  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  Patient states she started feeling symptoms 2 days ago and has been on Augmentin since as per her ID specialist.  On exam patient not appear to be in any distress and had stable vitals.  Labs were unremarkable however CT did show otomastoiditis in which patient endorsed this is chronic.  Patient states she did not want to see ENT  as she has had bad experiences in the past even though I spoke with her about how they may be able to help and that it has been a while.  Patient had full decision-making capacity and still denied a ENT referral.  Patient stated her ID specialist has an appointment for her in the next few days to refer her to her neurologist.  Patient stated she wanted to be discharged as she would rather follow-up with her ID specialist for the neurologist referral.  Patient was given return precautions.patient stable for discharge at this time.  Patient verbalized understanding of plan.         Final Clinical Impression(s) / ED Diagnoses Final diagnoses:  Facial pain    Rx / DC Orders ED Discharge Orders     None         Elvina Sidle 08/04/22 1819    Sherwood Gambler, MD 08/04/22 [phone]

## 2022-08-04 NOTE — ED Provider Triage Note (Signed)
Emergency Medicine Provider Triage Evaluation Note  Lisa [name] , a 62 y.o. female  was evaluated in triage.  Pt complains of concerns for right-sided facial pain that has been ongoing for the past 2 days.  She is closely followed by Dr. Drucilla Schmidt of infectious disease due to concerns with CSF leakage.  She was just started on Augmentin 2 days ago.  Denies fever, neck pain, neck stiffness, headache, vision changes.    Review of Systems  Positive:  Negative:   Physical Exam  BP (!) 151/74 (BP Location: Right Arm)   Pulse 87   Temp 97.8 F (36.6 C) (Oral)   Resp 19   LMP 11/02/2013   SpO2 98%  Gen:   Awake, no distress   Resp:  Normal effort  MSK:   Moves extremities without difficulty  Other:  Able to ambulate without assistance or difficulty.  No spinal tenderness to palpation.  Full range of motion of neck without difficulty.  No tenderness to palpation noted to right sided face.  No tenderness to palpation noted to right mastoid.  Right TM unremarkable.  Medical Decision Making  Medically screening exam initiated at 1:56 PM.  Appropriate orders placed.  Lisa [name] was informed that the remainder of the evaluation will be completed by another provider, this initial triage assessment does not replace that evaluation, and the importance of remaining in the ED until their evaluation is complete.  1:56 PM - Discussed with RN that patient is in need of a room immediately. RN aware and working on room placement.    Lisa [name] A, PA-C 08/04/22 1358

## 2022-08-04 NOTE — Telephone Encounter (Signed)
Patient called, reports she is leaking CSF from nose and throat. She started having pain on the left side of her face and ear, denies headache or fevers. She started the Augmentin she has on hand on 2/24, reports the pain gets worse at night.   Recommended she go to the emergency department for prompt evaluation since she has a history of bacterial meningitis.   Beryle Flock, RN

## 2022-08-04 NOTE — Discharge Instructions (Signed)
Please follow-up with your ID specialist for your neurologist referral.  If symptoms worsen please return to ER.

## 2022-08-07 ENCOUNTER — Encounter: Payer: Self-pay | Admitting: Infectious Disease

## 2022-08-09 LAB — CULTURE, BLOOD (ROUTINE X 2)
Culture: NO GROWTH
Special Requests: ADEQUATE

## 2022-08-14 ENCOUNTER — Ambulatory Visit
Admission: RE | Admit: 2022-08-14 | Discharge: 2022-08-14 | Disposition: A | Discharge status: Home / Self Care | Payer: Commercial Managed Care - HMO | Source: Ambulatory Visit | Attending: Family Medicine | Admitting: Family Medicine

## 2022-08-14 ENCOUNTER — Other Ambulatory Visit: Payer: Self-pay | Admitting: Family Medicine

## 2022-08-14 ENCOUNTER — Other Ambulatory Visit (HOSPITAL_BASED_OUTPATIENT_CLINIC_OR_DEPARTMENT_OTHER): Payer: Self-pay

## 2022-08-14 DIAGNOSIS — M25561 Pain in right knee: Secondary | ICD-10-CM

## 2022-08-15 ENCOUNTER — Other Ambulatory Visit (HOSPITAL_BASED_OUTPATIENT_CLINIC_OR_DEPARTMENT_OTHER): Payer: Self-pay

## 2022-08-26 ENCOUNTER — Ambulatory Visit (INDEPENDENT_AMBULATORY_CARE_PROVIDER_SITE_OTHER): Payer: Commercial Managed Care - HMO | Admitting: Orthopaedic Surgery

## 2022-08-26 DIAGNOSIS — M1711 Unilateral primary osteoarthritis, right knee: Secondary | ICD-10-CM | POA: Diagnosis not present

## 2022-08-26 DIAGNOSIS — Z6841 Body Mass Index (BMI) 40.0 and over, adult: Secondary | ICD-10-CM | POA: Diagnosis not present

## 2022-08-26 NOTE — Progress Notes (Signed)
Office Visit Note   Patient: Lisa [name]           Date of Birth: 07-19-1960           MRN: [id number] Visit Date: 08/26/2022              Requested by: Collene Leyden, MD [address] [place],  [place] [address] PCP: Collene Leyden, MD   Assessment & Plan: Visit Diagnoses:  1. Primary osteoarthritis of right knee   2. Body mass index 40.0-44.9, adult (HCC)     Plan: Impression is advanced right knee DJD.  Medial compartment is bone-on-bone.  Patient is not currently in a good state of health and not a great surgical candidate at this point due to increased BMI and other factors.  She will try over-the-counter compression knee sleeve.  She will make all efforts at weight loss.  She will also make efforts at smoking cessation.  Questions encouraged and answered.  Follow-up as needed.  Follow-Up Instructions: No follow-ups on file.   Orders:  Orders Placed This Encounter  Procedures   Ambulatory referral to Physical Therapy   No orders of the defined types were placed in this encounter.     Procedures: No procedures performed   Clinical Data: No additional findings.   Subjective: Chief Complaint  Patient presents with   Right Knee - Pain    HPI  Lisa [name] is a 62 year old female here with her significant other for severe right knee pain.  She fell 2 weeks ago which exacerbated the pain.  She has had chronic right knee pain for years.  She has undergone steroid and gel injections which only helped a small amount.  She has been using a rolling walker since 2022 to ambulate.  Otherwise she uses tramadol hydrocodone for the pain.  Voltaren gel helps somewhat.  She is a retired [employer] scientist.  Review of Systems  Constitutional: Negative.   HENT: Negative.    Eyes: Negative.   Respiratory: Negative.    Cardiovascular: Negative.   Endocrine: Negative.   Musculoskeletal: Negative.   Neurological: Negative.   Hematological: Negative.   Psychiatric/Behavioral:  Negative.    All other systems reviewed and are negative.    Objective: Vital Signs: LMP 11/02/2013   Physical Exam Vitals and nursing note reviewed.  Constitutional:      Appearance: She is well-developed.  HENT:     Head: Atraumatic.     Nose: Nose normal.  Eyes:     Extraocular Movements: Extraocular movements intact.  Cardiovascular:     Pulses: Normal pulses.  Pulmonary:     Effort: Pulmonary effort is normal.  Abdominal:     Palpations: Abdomen is soft.  Musculoskeletal:     Cervical back: Neck supple.  Skin:    General: Skin is warm.     Capillary Refill: Capillary refill takes less than 2 seconds.  Neurological:     Mental Status: She is alert. Mental status is at baseline.  Psychiatric:        Behavior: Behavior normal.        Thought Content: Thought content normal.        Judgment: Judgment normal.     Ortho Exam  Examination right knee shows no joint effusion.  No real joint line tenderness.  Collaterals and cruciates are stable.  Specialty Comments:  No specialty comments available.  Imaging: No results found.   PMFS History: Patient Active Problem List   Diagnosis Date Noted  Primary osteoarthritis of right knee 08/26/2022   Otitis media 04/22/2022   Seroma of breast 04/22/2022   Vaccine counseling 04/22/2022   Lung nodule 04/22/2022   Hyperglycemia due to type 2 diabetes mellitus (Lisa [name]) 01/21/2022   Degeneration of lumbar intervertebral disc 01/21/2022   Breast hematoma 12/02/2021   Genetic testing 08/22/2021   Family history of breast cancer 08/14/2021   Malignant neoplasm of upper-outer quadrant of left breast in female, estrogen receptor positive (Lisa [name]) 08/12/2021   Breast mass, left 07/10/2021   Fever    Bacterial meningitis    Right sided weakness    Pressure injury of skin 06/13/2021   Sepsis due to Haemophilus influenzae with acute hypoxic respiratory failure and septic shock ([facility])    Acute bacterial meningitis [id number]    Acute metabolic encephalopathy [id number]   Altered mental status 06/12/2021   Obesity, morbid, BMI 40.0-49.9 (Lisa [name]) 03/24/2016   Family history of breast cancer in sister 10/21/2013   Tick bite of back 10/21/2012   Screening for malignant neoplasm of the cervix 05/19/2012   Routine gynecological examination 05/19/2012   Tobacco use 12/30/2011   Diabetes mellitus, type 2 ([facility]) 12/29/2011   HTN (hypertension) 12/29/2011   Hyperlipidemia 12/29/2011   CSF leak 12/29/2011   Psoriasis 12/29/2011   PCOS (polycystic ovarian syndrome) 12/29/2011   Depression with anxiety 12/29/2011   Past Medical History:  Diagnosis Date   Anxiety    Arthritis    Breast cancer (Lisa [name]) 06/28/2021   lump found   Breast cancer (Lisa [name])    left breast Lisa [name]   Breast hematoma 12/02/2021   Breast mass, left 07/10/2021   Bulging disc L-5   Chicken pox    Depression    Diabetes mellitus    Hyperlipidemia    Hypertension    Insomnia    Lung nodule 04/22/2022   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    Meningitis 06/11/2021   bacterial meningitis   Otitis media 04/22/2022   Polycystic ovarian disease    Vaccine counseling 04/22/2022    Family History  Problem Relation Age of Onset   Arthritis Mother    Hypertension Mother    Obesity Mother    Arthritis Father    Prostate cancer Father    Heart disease Father    Marfan syndrome Father    Hyperlipidemia Sister    Diabetes Sister    Marfan syndrome Sister    Breast cancer Sister    Early death Sister    Stroke Sister    Breast cancer Sister    Arthritis Sister    Diabetes Maternal Aunt    Diabetes Maternal Uncle    Bladder Cancer Maternal Uncle    Breast cancer Paternal Aunt    Hypertension Maternal Grandmother    Heart disease Maternal Grandmother    Diabetes Paternal Grandmother    Heart disease Paternal Grandmother    Stroke Paternal Grandmother    Breast cancer Paternal Grandmother    Heart disease Paternal  Grandfather     Past Surgical History:  Procedure Laterality Date   BREAST LUMPECTOMY Left 08/2021   BREAST LUMPECTOMY WITH SENTINEL LYMPH NODE BIOPSY Left 08/23/2021   Procedure: LEFT BREAST LUMPECTOMY WITH SENTINEL LYMPH NODE BIOPSY;  Surgeon: Stark Klein, MD;  Location: [facility];  Service: General;  Laterality: Left;   csf leak in skull  06/10/2003   noted failed attempt to close CSF leak in skull   DILATION AND EVACUATION  06/09/1988   miscarriage   IR FLUORO GUIDED NEEDLE PLC ASPIRATION/INJECTION LOC  06/12/2021   RADIOLOGY WITH ANESTHESIA N/A 06/12/2021   Procedure: IR WITH ANESTHESIA;  Surgeon: Radiologist, Medication, MD;  Location: [facility];  Service: Radiology;  Laterality: N/A;   TONSILLECTOMY AND ADENOIDECTOMY  06/10/1971   Social History   Occupational History   Not on file  Tobacco Use   Smoking status: Every Day    Packs/day: 1.00    Years: 44.00    Additional pack years: 0.00    Total pack years: 44.00    Types: Cigarettes    Last attempt to quit: 06/10/2021    Years since quitting: 1.2   Smokeless tobacco: Never  Substance and Sexual Activity   Alcohol use: No   Drug use: No   Sexual activity: Yes    Birth control/protection: Other-see comments, Post-menopausal    Comment: vasectomy

## 2022-09-01 ENCOUNTER — Encounter: Payer: Self-pay | Admitting: Infectious Disease

## 2022-09-01 ENCOUNTER — Telehealth: Payer: Self-pay | Admitting: Orthopaedic Surgery

## 2022-09-01 ENCOUNTER — Other Ambulatory Visit: Payer: Self-pay

## 2022-09-01 ENCOUNTER — Ambulatory Visit (INDEPENDENT_AMBULATORY_CARE_PROVIDER_SITE_OTHER): Payer: Commercial Managed Care - HMO | Admitting: Infectious Disease

## 2022-09-01 VITALS — BP 127/81 | HR 78 | Temp 97.7°F | Ht 66.0 in | Wt 269.0 lb

## 2022-09-01 DIAGNOSIS — G9600 Cerebrospinal fluid leak, unspecified: Secondary | ICD-10-CM

## 2022-09-01 DIAGNOSIS — L409 Psoriasis, unspecified: Secondary | ICD-10-CM | POA: Diagnosis not present

## 2022-09-01 DIAGNOSIS — C50412 Malignant neoplasm of upper-outer quadrant of left female breast: Secondary | ICD-10-CM | POA: Diagnosis not present

## 2022-09-01 DIAGNOSIS — G009 Bacterial meningitis, unspecified: Secondary | ICD-10-CM

## 2022-09-01 DIAGNOSIS — Z170 Estrogen receptor positive status [ER+]: Secondary | ICD-10-CM

## 2022-09-01 NOTE — Progress Notes (Signed)
Subjective:  Chief complaint: followup for CSF leak  : 1961-06-01, 62 y.o.   MRN: [id number]  HPI  62 y.o. 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 I had concerns for possible brain abscess developing due to amount of purulence on CSF LP. She fortunately did not on repeat MRI. She completed 14 days of high dose ceftriaxone and flagyl.  While she was in the hospital she has noticed a mass in her left breast and indeed on exam did have homogeneous nontender mass in the left breast.  Ultimately she was diagnosed with breast cancer and underwent surgery with Dr. Barry Dienes with lumpectomy and axillary lymph node dissection.  She did not want to go on tamoxifen or alternative treatment to that.  She did finish radiation.  Her course was complicated by a fluid collection that developed in her breast and with drainage of milky and then more bloody material from the nipple.  She had Augmentin at home which I given her to have on hand in case she had a sinus infection to prevent further episodes of bacterial meningitis and she began this.  She was then seen in the office by the PA at [facility] cryosurgery who started doxycycline she then seen in follow-up by Dr. Barry Dienes who aspirated a bloody fluid collection and sent for culture which yielded no organism.  My partner Dr. Gale Journey is notified and he recommended the patient completing 2 weeks of doxycycline and Augmentin.  Since I last saw her she developed worsening erythema which she believed was due to radiation.  He also developed drainage from her left breast  She was seen by general surgery who are concerned about the seroma and she was restarted on Augmentin.    Ultrasound showed cyst the seroma had diminished in size as did an MRI of the breast.  She had a CT scan of the chest performed which she  thinks is showing a nodule though the verbiage is a bit obscure.  Been seen by rheumatology who believes she does have psoriatic arthritis and they offered different agents to suppress the immune system and treat this.  She is reluctant to take any of them.  I think if she were to start 1 of these drugs methotrexate would be the safest of the bunch that she showed me I would not be in favor of potent TNF alpha blockers or other Biologics.  In the interim she also had worsened CSF leakage similar to what she had when she was much much younger.  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 Bilateral complete       mastoid opacification extending to the epitympanum and engulfing the       ossicles which are intact.    He is going to be seen by ear nose and throat and potentially neurosurgery at the [facility] location in a few months.  She did take Augmentin when her CSF leakage was worsening.  She is not a issues with her on her breast.  She does continue to suffer from joint pain.  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    Meningitis 06/11/2021   bacterial meningitis   Otitis media 04/22/2022   Polycystic ovarian disease    Vaccine counseling 04/22/2022    Past Surgical History:  Procedure  Laterality Date   BREAST LUMPECTOMY Left 08/2021   BREAST LUMPECTOMY WITH SENTINEL LYMPH NODE BIOPSY Left 08/23/2021   Procedure: LEFT BREAST LUMPECTOMY WITH SENTINEL LYMPH NODE BIOPSY;  Surgeon: Stark Klein, MD;  Location: [place];  Service: General;  Laterality: Left;   csf leak in skull  06/10/2003   noted failed attempt to close CSF leak in skull   DILATION AND EVACUATION  06/09/1988   miscarriage   IR FLUORO GUIDED NEEDLE PLC ASPIRATION/INJECTION LOC  06/12/2021   RADIOLOGY WITH ANESTHESIA N/A 06/12/2021   Procedure: IR WITH ANESTHESIA;  Surgeon: Radiologist, Medication, MD;  Location: [place];  Service: Radiology;  Laterality: N/A;   TONSILLECTOMY AND ADENOIDECTOMY  06/10/1971    Family History  Problem Relation Age of Onset   Arthritis Mother    Hypertension Mother    Obesity Mother    Arthritis Father    Prostate cancer Father    Heart disease Father    Marfan syndrome Father    Hyperlipidemia Sister    Diabetes Sister    Marfan syndrome Sister    Breast cancer Sister    Early death Sister    Stroke Sister    Breast cancer Sister    Arthritis Sister    Diabetes Maternal Aunt    Diabetes Maternal Uncle    Bladder Cancer Maternal Uncle    Breast cancer Paternal Aunt    Hypertension Maternal Grandmother    Heart disease Maternal Grandmother    Diabetes Paternal Grandmother    Heart disease Paternal Grandmother    Stroke Paternal Grandmother    Breast cancer Paternal Grandmother    Heart disease Paternal Grandfather       Social History   Socioeconomic History   Marital status: Married    Spouse name: Not on file   Number of children: 2   Years of education: Not on file   Highest education level: Not on file  Occupational History   Not on file  Tobacco Use   Smoking status: Every Day    Packs/day: 1.00    Years: 44.00    Additional pack years: 0.00    Total pack years: 44.00    Types: Cigarettes    Last attempt to quit: 06/10/2021     Years since quitting: 1.2   Smokeless tobacco: Never  Substance and Sexual Activity   Alcohol use: No   Drug use: No   Sexual activity: Yes    Birth control/protection: Other-see comments, Post-menopausal    Comment: vasectomy  Other Topics Concern   Not on file  Social History Narrative   Not on file   Social Determinants of Radio broadcast assistant  Strain: Medium Risk (08/14/2021)   Overall Financial Resource Strain (CARDIA)    Difficulty of Paying Living Expenses: Somewhat hard  Food Insecurity: No Food Insecurity (08/14/2021)   Hunger Vital Sign    Worried About Running Out of Food in the Last Year: Never true    [place] in the Last Year: Never true  Transportation Needs: No Transportation Needs (08/14/2021)   PRAPARE - Hydrologist (Medical): No    Lack of Transportation (Non-Medical): No  Physical Activity: Not on file  Stress: Not on file  Social Connections: Not on file    Allergies  Allergen Reactions   Statins Other (See Comments)    Other reaction(s): Other CSF leaking out nose. Headache   Glimepiride Other (See Comments)    Hypoglicemia     Current Outpatient Medications:    albuterol (VENTOLIN HFA) 108 (90 Base) MCG/ACT inhaler, Inhale 2 puffs into the lungs every 6 (six) hours as needed for wheezing., Disp: 8.5 g, Rfl: 0   ALPRAZolam (XANAX) 0.25 MG tablet, Take 0.5-1 tablets (0.125-0.25 mg total) by mouth daily as needed., Disp: 20 tablet, Rfl: 0   aspirin EC 81 MG tablet, Take 81 mg by mouth daily. Swallow whole., Disp: , Rfl:    buPROPion (WELLBUTRIN XL) 300 MG 24 hr tablet, Take 1 tablet by mouth in the morning, Disp: 90 tablet, Rfl: 3   clobetasol ointment (TEMOVATE) [id number]-123456789 %, Apply 1 application externally twice a day for 10 day(s), Disp: 60 g, Rfl: 2   dapagliflozin propanediol (FARXIGA) 5 MG TABS tablet, Take 1 tablet by mouth once a day, Disp: 90 tablet, Rfl: 3   diclofenac (VOLTAREN) 75 MG EC tablet, Take 1 tablet  (75 mg total) by mouth in the morning and at bedtime., Disp: 180 tablet, Rfl: 0   diclofenac Sodium (VOLTAREN) 1 % GEL, Apply as directed externally., Disp: 100 g, Rfl: 5   Eszopiclone 3 MG TABS, Take 1 tablet (3 mg total) by mouth at bedtime., Disp: 30 tablet, Rfl: 0   ezetimibe (ZETIA) 10 MG tablet, Take 1 tablet (10 mg total) by mouth daily., Disp: 60 tablet, Rfl: 0   insulin glargine (LANTUS SOLOSTAR) 100 UNIT/ML Solostar Pen, Inject 10 Units into the skin daily., Disp: 15 mL, Rfl: 3   Insulin Pen Needle 31G X 8 MM MISC, Use as directed, Disp: 100 each, Rfl: 2   metFORMIN (GLUCOPHAGE) 500 MG tablet, Take 1 tablet by mouth in morning, and 2 tablets by mouth at night once a day, Disp: 270 tablet, Rfl: 3   ondansetron (ZOFRAN) 4 MG tablet, Take 1 tablet (4 mg total) by mouth every 8 (eight) hours as needed for nausea or vomiting., Disp: 20 tablet, Rfl: 0   sertraline (ZOLOFT) 100 MG tablet, Take 2 tablets by mouth once a day, Disp: 180 tablet, Rfl: 3   spironolactone (ALDACTONE) 25 MG tablet, Take 1 tablet (25 mg total) by mouth daily., Disp: 90 tablet, Rfl: 0   tiZANidine (ZANAFLEX) 4 MG tablet, Take 1 tablet by mouth 3 times daily as needed, Disp: 30 tablet, Rfl: 1   traMADol (ULTRAM) 50 MG tablet, Take 1 tablet (50 mg total) by mouth 2 (two) times daily as needed., Disp: 60 tablet, Rfl: 0   ALPRAZolam (XANAX) 0.25 MG tablet, Take 1/2 to 1 tablet by mouth daily as needed for anxiety, Disp: 20 tablet, Rfl: 0   Amoxicillin-Pot Clavulanate (AUGMENTIN PO), Take by mouth. (Patient not taking: Reported on 09/01/2022),  Disp: , Rfl:    bisacodyl 5 MG EC tablet, Take by mouth as directed, Disp: 25 tablet, Rfl: 0   buPROPion (WELLBUTRIN XL) 300 MG 24 hr tablet, Take 1 tablet by mouth every morning., Disp: , Rfl:    cyclobenzaprine (FLEXERIL) 5 MG tablet, Take 1 tablet (5 mg total) by mouth at bedtime as needed., Disp: 14 tablet, Rfl: 0   diclofenac (VOLTAREN) 75 MG EC tablet, Take 1 tablet by mouth 2 times  daily as needed, Disp: 180 tablet, Rfl: 0   diclofenac (VOLTAREN) 75 MG EC tablet, Take 1 tablet (75 mg total) by mouth 2 (two) times daily as needed., Disp: 180 tablet, Rfl: 0   diclofenac Sodium (VOLTAREN) 1 % GEL, Apply 4 g topically every 6 (six) hours as needed (Shoulder pain)., Disp: 100 g, Rfl: 0   diclofenac Sodium (VOLTAREN) 1 % GEL, Apply 4 grams topically to lower extremity joint every 6 (six) hours., Disp: 100 g, Rfl: 0   fluconazole (DIFLUCAN) 150 MG tablet, Take 1 tablet by mouth once, Disp: 1 tablet, Rfl: 0   omeprazole (PRILOSEC) 20 MG capsule, Take 1 capsule by mouth twice a day, Disp: 60 capsule, Rfl: 2   polyethylene glycol-electrolytes (NULYTELY) 420 g solution, Take by mouth as directed, Disp: 4000 mL, Rfl: 0   spironolactone (ALDACTONE) 25 MG tablet, TAKE 1 TABLET BY MOUTH ONCE DAILY (Patient taking differently: Take 25 mg by mouth daily.), Disp: 90 tablet, Rfl: 2   spironolactone (ALDACTONE) 25 MG tablet, Take 1 tablet by mouth once a day, Disp: 90 tablet, Rfl: 1   traMADol (ULTRAM) 50 MG tablet, Take 1 tablet by mouth as needed twice a day, Disp: 60 tablet, Rfl: 0   ULTICARE SHORT PEN NEEDLES 31G X 8 MM MISC, Please use to inject Victoza once daily., Disp: 100 each, Rfl: 2   Review of Systems  Constitutional:  Negative for activity change, appetite change, chills, diaphoresis, fatigue, fever and unexpected weight change.  HENT:  Negative for congestion, rhinorrhea, sinus pressure, sneezing, sore throat and trouble swallowing.   Eyes:  Negative for photophobia and visual disturbance.  Respiratory:  Negative for cough, chest tightness, shortness of breath, wheezing and stridor.   Cardiovascular:  Negative for chest pain, palpitations and leg swelling.  Gastrointestinal:  Negative for abdominal distention, abdominal pain, anal bleeding, blood in stool, constipation, diarrhea, nausea and vomiting.  Genitourinary:  Negative for difficulty urinating, dysuria, flank pain and  hematuria.  Musculoskeletal:  Positive for arthralgias. Negative for back pain, gait problem, joint swelling and myalgias.  Skin:  Positive for rash. Negative for color change, pallor and wound.  Neurological:  Negative for dizziness, tremors, weakness and light-headedness.  Hematological:  Negative for adenopathy. Does not bruise/bleed easily.  Psychiatric/Behavioral:  Negative for agitation, behavioral problems, confusion, decreased concentration, dysphoric mood and sleep disturbance.        Objective:   Physical Exam Constitutional:      Appearance: Normal appearance.  Cardiovascular:     Rate and Rhythm: Normal rate and regular rhythm.  Pulmonary:     Effort: Pulmonary effort is normal. No respiratory distress.     Breath sounds: No wheezing.  Neurological:     General: No focal deficit present.     Mental Status: She is alert and oriented to person, place, and time. Mental status is at baseline.         Assessment & Plan:   CSF leak:  Seen at [facility] office with ENT and neurosurgery.  She has Augmentin on hand if she needs it or event progression of infection of the CNS as she had with her H flu meningitis.  Postoperative breast fluid collection: This is resolved.  Breast cancer: Followed by oncology.  Psoriatic arthritis: If she were to go on a drug I would favor methotrexate.

## 2022-09-01 NOTE — Telephone Encounter (Signed)
Received vm from patient on 3/22 , she states she brought in a CD from [place] when she was here on 3/19. She needs to pick up that CD and would like to pick it up today as she will be in the area. Please call [phone]

## 2022-09-01 NOTE — Telephone Encounter (Signed)
Called and LMOM. Left disc up front for patient to pick up.

## 2022-09-03 ENCOUNTER — Other Ambulatory Visit: Payer: Self-pay

## 2022-09-10 ENCOUNTER — Ambulatory Visit (INDEPENDENT_AMBULATORY_CARE_PROVIDER_SITE_OTHER): Payer: Commercial Managed Care - HMO | Admitting: Internal Medicine

## 2022-09-10 ENCOUNTER — Encounter (HOSPITAL_BASED_OUTPATIENT_CLINIC_OR_DEPARTMENT_OTHER): Payer: Self-pay | Admitting: Internal Medicine

## 2022-09-10 VITALS — BP 130/72 | HR 85 | Ht 66.0 in | Wt 267.0 lb

## 2022-09-10 DIAGNOSIS — E781 Pure hyperglyceridemia: Secondary | ICD-10-CM

## 2022-09-10 DIAGNOSIS — Z794 Long term (current) use of insulin: Secondary | ICD-10-CM | POA: Diagnosis not present

## 2022-09-10 DIAGNOSIS — E1165 Type 2 diabetes mellitus with hyperglycemia: Secondary | ICD-10-CM | POA: Diagnosis not present

## 2022-09-10 MED ORDER — OMEGA-3-ACID ETHYL ESTERS 1 G PO CAPS: 2.0000 g | ORAL_CAPSULE | Freq: Two times a day (BID) | ORAL | 3 refills | Status: AC

## 2022-09-10 NOTE — Patient Instructions (Signed)
Medication Instructions:  START lovaza 2 capsules twice daily  *If you need a refill on your cardiac medications before your next appointment, please call your pharmacy*   Lab Work: FASTING lab work to check cholesterol in 4-6 months ** complete about one week before next visit  If you have labs (blood work) drawn today and your tests are completely normal, you will receive your results only by: [place] (if you have MyChart) OR A paper copy in the mail If you have any lab test that is abnormal or we need to change your treatment, we will call you to review the results.    Follow-Up: At [facility], you and your health needs are our priority.  As part of our continuing mission to provide you with exceptional heart care, we have created designated Provider Care Teams.  These Care Teams include your primary Cardiologist (physician) and Advanced Practice Providers (APPs -  Physician Assistants and Nurse Practitioners) who all work together to provide you with the care you need, when you need it.  We recommend signing up for the patient portal called "MyChart".  Sign up information is provided on this After Visit Summary.  MyChart is used to connect with patients for Virtual Visits (Telemedicine).  Patients are able to view lab/test results, encounter notes, upcoming appointments, etc.  Non-urgent messages can be sent to your provider as well.   To learn more about what you can do with MyChart, go to [email or web address].    Your next appointment:    4-6 months with Dr. Debara Pickett or Sharyn Lull NP  Other Instructions You have been referred to the PREP program thru the [facility]

## 2022-09-12 NOTE — Progress Notes (Signed)
LIPID CLINIC CONSULT NOTE  Chief Complaint:  Manage dyslipidemia  Primary Care Physician: Irven Coe, MD  Primary Cardiologist:  None  HPI:  Lisa [name] is a 62 y.o. female who is being seen today for the evaluation of lipidemia at the request of Irven Coe, MD. this is a pleasant 62 year old female kindly referred for evaluation management of dyslipidemia.  Past medical history includes type 2 diabetes, hypertension and dyslipidemia along with a prior breast cancer.  There is remote heart disease in the family and heart disease in her father.  She has a history of dyslipidemia primarily with elevated triglycerides.  Lipids in November 2023 showed total cholesterol 220, HDL 35, triglycerides 396 and LDL of 116.  She has had ongoing issues with bilateral temporal bone defects and recurrent CSF leak and has had bacterial meningitis requiring recurrent antibiotics.  She is followed by the [facility].  She has been intolerant to statins because in the past she felt that it caused her CSF leak and headaches.  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    Meningitis 06/11/2021   bacterial meningitis   Otitis media 04/22/2022   Polycystic ovarian disease    Vaccine counseling 04/22/2022    Past Surgical History:  Procedure Laterality Date   BREAST LUMPECTOMY Left 08/2021   BREAST LUMPECTOMY WITH SENTINEL LYMPH NODE BIOPSY Left 08/23/2021   Procedure: LEFT BREAST LUMPECTOMY WITH SENTINEL LYMPH NODE BIOPSY;  Surgeon: Almond Lint, MD;  Location: [facility];  Service: General;   Laterality: Left;   csf leak in skull  06/10/2003   noted failed attempt to close CSF leak in skull   DILATION AND EVACUATION  06/09/1988   miscarriage   IR FLUORO GUIDED NEEDLE PLC ASPIRATION/INJECTION LOC  06/12/2021   RADIOLOGY WITH ANESTHESIA N/A 06/12/2021   Procedure: IR WITH ANESTHESIA;  Surgeon: Radiologist, Medication, MD;  Location[facility];  Service: Radiology;  Laterality: N/A;   TONSILLECTOMY AND ADENOIDECTOMY  06/10/1971    FAMHx:  Family History  Problem Relation Age of Onset   Arthritis Mother    Hypertension Mother    Obesity Mother    Arthritis Father    Prostate cancer Father    Heart disease Father    Marfan syndrome Father    Hyperlipidemia Sister    Diabetes Sister    Marfan syndrome Sister    Breast cancer Sister    Early death Sister    Stroke Sister    Breast cancer Sister    Arthritis Sister    Diabetes Maternal Aunt    Diabetes Maternal Uncle    Bladder Cancer Maternal Uncle    Breast cancer Paternal Aunt    Hypertension Maternal Grandmother    Heart disease Maternal Grandmother    Diabetes Paternal Grandmother    Heart disease Paternal Grandmother    Stroke Paternal Grandmother    Breast cancer Paternal Grandmother    Heart disease Paternal Grandfather  SOCHx:   reports that she has been smoking cigarettes. She has a 44.00 pack-year smoking history. She has never used smokeless tobacco. She reports that she does not drink alcohol and does not use drugs.  ALLERGIES:  Allergies  Allergen Reactions   Statins Other (See Comments)    Other reaction(s): Other CSF leaking out nose. Headache   Glimepiride Other (See Comments)    Hypoglicemia    ROS: Pertinent items noted in HPI and remainder of comprehensive ROS otherwise negative.  HOME MEDS: Current Outpatient Medications on File Prior to Visit  Medication Sig Dispense Refill   albuterol (VENTOLIN HFA) 108 (90 Base) MCG/ACT inhaler Inhale 2 puffs into the lungs every 6 (six) hours as  needed for wheezing. 8.5 g 0   ALPRAZolam (XANAX) 0.25 MG tablet Take 0.5-1 tablets (0.125-0.25 mg total) by mouth daily as needed. 20 tablet 0   Amoxicillin-Pot Clavulanate (AUGMENTIN PO) Take by mouth.     aspirin EC 81 MG tablet Take 81 mg by mouth daily. Swallow whole.     buPROPion (WELLBUTRIN XL) 300 MG 24 hr tablet Take 1 tablet by mouth in the morning 90 tablet 3   clobetasol ointment (TEMOVATE) 0.05 % Apply 1 application externally twice a day for 10 day(s) 60 g 2   dapagliflozin propanediol (FARXIGA) 5 MG TABS tablet Take 1 tablet by mouth once a day 90 tablet 3   diclofenac (VOLTAREN) 75 MG EC tablet Take 1 tablet (75 mg total) by mouth in the morning and at bedtime. 180 tablet 0   diclofenac Sodium (VOLTAREN) 1 % GEL Apply as directed externally. 100 g 5   Eszopiclone 3 MG TABS Take 1 tablet (3 mg total) by mouth at bedtime. 30 tablet 0   ezetimibe (ZETIA) 10 MG tablet Take 1 tablet (10 mg total) by mouth daily. 60 tablet 0   HYDROcodone-acetaminophen (NORCO) 7.5-325 MG tablet Take 1 tablet by mouth every 6 (six) hours as needed.     insulin glargine (LANTUS SOLOSTAR) 100 UNIT/ML Solostar Pen Inject 10 Units into the skin daily. 15 mL 3   Insulin Pen Needle 31G X 8 MM MISC Use as directed 100 each 2   liraglutide (VICTOZA) 18 MG/3ML SOPN Inject 1.2 mg into the skin daily.     metFORMIN (GLUCOPHAGE) 500 MG tablet Take 1 tablet by mouth in morning, and 2 tablets by mouth at night once a day 270 tablet 3   ondansetron (ZOFRAN) 4 MG tablet Take 1 tablet (4 mg total) by mouth every 8 (eight) hours as needed for nausea or vomiting. 20 tablet 0   sertraline (ZOLOFT) 100 MG tablet Take 2 tablets by mouth once a day 180 tablet 3   spironolactone (ALDACTONE) 25 MG tablet Take 1 tablet (25 mg total) by mouth daily. 90 tablet 0   tiZANidine (ZANAFLEX) 4 MG tablet Take 1 tablet by mouth 3 times daily as needed 30 tablet 1   traMADol (ULTRAM) 50 MG tablet Take 1 tablet (50 mg total) by mouth 2  (two) times daily as needed. 60 tablet 0   No current facility-administered medications on file prior to visit.    LABS/IMAGING: No results found for this or any previous visit (from the past 48 hour(s)). No results found.  LIPID PANEL:    Component Value Date/Time   CHOL 207 (H) 10/21/2013 1357   TRIG 324 (H) 06/13/2021 0701   HDL 29.10 (L) 10/21/2013 1357   CHOLHDL 7 10/21/2013 1357   VLDL 100.4 (H)  10/21/2013 1357   LDLCALC 78 10/21/2013 1357   LDLDIRECT 115.7 05/19/2012 1127    WEIGHTS: Wt Readings from Last 3 Encounters:  09/10/22 267 lb (121.1 kg)  09/01/22 269 lb (122 kg)  06/16/22 266 lb 8 oz (120.9 kg)    VITALS: BP 130/72   Pulse 85   Ht 5\' 6"  (1.676 m)   Wt 267 lb (121.1 kg)   LMP 11/02/2013   BMI 43.09 kg/m   EXAM: Deferred  EKG: Deferred  ASSESSMENT: Mixed dyslipidemia with high triglycerides Family history of heart disease in her father Statin intolerance with headaches and CSF leak Insulin dependent diabetes  PLAN: 1.   Lisa [name] has had a mixed dyslipidemia with high triglycerides.  There is some heart disease in her family.  She is hesitant to try statins because in the past she has had headaches and CSF leak although does have bilateral temporal bone defects which have put her at risk for and contributed to meningitis in the past.  Will plan to try her on omega-3 fatty acids to help with her triglycerides.  She feels that diet is a big factor in this and recently felt that her glucose control has not been as good.  She is on insulin for diabetes as well as metformin, Victoza and [place]xiga.  Hopefully as her blood sugars improve her triglycerides will come down as well.  Plan repeat lipid profile and LP(a) in about 3 to 4 months and follow-up at that time.  Thanks for the kind referral.  Chrystie NoseKenneth C. Azaiah Licciardi, MD, H[facility] Behavioral Center IncFACC, FACP  [place]  [facility]  Medical Director of the Advanced Lipid Disorders &  Cardiovascular Risk Reduction  Clinic Diplomate of the American Board of Clinical Lipidology Attending Cardiologist  Direct Dial: [phone]  Fax: [phone]  Website:  [email or web address]  Chrystie NoseKenneth C [place] Fleek 09/12/2022, 7:58 AM

## 2022-09-15 ENCOUNTER — Ambulatory Visit: Payer: Commercial Managed Care - HMO | Attending: General Surgery

## 2022-09-15 VITALS — Wt 267.0 lb

## 2022-09-15 DIAGNOSIS — Z483 Aftercare following surgery for neoplasm: Secondary | ICD-10-CM | POA: Insufficient documentation

## 2022-09-15 NOTE — Therapy (Signed)
OUTPATIENT PHYSICAL THERAPY SOZO SCREENING NOTE   Patient Name: Lisa CapersDeborah J Menta MRN: [id number] DOB:09/22/1960, 62 y.o., female Today's Date: 09/15/2022  PCP: Irven CoeHammer, Eli, MD REFERRING PROVIDER: Almond LintByerly, Faera, MD   PT End of Session - 09/15/22 1005     Visit Number 5   # unchanged due to screen only   PT Start Time 1003    PT Stop Time 1008    PT Time Calculation (min) 5 min    Activity Tolerance Patient tolerated treatment well    Behavior During Therapy [facility] for tasks assessed/performed             Past Medical History:  Diagnosis Date   Anxiety    Arthritis    Breast cancer 06/28/2021   lump found   [facility]   Breast hematoma 12/02/2021   Breast mass, left 07/10/2021   Bulging disc L-5   Chicken pox    Depression    Diabetes mellitus    Hyperlipidemia    Hypertension    Insomnia    Lung nodule 04/22/2022   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    Meningitis 06/11/2021   bacterial meningitis   Otitis media 04/22/2022   Polycystic ovarian disease    Vaccine counseling 04/22/2022   Past Surgical History:  Procedure Laterality Date   BREAST LUMPECTOMY Left 08/2021   BREAST LUMPECTOMY WITH SENTINEL LYMPH NODE BIOPSY Left 08/23/2021   Procedure: LEFT BREAST LUMPECTOMY WITH SENTINEL LYMPH NODE BIOPSY;  Surgeon: Almond LintByerly, Faera, MD;  Location: [facility];  Service: General;  Laterality: Left;   csf leak in skull  06/10/2003   noted failed attempt to close CSF leak in skull   DILATION AND EVACUATION  06/09/1988   miscarriage   IR FLUORO GUIDED NEEDLE PLC ASPIRATION/INJECTION LOC  06/12/2021   RADIOLOGY WITH ANESTHESIA N/A 06/12/2021   Procedure: IR WITH ANESTHESIA;  Surgeon: Radiologist, Medication, MD;  Location: [facility] OR;  Service: Radiology;  Laterality: N/A;   TONSILLECTOMY AND ADENOIDECTOMY  06/10/1971   Patient Active Problem List   Diagnosis Date Noted   Primary osteoarthritis of right  knee 08/26/2022   Otitis media 04/22/2022   Seroma of breast 04/22/2022   Vaccine counseling 04/22/2022   Lung nodule 04/22/2022   Hyperglycemia due to type 2 diabetes mellitus 01/21/2022   Degeneration of lumbar intervertebral disc 01/21/2022   Breast hematoma 12/02/2021   Genetic testing 08/22/2021   Family history of breast cancer 08/14/2021   Malignant neoplasm of upper-outer quadrant of left breast in female, estrogen receptor positive 08/12/2021   Breast mass, left 07/10/2021   Fever    Bacterial meningitis    Right sided weakness    Pressure injury of skin 06/13/2021   Sepsis due to Haemophilus influenzae with acute hypoxic respiratory failure and septic shock    Acute bacterial meningitis 06/12/2021   Acute metabolic encephalopathy 06/12/2021   Altered mental status 06/12/2021   Obesity, morbid, BMI 40.0-49.9 03/24/2016   Family history of breast cancer in sister 10/21/2013   Tick bite of back 10/21/2012   Screening for malignant neoplasm of the cervix 05/19/2012   Routine gynecological examination 05/19/2012   Tobacco use 12/30/2011   Diabetes mellitus, type 2 (HCC) 12/29/2011   HTN (hypertension) 12/29/2011   Hyperlipidemia 12/29/2011   CSF leak 12/29/2011   Psoriasis 12/29/2011   PCOS (polycystic ovarian syndrome) 12/29/2011   Depression with anxiety 12/29/2011  REFERRING DIAG: left breast cancer at risk for lymphedema  THERAPY DIAG:  Aftercare following surgery for neoplasm  PERTINENT HISTORY: Malignant neoplasm of upper-outer quadrant of left breast, invasive ductal carcinoma, stage IA, c(T1c, 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 She had negative margins and 1/5 positive nodes for a final path of pT1cN1a. She received adjuvant radiation. She was going to start anastrozole, but didn't. She was seen on 09/06/2021 with pain and swelling in the axilla, thought to have a possible seroma in the axilla did not  attempt aspiration due to history of bacterial meningitis and possible risk of infection. DM, depression, HTN, obesity, arthritis She returned to [place] 03/24/2022 with recurrent nipple drainage.  She had  PT for lymphedema and she got a flexi touch. She is doing manual lymph drainage once daily and the flexitouch once daily as well as wearing compression bra. She got mammogram and ultrasound that showed a 3.9 cm residual seroma that appeared to be non infected . She was advised to continue with compression bra and that fluid may come out until seroma is gone. Not a problem per MD as long as there is no infection.   PRECAUTIONS: left UE Lymphedema risk, None  SUBJECTIVE: Pt returns for her 3 month L-Dex screen.   PAIN:  Are you having pain? No  SOZO SCREENING: Patient was assessed today using the SOZO machine to determine the lymphedema index score. This was compared to her baseline score. It was determined that she is within the recommended range when compared to her baseline and no further action is needed at this time. She will continue SOZO screenings. These are done every 3 months for 2 years post operatively followed by every 6 months for 2 years, and then annually.   L-DEX FLOWSHEETS - 09/15/22 1000       L-DEX LYMPHEDEMA SCREENING   Measurement Type Unilateral    L-DEX MEASUREMENT EXTREMITY Upper Extremity    POSITION  Standing    DOMINANT SIDE Right    At Risk Side Left    BASELINE SCORE (UNILATERAL) 5.6    L-DEX SCORE (UNILATERAL) 5.1    VALUE CHANGE (UNILAT) -0.5               Hermenia Bers, PTA 09/15/2022, 10:07 AM

## 2022-09-22 ENCOUNTER — Other Ambulatory Visit: Payer: Self-pay | Admitting: Family Medicine

## 2022-09-22 DIAGNOSIS — Z1231 Encounter for screening mammogram for malignant neoplasm of breast: Secondary | ICD-10-CM

## 2022-09-30 ENCOUNTER — Telehealth: Payer: Self-pay | Admitting: *Deleted

## 2022-09-30 NOTE — Telephone Encounter (Signed)
Contacted regarding PREP Class referral. She is currently waiting on a PT evaluation and not interested in PREP at this time. I instructed her to call me back if we can assist at a later date.

## 2022-10-09 ENCOUNTER — Ambulatory Visit
Admission: RE | Admit: 2022-10-09 | Discharge: 2022-10-09 | Disposition: A | Discharge status: Home / Self Care | Payer: Commercial Managed Care - HMO | Source: Ambulatory Visit | Attending: Adult Health | Admitting: Adult Health

## 2022-10-09 DIAGNOSIS — Z170 Estrogen receptor positive status [ER+]: Secondary | ICD-10-CM

## 2022-10-25 ENCOUNTER — Ambulatory Visit (HOSPITAL_BASED_OUTPATIENT_CLINIC_OR_DEPARTMENT_OTHER): Payer: Commercial Managed Care - HMO | Admitting: Physical Therapy

## 2022-11-04 ENCOUNTER — Inpatient Hospital Stay: Payer: Commercial Managed Care - HMO | Attending: Adult Health

## 2022-11-04 ENCOUNTER — Inpatient Hospital Stay (HOSPITAL_BASED_OUTPATIENT_CLINIC_OR_DEPARTMENT_OTHER): Payer: Commercial Managed Care - HMO | Admitting: Adult Health

## 2022-11-04 ENCOUNTER — Encounter: Payer: Self-pay | Admitting: Adult Health

## 2022-11-04 VITALS — BP 146/61 | HR 90 | Temp 98.1°F | Resp 18 | Ht 66.0 in | Wt 270.2 lb

## 2022-11-04 DIAGNOSIS — C50412 Malignant neoplasm of upper-outer quadrant of left female breast: Secondary | ICD-10-CM | POA: Insufficient documentation

## 2022-11-04 DIAGNOSIS — L4050 Arthropathic psoriasis, unspecified: Secondary | ICD-10-CM | POA: Insufficient documentation

## 2022-11-04 DIAGNOSIS — Z170 Estrogen receptor positive status [ER+]: Secondary | ICD-10-CM | POA: Diagnosis not present

## 2022-11-04 DIAGNOSIS — Z923 Personal history of irradiation: Secondary | ICD-10-CM | POA: Diagnosis not present

## 2022-11-04 DIAGNOSIS — C773 Secondary and unspecified malignant neoplasm of axilla and upper limb lymph nodes: Secondary | ICD-10-CM | POA: Diagnosis not present

## 2022-11-04 DIAGNOSIS — Z79811 Long term (current) use of aromatase inhibitors: Secondary | ICD-10-CM | POA: Insufficient documentation

## 2022-11-04 DIAGNOSIS — F17210 Nicotine dependence, cigarettes, uncomplicated: Secondary | ICD-10-CM | POA: Diagnosis not present

## 2022-11-04 LAB — CMP (CANCER CENTER ONLY)
ALT: 15 U/L (ref 0–44)
AST: 13 U/L — ABNORMAL LOW (ref 15–41)
Albumin: 4.5 g/dL (ref 3.5–5.0)
Alkaline Phosphatase: 66 U/L (ref 38–126)
Anion gap: 8 (ref 5–15)
BUN: 11 mg/dL (ref 8–23)
CO2: 29 mmol/L (ref 22–32)
Calcium: 9.8 mg/dL (ref 8.9–10.3)
Chloride: 100 mmol/L (ref 98–111)
Creatinine: 0.91 mg/dL (ref 0.44–1.00)
GFR, Estimated: >60 mL/min (ref >60)
Glucose, Bld: 145 mg/dL — ABNORMAL HIGH (ref 70–99)
Potassium: 4.4 mmol/L (ref 3.5–5.1)
Sodium: 137 mmol/L (ref 135–145)
Total Bilirubin: 0.3 mg/dL (ref 0.3–1.2)
Total Protein: 7.3 g/dL (ref 6.5–8.1)

## 2022-11-04 LAB — CBC WITH DIFFERENTIAL (CANCER CENTER ONLY)
Abs Immature Granulocytes: 0.02 10*3/uL (ref 0.00–0.07)
Basophils Absolute: 0.1 10*3/uL (ref 0.0–0.1)
Basophils Relative: 1 %
Eosinophils Absolute: 0.5 10*3/uL (ref 0.0–0.5)
Eosinophils Relative: 7 %
HCT: 44.4 % (ref 36.0–46.0)
Hemoglobin: 15.6 g/dL — ABNORMAL HIGH (ref 12.0–15.0)
Immature Granulocytes: 0 %
Lymphocytes Relative: 20 %
Lymphs Abs: 1.5 10*3/uL (ref 0.7–4.0)
MCH: 32.2 pg (ref 26.0–34.0)
MCHC: 35.1 g/dL (ref 30.0–36.0)
MCV: 91.5 fL (ref 80.0–100.0)
Monocytes Absolute: 0.8 10*3/uL (ref 0.1–1.0)
Monocytes Relative: 10 %
Neutro Abs: 4.7 10*3/uL (ref 1.7–7.7)
Neutrophils Relative %: 62 %
Platelet Count: 298 10*3/uL (ref 150–400)
RBC: 4.85 MIL/uL (ref 3.87–5.11)
RDW: 13.5 % (ref 11.5–15.5)
WBC Count: 7.6 10*3/uL (ref 4.0–10.5)
nRBC: 0 % (ref 0.0–0.2)

## 2022-11-04 NOTE — Assessment & Plan Note (Addendum)
Lisa [name] is a 62 year old woman with 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  Stage IA left breast ER+ IDC: Lisa [name] has no clinical or radiographic signs of breast cancer recurrence today.  She will continue to undergo annual mammograms which are due again in May 2025.  We will continue to see her every 6 months for a clinical breast exam and continued follow-up and surveillance. Bone Health: Most recent bone density testing is normal.  Since we are not giving her any antiestrogen therapy that can impact her bone density she can follow-up with her primary care provider about her bone health and when she should repeat testing. Health maintenance: She sees her primary care provider regularly.  We reviewed healthy diet and activity.  I congratulated her on taking the important first step of seeing the wellness clinic at [facility] to work on improving her diet, exercise, and ultimately her obesity.  Lisa [name] will return in 6 months for labs and follow-up.  She was instructed to call for any questions or concerns that may arise between now and her next visit.

## 2022-11-04 NOTE — Progress Notes (Signed)
[facility] Cancer Follow up:    Lisa Coe, MD [address] [place] [address] [place] [address]   DIAGNOSIS:  Cancer Staging  Malignant neoplasm of upper-outer quadrant of left breast in female, estrogen receptor positive (HCC) Staging form: Breast, AJCC 8th Edition - Clinical stage from 08/06/2021: Stage IA (cT1c, cN0, cM0, G2, ER+, PR+, HER2-) - Signed by Malachy Mood, MD on 08/13/2021 Stage prefix: Initial diagnosis Histologic grading system: 3 grade system - Pathologic stage from 08/23/2021: Stage IA (pT1c, pN1a, cM0, G2, ER+, PR+, HER2-, Oncotype DX score: 15) - Signed by Malachy Mood, MD on 10/21/2021 Stage prefix: Initial diagnosis Multigene prognostic tests performed: Oncotype DX Recurrence score range: Greater than or equal to 11 Histologic grading system: 3 grade system Residual tumor (R): R0 - None   SUMMARY OF ONCOLOGIC HISTORY: Oncology History Overview Note   Cancer Staging  Malignant neoplasm of upper-outer quadrant of left breast in female, estrogen receptor positive (HCC) Staging form: Breast, AJCC 8th Edition - Clinical stage from 08/06/2021: Stage IA (cT1c, cN0, cM0, G2, ER+, PR+, HER2-) - Signed by Malachy Mood, MD on 08/13/2021 - Pathologic stage from 08/23/2021: Stage IA (pT1c, pN1a, cM0, G2, ER+, PR+, HER2-, Oncotype DX score: 15) - Signed by Malachy Mood, MD on 10/21/2021     Malignant neoplasm of upper-outer quadrant of left breast in female, estrogen receptor positive (HCC)  07/27/2021 Mammogram   CLINICAL DATA:  Palpable lump in Lisa left breast.   EXAM: DIGITAL DIAGNOSTIC BILATERAL MAMMOGRAM WITH TOMOSYNTHESIS AND CAD; ULTRASOUND LEFT BREAST LIMITED  IMPRESSION: Highly suspicious left breast mass. No left axillary adenopathy. No other abnormalities.   08/06/2021 Cancer Staging   Staging form: Breast, AJCC 8th Edition - Clinical stage from 08/06/2021: Stage IA (cT1c, cN0, cM0, G2, ER+, PR+, HER2-) - Signed by Malachy Mood, MD on 08/13/2021 Stage prefix:  Initial diagnosis Histologic grading system: 3 grade system   08/06/2021 Initial Biopsy   Diagnosis Breast, left, needle core biopsy, Retroareolar 1.6cm left mass 3:00 (ribbon clip) - INVASIVE MAMMARY CARCINOMA - DUCTAL CARCINOMA IN SITU - SEE COMMENT Microscopic Comment Lisa biopsy material shows an infiltrative proliferation of cells arranged linearly and in small clusters. Based on Lisa biopsy, Lisa carcinoma appears Nottingham grade 2 of 3 and measures 1.5 cm in greatest linear extent.  E-cadherin is POSITIVE supporting a ductal origin.  PROGNOSTIC INDICATORS Results: Lisa tumor cells are EQUIVOCAL for Her2 (2+). Her2 by FISH will be performed and Lisa results reported separately. Estrogen Receptor: 100%, POSITIVE, STRONG-MODERATE STAINING INTENSITY Progesterone Receptor: 100%, POSITIVE, STRONG STAINING INTENSITY Proliferation Marker Ki67: 5%  FLUORESCENCE IN-SITU HYBRIDIZATION Results: GROUP 5: HER2 **NEGATIVE**   08/12/2021 Initial Diagnosis   Malignant neoplasm of upper-outer quadrant of left breast in female, estrogen receptor positive (HCC)    Genetic Testing   [facility] CustomNext Panel was Negative. Of note, a variant of uncertain significance was identified in Lisa BRIP1 gene (p.[id number]). Report date is 08/28/2021.  Lisa CustomNext-Cancer+RNAinsight panel offered by [employer] includes sequencing and rearrangement analysis for Lisa following 47 genes:  APC, ATM, AXIN2, BARD1, BMPR1A, BRCA1, BRCA2, BRIP1, CDH1, CDK4, CDKN2A, CHEK2, CTNNA1, DICER1, EPCAM, GREM1, HOXB13, KIT, MEN1, MLH1, MSH2, MSH3, MSH6, MUTYH, NBN, NF1, NTHL1, PALB2, PDGFRA, PMS2, POLD1, POLE, PTEN, RAD50, RAD51C, RAD51D, SDHA, SDHB, SDHC, SDHD, SMAD4, SMARCA4, STK11, TP53, TSC1, TSC2, and VHL.  RNA data is routinely analyzed for use in variant interpretation for all genes.   08/23/2021 Cancer Staging   Staging form: Breast, AJCC 8th Edition -  Pathologic stage from 08/23/2021: Stage IA (pT1c, pN1a, cM0, G2, ER+, PR+,  HER2-, Oncotype DX score: 15) - Signed by Malachy Mood, MD on 10/21/2021 Stage prefix: Initial diagnosis Multigene prognostic tests performed: Oncotype DX Recurrence score range: Greater than or equal to 11 Histologic grading system: 3 grade system Residual tumor (R): R0 - None   08/23/2021 Definitive Surgery   FINAL MICROSCOPIC DIAGNOSIS:   A. BREAST, LEFT, LUMPECTOMY:  - Invasive and in situ ductal carcinoma, 1.9 cm.  - Invasive carcinoma 0.2 cm from posterior margin and 0.3 cm from anterior margin.  - DCIS 0.1 cm from medial margin.  - Biopsy site and biopsy clip.  - See oncology table.   B. LYMPH NODE, LEFT AXILLARY #1, SENTINEL, EXCISION:  - One lymph node negative for metastatic carcinoma (0/1).   C. LYMPH NODE, LEFT AXILLARY #2, SENTINEL, EXCISION:  - Metastatic carcinoma in one lymph node (1/1).  - Metastasis is 0.4 cm.   D. LYMPH NODE, LEFT AXILLARY #3, SENTINEL, EXCISION:  - One lymph node negative for metastatic carcinoma (0/1).   E. LYMPH NODE, LEFT AXILLARY #4, SENTINEL, EXCISION:  - One lymph node negative for metastatic carcinoma (0/1).   F. LYMPH NODE, LEFT AXILLARY #5, SENTINEL, EXCISION:  - One lymph node negative for metastatic carcinoma (0/1).    08/23/2021 Miscellaneous   Oncotype DX was obtained on Lisa final surgical sample and Lisa recurrence score of 15 predicts a risk of recurrence outside Lisa breast over Lisa next 9 years of 14%, if Lisa [name]'s only systemic therapy is an antiestrogen for 5 years.  It also predicts no apparent benefit from chemotherapy.    09/25/2021 - 10/22/2021 Radiation Therapy   Site Technique Total Dose (Gy) Dose per Fx (Gy) Completed Fx Beam Energies  Breast, Left: Breast_L_Axilla 3D 40.05/40.05 2.67 15/15 10X, 15X  Breast, Left: Breast_L_Bst 3D 10/10 2 5/5 6X, 15X     10/2021 -  Anti-estrogen oral therapy   Exemestane or other antiestrogen therapy declined by [name]     CURRENT THERAPY: Exemestane  INTERVAL HISTORY: Lisa [name] 62 y.o. female returns for follow-up of her left breast cancer.  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  Lisa [name] opted to forego taking any antiestrogen therapy due to her multiple comorbidities.  Her most recent mammogram occurred on Oct 09, 2022 demonstrating no mammographic evidence of malignancy in Lisa bilateral breasts along with breast density category B.  Her most recent bone density occurred on Oct 31, 2021 which demonstrated normal bone density with a T-score of -0.2 in Lisa right femur.  Lisa [name] tells me that Lisa [name] has recently started going to Lisa wellness clinic at [facility] family [facility].  Lisa [name] is working on improving her fruit and vegetable intake.  [name] Active Problem List   Diagnosis Date Noted   Primary osteoarthritis of right knee 08/26/2022   Otitis media 04/22/2022   Seroma of breast 04/22/2022   Vaccine counseling 04/22/2022   Hyperglycemia due to type 2 diabetes mellitus (HCC) 01/21/2022   Degeneration of lumbar intervertebral disc 01/21/2022   Breast hematoma 12/02/2021   Genetic testing 08/22/2021   Family history of breast cancer 08/14/2021   Malignant neoplasm of upper-outer quadrant of left breast in female, estrogen receptor positive (HCC) 08/12/2021   Breast mass, left 07/10/2021   Fever    Bacterial meningitis    Right  sided weakness    Sepsis due to Haemophilus influenzae with acute hypoxic respiratory failure and septic shock (HCC)    Acute bacterial meningitis 06/12/2021   Acute metabolic encephalopathy 06/12/2021   Altered mental status 06/12/2021   Obesity, morbid, BMI 40.0-49.9 (HCC) 03/24/2016   Family history of breast cancer in sister 10/21/2013   Tick bite of back 10/21/2012   Screening for malignant neoplasm of Lisa cervix 05/19/2012   Routine gynecological examination 05/19/2012   Tobacco use 12/30/2011   Diabetes  mellitus, type 2 (HCC) 12/29/2011   HTN (hypertension) 12/29/2011   Hyperlipidemia 12/29/2011   CSF leak 12/29/2011   Psoriasis 12/29/2011   PCOS (polycystic ovarian syndrome) 12/29/2011   Depression with anxiety 12/29/2011    is allergic to statins and glimepiride.  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    Meningitis 06/11/2021   bacterial meningitis   Otitis media 04/22/2022   Polycystic ovarian disease    Vaccine counseling 04/22/2022    SURGICAL HISTORY: Past Surgical History:  Procedure Laterality Date   BREAST LUMPECTOMY Left 08/2021   BREAST LUMPECTOMY WITH SENTINEL LYMPH NODE BIOPSY Left 08/23/2021   Procedure: LEFT BREAST LUMPECTOMY WITH SENTINEL LYMPH NODE BIOPSY;  Surgeon: Almond Lint, MD;  Location: [facility];  Service: General;  Laterality: Left;   csf leak in skull  06/10/2003   noted failed attempt to close CSF leak in skull   DILATION AND EVACUATION  06/09/1988   miscarriage   IR FLUORO GUIDED NEEDLE PLC ASPIRATION/INJECTION LOC  06/12/2021   RADIOLOGY WITH ANESTHESIA N/A 06/12/2021   Procedure: IR WITH ANESTHESIA;  Surgeon: Radiologist, Medication, MD;  Location: [facility] OR;  Service: Radiology;  Laterality: N/A;   TONSILLECTOMY AND ADENOIDECTOMY  06/10/1971    SOCIAL HISTORY: Social History   Socioeconomic History   Marital status: Married    Spouse name: Not on file   Number of children: 2   Years of education: Not on file   Highest education level: Not on file  Occupational History   Not on file  Tobacco Use   Smoking status:  Every Day    Packs/day: 1.00    Years: 44.00    Additional pack years: 0.00    Total pack years: 44.00    Types: Cigarettes    Last attempt to quit: 06/10/2021    Years since quitting: 1.4   Smokeless tobacco: Never  Substance and Sexual Activity   Alcohol use: No   Drug use: No   Sexual activity: Yes    Birth control/protection: Other-see comments, Post-menopausal    Comment: vasectomy  Other Topics Concern   Not on file  Social History Narrative   Not on file   Social Determinants of Health   Financial Resource Strain: Medium Risk (08/14/2021)   Overall Financial Resource Strain (CARDIA)    Difficulty of Paying Living Expenses: Somewhat hard  Food Insecurity: No Food Insecurity (08/14/2021)   Hunger Vital Sign  Worried About Programme researcher, broadcasting/film/video in Lisa Last Year: Never true    Ran Out of Food in Lisa Last Year: Never true  Transportation Needs: No Transportation Needs (08/14/2021)   PRAPARE - Administrator, Civil Service (Medical): No    Lack of Transportation (Non-Medical): No  Physical Activity: Not on file  Stress: Not on file  Social Connections: Not on file  Intimate Partner Violence: Not on file    FAMILY HISTORY: Family History  Problem Relation Age of Onset   Arthritis Mother    Hypertension Mother    Obesity Mother    Arthritis Father    Prostate cancer Father    Heart disease Father    Marfan syndrome Father    Hyperlipidemia Sister    Diabetes Sister    Marfan syndrome Sister    Breast cancer Sister    Early death Sister    Stroke Sister    Breast cancer Sister    Arthritis Sister    Diabetes Maternal Aunt    Diabetes Maternal Uncle    Bladder Cancer Maternal Uncle    Breast cancer Paternal Aunt    Hypertension Maternal Grandmother    Heart disease Maternal Grandmother    Diabetes Paternal Grandmother    Heart disease Paternal Grandmother    Stroke Paternal Grandmother    Breast cancer Paternal Grandmother    Heart disease Paternal  Grandfather     Review of Systems  Constitutional:  Negative for appetite change, chills, fatigue, fever and unexpected weight change.  HENT:   Negative for hearing loss, lump/mass and trouble swallowing.   Eyes:  Negative for eye problems and icterus.  Respiratory:  Negative for chest tightness, cough and shortness of breath.   Cardiovascular:  Negative for chest pain, leg swelling and palpitations.  Gastrointestinal:  Negative for abdominal distention, abdominal pain, constipation, diarrhea, nausea and vomiting.  Endocrine: Negative for hot flashes.  Genitourinary:  Negative for difficulty urinating.   Musculoskeletal:  Positive for arthralgias (Chronic).  Skin:  Positive for rash (Chronic related to her psoriasis). Negative for itching.  Neurological:  Negative for dizziness, extremity weakness, headaches and numbness.  Hematological:  Negative for adenopathy. Does not bruise/bleed easily.  Psychiatric/Behavioral:  Negative for depression. Lisa [name] is not nervous/anxious.       PHYSICAL EXAMINATION    Vitals:   11/04/22 1034  BP: (!) 146/61  Pulse: 90  Resp: 18  Temp: 98.1 F (36.7 C)  SpO2: 98%    Physical Exam Constitutional:      General: Lisa [name] is not in acute distress.    Appearance: Normal appearance. Lisa [name] is not toxic-appearing.  HENT:     Head: Normocephalic and atraumatic.     Mouth/Throat:     Mouth: Mucous membranes are moist.     Pharynx: Oropharynx is clear. No oropharyngeal exudate or posterior oropharyngeal erythema.  Eyes:     General: No scleral icterus. Cardiovascular:     Rate and Rhythm: Normal rate and regular rhythm.     Pulses: Normal pulses.     Heart sounds: Normal heart sounds.  Pulmonary:     Effort: Pulmonary effort is normal.     Breath sounds: Normal breath sounds.  Chest:     Comments: Left breast status postlumpectomy and radiation no sign of local recurrence, right breast benign. Abdominal:     General: Abdomen is flat. Bowel  sounds are normal. There is no distension.     Palpations: Abdomen is soft.  Tenderness: There is no abdominal tenderness.  Musculoskeletal:        General: No swelling.     Cervical back: Neck supple.  Lymphadenopathy:     Cervical: No cervical adenopathy.  Skin:    General: Skin is warm and dry.     Findings: No rash.  Neurological:     General: No focal deficit present.     Mental Status: Lisa [name] is alert.  Psychiatric:        Mood and Affect: Mood normal.        Behavior: Behavior normal.     LABORATORY DATA:  CBC    Component Value Date/Time   WBC 7.6 11/04/2022 1018   WBC 7.9 08/04/2022 1439   RBC 4.85 11/04/2022 1018   HGB 15.6 (H) 11/04/2022 1018   HCT 44.4 11/04/2022 1018   PLT 298 11/04/2022 1018   MCV 91.5 11/04/2022 1018   MCH 32.2 11/04/2022 1018   MCHC 35.1 11/04/2022 1018   RDW 13.5 11/04/2022 1018   LYMPHSABS 1.5 11/04/2022 1018   MONOABS 0.8 11/04/2022 1018   EOSABS 0.5 11/04/2022 1018   BASOSABS 0.1 11/04/2022 1018    CMP     Component Value Date/Time   NA 137 11/04/2022 1018   K 4.4 11/04/2022 1018   CL 100 11/04/2022 1018   CO2 29 11/04/2022 1018   GLUCOSE 145 (H) 11/04/2022 1018   BUN 11 11/04/2022 1018   CREATININE 0.91 11/04/2022 1018   CALCIUM 9.8 11/04/2022 1018   PROT 7.3 11/04/2022 1018   ALBUMIN 4.5 11/04/2022 1018   AST 13 (L) 11/04/2022 1018   ALT 15 11/04/2022 1018   ALKPHOS 66 11/04/2022 1018   BILITOT 0.3 11/04/2022 1018   GFRNONAA >60 11/04/2022 1018       ASSESSMENT and THERAPY PLAN:   Malignant neoplasm of upper-outer quadrant of left breast in female, estrogen receptor positive (HCC) Lisa [name] is a 62 year old woman with 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  Stage IA left breast ER+ IDC: Lisa [name] has no clinical or radiographic signs of breast cancer recurrence today.  Lisa [name] will continue to undergo  annual mammograms which are due again in May 2025.  We will continue to see her every 6 months for a clinical breast exam and continued follow-up and surveillance. Bone Health: Most recent bone density testing is normal.  Since we are not giving her any antiestrogen therapy that can impact her bone density Lisa [name] can follow-up with her primary care provider about her bone health and when Lisa [name] should repeat testing. Health maintenance: Lisa [name] sees her primary care provider regularly.  We reviewed healthy diet and activity.  I congratulated her on taking Lisa important first step of seeing Lisa wellness clinic at [facility] to work on improving her diet, exercise, and ultimately her obesity.  Lisa [name] will return in 6 months for labs and follow-up.  Lisa [name] was instructed to call for any questions or concerns that may arise between now and her next visit.   All questions were answered. Lisa [name] knows to call Lisa clinic with any problems, questions or concerns. We can certainly see Lisa [name] much sooner if necessary.  Total encounter time:20 minutes*in face-to-face visit time, chart review, lab review, care coordination, order entry, and documentation of Lisa encounter time.    Lillard Anes, NP 11/04/22 11:22 AM Medical Oncology and Hematology [facility] [address] [place]  [place], [place] [address] Tel. [phone]    Fax. [phone]  *Total Encounter Time as defined by Lisa Centers for Medicare and Medicaid Services includes, in addition to Lisa face-to-face time of a [name] visit (documented in Lisa note above) non-face-to-face time: obtaining and reviewing outside history, ordering and reviewing medications, tests or procedures, care coordination (communications with other health care professionals or caregivers) and documentation in Lisa medical record.

## 2022-11-06 ENCOUNTER — Other Ambulatory Visit (HOSPITAL_BASED_OUTPATIENT_CLINIC_OR_DEPARTMENT_OTHER): Payer: Self-pay

## 2022-11-06 MED ORDER — ONDANSETRON HCL 4 MG PO TABS
4.0000 mg | ORAL_TABLET | Freq: Three times a day (TID) | ORAL | 0 refills | Status: AC | PRN
  Filled 2022-11-06: qty 30, 10d supply, fill #0

## 2022-11-06 MED ORDER — OZEMPIC (0.25 OR 0.5 MG/DOSE) 2 MG/3ML ~~LOC~~ SOPN
0.2500 mg | PEN_INJECTOR | SUBCUTANEOUS | 0 refills | Status: AC
  Filled 2022-11-06: qty 3, 28d supply, fill #0
  Filled 2022-11-20: qty 3, 56d supply, fill #0

## 2022-11-11 ENCOUNTER — Other Ambulatory Visit (HOSPITAL_BASED_OUTPATIENT_CLINIC_OR_DEPARTMENT_OTHER): Payer: Self-pay

## 2022-11-12 ENCOUNTER — Other Ambulatory Visit (HOSPITAL_BASED_OUTPATIENT_CLINIC_OR_DEPARTMENT_OTHER): Payer: Self-pay

## 2022-11-13 ENCOUNTER — Encounter: Payer: Self-pay | Admitting: Infectious Disease

## 2022-11-13 ENCOUNTER — Other Ambulatory Visit (HOSPITAL_BASED_OUTPATIENT_CLINIC_OR_DEPARTMENT_OTHER): Payer: Self-pay

## 2022-11-17 ENCOUNTER — Other Ambulatory Visit (HOSPITAL_BASED_OUTPATIENT_CLINIC_OR_DEPARTMENT_OTHER): Payer: Self-pay

## 2022-11-18 ENCOUNTER — Other Ambulatory Visit (HOSPITAL_BASED_OUTPATIENT_CLINIC_OR_DEPARTMENT_OTHER): Payer: Self-pay

## 2022-11-20 ENCOUNTER — Other Ambulatory Visit (HOSPITAL_BASED_OUTPATIENT_CLINIC_OR_DEPARTMENT_OTHER): Payer: Self-pay

## 2022-12-01 ENCOUNTER — Ambulatory Visit: Payer: Commercial Managed Care - HMO | Attending: General Surgery

## 2022-12-01 VITALS — Wt 260.5 lb

## 2022-12-01 DIAGNOSIS — Z483 Aftercare following surgery for neoplasm: Secondary | ICD-10-CM | POA: Insufficient documentation

## 2022-12-01 NOTE — Therapy (Signed)
OUTPATIENT PHYSICAL THERAPY SOZO SCREENING NOTE   Patient Name: Lisa [name] MRN: [id number] DOB:[birth year], 62 y.o., female Today's Date: 12/01/2022  PCP: Irven Coe, MD REFERRING PROVIDER: Almond Lint, MD   PT End of Session - 12/01/22 1001     Visit Number 5   # unchanged due to screen only   PT Start Time 1000    PT Stop Time 1004    PT Time Calculation (min) 4 min    Activity Tolerance Patient tolerated treatment well    Behavior During Therapy [facility] for tasks assessed/performed             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    Meningitis 06/11/2021   bacterial meningitis   Otitis media 04/22/2022   Polycystic ovarian disease    Vaccine counseling 04/22/2022   Past Surgical History:  Procedure Laterality Date   BREAST LUMPECTOMY Left 08/2021   BREAST LUMPECTOMY WITH SENTINEL LYMPH NODE BIOPSY Left 08/23/2021   Procedure: LEFT BREAST LUMPECTOMY WITH SENTINEL LYMPH NODE BIOPSY;  Surgeon: Almond Lint, MD;  Location: [facility];  Service: General;  Laterality: Left;   csf leak in skull  06/10/2003   noted failed attempt to close CSF leak in skull   DILATION AND EVACUATION  06/09/1988   miscarriage   IR FLUORO GUIDED NEEDLE PLC ASPIRATION/INJECTION LOC  06/12/2021   RADIOLOGY WITH ANESTHESIA N/A 06/12/2021   Procedure: IR WITH ANESTHESIA;  Surgeon: Radiologist, Medication, MD;  Location: [facility] OR;  Service: Radiology;  Laterality: N/A;   TONSILLECTOMY AND ADENOIDECTOMY  06/10/1971   Patient Active Problem List   Diagnosis Date Noted   Primary  osteoarthritis of right knee 08/26/2022   Otitis media 04/22/2022   Seroma of breast 04/22/2022   Vaccine counseling 04/22/2022   Hyperglycemia due to type 2 diabetes mellitus (HCC) 01/21/2022   Degeneration of lumbar intervertebral disc 01/21/2022   Breast hematoma 12/02/2021   Genetic testing 08/22/2021   Family history of breast cancer 08/14/2021   Malignant neoplasm of upper-outer quadrant of left breast in female, estrogen receptor positive (HCC) 08/12/2021   Breast mass, left 07/10/2021   Fever    Bacterial meningitis    Right sided weakness    Sepsis due to Haemophilus influenzae with acute hypoxic respiratory failure and septic shock (HCC)    Acute bacterial meningitis 06/12/2021   Acute metabolic encephalopathy 06/12/2021   Altered mental status 06/12/2021   Obesity, morbid, BMI 40.0-49.9 (HCC) 03/24/2016   Family history of breast cancer in sister 10/21/2013   Tick bite of back 10/21/2012   Screening for malignant neoplasm of the cervix 05/19/2012   Routine gynecological examination 05/19/2012   Tobacco use 12/30/2011   Diabetes mellitus, type 2 (HCC) 12/29/2011   HTN (hypertension) 12/29/2011   Hyperlipidemia 12/29/2011   CSF leak 12/29/2011   Psoriasis 12/29/2011   PCOS (polycystic ovarian syndrome) 12/29/2011   Depression with anxiety 12/29/2011    REFERRING DIAG: left breast cancer at  risk for lymphedema  THERAPY DIAG:  Aftercare following surgery for neoplasm  PERTINENT HISTORY: Malignant neoplasm of upper-outer quadrant of left breast, invasive ductal carcinoma, stage IA, c(T1c, 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 She had negative margins and 1/5 positive nodes for a final path of pT1cN1a. She received adjuvant radiation. She was going to start anastrozole, but didn't. She was seen on 09/06/2021 with pain and swelling in the axilla, thought to have a possible seroma in the axilla did not attempt  aspiration due to history of bacterial meningitis and possible risk of infection. DM, depression, HTN, obesity, arthritis She returned to MD 03/24/2022 with recurrent nipple drainage.  She had  PT for lymphedema and she got a flexi touch. She is doing manual lymph drainage once daily and the flexitouch once daily as well as wearing compression bra. She got mammogram and ultrasound that showed a 3.9 cm residual seroma that appeared to be non infected . She was advised to continue with compression bra and that fluid may come out until seroma is gone. Not a problem per MD as long as there is no infection.   PRECAUTIONS: left UE Lymphedema risk, None  SUBJECTIVE: Pt returns for her 3 month L-Dex screen.   PAIN:  Are you having pain? No  SOZO SCREENING: Patient was assessed today using the SOZO machine to determine the lymphedema index score. This was compared to her baseline score. It was determined that she is within the recommended range when compared to her baseline and no further action is needed at this time. She will continue SOZO screenings. These are done every 3 months for 2 years post operatively followed by every 6 months for 2 years, and then annually.   L-DEX FLOWSHEETS - 12/01/22 1000       L-DEX LYMPHEDEMA SCREENING   Measurement Type Unilateral    L-DEX MEASUREMENT EXTREMITY Upper Extremity    POSITION  Standing    DOMINANT SIDE Right    At Risk Side Left    BASELINE SCORE (UNILATERAL) 5.6    L-DEX SCORE (UNILATERAL) 4    VALUE CHANGE (UNILAT) -1.6               Hermenia Bers, PTA 12/01/2022, 10:03 AM

## 2022-12-15 ENCOUNTER — Ambulatory Visit: Payer: Commercial Managed Care - HMO

## 2023-01-03 ENCOUNTER — Ambulatory Visit (HOSPITAL_BASED_OUTPATIENT_CLINIC_OR_DEPARTMENT_OTHER): Payer: Commercial Managed Care - HMO | Admitting: Physical Therapy

## 2023-02-12 ENCOUNTER — Ambulatory Visit (HOSPITAL_BASED_OUTPATIENT_CLINIC_OR_DEPARTMENT_OTHER): Payer: Commercial Managed Care - HMO | Admitting: Internal Medicine

## 2023-02-24 ENCOUNTER — Other Ambulatory Visit (HOSPITAL_BASED_OUTPATIENT_CLINIC_OR_DEPARTMENT_OTHER): Payer: Self-pay

## 2023-03-04 ENCOUNTER — Ambulatory Visit: Payer: Commercial Managed Care - HMO | Admitting: Infectious Disease

## 2023-03-20 ENCOUNTER — Encounter: Payer: Self-pay | Admitting: Acute Care

## 2023-04-09 ENCOUNTER — Encounter: Payer: Self-pay | Admitting: Adult Health

## 2023-04-10 ENCOUNTER — Ambulatory Visit
Admission: RE | Admit: 2023-04-10 | Discharge: 2023-04-10 | Disposition: A | Discharge status: Home / Self Care | Payer: Managed Care, Other (non HMO) | Source: Ambulatory Visit | Attending: Family Medicine | Admitting: Family Medicine

## 2023-04-10 ENCOUNTER — Other Ambulatory Visit: Payer: Self-pay | Admitting: Family Medicine

## 2023-04-10 DIAGNOSIS — R229 Localized swelling, mass and lump, unspecified: Secondary | ICD-10-CM

## 2023-04-17 ENCOUNTER — Telehealth: Payer: Self-pay | Admitting: *Deleted

## 2023-04-17 ENCOUNTER — Other Ambulatory Visit: Payer: Self-pay | Admitting: Acute Care

## 2023-04-17 DIAGNOSIS — Z122 Encounter for screening for malignant neoplasm of respiratory organs: Secondary | ICD-10-CM

## 2023-04-17 DIAGNOSIS — Z87891 Personal history of nicotine dependence: Secondary | ICD-10-CM

## 2023-04-17 DIAGNOSIS — F17210 Nicotine dependence, cigarettes, uncomplicated: Secondary | ICD-10-CM

## 2023-04-17 NOTE — Telephone Encounter (Signed)
Patient has been resc and called

## 2023-04-17 NOTE — Telephone Encounter (Signed)
Please call PT to resched lung scan

## 2023-04-20 ENCOUNTER — Inpatient Hospital Stay: Admission: RE | Admit: 2023-04-20 | Payer: Commercial Managed Care - HMO | Source: Ambulatory Visit

## 2023-04-25 ENCOUNTER — Encounter: Payer: Self-pay | Admitting: Adult Health

## 2023-04-27 ENCOUNTER — Telehealth: Payer: Self-pay

## 2023-04-27 ENCOUNTER — Telehealth: Payer: Self-pay | Admitting: *Deleted

## 2023-04-27 NOTE — Telephone Encounter (Signed)
Due to ins change PT must cancel CT tomorrow.

## 2023-04-27 NOTE — Telephone Encounter (Signed)
Called regarding mychart message. She will follow up with PCP today regarding ultrasound. Ask if she wanted a earlier appt and she declined to move appt. She will leave appt as scheduled on 12/3. She may cancel appt after speaking with PCP and go to another facility. Ask her to call the office back at [phone] of needed.

## 2023-04-28 ENCOUNTER — Telehealth: Payer: Self-pay

## 2023-04-28 ENCOUNTER — Inpatient Hospital Stay: Admission: RE | Admit: 2023-04-28 | Payer: Managed Care, Other (non HMO) | Source: Ambulatory Visit

## 2023-04-28 NOTE — Telephone Encounter (Signed)
Called patient back to leave her a message that we do have availability this week to have Mardella Layman or Herbert Seta see her and evaluate her for possible lymphedema on 11/21.  Will await return call to get her scheduled. Lorayne Marek, RN

## 2023-04-28 NOTE — Telephone Encounter (Signed)
I am not in the office on 11/21, the opening on my schedule was by mistake and it has since been fixed (FYI).

## 2023-04-29 NOTE — Telephone Encounter (Signed)
NFN 

## 2023-05-04 ENCOUNTER — Telehealth: Payer: Self-pay | Admitting: Hematology and Oncology

## 2023-05-04 ENCOUNTER — Ambulatory Visit: Payer: Medicare Other | Attending: General Surgery

## 2023-05-04 VITALS — Wt 248.1 lb

## 2023-05-04 DIAGNOSIS — Z483 Aftercare following surgery for neoplasm: Secondary | ICD-10-CM | POA: Insufficient documentation

## 2023-05-04 NOTE — Therapy (Signed)
OUTPATIENT PHYSICAL THERAPY SOZO SCREENING NOTE   Patient Name: Lisa [name] MRN: [id number] DOB:1961/05/06, 62 y.o., female Today's Date: 05/04/2023  PCP: Irven Coe, MD REFERRING PROVIDER: Almond Lint, MD   PT End of Session - 05/04/23 1021     Visit Number 5   # unchanged due to screen only   PT Start Time 1019    PT Stop Time 1026    PT Time Calculation (min) 7 min    Activity Tolerance Patient tolerated treatment well    Behavior During Therapy [facility] for tasks assessed/performed             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    Meningitis 06/11/2021   bacterial meningitis   Otitis media 04/22/2022   Polycystic ovarian disease    Vaccine counseling 04/22/2022   Past Surgical History:  Procedure Laterality Date   BREAST LUMPECTOMY Left 08/2021   BREAST LUMPECTOMY WITH SENTINEL LYMPH NODE BIOPSY Left 08/23/2021   Procedure: LEFT BREAST LUMPECTOMY WITH SENTINEL LYMPH NODE BIOPSY;  Surgeon: Almond Lint, MD;  Location: [facility];  Service: General;  Laterality: Left;   csf leak in skull  06/10/2003   noted failed attempt to close CSF leak in skull   DILATION AND EVACUATION  06/09/1988   miscarriage   IR FLUORO GUIDED NEEDLE PLC ASPIRATION/INJECTION LOC  06/12/2021   RADIOLOGY WITH ANESTHESIA N/A 06/12/2021   Procedure: IR WITH ANESTHESIA;  Surgeon: Radiologist, Medication, MD;  Location: [facility] OR;  Service: Radiology;  Laterality: N/A;   TONSILLECTOMY AND ADENOIDECTOMY  06/10/1971   Patient Active Problem List   Diagnosis Date Noted   Primary  osteoarthritis of right knee 08/26/2022   Otitis media 04/22/2022   Seroma of breast 04/22/2022   Vaccine counseling 04/22/2022   Hyperglycemia due to type 2 diabetes mellitus (HCC) 01/21/2022   Degeneration of lumbar intervertebral disc 01/21/2022   Breast hematoma 12/02/2021   Genetic testing 08/22/2021   Family history of breast cancer 08/14/2021   Malignant neoplasm of upper-outer quadrant of left breast in female, estrogen receptor positive (HCC) 08/12/2021   Breast mass, left 07/10/2021   Fever    Bacterial meningitis    Right sided weakness    Sepsis due to Haemophilus influenzae with acute hypoxic respiratory failure and septic shock (HCC)    Acute bacterial meningitis 06/12/2021   Acute metabolic encephalopathy 06/12/2021   Altered mental status 06/12/2021   Obesity, morbid, BMI 40.0-49.9 (HCC) 03/24/2016   Family history of breast cancer in sister 10/21/2013   Tick bite of back 10/21/2012   Screening for malignant neoplasm of cervix 05/19/2012   Encounter for routine gynecological examination 05/19/2012   Tobacco use 12/30/2011   Diabetes mellitus, type 2 (HCC) 12/29/2011   HTN (hypertension) 12/29/2011   Hyperlipidemia 12/29/2011   CSF leak 12/29/2011   Psoriasis 12/29/2011   PCOS (polycystic ovarian syndrome) 12/29/2011   Depression with anxiety 12/29/2011    REFERRING DIAG: left breast cancer  at risk for lymphedema  THERAPY DIAG: Aftercare following surgery for neoplasm  PERTINENT HISTORY: Malignant neoplasm of upper-outer quadrant of left breast, invasive ductal carcinoma, stage IA, c(T1c, 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 She had negative margins and 1/5 positive nodes for a final path of pT1cN1a. She received adjuvant radiation. She was going to start anastrozole, but didn't. She was seen on 09/06/2021 with pain and swelling in the axilla, thought to have a possible seroma in the axilla did not attempt  aspiration due to history of bacterial meningitis and possible risk of infection. DM, depression, HTN, obesity, arthritis She returned to MD 03/24/2022 with recurrent nipple drainage.  She had  PT for lymphedema and she got a flexi touch. She is doing manual lymph drainage once daily and the flexitouch once daily as well as wearing compression bra. She got mammogram and ultrasound that showed a 3.9 cm residual seroma that appeared to be non infected . She was advised to continue with compression bra and that fluid may come out until seroma is gone. Not a problem per MD as long as there is no infection.   PRECAUTIONS: left UE Lymphedema risk, None  SUBJECTIVE: Pt returns for her 3 month L-Dex screen. "I have a mass on my lateral Lt forearm. I feel like my cancer doctor dropped the ball on that, my PCP got me an [place] an next will be an MRI but that hasn't been scheduled yet. I do now have an appt to see my cancer doc in early Dec."  PAIN:  Are you having pain? No  SOZO SCREENING: Patient was assessed today using the SOZO machine to determine the lymphedema index score. This was compared to her baseline score. It was determined that she is within the recommended range when compared to her baseline and no further action is needed at this time. She will continue SOZO screenings. These are done every 3 months for 2 years post operatively followed by every 6 months for 2 years, and then annually.   L-DEX FLOWSHEETS - 05/04/23 1000       L-DEX LYMPHEDEMA SCREENING   Measurement Type Unilateral    L-DEX MEASUREMENT EXTREMITY Upper Extremity    POSITION  Standing    DOMINANT SIDE Right    At Risk Side Left    BASELINE SCORE (UNILATERAL) 5.6    L-DEX SCORE (UNILATERAL) 9    VALUE CHANGE (UNILAT) 3.4            P: Begin 6 month L-Dex screens after next screen.    Hermenia Bers, PTA 05/04/2023, 10:25 AM

## 2023-05-04 NOTE — Telephone Encounter (Signed)
Pt called wanting to reschedule appt from 12/3. Pt was scheduled for 12/30 with Lillard Anes, DNP. Pt verbalized understanding of changes

## 2023-05-12 ENCOUNTER — Ambulatory Visit: Payer: Commercial Managed Care - HMO | Admitting: Adult Health

## 2023-05-12 ENCOUNTER — Other Ambulatory Visit: Payer: Commercial Managed Care - HMO

## 2023-06-04 ENCOUNTER — Telehealth: Payer: Self-pay | Admitting: *Deleted

## 2023-06-04 NOTE — Telephone Encounter (Signed)
[employer] calling about a PA for this PT's CT.   [phone] Option 4   Ref # is [id number]

## 2023-06-08 ENCOUNTER — Other Ambulatory Visit: Payer: Self-pay

## 2023-06-08 ENCOUNTER — Ambulatory Visit: Payer: Self-pay | Admitting: Adult Health

## 2023-06-08 NOTE — Telephone Encounter (Signed)
nfn

## 2023-06-09 ENCOUNTER — Other Ambulatory Visit: Payer: Self-pay

## 2023-06-22 ENCOUNTER — Telehealth: Payer: Self-pay

## 2023-06-22 NOTE — Telephone Encounter (Signed)
 Returned call to patient requesting to change date of her LDCT.  No answer.  Left VM

## 2023-06-29 NOTE — Progress Notes (Unsigned)
Subjective:  Chief complaint:  follow-up for CSF leak  : Apr 17, 1961, 63 y.o.   MRN: [id number]  HPI  63 y.o. 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 I had concerns for possible brain abscess developing due to amount of purulence on CSF LP. She fortunately did not on repeat MRI. She completed 14 days of high dose ceftriaxone and flagyl.  While she was in the hospital she has noticed a mass in her left breast and indeed on exam did have homogeneous nontender mass in the left breast.  Ultimately she was diagnosed with breast cancer and underwent surgery with Dr. Donell Beers with lumpectomy and axillary lymph node dissection.  Interim history:  "She did not want to go on tamoxifen or alternative treatment to that.  She did finish radiation.  Her course was complicated by a fluid collection that developed in her breast and with drainage of milky and then more bloody material from the nipple.  She had Augmentin at home which I given her to have on hand in case she had a sinus infection to prevent further episodes of bacterial meningitis and she began this.  She was then seen in the office by the PA at [facility] cryosurgery who started doxycycline she then seen in follow-up by Dr. Donell Beers who aspirated a bloody fluid collection and sent for culture which yielded no organism.  My partner Dr. Renold Don is notified and he recommended the patient completing 2 weeks of doxycycline and Augmentin.  Since I last saw her she developed worsening erythema which she believed was due to radiation.  He also developed drainage from her left breast  She was seen by general surgery who are concerned about the seroma and she was restarted on Augmentin.    Ultrasound showed cyst the seroma had diminished in size as did an MRI of the breast.  She had a CT scan of the chest  performed which she thinks is showing a nodule though the verbiage is a bit obscure.  Been seen by rheumatology who believes she does have psoriatic arthritis and they offered different agents to suppress the immune system and treat this.  She is reluctant to take any of them.  I think if she were to start 1 of these drugs methotrexate would be the safest of the bunch that she showed me I would not be in favor of potent TNF alpha blockers or other Biologics.  In the interim she also had worsened CSF leakage similar to what she had when she was much much younger.  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 Bilateral complete       mastoid opacification extending to the epitympanum and engulfing the       ossicles which are intact.    He is going to be seen by ear nose and throat and potentially neurosurgery at the [facility] location in a few months.  She did take Augmentin when her CSF leakage was worsening.  She is not a issues with her on her breast.  She does continue to suffer from joint pain.  She is not  on any medications post surgery for her breast cancer"  Discussed the use of AI scribe software for clinical note transcription with the patient, who gave verbal consent to proceed.  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 The mass was first noticed in November and has been described as hard, but not painful. The patient underwent a lumpectomy for the previous breast cancer and did not require chemotherapy. An ultrasound of the new mass was inconclusive, and an MRI is scheduled.  The  patient also reports worsening arthritis, particularly in the right knee, which is described as bone-on-bone. They have been advised to lose weight before considering surgery. The patient has been managing their chronic CSF leak with prophyhlactic antibiotics, which they have needed to use twice since their last appointment.  The patient has been considering transferring their care to [facility] due to personal circumstances. They have concerns about undergoing Neuro surgery to correct their CSF leak due to the invasive nature of the procedure.         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    Meningitis 06/11/2021   bacterial meningitis   Otitis media 04/22/2022   Polycystic ovarian disease    Vaccine counseling 04/22/2022    Past Surgical History:  Procedure Laterality Date   BREAST LUMPECTOMY Left 08/2021   BREAST LUMPECTOMY WITH SENTINEL LYMPH NODE BIOPSY Left 08/23/2021   Procedure: LEFT BREAST LUMPECTOMY WITH SENTINEL LYMPH NODE BIOPSY;  Surgeon: Almond Lint, MD;  Location: [facility];  Service: General;  Laterality: Left;   csf leak in skull  06/10/2003   noted failed attempt to close CSF leak in skull   DILATION AND EVACUATION  06/09/1988   miscarriage   IR FLUORO GUIDED NEEDLE PLC ASPIRATION/INJECTION LOC  06/12/2021   RADIOLOGY WITH ANESTHESIA N/A 06/12/2021   Procedure: IR WITH ANESTHESIA;  Surgeon: Radiologist, Medication, MD;  Location: [facility] OR;  Service: Radiology;  Laterality: N/A;   TONSILLECTOMY AND ADENOIDECTOMY  06/10/1971    Family History  Problem Relation Age  of Onset   Arthritis Mother    Hypertension Mother    Obesity Mother    Arthritis Father    Prostate cancer Father    Heart disease Father    Marfan syndrome Father    Hyperlipidemia Sister    Diabetes Sister    Marfan syndrome Sister    Breast cancer Sister    Early death Sister    Stroke Sister    Breast cancer Sister    Arthritis Sister    Diabetes Maternal Aunt    Diabetes Maternal Uncle    Bladder Cancer Maternal Uncle    Breast cancer Paternal Aunt    Hypertension Maternal Grandmother    Heart disease Maternal Grandmother    Diabetes Paternal Grandmother    Heart  disease Paternal Grandmother    Stroke Paternal Grandmother    Breast cancer Paternal Grandmother    Heart disease Paternal Grandfather       Social History   Socioeconomic History   Marital status: Married    Spouse name: Not on file   Number of children: 2   Years of education: Not on file   Highest education level: Not on file  Occupational History   Not on file  Tobacco Use   Smoking status: Every Day    Current packs/day: 0.00    Average packs/day: 1 pack/day for 44.0 years (44.0 ttl pk-yrs)    Types: Cigarettes    Start date: 06/10/1977    Last attempt to quit: 06/10/2021    Years since quitting: 2.0   Smokeless tobacco: Never  Substance and Sexual Activity   Alcohol use: No   Drug use: No   Sexual activity: Yes    Birth control/protection: Other-see comments, Post-menopausal    Comment: vasectomy  Other Topics Concern   Not on file  Social History Narrative   Not on file   Social Drivers of Health   Financial Resource Strain: Medium Risk (08/14/2021)   Overall Financial Resource Strain (CARDIA)    Difficulty of Paying Living Expenses: Somewhat hard  Food Insecurity: No Food Insecurity (08/14/2021)   Hunger Vital Sign    Worried About Running Out of Food in the Last Year: Never true    Ran Out of Food in the Last Year: Never true  Transportation Needs: No Transportation Needs  (08/14/2021)   PRAPARE - Administrator, Civil Service (Medical): No    Lack of Transportation (Non-Medical): No  Physical Activity: Unknown (05/14/2021)   Received from [facility], [facility]   Exercise Vital Sign    Days of Exercise per Week: 0 days    Minutes of Exercise per Session: Not on file  Stress: Stress Concern Present (05/14/2021)   Received from [facility], [facility] Occupational [facility] - Occupational Stress Questionnaire    Feeling of Stress : To some extent  Social Connections: Unknown (10/15/2021)   Received from [facility], [facility]   Social Network    Social Network: Not on file    Allergies  Allergen Reactions   Statins Other (See Comments)    Other reaction(s): Other CSF leaking out nose. Headache   Glimepiride Other (See Comments)    Hypoglicemia     Current Outpatient Medications:    albuterol (VENTOLIN HFA) 108 (90 Base) MCG/ACT inhaler, Inhale 2 puffs into the lungs every 6 (six) hours as needed for wheezing., Disp: 8.5 g, Rfl: 0   ALPRAZolam (XANAX) 0.25 MG tablet, Take 0.5-1 tablets (0.125-0.25 mg total) by mouth daily as needed., Disp: 20 tablet, Rfl: 0   Amoxicillin-Pot Clavulanate (AUGMENTIN PO), Take by mouth., Disp: , Rfl:    aspirin EC 81 MG tablet, Take 81 mg by mouth daily. Swallow whole., Disp: , Rfl:    buPROPion (WELLBUTRIN XL) 300 MG 24 hr tablet, Take 1 tablet by mouth in the morning, Disp: 90 tablet, Rfl: 3   Cholecalciferol (VITAMIN D3) 1.25 MG (50000 UT) CAPS, Take 1 capsule by mouth once a week., Disp: , Rfl:    clobetasol ointment (TEMOVATE) 0.05 %, Apply 1 application externally twice a day for 10 day(s), Disp: 60 g, Rfl: 2   dapagliflozin propanediol (FARXIGA) 5 MG TABS tablet, Take 1 tablet by mouth once a day, Disp: 90 tablet,  Rfl: 3   diclofenac (VOLTAREN) 75 MG EC tablet, Take 1 tablet (75 mg total) by mouth in the morning and at bedtime., Disp: 180 tablet, Rfl: 0   diclofenac  Sodium (VOLTAREN) 1 % GEL, Apply as directed externally., Disp: 100 g, Rfl: 5   Eszopiclone 3 MG TABS, Take 1 tablet (3 mg total) by mouth at bedtime., Disp: 30 tablet, Rfl: 0   ezetimibe (ZETIA) 10 MG tablet, Take 1 tablet (10 mg total) by mouth daily., Disp: 60 tablet, Rfl: 0   gabapentin (NEURONTIN) 100 MG capsule, Take 100 mg by mouth 2 (two) times daily., Disp: , Rfl:    HYDROcodone-acetaminophen (NORCO) 7.5-325 MG tablet, Take 1 tablet by mouth every 6 (six) hours as needed., Disp: , Rfl:    insulin glargine (LANTUS SOLOSTAR) 100 UNIT/ML Solostar Pen, Inject 10 Units into the skin daily., Disp: 15 mL, Rfl: 3   Insulin Pen Needle 31G X 8 MM MISC, Use as directed, Disp: 100 each, Rfl: 2   liraglutide (VICTOZA) 18 MG/3ML SOPN, Inject 1.2 mg into the skin daily., Disp: , Rfl:    metFORMIN (GLUCOPHAGE) 500 MG tablet, Take 1 tablet by mouth in morning, and 2 tablets by mouth at night once a day, Disp: 270 tablet, Rfl: 3   omega-3 acid ethyl esters (LOVAZA) 1 g capsule, Take 2 capsules (2 g total) by mouth 2 (two) times daily., Disp: 360 capsule, Rfl: 3   ondansetron (ZOFRAN) 4 MG tablet, Take 1 tablet (4 mg total) by mouth every 8 (eight) hours as needed for nausea or vomiting., Disp: 20 tablet, Rfl: 0   ondansetron (ZOFRAN) 4 MG tablet, Take 1 tablet (4 mg total) by mouth every 8 (eight) hours as needed., Disp: 30 tablet, Rfl: 0   Semaglutide,0.25 or 0.5MG /DOS, (OZEMPIC, 0.25 OR 0.5 MG/DOSE,) 2 MG/3ML SOPN, Inject 0.25 mg into the skin once a week., Disp: 3 mL, Rfl: 0   sertraline (ZOLOFT) 100 MG tablet, Take 2 tablets by mouth once a day, Disp: 180 tablet, Rfl: 3   spironolactone (ALDACTONE) 25 MG tablet, Take 1 tablet (25 mg total) by mouth daily., Disp: 90 tablet, Rfl: 0   tiZANidine (ZANAFLEX) 4 MG tablet, Take 1 tablet by mouth 3 times daily as needed, Disp: 30 tablet, Rfl: 1   traMADol (ULTRAM) 50 MG tablet, Take 1 tablet (50 mg total) by mouth 2 (two) times daily as needed., Disp: 60 tablet,  Rfl: 0   Review of Systems  Constitutional:  Negative for activity change, appetite change, chills, diaphoresis, fatigue, fever and unexpected weight change.  HENT:  Negative for congestion, rhinorrhea, sinus pressure, sneezing, sore throat and trouble swallowing.   Eyes:  Negative for photophobia and visual disturbance.  Respiratory:  Negative for cough, chest tightness, shortness of breath, wheezing and stridor.   Cardiovascular:  Negative for chest pain, palpitations and leg swelling.  Gastrointestinal:  Negative for abdominal distention, abdominal pain, anal bleeding, blood in stool, constipation, diarrhea, nausea and vomiting.  Genitourinary:  Negative for difficulty urinating, dysuria, flank pain and hematuria.  Musculoskeletal:  Negative for arthralgias, back pain, gait problem, joint swelling and myalgias.  Skin:  Negative for color change, pallor, rash and wound.  Neurological:  Negative for dizziness, tremors, weakness and light-headedness.  Hematological:  Negative for adenopathy. Does not bruise/bleed easily.  Psychiatric/Behavioral:  Negative for agitation, behavioral problems, confusion, decreased concentration, dysphoric mood and sleep disturbance.        Objective:   Physical Exam Constitutional:  General: She is not in acute distress.    Appearance: Normal appearance. She is well-developed. She is not ill-appearing or diaphoretic.  HENT:     Head: Normocephalic and atraumatic.     Right Ear: Hearing and external ear normal.     Left Ear: Hearing and external ear normal.     Nose: No nasal deformity or rhinorrhea.  Eyes:     General: No scleral icterus.    Conjunctiva/sclera: Conjunctivae normal.     Right eye: Right conjunctiva is not injected.     Left eye: Left conjunctiva is not injected.     Pupils: Pupils are equal, round, and reactive to light.  Neck:     Vascular: No JVD.  Cardiovascular:     Rate and Rhythm: Normal rate and regular rhythm.     Heart  sounds: Normal heart sounds, S1 normal and S2 normal. No murmur heard.    No friction rub.  Pulmonary:     Effort: Pulmonary effort is normal. No respiratory distress.     Breath sounds: No wheezing.  Abdominal:     General: There is no distension.     Palpations: Abdomen is soft.  Musculoskeletal:        General: Normal range of motion.     Right shoulder: Normal.     Left shoulder: Normal.     Cervical back: Normal range of motion and neck supple.     Right hip: Normal.     Left hip: Normal.     Right knee: Normal.     Left knee: Normal.  Lymphadenopathy:     Head:     Right side of head: No submandibular, preauricular or posterior auricular adenopathy.     Left side of head: No submandibular, preauricular or posterior auricular adenopathy.     Cervical: No cervical adenopathy.     Right cervical: No superficial or deep cervical adenopathy.    Left cervical: No superficial or deep cervical adenopathy.  Skin:    General: Skin is warm and dry.     Coloration: Skin is not pale.     Findings: No abrasion, bruising, ecchymosis, erythema, lesion or rash.     Nails: There is no clubbing.  Neurological:     General: No focal deficit present.     Mental Status: She is alert and oriented to person, place, and time. Mental status is at baseline.     Sensory: No sensory deficit.     Coordination: Coordination normal.     Gait: Gait normal.  Psychiatric:        Attention and Perception: She is attentive.        Mood and Affect: Mood normal.        Speech: Speech normal.        Behavior: Behavior normal. Behavior is cooperative.        Thought Content: Thought content normal.        Judgment: Judgment normal.         Assessment & Plan:  Assessment and Plan    left arm Mass New mass noted in November, hard and non-tender. Ultrasound was non-specific. MRI pending. - Await MRI results for further management.   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 Patient is hesitant about surgical correction due to perceived risks. - Continue current management strategy with antibiotics as needed for  sinus infections. --new augmentin sent int --vaccine against all respiratory viruses --if this approach fails then would strongly consider Neurosurgery to address skull defect  Osteoarthritis Right knee is bone-on-bone. Previous consultation with orthopedics suggested weight loss prior to surgical intervention. - Consider re-evaluation for surgical intervention after MRI results and potential weight loss.  General Health Maintenance - Refill Augmentin prescription for use in case of sinus infection. - Administer COVID-19 booster vaccine today. - Schedule annual follow-up appointment.      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

## 2023-06-30 ENCOUNTER — Encounter: Payer: Self-pay | Admitting: Infectious Disease

## 2023-06-30 ENCOUNTER — Ambulatory Visit: Payer: Medicare Other | Admitting: Infectious Disease

## 2023-06-30 ENCOUNTER — Other Ambulatory Visit: Payer: Self-pay

## 2023-06-30 VITALS — BP 134/82 | HR 70 | Temp 97.8°F

## 2023-06-30 DIAGNOSIS — G009 Bacterial meningitis, unspecified: Secondary | ICD-10-CM

## 2023-06-30 DIAGNOSIS — Z7185 Encounter for immunization safety counseling: Secondary | ICD-10-CM | POA: Diagnosis not present

## 2023-06-30 DIAGNOSIS — G9600 Cerebrospinal fluid leak, unspecified: Secondary | ICD-10-CM | POA: Diagnosis not present

## 2023-06-30 DIAGNOSIS — Z23 Encounter for immunization: Secondary | ICD-10-CM

## 2023-06-30 DIAGNOSIS — L4050 Arthropathic psoriasis, unspecified: Secondary | ICD-10-CM

## 2023-06-30 DIAGNOSIS — C50412 Malignant neoplasm of upper-outer quadrant of left female breast: Secondary | ICD-10-CM | POA: Diagnosis not present

## 2023-06-30 DIAGNOSIS — R2232 Localized swelling, mass and lump, left upper limb: Secondary | ICD-10-CM

## 2023-06-30 DIAGNOSIS — Z170 Estrogen receptor positive status [ER+]: Secondary | ICD-10-CM

## 2023-06-30 MED ORDER — AMOXICILLIN-POT CLAVULANATE 875-125 MG PO TABS: ORAL_TABLET | ORAL | 5 refills | Status: DC

## 2023-07-13 ENCOUNTER — Other Ambulatory Visit: Payer: Managed Care, Other (non HMO)

## 2023-07-22 ENCOUNTER — Inpatient Hospital Stay: Admission: RE | Admit: 2023-07-22 | Payer: Managed Care, Other (non HMO) | Source: Ambulatory Visit

## 2023-08-10 DIAGNOSIS — E1165 Type 2 diabetes mellitus with hyperglycemia: Secondary | ICD-10-CM | POA: Diagnosis not present

## 2023-08-10 DIAGNOSIS — I10 Essential (primary) hypertension: Secondary | ICD-10-CM | POA: Diagnosis not present

## 2023-08-10 DIAGNOSIS — E785 Hyperlipidemia, unspecified: Secondary | ICD-10-CM | POA: Diagnosis not present

## 2023-08-10 DIAGNOSIS — E559 Vitamin D deficiency, unspecified: Secondary | ICD-10-CM | POA: Diagnosis not present

## 2023-08-21 DIAGNOSIS — G5603 Carpal tunnel syndrome, bilateral upper limbs: Secondary | ICD-10-CM | POA: Diagnosis not present

## 2023-08-21 DIAGNOSIS — M1711 Unilateral primary osteoarthritis, right knee: Secondary | ICD-10-CM | POA: Diagnosis not present

## 2023-08-27 DIAGNOSIS — J439 Emphysema, unspecified: Secondary | ICD-10-CM | POA: Diagnosis not present

## 2023-08-27 DIAGNOSIS — R809 Proteinuria, unspecified: Secondary | ICD-10-CM | POA: Diagnosis not present

## 2023-08-27 DIAGNOSIS — F17210 Nicotine dependence, cigarettes, uncomplicated: Secondary | ICD-10-CM | POA: Diagnosis not present

## 2023-08-27 DIAGNOSIS — E1165 Type 2 diabetes mellitus with hyperglycemia: Secondary | ICD-10-CM | POA: Diagnosis not present

## 2023-08-27 DIAGNOSIS — I700 Atherosclerosis of aorta: Secondary | ICD-10-CM | POA: Diagnosis not present

## 2023-08-27 DIAGNOSIS — I10 Essential (primary) hypertension: Secondary | ICD-10-CM | POA: Diagnosis not present

## 2023-08-27 DIAGNOSIS — E785 Hyperlipidemia, unspecified: Secondary | ICD-10-CM | POA: Diagnosis not present

## 2023-08-31 ENCOUNTER — Ambulatory Visit: Payer: Self-pay

## 2023-09-22 ENCOUNTER — Telehealth (HOSPITAL_BASED_OUTPATIENT_CLINIC_OR_DEPARTMENT_OTHER): Payer: Self-pay | Admitting: Physical Therapy

## 2023-09-22 NOTE — Telephone Encounter (Signed)
 Called and LVM to remind patient of upcoming physical therapy evaluation appointment. Requested call back to confirm appointment attendance.

## 2023-09-24 ENCOUNTER — Encounter (HOSPITAL_BASED_OUTPATIENT_CLINIC_OR_DEPARTMENT_OTHER): Payer: Self-pay | Admitting: Physical Therapy

## 2023-09-24 ENCOUNTER — Other Ambulatory Visit: Payer: Self-pay

## 2023-09-24 ENCOUNTER — Ambulatory Visit (HOSPITAL_BASED_OUTPATIENT_CLINIC_OR_DEPARTMENT_OTHER): Attending: Sports Medicine | Admitting: Physical Therapy

## 2023-09-24 DIAGNOSIS — M6281 Muscle weakness (generalized): Secondary | ICD-10-CM | POA: Insufficient documentation

## 2023-09-24 DIAGNOSIS — M25561 Pain in right knee: Secondary | ICD-10-CM | POA: Insufficient documentation

## 2023-09-24 DIAGNOSIS — R2681 Unsteadiness on feet: Secondary | ICD-10-CM | POA: Diagnosis not present

## 2023-09-24 DIAGNOSIS — G8929 Other chronic pain: Secondary | ICD-10-CM | POA: Insufficient documentation

## 2023-09-24 NOTE — Therapy (Signed)
 OUTPATIENT PHYSICAL THERAPY LOWER EXTREMITY EVALUATION   Patient Name: Lisa [name] MRN: [id number] DOB:[birth year], 63 y.o., female Today's Date: 09/28/2023  END OF SESSION:   PT End of Session -     Visit Number 1   Number of Visits    Date for PT Re-Evaluation 11/06/23   Authorization Type [employer] medicare   PT Start Time  1101   PT Stop Time 1140   PT Time Calculation (min) 39 min   Activity Tolerance Pt limited by pain   Behavior During Therapy Anxious/crying      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    Meningitis 06/11/2021   bacterial meningitis   Otitis media 04/22/2022   Polycystic ovarian disease    Vaccine counseling 04/22/2022   Past Surgical History:  Procedure Laterality Date   BREAST LUMPECTOMY Left 08/2021   BREAST LUMPECTOMY WITH SENTINEL LYMPH NODE BIOPSY Left 08/23/2021   Procedure: LEFT BREAST LUMPECTOMY WITH SENTINEL LYMPH NODE BIOPSY;  Surgeon: Lockie Rima, MD;  Location: [facility];  Service: General;  Laterality: Left;   csf leak in skull  06/10/2003   noted failed attempt to close CSF leak in skull   DILATION AND EVACUATION  06/09/1988   miscarriage   IR FLUORO GUIDED NEEDLE PLC ASPIRATION/INJECTION LOC  06/12/2021   RADIOLOGY WITH ANESTHESIA N/A 06/12/2021   Procedure: IR WITH ANESTHESIA;  Surgeon: Radiologist, Medication, MD;  Location: [facility] OR;  Service: Radiology;  Laterality: N/A;   TONSILLECTOMY AND ADENOIDECTOMY  06/10/1971   Patient Active Problem List   Diagnosis Date Noted   Primary osteoarthritis of right knee 08/26/2022    Otitis media 04/22/2022   Seroma of breast 04/22/2022   Vaccine counseling 04/22/2022   Hyperglycemia due to type 2 diabetes mellitus (HCC) 01/21/2022   Degeneration of lumbar intervertebral disc 01/21/2022   Breast hematoma 12/02/2021   Genetic testing 08/22/2021   Family history of breast cancer 08/14/2021   Malignant neoplasm of upper-outer quadrant of left breast in female, estrogen receptor positive (HCC) 08/12/2021   Breast mass, left 07/10/2021   Fever    Bacterial meningitis    Right sided weakness    Sepsis due to Haemophilus influenzae with acute hypoxic respiratory failure and septic shock (HCC)    Acute bacterial meningitis 06/12/2021   Acute metabolic encephalopathy 06/12/2021   Altered mental status 06/12/2021   Obesity, morbid, BMI 40.0-49.9 (HCC) 03/24/2016   Family history of breast cancer in sister 10/21/2013   Tick bite of back 10/21/2012   Screening for malignant neoplasm of cervix 05/19/2012   Encounter for routine gynecological examination 05/19/2012   Tobacco use 12/30/2011   Diabetes mellitus, type 2 (HCC) 12/29/2011   HTN (hypertension) 12/29/2011   Hyperlipidemia 12/29/2011   CSF leak 12/29/2011   Psoriasis 12/29/2011   PCOS (polycystic ovarian syndrome) 12/29/2011   Depression with anxiety 12/29/2011    PCP: Benedetto Brady MD  REFERRING PROVIDER: Wes Hamman, MD   REFERRING DIAG:  M17.11 (ICD-10-CM) - Primary osteoarthritis of right knee  Z68.41 (ICD-10-CM) - Body mass index 40.0-44.9, adult (HCC)    THERAPY DIAG:  Chronic pain of right knee  Muscle weakness (generalized)  Unsteadiness on feet  Rationale for Evaluation and Treatment: Rehabilitation  ONSET DATE: 5 yrs  SUBJECTIVE:   SUBJECTIVE STATEMENT: R knee pain had cortizone injection 1 month ago with little relief. Today is bad.  I can barely stand up. Don't know if I will be able to do this.  I can't put much weight on it.  Pt reports family issues that may create a barrier for her  attending therapy.  Does not have someone to assist her. Knee pain going on for years.  Pt a retired Engineer, civil (consulting)  PERTINENT HISTORY: DJD lumbar spine Br Ca in remission PAIN:  Are you having pain? Yes: NPRS scale:  current 8/10; worst 10/10; least 5/10 Pain location: right knee Pain description: ache sharp jabbing Aggravating factors: walking Relieving factors: resting  PRECAUTIONS: Knee and Fall  RED FLAGS: None   WEIGHT BEARING RESTRICTIONS: No  FALLS:  Has patient fallen in last 6 months? No  LIVING ENVIRONMENT: Lives with: lives with their family Lives in: House/apartment Stairs: No Has following equipment at home: Single point cane, Environmental consultant - 2 wheeled, Environmental consultant - 4 wheeled, Marine scientist  OCCUPATION: retired Engineer, civil (consulting)  PLOF: Requires assistive device for independence and Needs assistance with homemaking  PATIENT GOALS: decrease pain  NEXT MD VISIT: April 30  OBJECTIVE:  Note: Objective measures were completed at Evaluation unless otherwise noted.  DIAGNOSTIC FINDINGS: oa r knee  PATIENT SURVEYS:  LEFS 22/80  COGNITION: Overall cognitive status: Within functional limits for tasks assessed     SENSATION: WFL    POSTURE: rounded shoulders, forward head, and flexed trunk   PALPATION: TTP joint line  LOWER EXTREMITY ROM:  Active ROM Right eval Left eval  Hip flexion    Hip extension    Hip abduction    Hip adduction    Hip internal rotation    Hip external rotation    Knee flexion 116 113  Knee extension -4 0  Ankle dorsiflexion    Ankle plantarflexion    Ankle inversion    Ankle eversion     (Blank rows = not tested)  LOWER EXTREMITY MMT:  MMT Right eval Left eval  Hip flexion 3+ 3+  Hip extension    Hip abduction    Hip adduction    Hip internal rotation    Hip external rotation    Knee flexion 3+ 3+  Knee extension 3+ 3+  Ankle dorsiflexion 4 4  Ankle plantarflexion 4 4  Ankle inversion    Ankle eversion     (Blank rows = not  tested)    FUNCTIONAL TESTS:  Not tolerated  GAIT: Distance walked: 100 ft Assistive device utilized: Walker - 2 wheeled Level of assistance: Modified independence Comments: antalgic.  Pt unable to demonstrate. She walked into building from parking lot with pool toleration.  Sit to stand transfers: from pool bench with min assist; from armed high wc with difficulty Shower transfer  TREATMENT  Eval (modified/as tolerated) Self care: addressed amb and safe functioning in home environment.  Use of FWW vs rollator; transfer training   PATIENT EDUCATION:  Education details: Discussed eval findings, rehab rationale, aquatic program progression/POC and pools in area. Patient is in agreement  Person educated: Patient Education method: Explanation Education comprehension: verbalized understanding  HOME EXERCISE PROGRAM: TBA  ASSESSMENT:  CLINICAL IMPRESSION: Patient is a 63 y.o. f who was seen today for physical therapy evaluation and treatment for oa r knee. She has a long medical history of chronic pain in knees and back.  Not a surgical candidate at this time due to general health and BMI.  She presents today by herself requiring wc  to get to setting.  She is informed of necessity of cg to assist her in getting to and from setting which she reports will not be possible. Her pain sensitivity is high and she struggles with amb using fww. She does not tolerate and functional testing.  MMT demonstrating general diffuse weakness in bilater LE and core.  She is willing to trial aquatic intervention as I do believe it will help her manage her pain and improve strength (with pt's initial concerns about her being able to get here and tolerate).  Will perform more in depth assessment as pt tolerates.    OBJECTIVE IMPAIRMENTS: Abnormal gait, decreased activity tolerance,  decreased balance, decreased endurance, decreased mobility, difficulty walking, decreased ROM, decreased strength, postural dysfunction, obesity, and pain.   ACTIVITY LIMITATIONS: carrying, lifting, bending, sitting, standing, squatting, sleeping, stairs, transfers, and locomotion level  PARTICIPATION LIMITATIONS: meal prep, cleaning, laundry, shopping, community activity, and yard work  PERSONAL FACTORS: Behavior pattern, Fitness, Past/current experiences, Time since onset of injury/illness/exacerbation, and 3+ comorbidities: see pmhx  are also affecting patient's functional outcome.   REHAB POTENTIAL: Fair Pt may not be able to tolerate/feels she may not be able to get here  CLINICAL DECISION MAKING: Evolving/moderate complexity  EVALUATION COMPLEXITY: Moderate   GOALS: Goals reviewed with patient? Yes  SHORT TERM GOALS: Target date: 11/06/23 Pt will tolerate full aquatic sessions consistently without increase in pain and with improving function to demonstrate good toleration and effectiveness of intervention.  Baseline: Goal status: INITIAL  2.  Pt to improve on LEFS by at least 9 point to demonstrate statistically significant Improvement in function. Baseline: 22/80 Goal status: INITIAL  3.  Pt to be able to participate in sessions getting to and from setting with or without assistance Baseline: uncertain Goal status: INITIAL  4.  Pt to tolerate functional testing to identify baseline function Baseline: unable to tolerate Goal status: INITIAL  5.  Pt will be able to negotiate stairs either into and/or out of pool to demonstrate improving LE strength and function Baseline:  Goal status: INITIAL    LONG TERM GOALS: To be added as approp    PLAN:  PT FREQUENCY: 1x/week  PT DURATION: 6 week. 2 week delay due to scheduling conflicts to initiate sessions  PLANNED INTERVENTIONS: 97164- PT Re-evaluation, 97110-Therapeutic exercises, 97530- Therapeutic activity, [id number]-  Neuromuscular re-education, 97535- Self Care, 56213- Manual therapy, [id number]- Gait training, [phone]- Aquatic Therapy, [phone]- Ionotophoresis 4mg /ml Dexamethasone , Patient/Family education, Balance training, Stair training, Taping, Dry Needling, DME instructions, Cryotherapy, and Moist heat  PLAN FOR NEXT SESSION: trial aquatic PT to determine pt toleration to intervention.  Pain management, le strength, gait   Lucinda Saber) Maximus Hoffert MPT 09/28/23 9:54 AM [facility] [address] [place], [place], [address]  Phone: [phone]   Fax:  [phone]  Date of referral: 08/16/22 Referring provider: Wes Hamman, MD  Referring diagnosis? OA right knee Treatment diagnosis? (if different than referring diagnosis) no  What was this (referring dx) caused by? Arthritis  Nature of Condition: Chronic (continuous duration > 3 months)   Laterality: Rt  Current Functional Measure Score: LEFS 22/80  Objective measurements identify impairments when they are compared to normal values, the uninvolved extremity, and prior level of function.  [x]  Yes  []  No  Objective assessment of functional ability: Severe functional limitations   Briefly describe symptoms: r knee pain constant sharp and aching. Limits all  mobility  How did symptoms start: pain  Average pain intensity:  Last 24 hours: 7/10  Past week: 7/10  How often does the pt experience symptoms? Constantly  How much have the symptoms interfered with usual daily activities? Extremely  How has condition changed since care began at this facility? NA - initial visit  In general, how is the patients overall health? Fair   BACK PAIN (STarT Back Screening Tool) No

## 2023-10-14 DIAGNOSIS — R92323 Mammographic fibroglandular density, bilateral breasts: Secondary | ICD-10-CM | POA: Diagnosis not present

## 2023-10-14 DIAGNOSIS — C50412 Malignant neoplasm of upper-outer quadrant of left female breast: Secondary | ICD-10-CM | POA: Diagnosis not present

## 2023-10-14 DIAGNOSIS — Z170 Estrogen receptor positive status [ER+]: Secondary | ICD-10-CM | POA: Diagnosis not present

## 2023-10-14 DIAGNOSIS — Z853 Personal history of malignant neoplasm of breast: Secondary | ICD-10-CM | POA: Diagnosis not present

## 2023-10-14 DIAGNOSIS — R928 Other abnormal and inconclusive findings on diagnostic imaging of breast: Secondary | ICD-10-CM | POA: Diagnosis not present

## 2023-10-29 ENCOUNTER — Ambulatory Visit (HOSPITAL_BASED_OUTPATIENT_CLINIC_OR_DEPARTMENT_OTHER): Attending: Sports Medicine | Admitting: Physical Therapy

## 2023-10-29 DIAGNOSIS — R2681 Unsteadiness on feet: Secondary | ICD-10-CM | POA: Diagnosis not present

## 2023-10-29 DIAGNOSIS — G8929 Other chronic pain: Secondary | ICD-10-CM | POA: Diagnosis not present

## 2023-10-29 DIAGNOSIS — M6281 Muscle weakness (generalized): Secondary | ICD-10-CM | POA: Diagnosis not present

## 2023-10-29 DIAGNOSIS — M25561 Pain in right knee: Secondary | ICD-10-CM | POA: Insufficient documentation

## 2023-10-29 NOTE — Therapy (Signed)
 OUTPATIENT PHYSICAL THERAPY LOWER EXTREMITY TREATMENT   Patient Name: Lisa [name] MRN: [id number] DOB:November 16, 1960, 63 y.o., female Today's Date: 10/29/2023  END OF SESSION:  PT End of Session - 10/29/23 1113     Visit Number 2    Number of Visits 6    Date for PT Re-Evaluation 11/06/23    Authorization Type [employer] medicare    PT Start Time 1100    PT Stop Time 1138    PT Time Calculation (min) 38 min    Activity Tolerance Patient limited by pain              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    Meningitis 06/11/2021   bacterial meningitis   Otitis media 04/22/2022   Polycystic ovarian disease    Vaccine counseling 04/22/2022   Past Surgical History:  Procedure Laterality Date   BREAST LUMPECTOMY Left 08/2021   BREAST LUMPECTOMY WITH SENTINEL LYMPH NODE BIOPSY Left 08/23/2021   Procedure: LEFT BREAST LUMPECTOMY WITH SENTINEL LYMPH NODE BIOPSY;  Surgeon: Lockie Rima, MD;  Location: [facility];  Service: General;  Laterality: Left;   csf leak in skull  06/10/2003   noted failed attempt to close CSF leak in skull   DILATION AND EVACUATION  06/09/1988   miscarriage   IR FLUORO GUIDED NEEDLE PLC ASPIRATION/INJECTION LOC  06/12/2021   RADIOLOGY WITH ANESTHESIA N/A 06/12/2021   Procedure: IR WITH ANESTHESIA;  Surgeon: Radiologist, Medication, MD;  Location: [facility] OR;  Service: Radiology;  Laterality: N/A;   TONSILLECTOMY AND ADENOIDECTOMY  06/10/1971   Patient Active Problem List   Diagnosis Date Noted   Primary osteoarthritis of right knee 08/26/2022   Otitis  media 04/22/2022   Seroma of breast 04/22/2022   Vaccine counseling 04/22/2022   Hyperglycemia due to type 2 diabetes mellitus (HCC) 01/21/2022   Degeneration of lumbar intervertebral disc 01/21/2022   Breast hematoma 12/02/2021   Genetic testing 08/22/2021   Family history of breast cancer 08/14/2021   Malignant neoplasm of upper-outer quadrant of left breast in female, estrogen receptor positive (HCC) 08/12/2021   Breast mass, left 07/10/2021   Fever    Bacterial meningitis    Right sided weakness    Sepsis due to Haemophilus influenzae with acute hypoxic respiratory failure and septic shock (HCC)    Acute bacterial meningitis 06/12/2021   Acute metabolic encephalopathy 06/12/2021   Altered mental status 06/12/2021   Obesity, morbid, BMI 40.0-49.9 (HCC) 03/24/2016   Family history of breast cancer in sister 10/21/2013   Tick bite of back 10/21/2012   Screening for malignant neoplasm of cervix 05/19/2012   Encounter for routine gynecological examination 05/19/2012   Tobacco use 12/30/2011   Diabetes mellitus, type 2 (HCC) 12/29/2011   HTN (hypertension) 12/29/2011   Hyperlipidemia 12/29/2011   CSF leak 12/29/2011   Psoriasis 12/29/2011   PCOS (polycystic ovarian syndrome) 12/29/2011   Depression with anxiety 12/29/2011    PCP: Benedetto Brady MD  REFERRING  PROVIDER: Wes Hamman, MD   REFERRING DIAG:  M17.11 (ICD-10-CM) - Primary osteoarthritis of right knee  Z68.41 (ICD-10-CM) - Body mass index 40.0-44.9, adult (HCC)    THERAPY DIAG:  Chronic pain of right knee  Muscle weakness (generalized)  Unsteadiness on feet  Rationale for Evaluation and Treatment: Rehabilitation  ONSET DATE: 5 yrs  SUBJECTIVE:   SUBJECTIVE STATEMENT: Pt reports concerns of getting her rollator in/out of car and getting to/from pool.  Pt brought son with this visit (lives in [place]) - present on deck.   Initial evaluation: R knee pain had cortizone injection 1 month ago with little  relief. Today is bad.  I can barely stand up. Don't know if I will be able to do this.  I can't put much weight on it.  Pt reports family issues that may create a barrier for her attending therapy.  Does not have someone to assist her. Knee pain going on for years.  Pt a retired Engineer, civil (consulting)  PERTINENT HISTORY: DJD lumbar spine Br Ca in remission PAIN:  Are you having pain? Yes: NPRS scale:  current 7/10; worst 10/10; least 5/10 Pain location: right knee, lower back Pain description: ache sharp jabbing Aggravating factors: walking Relieving factors: resting  PRECAUTIONS: Knee and Fall  RED FLAGS: None   WEIGHT BEARING RESTRICTIONS: No  FALLS:  Has patient fallen in last 6 months? No  LIVING ENVIRONMENT: Lives with: lives with their family Lives in: House/apartment Stairs: No Has following equipment at home: Single point cane, Environmental consultant - 2 wheeled, Environmental consultant - 4 wheeled, Marine scientist  OCCUPATION: retired Engineer, civil (consulting)  PLOF: Requires assistive device for independence and Needs assistance with homemaking  PATIENT GOALS: decrease pain  NEXT MD VISIT: April 30  OBJECTIVE:  Note: Objective measures were completed at Evaluation unless otherwise noted.  DIAGNOSTIC FINDINGS: oa r knee  PATIENT SURVEYS:  LEFS 22/80  COGNITION: Overall cognitive status: Within functional limits for tasks assessed     SENSATION: WFL    POSTURE: rounded shoulders, forward head, and flexed trunk   PALPATION: TTP joint line  LOWER EXTREMITY ROM:  Active ROM Right eval Left eval  Hip flexion    Hip extension    Hip abduction    Hip adduction    Hip internal rotation    Hip external rotation    Knee flexion 116 113  Knee extension -4 0  Ankle dorsiflexion    Ankle plantarflexion    Ankle inversion    Ankle eversion     (Blank rows = not tested)  LOWER EXTREMITY MMT:  MMT Right eval Left eval  Hip flexion 3+ 3+  Hip extension    Hip abduction    Hip adduction    Hip internal  rotation    Hip external rotation    Knee flexion 3+ 3+  Knee extension 3+ 3+  Ankle dorsiflexion 4 4  Ankle plantarflexion 4 4  Ankle inversion    Ankle eversion     (Blank rows = not tested)    FUNCTIONAL TESTS:  Not tolerated  GAIT: Distance walked: 100 ft Assistive device utilized: Walker - 2 wheeled Level of assistance: Modified independence Comments: antalgic.  Pt unable to demonstrate. She walked into building from parking lot with pool toleration.  Sit to stand transfers: from pool bench with min assist; from armed high wc with difficulty Shower transfer  TREATMENT  [facility] Adult PT Treatment:                                            10/29/23  Pt seen for aquatic therapy today.  Treatment took place in water  3.5-4.75 ft in depth at the [facility] pool. Temp of water  was 91.  Pt entered pool backwards with bilat rail/exited the pool via chair lift.  - Intro to aquatic therapy principles - UE on barbell walking forward 1.5 laps - UE on barbell side stepping 1.5 laps - seated in lift chair: cycling, LAQ, hip abdct/ addct - UE on wall:  toe/heel raises x 10; hip add/abd 2 x 5;  hamstring curls x 8 - UE on barbell:  walking backwards 1 width (unsteady, cues for even steps); forward marching 1 width; side stepping L/R with cues for smaller step length 1 lap  Pt requires the buoyancy and hydrostatic pressure of water  for support, and to offload joints by unweighting joint load by at least 50 % in navel deep water  and by at least 75-80% in chest to neck deep water .  Viscosity of the water  is needed for resistance of strengthening. Water  current perturbations provides challenge to standing balance requiring increased core activation.   PATIENT EDUCATION:  Education details: intro to aquatic therapy Person educated: Patient Education method:  Explanation Education comprehension: verbalized understanding  HOME EXERCISE PROGRAM: TBA  ASSESSMENT:  CLINICAL IMPRESSION: Pt demonstrates safety and independence in aquatic setting with therapist instructing from deck. Pt demonstrates confidence in setting, moving through depths of 4 ft or less easily; she requires UE support on wall or floatation.  Pt is directed through various movement patterns and trials in both sitting and standing positions. Pt reported reduction of back pain while in water  until end of session, when it increased. R knee pain remained unchanged.  Will assess response next visit and plan accordingly.   Goals are ongoing.     Initial evaluation Patient is a 63 y.o. f who was seen today for physical therapy evaluation and treatment for oa r knee. She has a long medical history of chronic pain in knees and back.  Not a surgical candidate at this time due to general health and BMI.  She presents today by herself requiring wc  to get to setting.  She is informed of necessity of cg to assist her in getting to and from setting which she reports will not be possible. Her pain sensitivity is high and she struggles with amb using fww. She does not tolerate and functional testing.  MMT demonstrating general diffuse weakness in bilater LE and core.  She is willing to trial aquatic intervention as I do believe it will help her manage her pain and improve strength (with pt's initial concerns about her being able to get here and tolerate).  Will perform more in depth assessment as pt tolerates.    OBJECTIVE IMPAIRMENTS: Abnormal gait, decreased activity tolerance, decreased balance, decreased endurance, decreased mobility, difficulty walking, decreased ROM, decreased strength, postural dysfunction, obesity, and pain.   ACTIVITY LIMITATIONS: carrying, lifting, bending, sitting, standing, squatting, sleeping, stairs, transfers, and locomotion level  PARTICIPATION LIMITATIONS: meal prep,  cleaning, laundry, shopping, community activity, and yard work  PERSONAL FACTORS: Behavior pattern, Fitness, Past/current experiences, Time since onset of injury/illness/exacerbation, and 3+ comorbidities: see pmhx are also affecting patient's functional outcome.  REHAB POTENTIAL: Fair Pt may not be able to tolerate/feels she may not be able to get here  CLINICAL DECISION MAKING: Evolving/moderate complexity  EVALUATION COMPLEXITY: Moderate   GOALS: Goals reviewed with patient? Yes  SHORT TERM GOALS: Target date: 11/06/23 Pt will tolerate full aquatic sessions consistently without increase in pain and with improving function to demonstrate good toleration and effectiveness of intervention.  Baseline: Goal status: INITIAL  2.  Pt to improve on LEFS by at least 9 point to demonstrate statistically significant Improvement in function. Baseline: 22/80 Goal status: INITIAL  3.  Pt to be able to participate in sessions getting to and from setting with or without assistance Baseline: uncertain Goal status: INITIAL  4.  Pt to tolerate functional testing to identify baseline function Baseline: unable to tolerate Goal status: INITIAL  5.  Pt will be able to negotiate stairs either into and/or out of pool to demonstrate improving LE strength and function Baseline:  Goal status: INITIAL    LONG TERM GOALS: To be added as approp    PLAN:  PT FREQUENCY: 1x/week  PT DURATION: 6 week. 2 week delay due to scheduling conflicts to initiate sessions  PLANNED INTERVENTIONS: 97164- PT Re-evaluation, 97110-Therapeutic exercises, 97530- Therapeutic activity, [id number]- Neuromuscular re-education, 97535- Self Care, 16109- Manual therapy, [phone]- Gait training, [phone]- Aquatic Therapy, [phone]- Ionotophoresis 4mg /ml Dexamethasone , Patient/Family education, Balance training, Stair training, Taping, Dry Needling, DME instructions, Cryotherapy, and Moist heat  PLAN FOR NEXT SESSION: trial aquatic PT to  determine pt toleration to intervention.  Pain management, le strength, gait   Lucinda Saber) Ziemba MPT 10/29/23 12:19 PM [facility] [address] [place], [place] 10/29/23 12:19 PM [facility] [address] [place], [place], [address] Phone: [phone]   Fax:  [phone]   Date of referral: 08/16/22 Referring provider: Wes Hamman, MD  Referring diagnosis? OA right knee Treatment diagnosis? (if different than referring diagnosis) no  What was this (referring dx) caused by? Arthritis  Nature of Condition: Chronic (continuous duration > 3 months)   Laterality: Rt  Current Functional Measure Score: LEFS 22/80  Objective measurements identify impairments when they are compared to normal values, the uninvolved extremity, and prior level of function.  [x]  Yes  []  No  Objective assessment of functional ability: Severe functional limitations   Briefly describe symptoms: r knee pain constant sharp and aching. Limits all  mobility  How did symptoms start: pain  Average pain intensity:  Last 24 hours: 7/10  Past week: 7/10  How often does the pt experience symptoms? Constantly  How much have the symptoms interfered with usual daily activities? Extremely  How has condition changed since care began at this facility? NA - initial visit  In general, how is the patients overall health? Fair   BACK PAIN (STarT Back Screening Tool) No

## 2023-11-05 ENCOUNTER — Encounter (HOSPITAL_BASED_OUTPATIENT_CLINIC_OR_DEPARTMENT_OTHER): Payer: Self-pay | Admitting: Physical Therapy

## 2023-11-05 ENCOUNTER — Ambulatory Visit (HOSPITAL_BASED_OUTPATIENT_CLINIC_OR_DEPARTMENT_OTHER): Admitting: Physical Therapy

## 2023-11-05 DIAGNOSIS — G8929 Other chronic pain: Secondary | ICD-10-CM

## 2023-11-05 DIAGNOSIS — M6281 Muscle weakness (generalized): Secondary | ICD-10-CM | POA: Diagnosis not present

## 2023-11-05 DIAGNOSIS — R2681 Unsteadiness on feet: Secondary | ICD-10-CM

## 2023-11-05 DIAGNOSIS — M25561 Pain in right knee: Secondary | ICD-10-CM | POA: Diagnosis not present

## 2023-11-05 NOTE — Therapy (Signed)
 OUTPATIENT PHYSICAL THERAPY LOWER EXTREMITY TREATMENT   Patient Name: Lisa [name] MRN: [id number] DOB:[birth year], 63 y.o., female Today's Date: 11/05/2023  END OF SESSION:  PT End of Session - 11/05/23 1142     Visit Number 3    Number of Visits 16    Date for PT Re-Evaluation 01/01/24    Authorization Type [employer] medicare    PT Start Time 1146    PT Stop Time 1230    PT Time Calculation (min) 44 min    Activity Tolerance Patient limited by pain    Behavior During Therapy [facility] 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    Meningitis 06/11/2021   bacterial meningitis   Otitis media 04/22/2022   Polycystic ovarian disease    Vaccine counseling 04/22/2022   Past Surgical History:  Procedure Laterality Date   BREAST LUMPECTOMY Left 08/2021   BREAST LUMPECTOMY WITH SENTINEL LYMPH NODE BIOPSY Left 08/23/2021   Procedure: LEFT BREAST LUMPECTOMY WITH SENTINEL LYMPH NODE BIOPSY;  Surgeon: Lockie Rima, MD;  Location: [facility];  Service: General;  Laterality: Left;   csf leak in skull  06/10/2003   noted failed attempt to close CSF leak in skull   DILATION AND EVACUATION  06/09/1988   miscarriage   IR FLUORO GUIDED NEEDLE PLC ASPIRATION/INJECTION LOC  06/12/2021   RADIOLOGY WITH ANESTHESIA N/A 06/12/2021   Procedure: IR WITH ANESTHESIA;  Surgeon: Radiologist, Medication, MD;  Location: [facility] OR;  Service: Radiology;  Laterality: N/A;   TONSILLECTOMY AND ADENOIDECTOMY  06/10/1971   Patient Active Problem List   Diagnosis Date Noted    Primary osteoarthritis of right knee 08/26/2022   Otitis media 04/22/2022   Seroma of breast 04/22/2022   Vaccine counseling 04/22/2022   Hyperglycemia due to type 2 diabetes mellitus (HCC) 01/21/2022   Degeneration of lumbar intervertebral disc 01/21/2022   Breast hematoma 12/02/2021   Genetic testing 08/22/2021   Family history of breast cancer 08/14/2021   Malignant neoplasm of upper-outer quadrant of left breast in female, estrogen receptor positive (HCC) 08/12/2021   Breast mass, left 07/10/2021   Fever    Bacterial meningitis    Right sided weakness    Sepsis due to Haemophilus influenzae with acute hypoxic respiratory failure and septic shock (HCC)    Acute bacterial meningitis 06/12/2021   Acute metabolic encephalopathy 06/12/2021   Altered mental status 06/12/2021   Obesity, morbid, BMI 40.0-49.9 (HCC) 03/24/2016   Family history of breast cancer in sister 10/21/2013   Tick bite of back 10/21/2012   Screening for malignant neoplasm of cervix 05/19/2012   Encounter for routine gynecological examination 05/19/2012   Tobacco use 12/30/2011   Diabetes mellitus, type 2 (HCC) 12/29/2011   HTN (hypertension) 12/29/2011   Hyperlipidemia 12/29/2011   CSF leak 12/29/2011   Psoriasis 12/29/2011   PCOS (polycystic ovarian syndrome) 12/29/2011   Depression with anxiety  12/29/2011    PCP: Benedetto Brady MD  REFERRING PROVIDER: Wes Hamman, MD   REFERRING DIAG:  M17.11 (ICD-10-CM) - Primary osteoarthritis of right knee  Z68.41 (ICD-10-CM) - Body mass index 40.0-44.9, adult (HCC)    THERAPY DIAG:  Chronic pain of right knee  Unsteadiness on feet  Muscle weakness (generalized)  Rationale for Evaluation and Treatment: Rehabilitation  ONSET DATE: 5 yrs  SUBJECTIVE:   SUBJECTIVE STATEMENT: Pt reports feeling ok after last/first session. Was able to make the distance walking to and from setting using rollator  Initial evaluation: R knee pain had cortizone injection 1  month ago with little relief. Today is bad.  I can barely stand up. Don't know if I will be able to do this.  I can't put much weight on it.  Pt reports family issues that may create a barrier for her attending therapy.  Does not have someone to assist her. Knee pain going on for years.  Pt a retired Engineer, civil (consulting)  PERTINENT HISTORY: DJD lumbar spine Br Ca in remission PAIN:  Are you having pain? Yes: NPRS scale:  current 7/10; worst 10/10; least 5/10 Pain location: right knee, lower back Pain description: ache sharp jabbing Aggravating factors: walking Relieving factors: resting  PRECAUTIONS: Knee and Fall  RED FLAGS: None   WEIGHT BEARING RESTRICTIONS: No  FALLS:  Has patient fallen in last 6 months? No  LIVING ENVIRONMENT: Lives with: lives with their family Lives in: House/apartment Stairs: No Has following equipment at home: Single point cane, Environmental consultant - 2 wheeled, Environmental consultant - 4 wheeled, Marine scientist  OCCUPATION: retired Engineer, civil (consulting)  PLOF: Requires assistive device for independence and Needs assistance with homemaking  PATIENT GOALS: decrease pain  NEXT MD VISIT: April 30  OBJECTIVE:  Note: Objective measures were completed at Evaluation unless otherwise noted.  DIAGNOSTIC FINDINGS: oa r knee  PATIENT SURVEYS:  LEFS 22/80  COGNITION: Overall cognitive status: Within functional limits for tasks assessed     SENSATION: WFL    POSTURE: rounded shoulders, forward head, and flexed trunk   PALPATION: TTP joint line  LOWER EXTREMITY ROM:  Active ROM Right eval Left eval  Hip flexion    Hip extension    Hip abduction    Hip adduction    Hip internal rotation    Hip external rotation    Knee flexion 116 113  Knee extension -4 0  Ankle dorsiflexion    Ankle plantarflexion    Ankle inversion    Ankle eversion     (Blank rows = not tested)  LOWER EXTREMITY MMT:  MMT Right eval Left eval  Hip flexion 3+ 3+  Hip extension    Hip abduction    Hip adduction     Hip internal rotation    Hip external rotation    Knee flexion 3+ 3+  Knee extension 3+ 3+  Ankle dorsiflexion 4 4  Ankle plantarflexion 4 4  Ankle inversion    Ankle eversion     (Blank rows = not tested)    FUNCTIONAL TESTS:  Not tolerated  GAIT: Distance walked: 100 ft Assistive device utilized: Walker - 2 wheeled Level of assistance: Modified independence Comments: antalgic.  Pt unable to demonstrate. She walked into building from parking lot with pool toleration.  Sit to stand transfers: from pool bench with min assist; from armed high wc with difficulty Shower transfer  TREATMENT  [facility] Adult PT Treatment:                                            11/04/23  Pt seen for aquatic therapy today.  Treatment took place in water  3.5-4.75 ft in depth at the [facility] pool. Temp of water  was 91.  Pt entered pool backwards with bilat rail/exited the pool via chair lift.  -stair negotiation entering pool with ue support hand rails.  Cues for proper technique leading right down/left up - UE on barbell walking 4.2 ft forward 4 laps; backward (some unsteadiness) x 2 laps; side stepping 2 laps. (Pacing slow, movement deliberate). Cues for upright posture, knee flex and heel/toe striking - seated in lift chair: cycling, LAQ, hip abdct/ addct - UE on wall:  toe/heel raises x 10; Marching x10;  hamstring curls x 5   Pt requires the buoyancy and hydrostatic pressure of water  for support, and to offload joints by unweighting joint load by at least 50 % in navel deep water  and by at least 75-80% in chest to neck deep water .  Viscosity of the water  is needed for resistance of strengthening. Water  current perturbations provides challenge to standing balance requiring increased core activation.   PATIENT EDUCATION:  Education details: intro to aquatic  therapy Person educated: Patient Education method: Explanation Education comprehension: verbalized understanding  HOME EXERCISE PROGRAM: TBA  ASSESSMENT:  CLINICAL IMPRESSION: Pt present for only her 2nd session.  Delay in beginning appts due to scheduling conflicts both on our end and hers.  She is due for re-cert. Pt arrives today indep using fww having amb 500 ft from parking lot (reportedly without rest period).  She states she had a good response from 1st/last aquatic session without excessive fatigue or pain.  She is wanting to continue with the aquatics now post trial as she believes she will tolerate weekly sessions that will help progress her towards her goals.      Initial evaluation Patient is a 63 y.o. f who was seen today for physical therapy evaluation and treatment for oa r knee. She has a long medical history of chronic pain in knees and back.  Not a surgical candidate at this time due to general health and BMI.  She presents today by herself requiring wc  to get to setting.  She is informed of necessity of cg to assist her in getting to and from setting which she reports will not be possible. Her pain sensitivity is high and she struggles with amb using fww. She does not tolerate and functional testing.  MMT demonstrating general diffuse weakness in bilater LE and core.  She is willing to trial aquatic intervention as I do believe it will help her manage her pain and improve strength (with pt's initial concerns about her being able to get here and tolerate).  Will perform more in depth assessment as pt tolerates.    OBJECTIVE IMPAIRMENTS: Abnormal gait, decreased activity tolerance, decreased balance, decreased endurance, decreased mobility, difficulty walking, decreased ROM, decreased strength, postural dysfunction, obesity, and pain.   ACTIVITY LIMITATIONS: carrying, lifting, bending, sitting, standing, squatting, sleeping, stairs, transfers, and locomotion  level  PARTICIPATION LIMITATIONS: meal prep, cleaning, laundry, shopping, community activity, and yard work  PERSONAL FACTORS: Behavior pattern, Fitness, Past/current experiences, Time since onset of injury/illness/exacerbation, and 3+ comorbidities: see pmhx are also affecting patient's functional  outcome.   REHAB POTENTIAL: Fair Pt may not be able to tolerate/feels she may not be able to get here  CLINICAL DECISION MAKING: Evolving/moderate complexity  EVALUATION COMPLEXITY: Moderate   GOALS: Goals reviewed with patient? Yes  SHORT TERM GOALS: Target date: 11/06/23 Pt will tolerate full aquatic sessions consistently without increase in pain and with improving function to demonstrate good toleration and effectiveness of intervention.  Baseline: Goal status: In progress 11/05/23  2.  Pt to be able to participate in sessions getting to and from setting with or without assistance Baseline: uncertain Goal status: In progress 11/05/23   3.  Pt will be able to negotiate stairs either into and/or out of pool to demonstrate improving LE strength and function Baseline:  Goal status: Met 11/05/23    LONG TERM GOALS: Target date:01/01/24    1.Pt to tolerate functional testing to identify baseline function Baseline: unable to tolerate Goal status: INITIAL  2.  Pt to improve on LEFS by at least 9 point to demonstrate statistically significant Improvement in function. Baseline: 22/80 Goal status: INITIAL  3. Pt will report decrease in pain by at least 50% for improved toleration to activity/quality of life and to demonstrate improved management of pain. Baseline: see chart Goal status:   4. Pt will improve strength in hips by 1 grade to demonstrate improved overall physical function Baseline: see chart Goal status:   PLAN:  PT FREQUENCY: 1-2  PT DURATION: 8 weeks  PLANNED INTERVENTIONS: 97164- PT Re-evaluation, 97110-Therapeutic exercises, 97530- Therapeutic activity, [id number]-  Neuromuscular re-education, 97535- Self Care, 81191- Manual therapy, [id number]- Gait training, [phone]- Aquatic Therapy, [phone]- Ionotophoresis 4mg /ml Dexamethasone , Patient/Family education, Balance training, Stair training, Taping, Dry Needling, DME instructions, Cryotherapy, and Moist heat  PLAN FOR NEXT SESSION: trial aquatic PT to determine pt toleration to intervention.  Pain management, le strength, gait   Lucinda Saber) Anshul Meddings MPT 11/05/23 12:16 PM [facility] [id number]    Date of referral: 08/16/22 Referring provider: Wes Hamman, MD  Referring diagnosis? OA right knee Treatment diagnosis? (if different than referring diagnosis) no  What was this (referring dx) caused by? Arthritis  Nature of Condition: Chronic (continuous duration > 3 months)   Laterality: Rt  Current Functional Measure Score: LEFS 22/80  Objective measurements identify impairments when they are compared to normal values, the uninvolved extremity, and prior level of function.  [x]  Yes  []  No  Objective assessment of functional ability: Severe functional limitations   Briefly describe symptoms: r knee pain constant sharp and aching. Limits all  mobility  How did symptoms start: pain  Average pain intensity:  Last 24 hours: 7/10  Past week: 7/10  How often does the pt experience symptoms? Constantly  How much have the symptoms interfered with usual daily activities? Extremely  How has condition changed since care began at this facility? NA - initial visit  In general, how is the patients overall health? Fair   BACK PAIN (STarT Back Screening Tool) No

## 2023-11-09 IMAGING — MR MR HEAD W/O CM
4 series · 48 of 48 positions shown · non-contrast
Comparison: 06/13/2021

CLINICAL DATA: Neuro deficit, acute, stroke suspected; new onset
RUE and RLE weakness in patient with bacterial meningitis

EXAM:
MRI HEAD WITHOUT CONTRAST
TECHNIQUE: Multiplanar, multiecho pulse sequences of the brain and surrounding
structures were obtained without intravenous contrast.

[Series 5: DWI · axial · 3.0mm · 0.88mm/px · z∈[-71,+79]mm · 19 of 106 slices shown (1 of 4)]
[im 1/106]
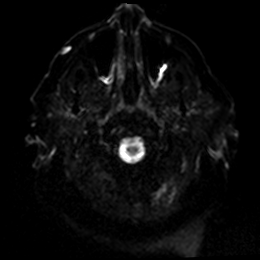
[im 6/106]
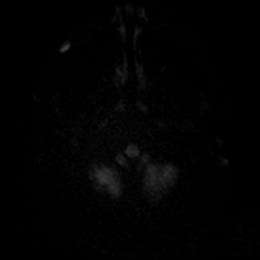
[im 12/106]
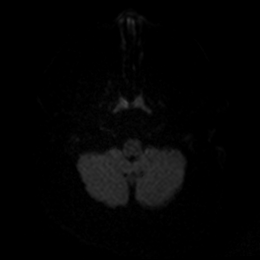
[im 18/106]
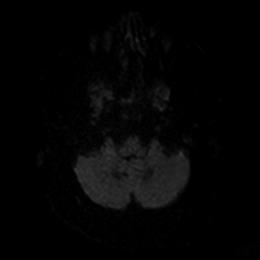
[im 24/106]
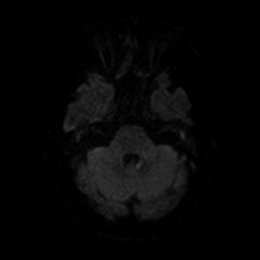
[im 30/106]
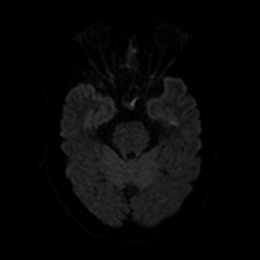
[im 36/106]
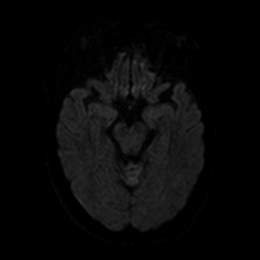
[im 41/106]
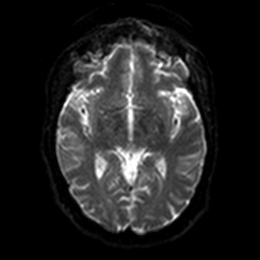
[im 47/106]
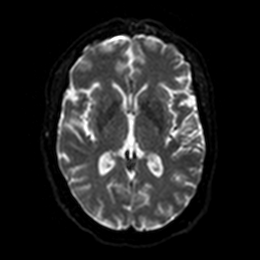
[im 53/106]
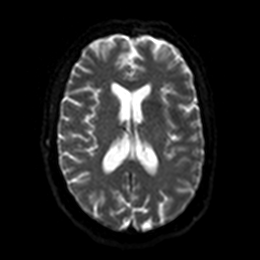
[im 59/106]
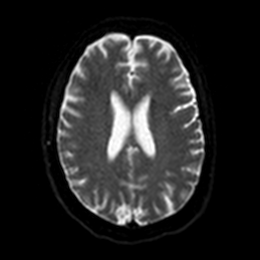
[im 65/106]
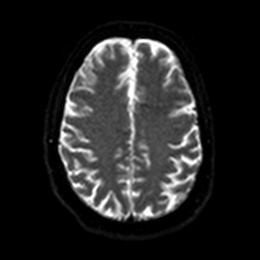
[im 71/106]
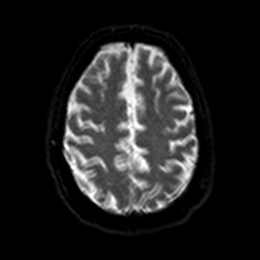
[im 76/106]
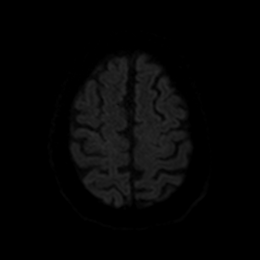
[im 82/106]
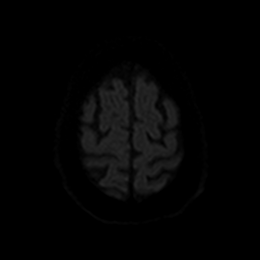
[im 88/106]
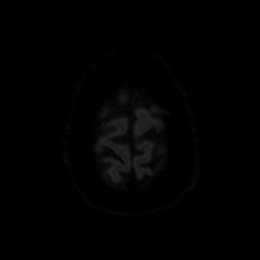
[im 94/106]
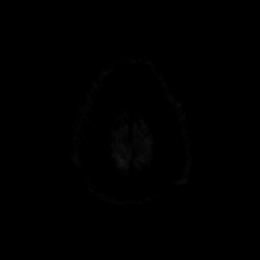
[im 100/106]
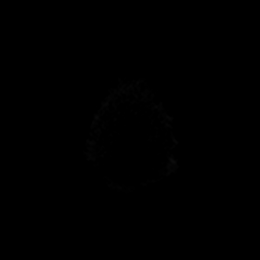
[im 106/106]
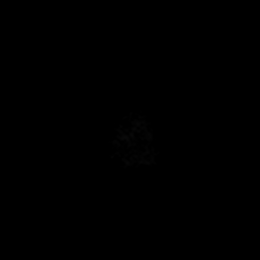

[Series 6: DWI · axial · 3.0mm · 0.88mm/px · z∈[-71,+79]mm · 9 of 53 slices shown (2 of 4)]
[im 1/53]
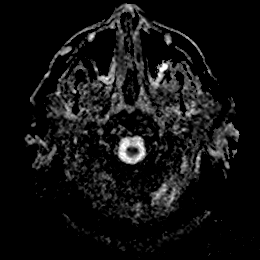
[im 7/53]
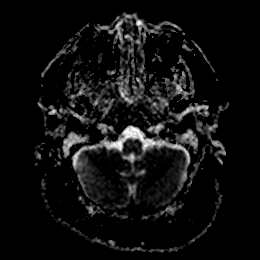
[im 14/53]
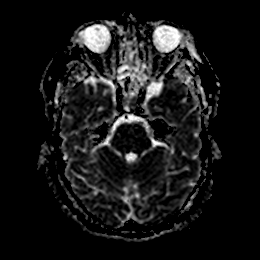
[im 20/53]
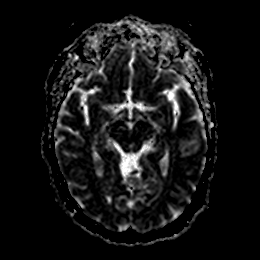
[im 27/53]
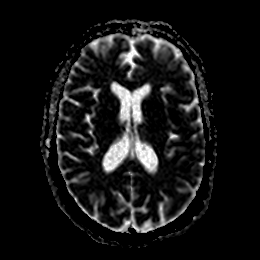
[im 33/53]
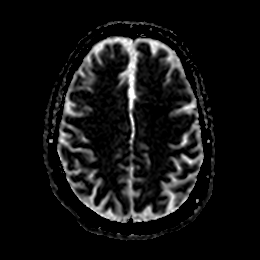
[im 40/53]
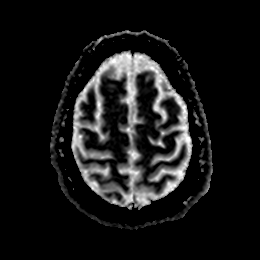
[im 46/53]
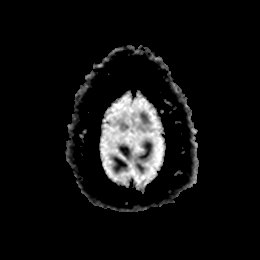
[im 53/53]
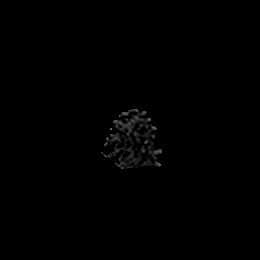

[Series 7: DWI · coronal · 4.0mm · 0.88mm/px · 13 of 76 slices shown (3 of 4)]
[im 1/76]
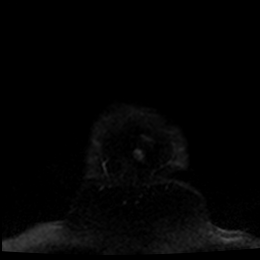
[im 7/76]
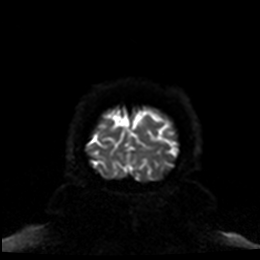
[im 13/76]
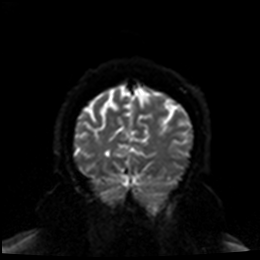
[im 19/76]
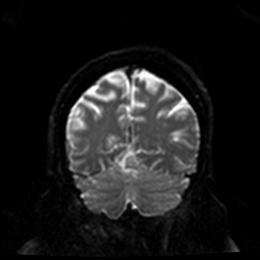
[im 26/76]
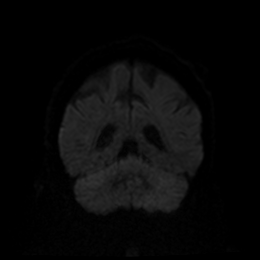
[im 32/76]
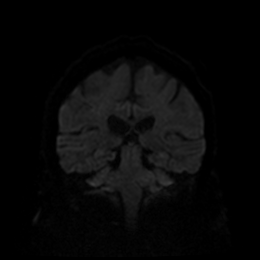
[im 38/76]
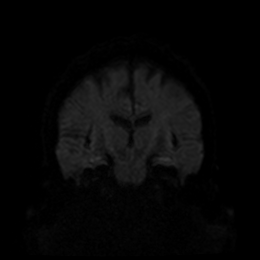
[im 44/76]
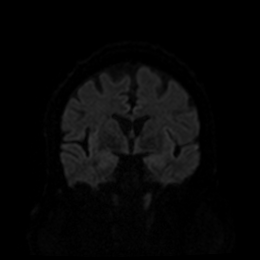
[im 51/76]
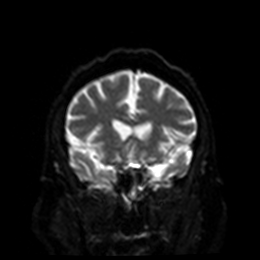
[im 57/76]
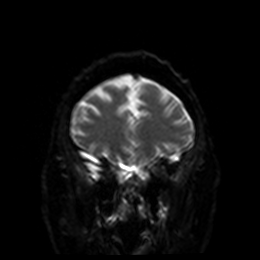
[im 63/76]
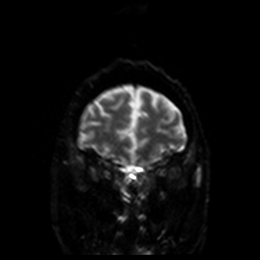
[im 69/76]
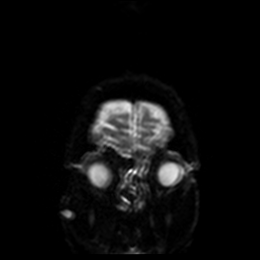
[im 76/76]
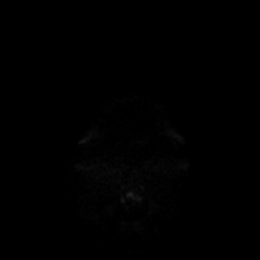

[Series 8: DWI · coronal · 4.0mm · 0.88mm/px · 7 of 38 slices shown (4 of 4)]
[im 1/38]
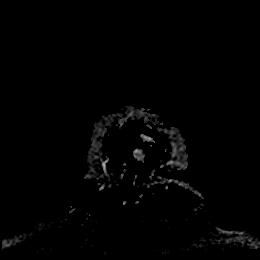
[im 7/38]
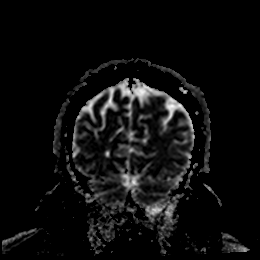
[im 13/38]
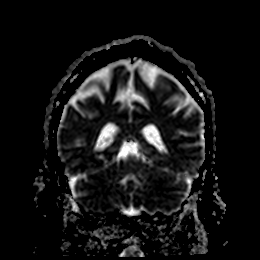
[im 19/38]
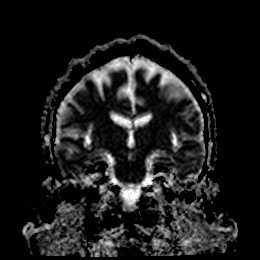
[im 25/38]
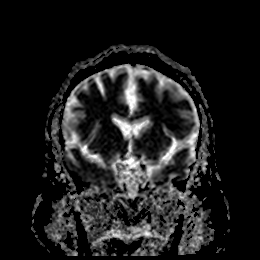
[im 31/38]
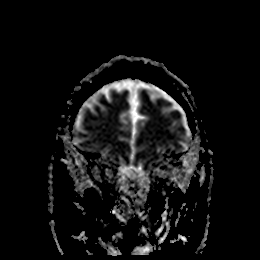
[im 38/38]
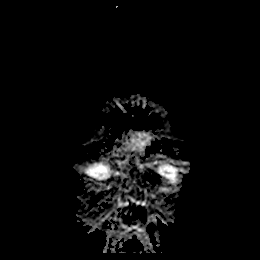

[48 of 48 positions shown; findings below may reference images not displayed]

FINDINGS: Only axial and coronal DWI sequences were obtained. Patient could
not continue with remainder of study.

Suspect trace abnormal diffusion signal in the dependent occipital
horns and dependent fourth ventricle on the left. No acute
infarction. Ventricles are stable in size without hydrocephalus. No
parenchymal edema.
IMPRESSION: Single sequence study.  Patient could not continue with remainder.

Suspect minimal layering debris in the ventricles suggesting
ventriculitis. No hydrocephalus. No parenchymal lesion or edema.

## 2023-11-12 ENCOUNTER — Ambulatory Visit
Admission: RE | Admit: 2023-11-12 | Discharge: 2023-11-12 | Disposition: A | Discharge status: Home / Self Care | Payer: Managed Care, Other (non HMO) | Source: Ambulatory Visit | Attending: Acute Care | Admitting: Acute Care

## 2023-11-12 DIAGNOSIS — Z122 Encounter for screening for malignant neoplasm of respiratory organs: Secondary | ICD-10-CM | POA: Diagnosis not present

## 2023-11-12 DIAGNOSIS — Z87891 Personal history of nicotine dependence: Secondary | ICD-10-CM

## 2023-11-12 DIAGNOSIS — F17210 Nicotine dependence, cigarettes, uncomplicated: Secondary | ICD-10-CM | POA: Diagnosis not present

## 2023-11-13 ENCOUNTER — Encounter (HOSPITAL_BASED_OUTPATIENT_CLINIC_OR_DEPARTMENT_OTHER): Payer: Self-pay | Admitting: Physical Therapy

## 2023-11-13 ENCOUNTER — Ambulatory Visit (HOSPITAL_BASED_OUTPATIENT_CLINIC_OR_DEPARTMENT_OTHER): Attending: Sports Medicine | Admitting: Physical Therapy

## 2023-11-13 DIAGNOSIS — G8929 Other chronic pain: Secondary | ICD-10-CM | POA: Diagnosis not present

## 2023-11-13 DIAGNOSIS — M6281 Muscle weakness (generalized): Secondary | ICD-10-CM

## 2023-11-13 DIAGNOSIS — M25561 Pain in right knee: Secondary | ICD-10-CM | POA: Insufficient documentation

## 2023-11-13 DIAGNOSIS — R2681 Unsteadiness on feet: Secondary | ICD-10-CM

## 2023-11-13 IMAGING — MR MR SHOULDER*R* WO/W CM
8 series · 39 of 40 positions shown · IV contrast (gadavist)
Comparison: None.

CLINICAL DATA: Septic arthritis suspected, shoulder, xray done

EXAM:
MRI OF THE RIGHT SHOULDER WITHOUT AND WITH CONTRAST
TECHNIQUE: Multiplanar, multisequence MR imaging of the shoulder was performed
before and after the administration of intravenous contrast.
CONTRAST:  10mL GADAVIST GADOBUTROL 1 MMOL/ML IV SOLN

[Series 9: T2 fat-sat · axial · right · 4.0mm · 0.36mm/px · z∈[-57,+44]mm · 4 of 22 slices shown (1 of 3)]
[im 1/22]
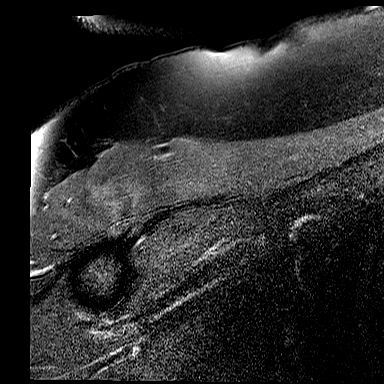
[im 8/22]
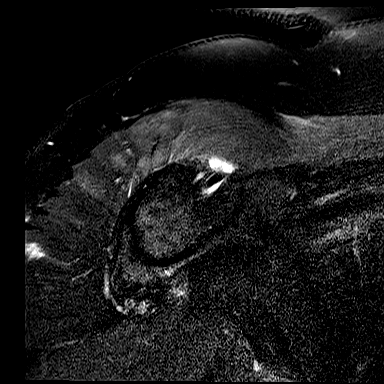
[im 15/22]
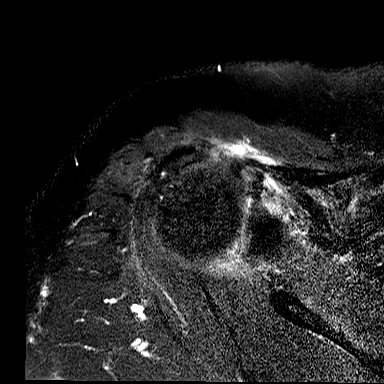
[im 22/22]
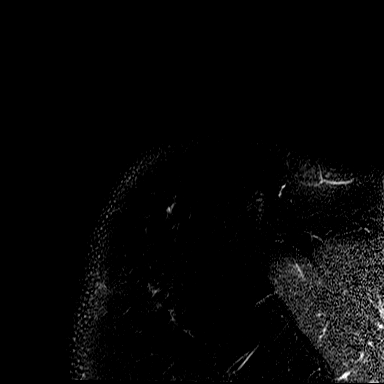

[Series 10: T2 fat-sat · oblique · right · 4.0mm · 0.44mm/px · 4 of 18 slices shown (2 of 3)]
[im 1/18]
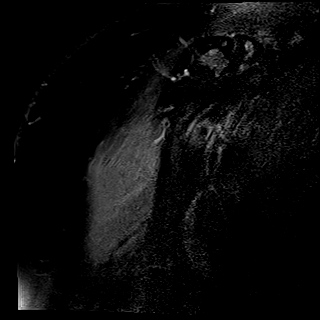
[im 6/18]
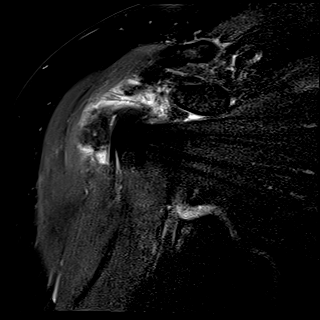
[im 12/18]
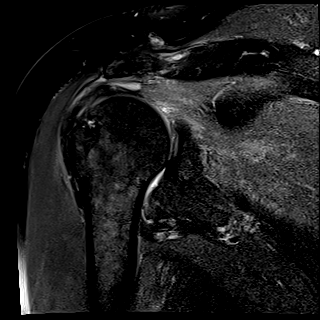
[im 18/18]
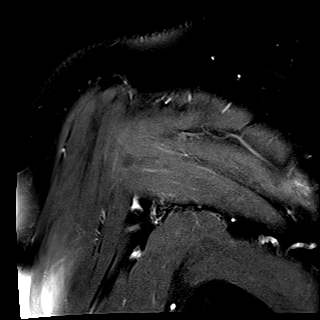

[Series 11: PD fat-sat · oblique · right · 4.0mm · 0.39mm/px · 4 of 18 slices shown]
[im 1/18]
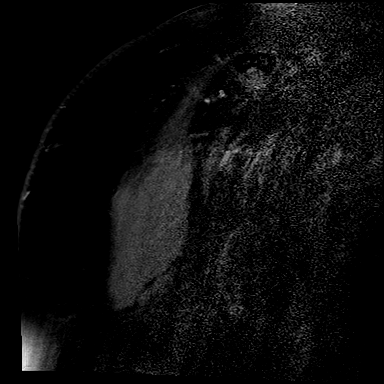
[im 6/18]
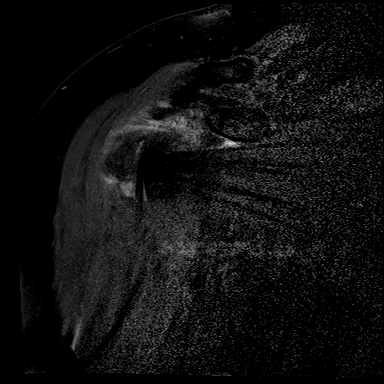
[im 12/18]
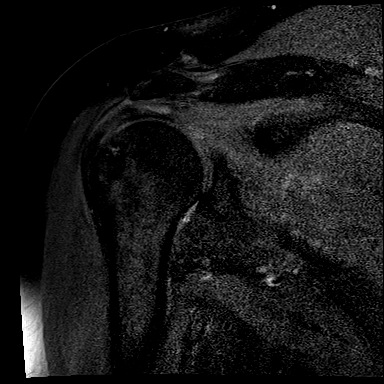
[im 18/18]
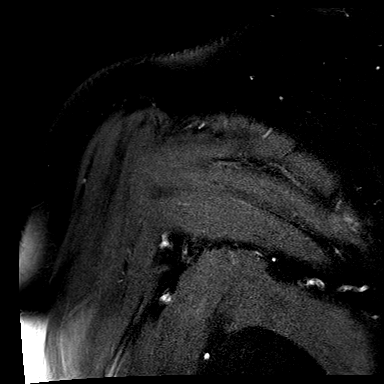

[Series 12: T2 fat-sat · oblique · right · 4.0mm · 0.44mm/px · 6 of 25 slices shown (3 of 3)]
[im 1/25]
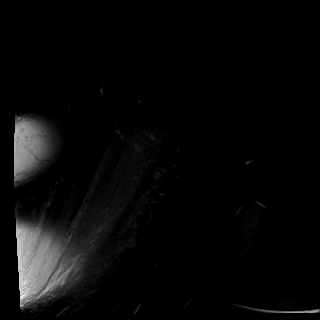
[im 5/25]
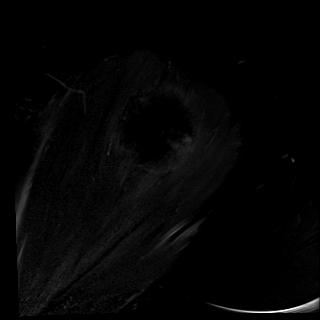
[im 10/25]
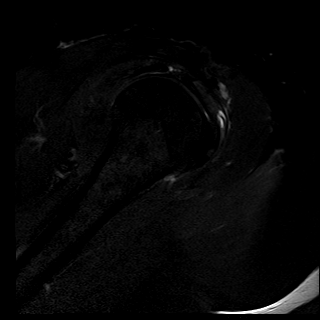
[im 15/25]
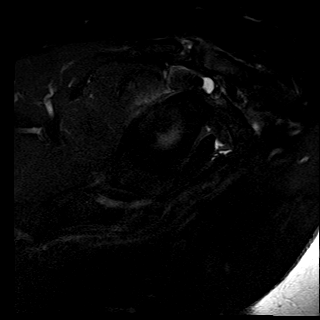
[im 20/25]
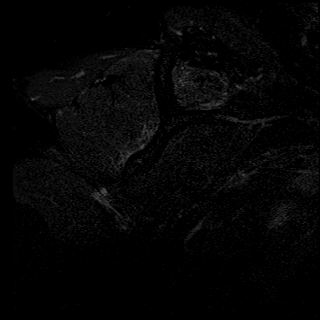
[im 25/25]
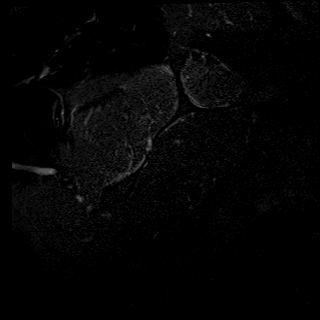

[Series 13: T1 · oblique · right · 4.0mm · 0.36mm/px · 6 of 25 slices shown]
[im 1/25]
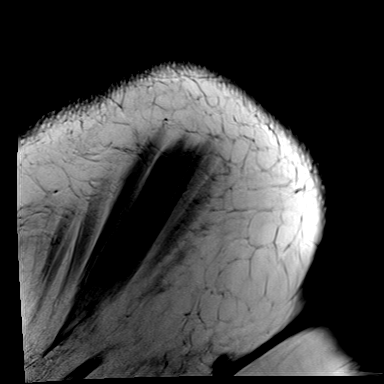
[im 5/25]
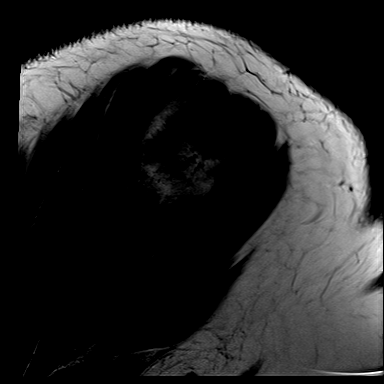
[im 10/25]
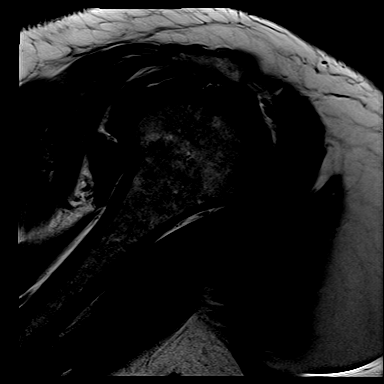
[im 15/25]
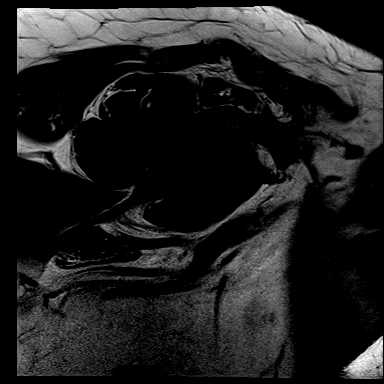
[im 20/25]
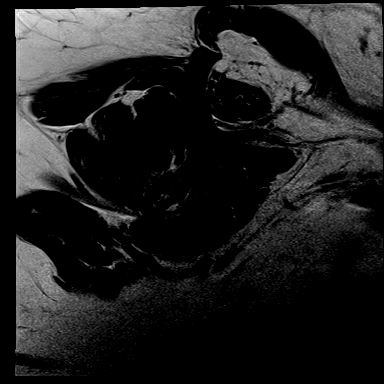
[im 25/25]
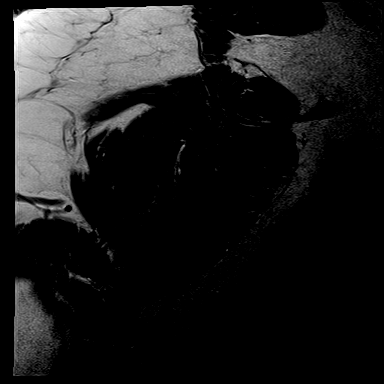

[Series 14: T1 fat-sat · axial · non-contrast · right · 4.0mm · 0.55mm/px · z∈[-64,+51]mm · 6 of 25 slices shown]
[im 1/25]
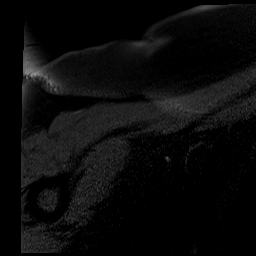
[im 5/25]
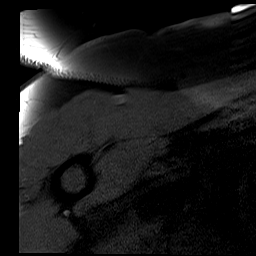
[im 10/25]
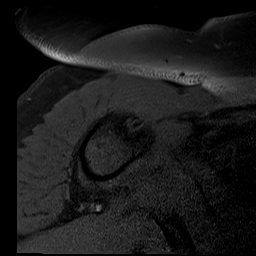
[im 15/25]
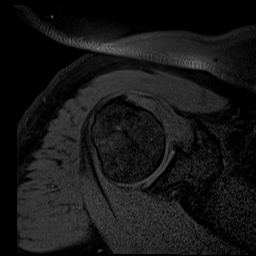
[im 20/25]
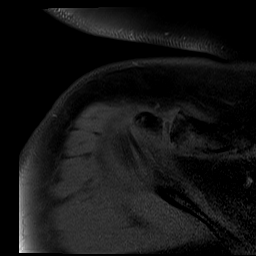
[im 25/25]
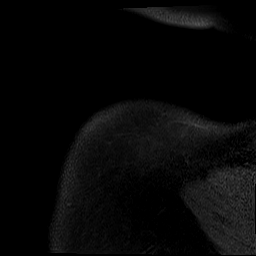

[Series 15: T1 fat-sat post-contrast · axial · right · 4.0mm · 0.55mm/px · z∈[-64,+51]mm · 6 of 25 slices shown (1 of 2)]
[im 1/25]
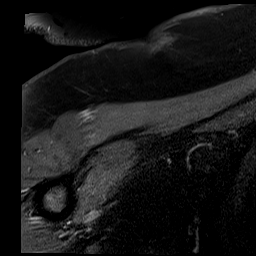
[im 5/25]
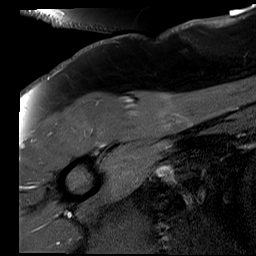
[im 10/25]
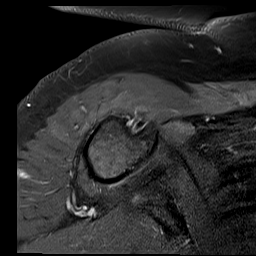
[im 15/25]
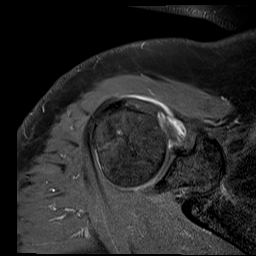
[im 20/25]
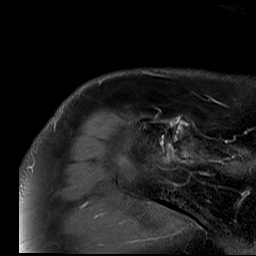
[im 25/25]
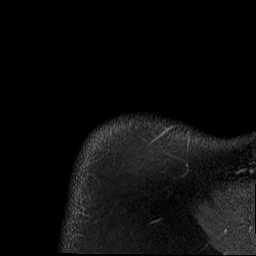

[Series 16: T1 fat-sat post-contrast · oblique · right · 4.0mm · 0.27mm/px · 3 of 18 slices shown (2 of 2)]
[im 1/18]
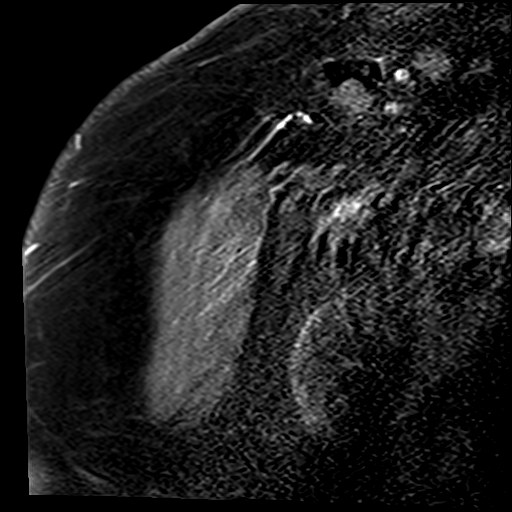
[im 6/18]
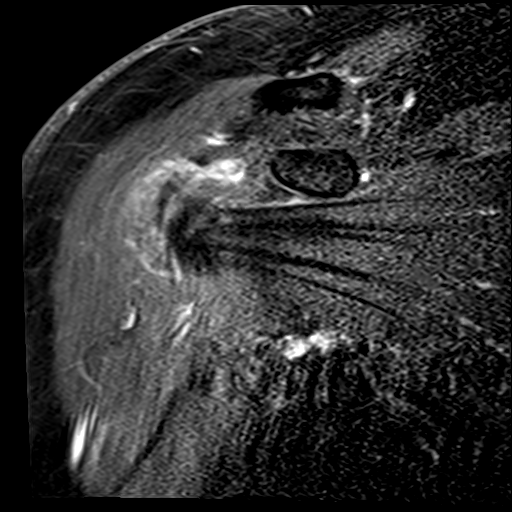
[im 12/18]
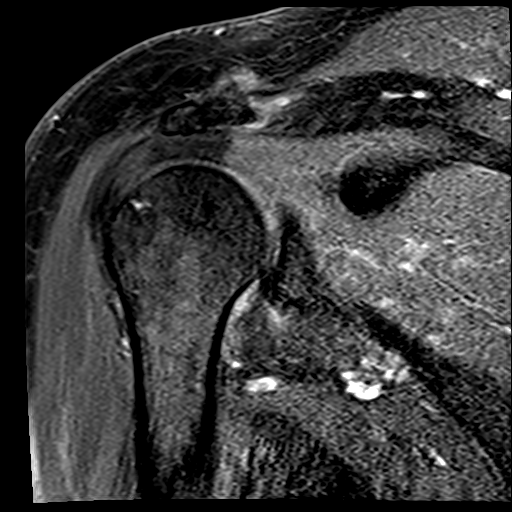

[39 of 40 positions shown; findings below may reference images not displayed]

FINDINGS: Rotator cuff: There is mild distal supraspinatus infraspinatus
tendinosis. There is focal high grade, partial width tearing of the
far anterior fibers of the supraspinatus tendon at the footprint
(coronal T2 image 12. Teres minor tendon is intact. Subscapularis
tendon is intact.

Muscles: No significant muscle atrophy. Streaky muscle edema in the
anterior and mid fibers of the deltoid.

Biceps Long Head: Intraarticular and extraarticular portions of the
biceps tendon are intact.

Acromioclavicular Joint: Moderate arthropathy of the
acromioclavicular joint. Small amount of subacromial/subdeltoid
bursal fluid.

Glenohumeral Joint: No significant joint effusion.  Mild chondrosis.

Labrum: Grossly intact, but evaluation is limited by lack of
intraarticular fluid/contrast.

Bones: No fracture or dislocation. No aggressive osseous lesion.
Mild generalized low T1 marrow signal, as can be seen in hematologic
derangement.

Other: No fluid collection or hematoma.
IMPRESSION: Mild distal supraspinatus and infraspinatus tendinosis. Focal
high-grade, partial width tearing of the far anterior fibers of the
supraspinatus tendon at the footprint. Mild subacromial-subdeltoid
bursitis.

No convincing findings to suggest septic arthritis. No significant
joint effusion or evidence of osteomyelitis.

Mild glenohumeral and moderate acromioclavicular joint
osteoarthritis.

Mild generalized low T1 marrow signal which, can be seen in the
setting of hematologic derangement including anemia, correlate with
CBC.

## 2023-11-13 NOTE — Therapy (Signed)
 OUTPATIENT PHYSICAL THERAPY LOWER EXTREMITY TREATMENT   Patient Name: Lisa [name] MRN: [id number] DOB:[birth year], 63 y.o., female Today's Date: 11/13/2023  END OF SESSION:  PT End of Session - 11/13/23 1023     Visit Number 4    Number of Visits 16    Date for PT Re-Evaluation 01/01/24    Authorization Type [employer] medicare    PT Start Time 1016    PT Stop Time 1055    PT Time Calculation (min) 39 min    Activity Tolerance Patient limited by pain    Behavior During Therapy [facility] 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    Meningitis 06/11/2021   bacterial meningitis   Otitis media 04/22/2022   Polycystic ovarian disease    Vaccine counseling 04/22/2022   Past Surgical History:  Procedure Laterality Date   BREAST LUMPECTOMY Left 08/2021   BREAST LUMPECTOMY WITH SENTINEL LYMPH NODE BIOPSY Left 08/23/2021   Procedure: LEFT BREAST LUMPECTOMY WITH SENTINEL LYMPH NODE BIOPSY;  Surgeon: Lockie Rima, MD;  Location: [facility];  Service: General;  Laterality: Left;   csf leak in skull  06/10/2003   noted failed attempt to close CSF leak in skull   DILATION AND EVACUATION  06/09/1988   miscarriage   IR FLUORO GUIDED NEEDLE PLC ASPIRATION/INJECTION LOC  06/12/2021   RADIOLOGY WITH ANESTHESIA N/A 06/12/2021   Procedure: IR WITH ANESTHESIA;  Surgeon: Radiologist, Medication, MD;  Location: [facility] OR;  Service: Radiology;  Laterality: N/A;   TONSILLECTOMY AND ADENOIDECTOMY  06/10/1971   Patient Active Problem List   Diagnosis Date Noted    Primary osteoarthritis of right knee 08/26/2022   Otitis media 04/22/2022   Seroma of breast 04/22/2022   Vaccine counseling 04/22/2022   Hyperglycemia due to type 2 diabetes mellitus (HCC) 01/21/2022   Degeneration of lumbar intervertebral disc 01/21/2022   Breast hematoma 12/02/2021   Genetic testing 08/22/2021   Family history of breast cancer 08/14/2021   Malignant neoplasm of upper-outer quadrant of left breast in female, estrogen receptor positive (HCC) 08/12/2021   Breast mass, left 07/10/2021   Fever    Bacterial meningitis    Right sided weakness    Sepsis due to Haemophilus influenzae with acute hypoxic respiratory failure and septic shock (HCC)    Acute bacterial meningitis 06/12/2021   Acute metabolic encephalopathy 06/12/2021   Altered mental status 06/12/2021   Obesity, morbid, BMI 40.0-49.9 (HCC) 03/24/2016   Family history of breast cancer in sister 10/21/2013   Tick bite of back 10/21/2012   Screening for malignant neoplasm of cervix 05/19/2012   Encounter for routine gynecological examination 05/19/2012   Tobacco use 12/30/2011   Diabetes mellitus, type 2 (HCC) 12/29/2011   HTN (hypertension) 12/29/2011   Hyperlipidemia 12/29/2011   CSF leak 12/29/2011   Psoriasis 12/29/2011   PCOS (polycystic ovarian syndrome) 12/29/2011   Depression with anxiety  12/29/2011    PCP: Benedetto Brady MD  REFERRING PROVIDER: Wes Hamman, MD   REFERRING DIAG:  M17.11 (ICD-10-CM) - Primary osteoarthritis of right knee  Z68.41 (ICD-10-CM) - Body mass index 40.0-44.9, adult (HCC)    THERAPY DIAG:  Chronic pain of right knee  Unsteadiness on feet  Muscle weakness (generalized)  Rationale for Evaluation and Treatment: Rehabilitation  ONSET DATE: 5 yrs  SUBJECTIVE:   SUBJECTIVE STATEMENT: Pt arrives via WC.  States she is hurting some but OK.  Has had a pretty good week."  Initial evaluation: R knee pain had cortizone injection 1 month ago with little relief. Today is  bad.  I can barely stand up. Don't know if I will be able to do this.  I can't put much weight on it.  Pt reports family issues that may create a barrier for her attending therapy.  Does not have someone to assist her. Knee pain going on for years.  Pt a retired Engineer, civil (consulting)  PERTINENT HISTORY: DJD lumbar spine Br Ca in remission PAIN:  Are you having pain? Yes: NPRS scale:  current 8/10; worst 10/10; least 5/10 Pain location: right knee, lower back Pain description: ache sharp jabbing Aggravating factors: walking Relieving factors: resting  PRECAUTIONS: Knee and Fall  RED FLAGS: None   WEIGHT BEARING RESTRICTIONS: No  FALLS:  Has patient fallen in last 6 months? No  LIVING ENVIRONMENT: Lives with: lives with their family Lives in: House/apartment Stairs: No Has following equipment at home: Single point cane, Environmental consultant - 2 wheeled, Environmental consultant - 4 wheeled, Marine scientist  OCCUPATION: retired Engineer, civil (consulting)  PLOF: Requires assistive device for independence and Needs assistance with homemaking  PATIENT GOALS: decrease pain  NEXT MD VISIT: April 30  OBJECTIVE:  Note: Objective measures were completed at Evaluation unless otherwise noted.  DIAGNOSTIC FINDINGS: oa r knee  PATIENT SURVEYS:  LEFS 22/80  COGNITION: Overall cognitive status: Within functional limits for tasks assessed     SENSATION: WFL    POSTURE: rounded shoulders, forward head, and flexed trunk   PALPATION: TTP joint line  LOWER EXTREMITY ROM:  Active ROM Right eval Left eval  Hip flexion    Hip extension    Hip abduction    Hip adduction    Hip internal rotation    Hip external rotation    Knee flexion 116 113  Knee extension -4 0  Ankle dorsiflexion    Ankle plantarflexion    Ankle inversion    Ankle eversion     (Blank rows = not tested)  LOWER EXTREMITY MMT:  MMT Right eval Left eval  Hip flexion 3+ 3+  Hip extension    Hip abduction    Hip adduction    Hip internal rotation    Hip  external rotation    Knee flexion 3+ 3+  Knee extension 3+ 3+  Ankle dorsiflexion 4 4  Ankle plantarflexion 4 4  Ankle inversion    Ankle eversion     (Blank rows = not tested)    FUNCTIONAL TESTS:  Not tolerated  GAIT: Distance walked: 100 ft Assistive device utilized: Walker - 2 wheeled Level of assistance: Modified independence Comments: antalgic.  Pt unable to demonstrate. She walked into building from parking lot with pool toleration.  Sit to stand transfers: from pool bench with min assist; from armed high wc with difficulty Shower transfer  TREATMENT  [facility] Adult PT Treatment:                                            11/13/23  Pt seen for aquatic therapy today.  Treatment took place in water  3.5-4.75 ft in depth at the [facility] pool. Temp of water  was 91.  Pt entered pool backwards with bilat rail/exited the pool via chair lift.  -stair negotiation entering pool with ue support hand rails.  Cues for proper technique leading right down/left up - UE on barbell walking 4.2 ft forward 4 laps; backward (some unsteadiness) x 2 laps; side stepping 2 laps. (Pacing slow, movement deliberate). Cues for upright posture, knee flex and heel/toe striking - seated in lift chair: cycling, LAQ, hip abdct/ addct - UE on wall 3.6 ft:  toe/heel raises x 10; Marching 2x10;  hamstring curls x 10 -Balance ue support barbell then yellow HB: FT and semi tandem leading R/L x20s; tandem after several tries x 15s -tandem walking forward x 2 widths ue support barbell. -rest periods leaning up against frequent throughout.   Pt requires the buoyancy and hydrostatic pressure of water  for support, and to offload joints by unweighting joint load by at least 50 % in navel deep water  and by at least 75-80% in chest to neck deep water .  Viscosity of the water  is needed for  resistance of strengthening. Water  current perturbations provides challenge to standing balance requiring increased core activation.   PATIENT EDUCATION:  Education details: intro to aquatic therapy Person educated: Patient Education method: Explanation Education comprehension: verbalized understanding  HOME EXERCISE PROGRAM: TBA  ASSESSMENT:  CLINICAL IMPRESSION: Pt states her pain is a little higher but she has had a good week. She did have mild right planter foot discomfort post session likely due to time spent standing/moving during session. Progressed session today to included some balance and proprioceptive retraining challenges.  Good toleration. Pt reports she feels mentally better with socialization of therapy sessions. Goals ongoing    Initial evaluation Patient is a 63 y.o. f who was seen today for physical therapy evaluation and treatment for oa r knee. She has a long medical history of chronic pain in knees and back.  Not a surgical candidate at this time due to general health and BMI.  She presents today by herself requiring wc  to get to setting.  She is informed of necessity of cg to assist her in getting to and from setting which she reports will not be possible. Her pain sensitivity is high and she struggles with amb using fww. She does not tolerate and functional testing.  MMT demonstrating general diffuse weakness in bilater LE and core.  She is willing to trial aquatic intervention as I do believe it will help her manage her pain and improve strength (with pt's initial concerns about her being able to get here and tolerate).  Will perform more in depth assessment as pt tolerates.    OBJECTIVE IMPAIRMENTS: Abnormal gait, decreased activity tolerance, decreased balance, decreased endurance, decreased mobility, difficulty walking, decreased ROM, decreased strength, postural dysfunction, obesity, and pain.   ACTIVITY LIMITATIONS: carrying, lifting, bending, sitting, standing,  squatting, sleeping, stairs, transfers, and locomotion level  PARTICIPATION LIMITATIONS: meal prep, cleaning, laundry, shopping, community activity, and yard work  PERSONAL FACTORS: Behavior pattern, Fitness, Past/current experiences, Time since onset of injury/illness/exacerbation, and 3+ comorbidities: see  pmhx are also affecting patient's functional outcome.   REHAB POTENTIAL: Fair Pt may not be able to tolerate/feels she may not be able to get here  CLINICAL DECISION MAKING: Evolving/moderate complexity  EVALUATION COMPLEXITY: Moderate   GOALS: Goals reviewed with patient? Yes  SHORT TERM GOALS: Target date: 11/06/23 Pt will tolerate full aquatic sessions consistently without increase in pain and with improving function to demonstrate good toleration and effectiveness of intervention.  Baseline: Goal status: In progress 11/05/23  2.  Pt to be able to participate in sessions getting to and from setting with or without assistance Baseline: uncertain Goal status: In progress 11/05/23   3.  Pt will be able to negotiate stairs either into and/or out of pool to demonstrate improving LE strength and function Baseline:  Goal status: Met 11/05/23    LONG TERM GOALS: Target date:01/01/24    1.Pt to tolerate functional testing to identify baseline function Baseline: unable to tolerate Goal status: INITIAL  2.  Pt to improve on LEFS by at least 9 point to demonstrate statistically significant Improvement in function. Baseline: 22/80 Goal status: INITIAL  3. Pt will report decrease in pain by at least 50% for improved toleration to activity/quality of life and to demonstrate improved management of pain. Baseline: see chart Goal status:   4. Pt will improve strength in hips by 1 grade to demonstrate improved overall physical function Baseline: see chart Goal status:   PLAN:  PT FREQUENCY: 1-2  PT DURATION: 8 weeks  PLANNED INTERVENTIONS: 97164- PT Re-evaluation,  97110-Therapeutic exercises, 97530- Therapeutic activity, [id number]- Neuromuscular re-education, 97535- Self Care, 16109- Manual therapy, [id number]- Gait training, [phone]- Aquatic Therapy, [phone]- Ionotophoresis 4mg /ml Dexamethasone , Patient/Family education, Balance training, Stair training, Taping, Dry Needling, DME instructions, Cryotherapy, and Moist heat  PLAN FOR NEXT SESSION: trial aquatic PT to determine pt toleration to intervention.  Pain management, le strength, gait   Lucinda Saber) Lillard Bailon MPT 11/13/23 10:26 AM [facility] [address]    Date of referral: 08/16/22 Referring provider: Wes Hamman, MD  Referring diagnosis? OA right knee Treatment diagnosis? (if different than referring diagnosis) no  What was this (referring dx) caused by? Arthritis  Nature of Condition: Chronic (continuous duration > 3 months)   Laterality: Rt  Current Functional Measure Score: LEFS 22/80  Objective measurements identify impairments when they are compared to normal values, the uninvolved extremity, and prior level of function.  [x]  Yes  []  No  Objective assessment of functional ability: Severe functional limitations   Briefly describe symptoms: r knee pain constant sharp and aching. Limits all  mobility  How did symptoms start: pain  Average pain intensity:  Last 24 hours: 7/10  Past week: 7/10  How often does the pt experience symptoms? Constantly  How much have the symptoms interfered with usual daily activities? Extremely  How has condition changed since care began at this facility? Slightly better  In general, how is the patients overall health? Fair   BACK PAIN (STarT Back Screening Tool) No

## 2023-11-17 ENCOUNTER — Ambulatory Visit (HOSPITAL_BASED_OUTPATIENT_CLINIC_OR_DEPARTMENT_OTHER): Payer: Self-pay | Admitting: Physical Therapy

## 2023-11-20 ENCOUNTER — Ambulatory Visit (HOSPITAL_BASED_OUTPATIENT_CLINIC_OR_DEPARTMENT_OTHER): Payer: Self-pay | Admitting: Physical Therapy

## 2023-11-24 ENCOUNTER — Ambulatory Visit (HOSPITAL_BASED_OUTPATIENT_CLINIC_OR_DEPARTMENT_OTHER): Admitting: Physical Therapy

## 2023-11-27 ENCOUNTER — Telehealth: Payer: Self-pay

## 2023-11-27 NOTE — Telephone Encounter (Signed)
 Copied from CRM [id number]. Topic: Clinical - Lab/Test Results >> Nov 27, 2023  1:45 PM Hilton Lucky wrote: Reason for CRM: Patient is upset and finds it unacceptable that there are not yet results for her CT Cancer Screening. Patient is aware of issues with radiologist but still finds it to be unacceptable. Please reach out to patient when results are in.  Patient wants this report to be sent to her PCP with [facility] prior to her appointment on 06/30.      Spoke with patient that we do not have any control over another department turn around  time and as soon as the result have been seen by MD she will get a call back.    NFN

## 2023-11-30 ENCOUNTER — Other Ambulatory Visit: Payer: Self-pay

## 2023-11-30 DIAGNOSIS — Z87891 Personal history of nicotine dependence: Secondary | ICD-10-CM

## 2023-11-30 DIAGNOSIS — F17210 Nicotine dependence, cigarettes, uncomplicated: Secondary | ICD-10-CM

## 2023-11-30 DIAGNOSIS — Z122 Encounter for screening for malignant neoplasm of respiratory organs: Secondary | ICD-10-CM

## 2023-12-03 DIAGNOSIS — E559 Vitamin D deficiency, unspecified: Secondary | ICD-10-CM | POA: Diagnosis not present

## 2023-12-03 DIAGNOSIS — E1165 Type 2 diabetes mellitus with hyperglycemia: Secondary | ICD-10-CM | POA: Diagnosis not present

## 2023-12-07 ENCOUNTER — Other Ambulatory Visit (HOSPITAL_COMMUNITY): Payer: Self-pay | Admitting: Family Medicine

## 2023-12-07 DIAGNOSIS — I2510 Atherosclerotic heart disease of native coronary artery without angina pectoris: Secondary | ICD-10-CM | POA: Diagnosis not present

## 2023-12-07 DIAGNOSIS — M542 Cervicalgia: Secondary | ICD-10-CM | POA: Diagnosis not present

## 2023-12-07 DIAGNOSIS — M1990 Unspecified osteoarthritis, unspecified site: Secondary | ICD-10-CM | POA: Diagnosis not present

## 2023-12-07 DIAGNOSIS — G4700 Insomnia, unspecified: Secondary | ICD-10-CM | POA: Diagnosis not present

## 2023-12-07 DIAGNOSIS — G4733 Obstructive sleep apnea (adult) (pediatric): Secondary | ICD-10-CM | POA: Diagnosis not present

## 2023-12-07 DIAGNOSIS — E785 Hyperlipidemia, unspecified: Secondary | ICD-10-CM | POA: Diagnosis not present

## 2023-12-07 DIAGNOSIS — F17210 Nicotine dependence, cigarettes, uncomplicated: Secondary | ICD-10-CM | POA: Diagnosis not present

## 2023-12-07 DIAGNOSIS — E559 Vitamin D deficiency, unspecified: Secondary | ICD-10-CM | POA: Diagnosis not present

## 2023-12-07 DIAGNOSIS — E1165 Type 2 diabetes mellitus with hyperglycemia: Secondary | ICD-10-CM | POA: Diagnosis not present

## 2023-12-07 DIAGNOSIS — M25561 Pain in right knee: Secondary | ICD-10-CM | POA: Diagnosis not present

## 2023-12-14 ENCOUNTER — Ambulatory Visit (HOSPITAL_BASED_OUTPATIENT_CLINIC_OR_DEPARTMENT_OTHER)
Admission: RE | Admit: 2023-12-14 | Discharge: 2023-12-14 | Disposition: A | Discharge status: Home / Self Care | Payer: Self-pay | Source: Ambulatory Visit | Attending: Family Medicine | Admitting: Family Medicine

## 2023-12-14 DIAGNOSIS — I2510 Atherosclerotic heart disease of native coronary artery without angina pectoris: Secondary | ICD-10-CM | POA: Insufficient documentation

## 2023-12-16 ENCOUNTER — Telehealth: Payer: Self-pay

## 2023-12-16 NOTE — Telephone Encounter (Signed)
 Received notification from front desk that patient left voicemail with concerns of facial edema.   Called patient back, she reports puffiness in her cheeks, along her temporal bone, and in her right eyelid. Has also noticed some swelling in her upper gums near her molars.   This has been off and on for the last 3-4 weeks. She has contacted her PCP and ENT. Is waiting on ENT to call her back.   Says she's unsure if this could be related to her bilateral temporal bone defect, has concerns regarding lupus. Is awaiting results of cardiac testing.   Denies fever or chills. Reports no changes to her typical nasal drainage.   She wanted to make Dr. Fleeta Rothman aware and see if she needed to come in for an appointment.   Will route to provider.   Danilyn Cocke, BSN, RN

## 2023-12-23 DIAGNOSIS — G9601 Cranial cerebrospinal fluid leak, spontaneous: Secondary | ICD-10-CM | POA: Diagnosis not present

## 2023-12-23 DIAGNOSIS — H9011 Conductive hearing loss, unilateral, right ear, with unrestricted hearing on the contralateral side: Secondary | ICD-10-CM | POA: Diagnosis not present

## 2023-12-23 DIAGNOSIS — R220 Localized swelling, mass and lump, head: Secondary | ICD-10-CM | POA: Diagnosis not present

## 2023-12-23 DIAGNOSIS — Q019 Encephalocele, unspecified: Secondary | ICD-10-CM | POA: Diagnosis not present

## 2024-02-26 DIAGNOSIS — E785 Hyperlipidemia, unspecified: Secondary | ICD-10-CM | POA: Diagnosis not present

## 2024-02-26 DIAGNOSIS — G9601 Cranial cerebrospinal fluid leak, spontaneous: Secondary | ICD-10-CM | POA: Diagnosis not present

## 2024-02-26 DIAGNOSIS — R220 Localized swelling, mass and lump, head: Secondary | ICD-10-CM | POA: Diagnosis not present

## 2024-02-29 ENCOUNTER — Other Ambulatory Visit: Payer: Self-pay | Admitting: Family Medicine

## 2024-02-29 DIAGNOSIS — R220 Localized swelling, mass and lump, head: Secondary | ICD-10-CM

## 2024-03-02 ENCOUNTER — Ambulatory Visit
Admission: RE | Admit: 2024-03-02 | Discharge: 2024-03-02 | Disposition: A | Discharge status: Home / Self Care | Source: Ambulatory Visit | Attending: Family Medicine | Admitting: Family Medicine

## 2024-03-02 DIAGNOSIS — R221 Localized swelling, mass and lump, neck: Secondary | ICD-10-CM | POA: Diagnosis not present

## 2024-03-02 DIAGNOSIS — R220 Localized swelling, mass and lump, head: Secondary | ICD-10-CM

## 2024-03-15 NOTE — Assessment & Plan Note (Signed)
 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 S/p left lumpectomy on 08/23/21 with Dr. Aron. 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 -Oncotype RS of 15, low risk. -she completed adjuvant radiation -she declined adjuvant AI or tamoxifen.

## 2024-03-16 ENCOUNTER — Inpatient Hospital Stay: Attending: Hematology | Admitting: Hematology

## 2024-03-16 ENCOUNTER — Other Ambulatory Visit: Payer: Self-pay | Admitting: Hematology

## 2024-03-16 VITALS — BP 118/68 | HR 78 | Temp 97.8°F | Resp 19 | Wt 261.5 lb

## 2024-03-16 DIAGNOSIS — C773 Secondary and unspecified malignant neoplasm of axilla and upper limb lymph nodes: Secondary | ICD-10-CM | POA: Insufficient documentation

## 2024-03-16 DIAGNOSIS — C50412 Malignant neoplasm of upper-outer quadrant of left female breast: Secondary | ICD-10-CM | POA: Insufficient documentation

## 2024-03-16 DIAGNOSIS — Z1721 Progesterone receptor positive status: Secondary | ICD-10-CM | POA: Insufficient documentation

## 2024-03-16 DIAGNOSIS — Z923 Personal history of irradiation: Secondary | ICD-10-CM | POA: Diagnosis not present

## 2024-03-16 DIAGNOSIS — Z170 Estrogen receptor positive status [ER+]: Secondary | ICD-10-CM | POA: Diagnosis not present

## 2024-03-16 DIAGNOSIS — F32A Depression, unspecified: Secondary | ICD-10-CM | POA: Insufficient documentation

## 2024-03-16 DIAGNOSIS — Z1732 Human epidermal growth factor receptor 2 negative status: Secondary | ICD-10-CM | POA: Insufficient documentation

## 2024-03-16 DIAGNOSIS — E669 Obesity, unspecified: Secondary | ICD-10-CM | POA: Diagnosis not present

## 2024-03-16 DIAGNOSIS — Z1231 Encounter for screening mammogram for malignant neoplasm of breast: Secondary | ICD-10-CM

## 2024-03-16 DIAGNOSIS — Z79899 Other long term (current) drug therapy: Secondary | ICD-10-CM | POA: Insufficient documentation

## 2024-03-16 DIAGNOSIS — Z853 Personal history of malignant neoplasm of breast: Secondary | ICD-10-CM

## 2024-03-16 NOTE — Progress Notes (Signed)
 [facility]   [phone] [phone]   Clinic Follow up Note   Patient Care Team: Leonel Cole, MD as PCP - General (Family Medicine) Aron Shoulders, MD as Consulting Physician (General Surgery) Lanny Callander, MD as Consulting Physician (Hematology) Izell Domino, MD as Attending Physician (Radiation Oncology) Crawford Morna Pickle, NP as Nurse Practitioner (Hematology and Oncology) Fleeta Rothman, Jomarie SAILOR, MD as Consulting Physician (Infectious Diseases)  Date of Service:  03/16/2024  CHIEF COMPLAINT: f/u of breast cancer  CURRENT THERAPY:  Cancer surveillance  Oncology History   Malignant neoplasm of upper-outer quadrant of left breast in female, estrogen receptor positive (HCC) stage IA, p(T1c, N1a ) cM0, ER+/PR+/HER2-, Grade 2, Oncotype RS 15 -presented with palpable left breast mass. S/p left lumpectomy on 08/23/21 with Dr. Aron. 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 -Oncotype RS of 15, low risk. -she completed adjuvant radiation -she declined adjuvant AI or tamoxifen.    Assessment & Plan Malignant neoplasm of left breast, upper-outer quadrant No evidence of recurrence. Recent mammogram in May 2025 was benign. Swelling and redness on the right side of the face and potential lymph node involvement are not indicative of cancer recurrence. Current symptoms are unlikely related to breast cancer. - Order annual screening mammogram for May 2026.  Depressive disorder Chronic depressive disorder with ongoing stress from family and medical issues. She is under the care of a therapist and on medication for depression. - Encourage continued therapy.  Obesity Obesity with a history of polycystic ovarian disease contributing to weight management challenges. Previous attempts with weight loss medication (Zepbound) resulted in nausea and vomiting, leading to discontinuation. Difficulty in weight  management given her medical history and current stressors.  Plan - Labs reviewed, exam unremarkable, no current concern for recurrence - Continue cancer surveillance  - She is overdue for mammogram, I ordered and asked patient to call breast center to schedule.   SUMMARY OF ONCOLOGIC HISTORY: Oncology History Overview Note   Cancer Staging  Malignant neoplasm of upper-outer quadrant of left breast in female, estrogen receptor positive (HCC) Staging form: Breast, AJCC 8th Edition - Clinical stage from 08/06/2021: Stage IA (cT1c, cN0, cM0, G2, ER+, PR+, HER2-) - Signed by Lanny Callander, MD on 08/13/2021 - Pathologic stage from 08/23/2021: Stage IA (pT1c, pN1a, cM0, G2, ER+, PR+, HER2-, Oncotype DX score: 15) - Signed by Lanny Callander, MD on 10/21/2021     Malignant neoplasm of upper-outer quadrant of left breast in female, estrogen receptor positive (HCC)  07/27/2021 Mammogram   CLINICAL DATA:  Palpable lump in the left breast.   EXAM: DIGITAL DIAGNOSTIC BILATERAL MAMMOGRAM WITH TOMOSYNTHESIS AND CAD; ULTRASOUND LEFT BREAST LIMITED  IMPRESSION: Highly suspicious left breast mass. No left axillary adenopathy. No other abnormalities.   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 Based on the biopsy, the carcinoma appears Nottingham grade 2 of 3 and measures 1.5 cm in greatest linear extent.  E-cadherin is POSITIVE supporting a  ductal origin.  PROGNOSTIC  INDICATORS Results: The tumor cells are EQUIVOCAL for Her2 (2+). Her2 by FISH will be performed and the results reported separately. Estrogen Receptor: 100%, POSITIVE, STRONG-MODERATE STAINING INTENSITY Progesterone Receptor: 100%, POSITIVE, STRONG STAINING INTENSITY Proliferation Marker Ki67: 5%  FLUORESCENCE IN-SITU HYBRIDIZATION Results: GROUP 5: HER2 **NEGATIVE**   08/12/2021 Initial Diagnosis   Malignant neoplasm of upper-outer quadrant of left breast in female, estrogen receptor positive (HCC)    Genetic Testing   [facility] CustomNext Panel was Negative. 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 Report date is 08/28/2021.  The CustomNext-Cancer+RNAinsight panel offered by [employer] includes sequencing and rearrangement analysis for the following 47 genes:  APC, ATM, AXIN2, BARD1, BMPR1A, BRCA1, BRCA2, BRIP1, CDH1, CDK4, CDKN2A, CHEK2, CTNNA1, DICER1, EPCAM, GREM1, HOXB13, KIT, MEN1, MLH1, MSH2, MSH3, MSH6, MUTYH, NBN, NF1, NTHL1, PALB2, PDGFRA, PMS2, POLD1, POLE, PTEN, RAD50, RAD51C, RAD51D, SDHA, SDHB, SDHC, SDHD, SMAD4, SMARCA4, STK11, TP53, TSC1, TSC2, and VHL.  RNA data is routinely analyzed for use in variant interpretation for all genes.   08/23/2021 Cancer Staging   Staging form: Breast, AJCC 8th Edition - Pathologic stage from 08/23/2021: Stage IA (pT1c, pN1a, cM0, G2, ER+, PR+, HER2-, Oncotype DX score: 15) - Signed by Lanny Callander, MD on 10/21/2021 Stage prefix: Initial diagnosis Multigene prognostic tests performed: Oncotype DX Recurrence score range: Greater than or equal to 11 Histologic grading system: 3 grade system Residual tumor (R): R0 - None   08/23/2021 Definitive Surgery   FINAL MICROSCOPIC DIAGNOSIS:   A. BREAST, LEFT, LUMPECTOMY:  - Invasive and in situ ductal carcinoma, 1.9 cm.  - Invasive carcinoma 0.2 cm from posterior margin and 0.3 cm from anterior margin.  - DCIS 0.1 cm from medial margin.  - Biopsy site and  biopsy clip.  - See oncology table.   B. LYMPH NODE, LEFT AXILLARY #1, SENTINEL, EXCISION:  - One lymph node negative for metastatic carcinoma (0/1).   C. LYMPH NODE, LEFT AXILLARY #2, SENTINEL, EXCISION:  - Metastatic carcinoma in one lymph node (1/1).  - Metastasis is 0.4 cm.   D. LYMPH NODE, LEFT AXILLARY #3, SENTINEL, EXCISION:  - One lymph node negative for metastatic carcinoma (0/1).   E. LYMPH NODE, LEFT AXILLARY #4, SENTINEL, EXCISION:  - One lymph node negative for metastatic carcinoma (0/1).   F. LYMPH NODE, LEFT AXILLARY #5, SENTINEL, EXCISION:  - One lymph node negative for metastatic carcinoma (0/1).    08/23/2021 Miscellaneous   Oncotype DX was obtained on the final surgical sample and the recurrence score of 15 predicts a risk of recurrence outside the breast over the next 9 years of 14%, if the patient's only systemic therapy is an antiestrogen for 5 years.  It also predicts no apparent benefit from chemotherapy.    09/25/2021 - 10/22/2021 Radiation Therapy   Site Technique Total Dose (Gy) Dose per Fx (Gy) Completed Fx Beam Energies  Breast, Left: Breast_L_Axilla 3D 40.05/40.05 2.67 15/15 10X, 15X  Breast, Left: Breast_L_Bst 3D 10/10 2 5/5 6X, 15X     10/2021 -  Anti-estrogen oral therapy   Exemestane  or other antiestrogen therapy declined by patient      Discussed the use of AI scribe software for clinical note transcription with the patient, who gave verbal consent to proceed.  History of Present Illness Lisa [name] is a 63 year old female with a history of breast cancer who presents for follow-up.  A mammogram in May 2025 at the  [facility] was negative. She has a hard lump on her arm present for over six months, evaluated with an ultrasound, but no biopsy was performed. The lump is described as a 'bump on the bone' and has not changed in size. She is concerned about potential lymph node involvement, particularly in the groin area, where she can feel  an enlarged node. There is no significant swelling in her legs, although her right knee is in poor condition.     All other systems were reviewed with the patient and are negative.  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    Meningitis 06/11/2021   bacterial meningitis   Otitis media 04/22/2022   Polycystic ovarian disease    Vaccine counseling 04/22/2022    SURGICAL HISTORY: Past Surgical History:  Procedure Laterality Date   BREAST LUMPECTOMY Left 08/2021   BREAST LUMPECTOMY WITH SENTINEL LYMPH NODE BIOPSY Left 08/23/2021   Procedure: LEFT BREAST LUMPECTOMY WITH SENTINEL LYMPH NODE BIOPSY;  Surgeon: Aron Shoulders, MD;  Location: [facility];  Service: General;  Laterality: Left;   csf leak in skull  06/10/2003   noted failed attempt to close CSF leak in skull   DILATION AND EVACUATION  06/09/1988   miscarriage   IR FLUORO GUIDED NEEDLE PLC ASPIRATION/INJECTION LOC  06/12/2021   RADIOLOGY WITH ANESTHESIA N/A 06/12/2021   Procedure: IR WITH ANESTHESIA;  Surgeon: Radiologist, Medication, MD;  Location: [facility] OR;  Service: Radiology;  Laterality: N/A;   TONSILLECTOMY AND ADENOIDECTOMY  06/10/1971    I have reviewed the social history and family history with the patient and they are unchanged from previous note.  ALLERGIES:  is allergic to statins and glimepiride.  MEDICATIONS:  Current Outpatient Medications  Medication Sig Dispense Refill   albuterol  (VENTOLIN  HFA) 108 (90 Base) MCG/ACT inhaler Inhale 2 puffs into the lungs every 6 (six) hours as needed for  wheezing. 8.5 g 0   ALPRAZolam  (XANAX ) 0.25 MG tablet Take 0.5-1 tablets (0.125-0.25 mg total) by mouth daily as needed. 20 tablet 0   amoxicillin -clavulanate (AUGMENTIN ) 875-125 MG tablet BEGIN taking at first sign of ANY sinus infection and take for 10 days 20 tablet 5   aspirin  EC 81 MG tablet Take 81 mg by mouth daily. Swallow whole.     buPROPion  (WELLBUTRIN  XL) 300 MG 24 hr tablet Take 1 tablet by mouth in the morning 90 tablet 3   Cholecalciferol (VITAMIN D3) 1.25 MG (50000 UT) CAPS Take 1 capsule by mouth once a week. (Patient not taking: Reported on 06/30/2023)     clobetasol  ointment (TEMOVATE ) 0.05 % Apply 1 application externally twice a day for 10 day(s) 60 g 2   dapagliflozin  propanediol (FARXIGA ) 5 MG TABS tablet Take 1 tablet by mouth once a day 90 tablet 3   diclofenac  (VOLTAREN ) 75 MG EC tablet Take 1 tablet (75 mg total) by mouth in the morning and at bedtime. 180 tablet 0   diclofenac   Sodium (VOLTAREN ) 1 % GEL Apply as directed externally. 100 g 5   Eszopiclone  3 MG TABS Take 1 tablet (3 mg total) by mouth at bedtime. 30 tablet 0   ezetimibe  (ZETIA ) 10 MG tablet Take 1 tablet (10 mg total) by mouth daily. 60 tablet 0   gabapentin (NEURONTIN) 100 MG capsule Take 100 mg by mouth 2 (two) times daily.     HYDROcodone -acetaminophen  (NORCO) 7.5-325 MG tablet Take 1 tablet by mouth every 6 (six) hours as needed.     insulin  glargine (LANTUS  SOLOSTAR) 100 UNIT/ML Solostar Pen Inject 10 Units into the skin daily. 15 mL 3   Insulin  Pen Needle 31G X 8 MM MISC Use as directed 100 each 2   liraglutide  (VICTOZA ) 18 MG/3ML SOPN Inject 1.2 mg into the skin daily.     metFORMIN  (GLUCOPHAGE ) 500 MG tablet Take 1 tablet by mouth in morning, and 2 tablets by mouth at night once a day 270 tablet 3   omega-3 acid ethyl esters (LOVAZA ) 1 g capsule Take 2 capsules (2 g total) by mouth 2 (two) times daily. (Patient not taking: Reported on 06/30/2023) 360 capsule 3   ondansetron  (ZOFRAN ) 4 MG tablet Take  1 tablet (4 mg total) by mouth every 8 (eight) hours as needed for nausea or vomiting. 20 tablet 0   ondansetron  (ZOFRAN ) 4 MG tablet Take 1 tablet (4 mg total) by mouth every 8 (eight) hours as needed. 30 tablet 0   Semaglutide ,0.25 or 0.5MG /DOS, (OZEMPIC , 0.25 OR 0.5 MG/DOSE,) 2 MG/3ML SOPN Inject 0.25 mg into the skin once a week. 3 mL 0   sertraline  (ZOLOFT ) 100 MG tablet Take 2 tablets by mouth once a day 180 tablet 3   spironolactone  (ALDACTONE ) 25 MG tablet Take 1 tablet (25 mg total) by mouth daily. 90 tablet 0   tiZANidine  (ZANAFLEX ) 4 MG tablet Take 1 tablet by mouth 3 times daily as needed (Patient not taking: Reported on 06/30/2023) 30 tablet 1   traMADol  (ULTRAM ) 50 MG tablet Take 1 tablet (50 mg total) by mouth 2 (two) times daily as needed. 60 tablet 0   No current facility-administered medications for this visit.    PHYSICAL EXAMINATION: ECOG PERFORMANCE STATUS:   Vitals:   03/16/24 1407  BP: 118/68  Pulse: 78  Resp: 19  Temp: 97.8 F (36.6 C)  SpO2: 99%   Wt Readings from Last 3 Encounters:  03/16/24 261 lb 8 oz (118.6 kg)  05/04/23 248 lb 2 oz (112.5 kg)  12/01/22 260 lb 8 oz (118.2 kg)     GENERAL:alert, no distress and comfortable, obese  SKIN: skin color, texture, turgor are normal, no rashes or significant lesions EYES: normal, Conjunctiva are pink and non-injected, sclera clear NECK: supple, thyroid  normal size, non-tender, without nodularity LYMPH:  no palpable lymphadenopathy in the cervical, axillary  LUNGS: clear to auscultation and percussion with normal breathing effort HEART: regular rate & rhythm and no murmurs and no lower extremity edema ABDOMEN:abdomen soft, non-tender and normal bowel sounds Musculoskeletal:no cyanosis of digits and no clubbing  NEURO: alert & oriented x 3 with fluent speech, no focal motor/sensory deficits Breasts: Breast inspection showed small left breast with mild lymphedema, no nipple discharge. Palpation of the breasts  and axilla revealed no obvious mass that I could appreciate.  Physical Exam    LABORATORY DATA:  I have reviewed the data as listed    Latest Ref Rng & Units 11/04/2022   10:18 AM 08/04/2022    2:39  PM 05/06/2022   11:18 AM  CBC  WBC 4.0 - 10.5 K/uL 7.6  7.9  6.2   Hemoglobin 12.0 - 15.0 g/dL 84.3  84.9  85.0   Hematocrit 36.0 - 46.0 % 44.4  44.6  43.3   Platelets 150 - 400 K/uL 298  276  279         Latest Ref Rng & Units 11/04/2022   10:18 AM 08/04/2022    2:39 PM 05/06/2022   11:18 AM  CMP  Glucose 70 - 99 mg/dL 854  840  858   BUN 8 - 23 mg/dL 11  12  13    Creatinine 0.44 - 1.00 mg/dL 9.08  9.25  9.19   Sodium 135 - 145 mmol/L 137  134  134   Potassium 3.5 - 5.1 mmol/L 4.4  3.9  4.4   Chloride 98 - 111 mmol/L 100  100  102   CO2 22 - 32 mmol/L 29  22  25    Calcium 8.9 - 10.3 mg/dL 9.8  9.2  9.5   Total Protein 6.5 - 8.1 g/dL 7.3  6.8  7.5   Total Bilirubin 0.3 - 1.2 mg/dL 0.3  0.4  0.3   Alkaline Phos 38 - 126 U/L 66  57  62   AST 15 - 41 U/L 13  17  13    ALT 0 - 44 U/L 15  15  15        RADIOGRAPHIC STUDIES: I have personally reviewed the radiological images as listed and agreed with the findings in the report. No results found.    No orders of the defined types were placed in this encounter.  All questions were answered. The patient knows to call the clinic with any problems, questions or concerns. No barriers to learning was detected. The total time spent in the appointment was 25 minutes, including review of chart and various tests results, discussions about plan of care and coordination of care plan     Onita Mattock, MD 03/16/2024

## 2024-03-26 DIAGNOSIS — Z23 Encounter for immunization: Secondary | ICD-10-CM | POA: Diagnosis not present

## 2024-06-06 ENCOUNTER — Other Ambulatory Visit: Payer: Self-pay | Admitting: Family Medicine

## 2024-06-06 DIAGNOSIS — R252 Cramp and spasm: Secondary | ICD-10-CM

## 2024-06-07 ENCOUNTER — Encounter: Payer: Self-pay | Admitting: Infectious Disease

## 2024-06-07 MED ORDER — AMOXICILLIN-POT CLAVULANATE 875-125 MG PO TABS: ORAL_TABLET | ORAL | 0 refills | Status: AC

## 2024-06-17 ENCOUNTER — Other Ambulatory Visit: Payer: Self-pay | Admitting: Family Medicine

## 2024-06-17 ENCOUNTER — Ambulatory Visit
Admission: RE | Admit: 2024-06-17 | Discharge: 2024-06-17 | Disposition: A | Source: Ambulatory Visit | Attending: Family Medicine | Admitting: Family Medicine

## 2024-06-17 DIAGNOSIS — R252 Cramp and spasm: Secondary | ICD-10-CM

## 2024-07-19 ENCOUNTER — Ambulatory Visit: Payer: Self-pay | Admitting: Infectious Disease

## 2024-09-21 ENCOUNTER — Ambulatory Visit: Admitting: Rheumatology

## 2024-10-14 ENCOUNTER — Encounter

## 2024-10-26 ENCOUNTER — Ambulatory Visit: Admitting: Rheumatology

## 2025-03-16 ENCOUNTER — Other Ambulatory Visit

## 2025-03-16 ENCOUNTER — Ambulatory Visit: Admitting: Adult Health
